# Patient Record
Sex: Male | Born: 1940
Health system: Southern US, Community
[De-identification: ages and names within clinical notes are randomized; demographics above are authoritative.]

## PROBLEM LIST (undated history)

## (undated) DIAGNOSIS — D126 Benign neoplasm of colon, unspecified: Secondary | ICD-10-CM

## (undated) DIAGNOSIS — K5792 Diverticulitis of intestine, part unspecified, without perforation or abscess without bleeding: Secondary | ICD-10-CM

## (undated) DIAGNOSIS — J449 Chronic obstructive pulmonary disease, unspecified: Secondary | ICD-10-CM

## (undated) DIAGNOSIS — J8410 Pulmonary fibrosis, unspecified: Secondary | ICD-10-CM

## (undated) DIAGNOSIS — Z860100 Personal history of colon polyps, unspecified: Secondary | ICD-10-CM

## (undated) DIAGNOSIS — T4145XA Adverse effect of unspecified anesthetic, initial encounter: Secondary | ICD-10-CM

## (undated) DIAGNOSIS — A0472 Enterocolitis due to Clostridium difficile, not specified as recurrent: Secondary | ICD-10-CM

## (undated) DIAGNOSIS — K449 Diaphragmatic hernia without obstruction or gangrene: Secondary | ICD-10-CM

## (undated) DIAGNOSIS — K589 Irritable bowel syndrome without diarrhea: Secondary | ICD-10-CM

## (undated) DIAGNOSIS — I340 Nonrheumatic mitral (valve) insufficiency: Secondary | ICD-10-CM

## (undated) DIAGNOSIS — N400 Enlarged prostate without lower urinary tract symptoms: Secondary | ICD-10-CM

## (undated) DIAGNOSIS — H269 Unspecified cataract: Secondary | ICD-10-CM

## (undated) DIAGNOSIS — T7840XA Allergy, unspecified, initial encounter: Secondary | ICD-10-CM

## (undated) DIAGNOSIS — I714 Abdominal aortic aneurysm, without rupture, unspecified: Secondary | ICD-10-CM

## (undated) DIAGNOSIS — M1990 Unspecified osteoarthritis, unspecified site: Secondary | ICD-10-CM

## (undated) DIAGNOSIS — M109 Gout, unspecified: Secondary | ICD-10-CM

## (undated) DIAGNOSIS — E785 Hyperlipidemia, unspecified: Secondary | ICD-10-CM

## (undated) DIAGNOSIS — T8859XA Other complications of anesthesia, initial encounter: Secondary | ICD-10-CM

## (undated) DIAGNOSIS — K219 Gastro-esophageal reflux disease without esophagitis: Secondary | ICD-10-CM

## (undated) DIAGNOSIS — K648 Other hemorrhoids: Secondary | ICD-10-CM

## (undated) DIAGNOSIS — K5790 Diverticulosis of intestine, part unspecified, without perforation or abscess without bleeding: Secondary | ICD-10-CM

## (undated) DIAGNOSIS — K222 Esophageal obstruction: Secondary | ICD-10-CM

## (undated) HISTORY — DX: Benign neoplasm of colon, unspecified: D12.6

## (undated) HISTORY — DX: Other hemorrhoids: K64.8

## (undated) HISTORY — PX: COLONOSCOPY: SHX174

## (undated) HISTORY — DX: Diverticulitis of intestine, part unspecified, without perforation or abscess without bleeding: K57.92

## (undated) HISTORY — DX: Hyperlipidemia, unspecified: E78.5

## (undated) HISTORY — DX: Allergy, unspecified, initial encounter: T78.40XA

## (undated) HISTORY — DX: Enterocolitis due to Clostridium difficile, not specified as recurrent: A04.72

## (undated) HISTORY — DX: Esophageal obstruction: K22.2

## (undated) HISTORY — DX: Unspecified osteoarthritis, unspecified site: M19.90

## (undated) HISTORY — DX: Abdominal aortic aneurysm, without rupture: I71.4

## (undated) HISTORY — DX: Personal history of colonic polyps: Z86.010

## (undated) HISTORY — DX: Personal history of colon polyps, unspecified: Z86.0100

## (undated) HISTORY — DX: Gout, unspecified: M10.9

## (undated) HISTORY — DX: Gastro-esophageal reflux disease without esophagitis: K21.9

## (undated) HISTORY — DX: Diaphragmatic hernia without obstruction or gangrene: K44.9

## (undated) HISTORY — PX: UPPER GASTROINTESTINAL ENDOSCOPY: SHX188

## (undated) HISTORY — DX: Other complications of anesthesia, initial encounter: T88.59XA

## (undated) HISTORY — DX: Abdominal aortic aneurysm, without rupture, unspecified: I71.40

## (undated) HISTORY — DX: Benign prostatic hyperplasia without lower urinary tract symptoms: N40.0

## (undated) HISTORY — DX: Chronic obstructive pulmonary disease, unspecified: J44.9

## (undated) HISTORY — DX: Diverticulosis of intestine, part unspecified, without perforation or abscess without bleeding: K57.90

## (undated) HISTORY — DX: Unspecified cataract: H26.9

## (undated) HISTORY — DX: Irritable bowel syndrome, unspecified: K58.9

## (undated) HISTORY — DX: Nonrheumatic mitral (valve) insufficiency: I34.0

## (undated) HISTORY — DX: Adverse effect of unspecified anesthetic, initial encounter: T41.45XA

---

## 2007-04-09 ENCOUNTER — Ambulatory Visit: Payer: Self-pay | Admitting: Gastroenterology

## 2008-09-02 ENCOUNTER — Ambulatory Visit: Payer: Self-pay | Admitting: Gastroenterology

## 2008-09-02 DIAGNOSIS — Z860100 Personal history of colon polyps, unspecified: Secondary | ICD-10-CM | POA: Insufficient documentation

## 2008-09-02 DIAGNOSIS — K589 Irritable bowel syndrome without diarrhea: Secondary | ICD-10-CM | POA: Insufficient documentation

## 2008-09-16 ENCOUNTER — Ambulatory Visit: Payer: Self-pay | Admitting: Gastroenterology

## 2008-09-16 DIAGNOSIS — K2970 Gastritis, unspecified, without bleeding: Secondary | ICD-10-CM | POA: Insufficient documentation

## 2008-09-16 DIAGNOSIS — K2990 Gastroduodenitis, unspecified, without bleeding: Secondary | ICD-10-CM

## 2008-09-16 LAB — CONVERTED CEMR LAB: UREASE: NEGATIVE

## 2008-10-14 ENCOUNTER — Ambulatory Visit: Payer: Self-pay | Admitting: Gastroenterology

## 2008-10-14 DIAGNOSIS — K222 Esophageal obstruction: Secondary | ICD-10-CM

## 2008-10-14 DIAGNOSIS — K219 Gastro-esophageal reflux disease without esophagitis: Secondary | ICD-10-CM | POA: Insufficient documentation

## 2008-11-05 ENCOUNTER — Telehealth: Payer: Self-pay | Admitting: Gastroenterology

## 2009-01-05 ENCOUNTER — Telehealth: Payer: Self-pay | Admitting: Gastroenterology

## 2009-02-02 ENCOUNTER — Telehealth: Payer: Self-pay | Admitting: Gastroenterology

## 2009-08-26 ENCOUNTER — Ambulatory Visit: Payer: Self-pay | Admitting: Family Medicine

## 2009-08-26 DIAGNOSIS — E1159 Type 2 diabetes mellitus with other circulatory complications: Secondary | ICD-10-CM | POA: Insufficient documentation

## 2009-08-26 DIAGNOSIS — E1140 Type 2 diabetes mellitus with diabetic neuropathy, unspecified: Secondary | ICD-10-CM | POA: Insufficient documentation

## 2009-08-26 DIAGNOSIS — I10 Essential (primary) hypertension: Secondary | ICD-10-CM

## 2009-08-26 DIAGNOSIS — R0989 Other specified symptoms and signs involving the circulatory and respiratory systems: Secondary | ICD-10-CM | POA: Insufficient documentation

## 2009-08-26 DIAGNOSIS — M159 Polyosteoarthritis, unspecified: Secondary | ICD-10-CM | POA: Insufficient documentation

## 2009-08-26 DIAGNOSIS — E1169 Type 2 diabetes mellitus with other specified complication: Secondary | ICD-10-CM | POA: Insufficient documentation

## 2009-08-26 DIAGNOSIS — I152 Hypertension secondary to endocrine disorders: Secondary | ICD-10-CM | POA: Insufficient documentation

## 2009-08-26 DIAGNOSIS — E7800 Pure hypercholesterolemia, unspecified: Secondary | ICD-10-CM | POA: Insufficient documentation

## 2009-10-18 ENCOUNTER — Inpatient Hospital Stay: Payer: Self-pay | Admitting: Internal Medicine

## 2009-10-18 ENCOUNTER — Ambulatory Visit: Payer: Self-pay | Admitting: Cardiology

## 2009-10-19 ENCOUNTER — Encounter: Payer: Self-pay | Admitting: Family Medicine

## 2009-10-20 ENCOUNTER — Encounter: Payer: Self-pay | Admitting: Family Medicine

## 2009-10-27 ENCOUNTER — Encounter: Payer: Self-pay | Admitting: Cardiovascular Disease

## 2009-10-27 ENCOUNTER — Encounter (INDEPENDENT_AMBULATORY_CARE_PROVIDER_SITE_OTHER): Payer: Self-pay | Admitting: *Deleted

## 2009-11-02 ENCOUNTER — Ambulatory Visit: Payer: Self-pay | Admitting: Family Medicine

## 2009-11-02 DIAGNOSIS — I2510 Atherosclerotic heart disease of native coronary artery without angina pectoris: Secondary | ICD-10-CM

## 2009-11-04 LAB — CONVERTED CEMR LAB
CO2: 26 meq/L (ref 19–32)
Calcium: 9.9 mg/dL (ref 8.4–10.5)
Chloride: 104 meq/L (ref 96–112)
Glucose, Bld: 104 mg/dL — ABNORMAL HIGH (ref 70–99)
Potassium: 5.1 meq/L (ref 3.5–5.1)
Sodium: 138 meq/L (ref 135–145)

## 2009-12-03 ENCOUNTER — Ambulatory Visit: Payer: Self-pay | Admitting: Family Medicine

## 2009-12-03 DIAGNOSIS — M109 Gout, unspecified: Secondary | ICD-10-CM

## 2009-12-03 DIAGNOSIS — M1009 Idiopathic gout, multiple sites: Secondary | ICD-10-CM | POA: Insufficient documentation

## 2009-12-03 LAB — CONVERTED CEMR LAB
Cholesterol, target level: 200 mg/dL
HDL goal, serum: 40 mg/dL
LDL Goal: 70 mg/dL

## 2010-01-25 ENCOUNTER — Ambulatory Visit: Payer: Self-pay | Admitting: Family Medicine

## 2010-01-25 LAB — CONVERTED CEMR LAB
Albumin: 4.2 g/dL (ref 3.5–5.2)
Alkaline Phosphatase: 58 units/L (ref 39–117)
BUN: 14 mg/dL (ref 6–23)
CO2: 28 meq/L (ref 19–32)
Calcium: 9.4 mg/dL (ref 8.4–10.5)
Cholesterol: 196 mg/dL (ref 0–200)
Creatinine, Ser: 1.4 mg/dL (ref 0.4–1.5)
Creatinine,U: 162.5 mg/dL
GFR calc non Af Amer: 53.31 mL/min (ref >60)
Glucose, Bld: 233 mg/dL — ABNORMAL HIGH (ref 70–99)
HDL: 39.3 mg/dL (ref >39.00)
Hgb A1c MFr Bld: 7.7 % — ABNORMAL HIGH (ref 4.6–6.5)
Microalb, Ur: 2 mg/dL — ABNORMAL HIGH (ref 0.0–1.9)
Sodium: 139 meq/L (ref 135–145)
Total Protein: 7.2 g/dL (ref 6.0–8.3)
Triglycerides: 500 mg/dL — ABNORMAL HIGH (ref 0.0–149.0)

## 2010-02-01 ENCOUNTER — Telehealth: Payer: Self-pay | Admitting: Family Medicine

## 2010-02-01 ENCOUNTER — Ambulatory Visit: Payer: Self-pay | Admitting: Family Medicine

## 2010-02-01 ENCOUNTER — Encounter: Payer: Self-pay | Admitting: Cardiology

## 2010-02-03 ENCOUNTER — Telehealth: Payer: Self-pay | Admitting: Family Medicine

## 2010-04-20 ENCOUNTER — Ambulatory Visit: Payer: Self-pay | Admitting: Family Medicine

## 2010-04-20 LAB — CONVERTED CEMR LAB: Hgb A1c MFr Bld: 7.8 % — ABNORMAL HIGH (ref 4.6–6.5)

## 2010-04-27 ENCOUNTER — Ambulatory Visit: Payer: Self-pay | Admitting: Family Medicine

## 2010-04-28 LAB — CONVERTED CEMR LAB
AST: 39 units/L — ABNORMAL HIGH (ref 0–37)
BUN: 16 mg/dL (ref 6–23)
Bilirubin, Direct: 0.2 mg/dL (ref 0.0–0.3)
Creatinine, Ser: 1.1 mg/dL (ref 0.4–1.5)
GFR calc non Af Amer: 71.12 mL/min (ref >60)
HDL: 33.2 mg/dL — ABNORMAL LOW (ref >39.00)
PSA: 1.58 ng/mL (ref 0.10–4.00)
Potassium: 4.9 meq/L (ref 3.5–5.1)
Total Bilirubin: 0.5 mg/dL (ref 0.3–1.2)
Total CHOL/HDL Ratio: 5
Uric Acid, Serum: 7.8 mg/dL (ref 4.0–7.8)
VLDL: 68.6 mg/dL — ABNORMAL HIGH (ref 0.0–40.0)

## 2010-05-04 ENCOUNTER — Telehealth: Payer: Self-pay | Admitting: Family Medicine

## 2010-05-14 ENCOUNTER — Telehealth: Payer: Self-pay | Admitting: Family Medicine

## 2010-05-17 ENCOUNTER — Telehealth: Payer: Self-pay | Admitting: Family Medicine

## 2010-08-03 ENCOUNTER — Ambulatory Visit: Payer: Self-pay | Admitting: Family Medicine

## 2010-08-04 LAB — CONVERTED CEMR LAB
AST: 42 units/L — ABNORMAL HIGH (ref 0–37)
Alkaline Phosphatase: 51 units/L (ref 39–117)
Chloride: 106 meq/L (ref 96–112)
GFR calc non Af Amer: 70.32 mL/min (ref >60)
Potassium: 4.2 meq/L (ref 3.5–5.1)
Sodium: 139 meq/L (ref 135–145)
Total Bilirubin: 0.7 mg/dL (ref 0.3–1.2)

## 2010-08-10 ENCOUNTER — Ambulatory Visit: Payer: Self-pay | Admitting: Family Medicine

## 2010-08-20 ENCOUNTER — Telehealth: Payer: Self-pay | Admitting: Family Medicine

## 2010-08-23 ENCOUNTER — Encounter (INDEPENDENT_AMBULATORY_CARE_PROVIDER_SITE_OTHER): Payer: Self-pay | Admitting: *Deleted

## 2010-08-24 ENCOUNTER — Encounter: Payer: Self-pay | Admitting: Family Medicine

## 2010-08-26 ENCOUNTER — Ambulatory Visit: Payer: Self-pay | Admitting: Gastroenterology

## 2010-08-30 LAB — PATHOLOGY REPORT

## 2010-09-04 LAB — HM DIABETES EYE EXAM: HM Diabetic Eye Exam: NORMAL

## 2010-10-05 HISTORY — PX: CORONARY ARTERY BYPASS GRAFT: SHX141

## 2010-11-04 ENCOUNTER — Ambulatory Visit: Payer: Self-pay | Admitting: Family Medicine

## 2010-11-05 LAB — CONVERTED CEMR LAB
Albumin: 4.3 g/dL (ref 3.5–5.2)
Alkaline Phosphatase: 61 units/L (ref 39–117)
Bilirubin, Direct: 0.1 mg/dL (ref 0.0–0.3)
Calcium: 9.4 mg/dL (ref 8.4–10.5)
GFR calc non Af Amer: 64.17 mL/min (ref >60)
Glucose, Bld: 95 mg/dL (ref 70–99)
HDL: 34.9 mg/dL — ABNORMAL LOW (ref >39.00)
Sodium: 137 meq/L (ref 135–145)
Total Bilirubin: 0.8 mg/dL (ref 0.3–1.2)
Total CHOL/HDL Ratio: 5
Triglycerides: 204 mg/dL — ABNORMAL HIGH (ref 0.0–149.0)
VLDL: 40.8 mg/dL — ABNORMAL HIGH (ref 0.0–40.0)

## 2010-11-12 ENCOUNTER — Ambulatory Visit: Payer: Self-pay | Admitting: Family Medicine

## 2011-01-04 NOTE — Progress Notes (Signed)
Summary: wants to change to lantus  Phone Note Call from Patient Call back at Home Phone [phone]   Caller: Mom Call For: Brian Lofts MD Summary of Call: Pt wants to change from lantus solostar to vials, this would be less costly for him.  [facility].  He is up to 50 units with the pen.  Also needs syringes with short needles. Initial call taken by: Marty Heck CMA,  May 04, 2010 2:09 PM  Follow-up for Phone Call        Phone Call Completed Follow-up by: Zenda Alpers CMA Deborra Medina),  May 04, 2010 3:32 PM    New/Updated Medications: LANTUS SOLOSTAR 100 UNIT/ML SOLN (INSULIN GLARGINE) 50 units daily..increase 2 units ever 2 days until at goal 80-120 ULTRA-THIN II INS SYR SHORT 30G X 5/16" 0.5 ML MISC (INSULIN SYRINGE-NEEDLE U-100) andsyringes Prescriptions: ULTRA-THIN II INS SYR SHORT 30G X 5/16" 0.5 ML MISC (INSULIN SYRINGE-NEEDLE U-100) andsyringes  #1 box x 11   Entered and Authorized by:   Brian Lofts MD   Signed by:   Brian Lofts MD on 05/04/2010   Method used:   Electronically to        [facility] [address]* (retail)       [place], [place]  [address]       Ph: [phone]       Fax: [phone]   RxID:   [id number] LANTUS SOLOSTAR 100 UNIT/ML SOLN (INSULIN GLARGINE) 50 units daily..increase 2 units ever 2 days until at goal 80-120  #3 vial x 3   Entered and Authorized by:   Brian Lofts MD   Signed by:   Brian Lofts MD on 05/04/2010   Method used:   Electronically to        [facility] [address] (retail)       [place], [place]  [address]       Ph: [phone]       Fax: [phone]   RxID:   [id number]

## 2011-01-04 NOTE — Assessment & Plan Note (Signed)
Summary: cpx/dlo   Vital Signs:  Patient profile:   70 year old male Height:      68 inches Weight:      187 pounds BMI:     28.54 Temp:     98.6 degrees F oral Pulse rate:   76 / minute Pulse rhythm:   regular BP sitting:   120 / 82  (left arm) Cuff size:   regular  Vitals Entered By: Sherrian Divers CMA Deborra Medina) (August 10, 2010 11:08 AM) CC: 30 minute exam, Lipid Management   History of Present Illness: DM poor control: Worsened from last OV. On metformin and lantus  daily.  FBS in AMs remain around 200. He is now up to 100 Units of insulin. Current ly not taking nighttime dose on metformin. Refuses Actos ..took in past did not like.  Gout: No further flares. Working on diet changes. Uric acid level has decreased from 8.8 to 7.8 at last OV..now stayed the same.   HTN, well controlle don current meds.  High cholesterol: Restarted simvastain, but using using every other day.Marland KitchenMarland KitchenLDL at goal <100 last OV  minimal leg pain back on med.   Scheduled for colonoscopy soon and eye exam in last few months.  Gets flu vaccine from [employer] work.    Lipid Management History:      Positive NCEP/ATP III risk factors include male age 64 years old or older, diabetes, HDL cholesterol less than 40, hypertension, and ASHD (either angina/prior MI/prior CABG).  Negative NCEP/ATP III risk factors include non-tobacco-user status.     Problems Prior to Update: 1)  Gout, Unspecified  (ICD-274.9) 2)  C A D  (ICD-414.00) 3)  Rales, Bibasilar  (ICD-786.7) 4)  Benign Prostatic Hypertrophy, Mild, Hx of  (ICD-V13.8) 5)  Osteoarthritis, Multiple Joints  (ICD-715.89) 6)  Diabetes Mellitus, Type II, Without Complications  (ION-629.52) 7)  Essential Hypertension, Benign  (ICD-401.1) 8)  Hypercholesterolemia  (ICD-272.0) 9)  Esophageal Stricture  (ICD-530.3) 10)  Gerd  (ICD-530.81) 11)  Gastritis  (ICD-535.50) 12)  Irritable Bowel Syndrome  (ICD-564.1) 13)  Personal Hx Colonic Polyps   (ICD-V12.72)  Current Medications (verified): 1)  Metformin Hcl 1000 Mg Tabs (Metformin Hcl) .... Take 1 Tablet By Mouth Two Times A Day 2)  Simvastatin 40 Mg Tabs (Simvastatin) .... One Tablet By Mouth Once Daily 3)  Diphenoxylate-Atropine 2.5-0.025 Mg Tabs (Diphenoxylate-Atropine) .[name].. 1 Tablet By Mouth Two Times A Day As Needed 4)  Omeprazole 40 Mg Cpdr (Omeprazole) .... One Tablet By Mouth Once Daily Hold Until Pt Calls. 5)  Tamsulosin Hcl 0.4 Mg Caps (Tamsulosin Hcl) .[name].. 1 Tab By Mouth Daily 6)  Metoprolol Tartrate 25 Mg Tabs (Metoprolol Tartrate) .... 1/2 Tab Twice A Day 7)  Prilosec Otc 20 Mg Tbec (Omeprazole Magnesium) .... One To Two Tablets Daily 8)  Aspirin 325 Mg Tabs (Aspirin) 9)  Lisinopril 5 Mg Tabs (Lisinopril) .[name].. 1 Tab By Mouth Daily 10)  Lantus 100 Unit/ml Soln (Insulin Glargine) .[name].. 100 Units, Titrate Up As Needed. 11)  Ultra-Thin Ii Pen Needles 29g X 12.81m Misc (Insulin Pen Needle) .... As Directed 12)  Ultra-Thin Ii Ins Syr Short 30g X 5/16" 0.5 Ml Misc (Insulin Syringe-Needle U-100) .... Andsyringes  Allergies (verified): No Known Drug Allergies  Past History:  Past medical, surgical, family and social histories (including risk factors) reviewed, and no changes noted (except as noted below).  Past Medical History: Reviewed history from 11/02/2009 and no changes required. CAD, s/p CABG x 3 Diabetes Adenomatous colon  polyps 2008 IBS Hyperlipidemia GERD Esophageal stricture  Past Surgical History: Reviewed history from 11/02/2009 and no changes required. CABG x 3, November, 2011. [facility]  Family History: Reviewed history from 10/20/2009 and no changes required. Mother-Deceased age 67. CHF Father-Deceased age 25. Ruptured Abdominal Aortic Aneurysm No FH of Colon Cancer: brother: alcoholic  Social History: Reviewed history from 08/26/2009 and no changes required. Occupation: Retired, Retail buyer as well as Administrator Patient has never  smoked.  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 Diet: fruits and veggies, fish, chicken, water  Review of Systems General:  Denies fatigue and fever. CV:  Denies chest pain or discomfort. Resp:  Denies sputum productive. GI:  Denies abdominal pain. GU:  Complains of nocturia; denies dysuria.  Physical Exam  General:  Well-developed,well-nourished,in no acute distress; alert,appropriate and cooperative throughout examination Eyes:  No corneal or conjunctival inflammation noted. EOMI. Perrla. Funduscopic exam benign, without hemorrhages, exudates or papilledema. Vision grossly normal. Ears:  External ear exam shows no significant lesions or deformities.  Otoscopic examination reveals clear canals, tympanic membranes are intact bilaterally without bulging, retraction, inflammation or discharge. Hearing is grossly normal bilaterally. Nose:  External nasal examination shows no deformity or inflammation. Nasal mucosa are pink and moist without lesions or exudates. Mouth:  Oral mucosa and oropharynx without lesions or exudates.  Teeth in good repair. Neck:  no carotid bruit or thyromegaly no cervical or supraclavicular lymphadenopathy  Lungs:  Normal respiratory effort, chest expands symmetrically. Crackles in bases..stable. Heart:  Normal rate and regular rhythm. S1 and S2 normal without gallop, murmur, click, rub or other extra sounds. Abdomen:  Bowel sounds positive,abdomen soft and non-tender without masses, organomegaly or hernias noted. Rectal:  No external abnormalities noted. Normal sphincter tone. No rectal masses or tenderness. Genitalia:  Testes bilaterally descended without nodularity, tenderness or masses. No scrotal masses or lesions. No penis lesions or urethral discharge. Prostate:  1+ enlarged, no nodules palpated.  Msk:  No deformity or scoliosis noted of thoracic or lumbar spine.   Pulses:  R and L  posterior tibial pulses are full and equal bilaterally  Extremities:  no edema   Diabetes Management Exam:    Foot Exam (with socks and/or shoes not present):       Sensory-Pinprick/Light touch:          Left medial foot (L-4): normal          Left dorsal foot (L-5): normal          Left lateral foot (S-1): normal          Right medial foot (L-4): normal          Right dorsal foot (L-5): normal          Right lateral foot (S-1): normal       Sensory-Monofilament:          Left foot: normal          Right foot: normal       Inspection:          Left foot: normal          Right foot: normal       Nails:          Left foot: normal          Right foot: normal   Impression & Recommendations:  Problem # 1:  DIABETES MELLITUS, TYPE II, WITHOUT COMPLICATIONS (ION-629.52) Poor  control...needs insulin sensitizers..increase metfromin back up  max. Pt currently refuses actos, but may reconsider  if remains elevated after max of metfromin. Also offer endo referral ..we can move forward with this if CBGs not imporivng.  His updated medication list for this problem includes:    Metformin Hcl 1000 Mg Tabs (Metformin hcl) .[name]... Take 1 tablet by mouth two times a day    Aspirin 325 Mg Tabs (Aspirin)    Lisinopril 5 Mg Tabs (Lisinopril) .[name]... 1 tab by mouth daily    Lantus 100 Unit/ml Soln (Insulin glargine) .Marland KitchenMarland KitchenMarland KitchenMarland Kitchen 100 units, titrate up as needed.  Problem # 2:  C A D (ICD-414.00) Stable. Asympotomatic.  His updated medication list for this problem includes:    Metoprolol Tartrate 25 Mg Tabs (Metoprolol tartrate) .[name]... 1/2 tab twice a day    Aspirin 325 Mg Tabs (Aspirin)    Lisinopril 5 Mg Tabs (Lisinopril) .[name]... 1 tab by mouth daily  Problem # 3:  ESSENTIAL HYPERTENSION, BENIGN (ICD-401.1)  Well controlled. Continue current medication.  His updated medication list for this problem includes:    Metoprolol Tartrate 25 Mg Tabs (Metoprolol tartrate) .[name]... 1/2 tab twice a day    Lisinopril 5 Mg  Tabs (Lisinopril) .[name]... 1 tab by mouth daily  BP today: 120/82 Prior BP: 110/60 (04/27/2010)  Prior 10 Yr Risk Heart Disease: N/A (12/03/2009)  Labs Reviewed: K+: 4.2 (08/03/2010) Creat: : 1.1 (08/03/2010)   Chol: 175 (04/20/2010)   HDL: 33.20 (04/20/2010)   TG: 343.0 (04/20/2010)  Problem # 4:  GOUT, UNSPECIFIED (ICD-274.9) Uric acid no longer declining...but no flares. He is not interested in allopurinol at this time. Understands risk of flares and renal stones.   Problem # 5:  PERSONAL HX COLONIC POLYPS (ICD-V12.72) Colonoscopy colon cancer screen scheudled by pt.   Problem # 6:  Preventive Health Care (ICD-V70.0) Assessment: Comment Only Reviewed preventive care protocols, scheduled due services, and updated immunizations.   Complete Medication List: 1)  Metformin Hcl 1000 Mg Tabs (Metformin hcl) .... Take 1 tablet by mouth two times a day 2)  Simvastatin 40 Mg Tabs (Simvastatin) .... One tablet by mouth once daily 3)  Diphenoxylate-atropine 2.5-0.025 Mg Tabs (Diphenoxylate-atropine) .[name].. 1 tablet by mouth two times a day as needed 4)  Omeprazole 40 Mg Cpdr (Omeprazole) .... One tablet by mouth once daily hold until pt calls. 5)  Tamsulosin Hcl 0.4 Mg Caps (Tamsulosin hcl) .[name].. 1 tab by mouth daily 6)  Metoprolol Tartrate 25 Mg Tabs (Metoprolol tartrate) .... 1/2 tab twice a day 7)  Prilosec Otc 20 Mg Tbec (Omeprazole magnesium) .... One to two tablets daily 8)  Aspirin 325 Mg Tabs (Aspirin) 9)  Lisinopril 5 Mg Tabs (Lisinopril) .[name].. 1 tab by mouth daily 10)  Lantus 100 Unit/ml Soln (Insulin glargine) .[name].. 100 units, titrate up as needed. 11)  Ultra-thin Ii Pen Needles 29g X 12.12m Misc (Insulin pen needle) .... As directed 12)  Ultra-thin Ii Ins Syr Short 30g X 5/16" 0.5 Ml Misc (Insulin syringe-needle u-100) .... Andsyringes  Other Orders: TD Toxoids IM 7 YR + (([address] Admin 1st Vaccine [phone]  Lipid Assessment/Plan:      Based on NCEP/ATP III, the patient's risk factor  category is "history of coronary disease, peripheral vascular disease, cerebrovascular disease, or aortic aneurysm along with either diabetes, current smoker, or LDL > 130 plus HDL < 40 plus triglycerides > 200".  The patient's lipid goals are as follows: Total cholesterol goal is 200; LDL cholesterol goal is 70;  HDL cholesterol goal is 40; Triglyceride goal is 150.    Patient Instructions: 1)  Go back to metformin two times a day as well as lantus. 2)  Call in 2 weeks if blood sugars not improving. 3)  Can consider actos or referral to ENDO at that time. 4)  Call insurance about shingles vaccine. 5)  Please schedule a follow-up appointment in 3 months .  6)  BMP prior to visit, ICD-9:  7)  Hepatic Panel prior to visit ICD-9:  8)  Lipid panel prior to visit ICD-9 :  9)  HgBA1c prior to visit  ICD-9:   Current Allergies (reviewed today): No known allergies   TD Result Date:  08/10/2010 TD Result:  given TD Next Due:  10 yr   Immunizations Administered:  Tetanus Vaccine:    Vaccine Type: Td    Site: right deltoid    Mfr: Sanofi Pasteur    Dose: 0.5 ml    Route: IM    Given by: Sherrian Divers CMA ([place])    Exp. Date: 01/06/2012    Lot #: [id number]    VIS given: 10/22/08 version given August 10, 2010.

## 2011-01-04 NOTE — Progress Notes (Signed)
Summary: regarding BP meds  Phone Note Call from Patient Call back at Home Phone [phone]   Caller: Spouse Call For: [name] MD Summary of Call: Pt is taking lisinopril 12.5 mg two times a day and lopressor 10 mg daily ( chart says 5 mg's daily) and his wife is asking if he is supposed to be on both, and if so, is he supposed to take 5 or 10 mg's of lopressor. Initial call taken by: Marty Heck CMA,  May 14, 2010 7:56 AM  Follow-up for Phone Call        His blood pressure was well controlled the last time he was here.  HAve her recheck...they don't make lisinopril 12.5 mg.  Needs to be on meds as on med list...bot lisinoprila nd lopressor. Follow Ps closely and call with measurments in 1 week.  Follow-up by: [name] MD,  May 14, 2010 9:13 AM  Additional Follow-up for Phone Call Additional follow up Details #1::        Patients wife advised of instructions.Cathlean Cower CMA  Additional Follow-up by: Zenda Alpers CMA ([place]),  May 14, 2010 9:30 AM     Appended Document: Orders Update BPs measured for a week...running 96-115/60-70...Marland Kitchenon lisinopril 10 mg daily and 12.5 mg two times a day of metoprolol.  Will dercrease to lisinopril 5 mg daily (this is what we thought he was on anyway)   Clinical Lists Changes  Medications: Rx of METOPROLOL TARTRATE 25 MG TABS (METOPROLOL TARTRATE) 1/2 tab twice a day;  #90 x 3;  Signed;  Entered by: [name] MD;  Authorized by: [name] MD;  Method used: Faxed to [facility], [place], [place]  [id number], Ph: [phone], Fax: [phone] Rx of LISINOPRIL 5 MG TABS (LISINOPRIL) 1 tab by mouth daily;  #90 x 3;  Signed;  Entered by: [name] MD;  Authorized by: [name] MD;  Method used: Faxed to [facility], [place], [place]  [id number], Ph: [phone], Fax: [phone]    Prescriptions: LISINOPRIL 5 MG TABS (LISINOPRIL) 1 tab by mouth daily  #90 x 3  Entered and Authorized by:   [name] MD   Signed by:   [name] MD on 05/21/2010   Method used:   Faxed to ...       [facility] (mail-order)       [place], [place]  [id number]       Ph: [phone]       Fax: [phone]   RxID:   [id number] METOPROLOL TARTRATE 25 MG TABS (METOPROLOL TARTRATE) 1/2 tab twice a day  #90 x 3   Entered and Authorized by:   [name] MD   Signed by:   [name] MD on 05/21/2010   Method used:   Faxed to ...       [facility] (mail-order)       [address] [place], [place]  [id number]       Ph: [phone]       Fax: [phone]   RxID:   [id number]

## 2011-01-04 NOTE — Progress Notes (Signed)
Summary: note for dental work  Phone Note Call from Patient   Caller: Spouse Call For: [name] MD Summary of Call: Patient needs letter stating that it is okay for him for have some dental work done. wife says patient was her a couple of weeks ago. Does he need another appt. Initial call taken by: Zenda Alpers CMA Deborra Medina),  August 20, 2010 9:25 AM  Follow-up for Phone Call        No appt neede..write letter as requested for my review and edit. THX.  Follow-up by: [name] MD,  August 20, 2010 12:29 PM  Additional Follow-up for Phone Call Additional follow up Details #1::        in inbox Additional Follow-up by: Zenda Alpers CMA Deborra Medina),  August 23, 2010 9:24 AM    Additional Follow-up for Phone Call Additional follow up Details #2::    Edited.Marland Kitchenon printer will sign.  Follow-up by: [name] MD,  August 24, 2010 8:22 AM  Additional Follow-up for Phone Call Additional follow up Details #3:: Details for Additional Follow-up Action Taken: pATIENT ADVISED LETTER READY FOR PICK UP VIA MESSAGE ON MACHINE Additional Follow-up by: Zenda Alpers CMA Deborra Medina),  August 24, 2010 8:44 AM

## 2011-01-04 NOTE — Progress Notes (Signed)
Summary:  OMEPRAZOLE  Phone Note Refill Request Message from:  Patient on February 03, 2010 4:19 PM  Refills Requested: Medication #1:  OMEPRAZOLE 40 MG CPDR one tablet by mouth once daily Patient [facility] to [facility] [phone]) Patient's humana ID is [id number]. Rx also needs to say hold until patient calls.  Initial call taken by: Lacretia Nicks,  February 03, 2010 4:36 PM Caller: Patient Call For: [name] MD    New/Updated Medications: OMEPRAZOLE 40 MG CPDR (OMEPRAZOLE) one tablet by mouth once daily HOLD until pt calls. Prescriptions: OMEPRAZOLE 40 MG CPDR (OMEPRAZOLE) one tablet by mouth once daily HOLD until pt calls.  #90 x 3   Entered and Authorized by:   [name] MD   Signed by:   [name] MD on 02/03/2010   Method used:   Faxed to ...       [facility] (mail-order)       [address] [place], [place]  [id number]       Ph: [phone]       Fax: [phone]   RxID:   [id number]

## 2011-01-04 NOTE — Letter (Signed)
Summary: [facility]  [facility]   Imported By: Zenovia Jarred 02/02/2010 09:29:37  _____________________________________________________________________  External Attachment:    Type:   Image     Comment:   External Document

## 2011-01-04 NOTE — Letter (Signed)
Summary: Generic Letter  [place] at [facility]  [address] [place], [place] [address]   Phone: [phone]  Fax: [phone]    08/23/2010  [place], [place]  [address]  To Whom it my Concern:  The above named patient is medically stable to undergo, the dental procedure that he is scheduled for.    Sincerely,   Zenda Alpers CMA ([place])

## 2011-01-04 NOTE — Letter (Signed)
Summary: Generic Letter  [place] at [facility]  [address] [place], [place] [address]   Phone: [phone]  Fax: [phone]    08/23/2010  [place], [place]  [address]  To Whom it my Concern:  The above named patient is medically stable to undergo the scheduled dental procedure. See attached for medication and health problem list. Please call if you have any questions.  Sincerely,    [name] MD

## 2011-01-04 NOTE — Assessment & Plan Note (Signed)
Summary: 3 M F/U DLO   Vital Signs:  Patient profile:   70 year old male Height:      68 inches Weight:      188.0 pounds BMI:     28.69 Temp:     98.2 degrees F oral Pulse rate:   76 / minute Pulse rhythm:   regular BP sitting:   120 / 76  (left arm) Cuff size:   regular  Vitals Entered By: Zenda Alpers CMA Deborra Medina) (November 12, 2010 11:02 AM)  History of Present Illness: Chief complaint 3 month follow up  DM poor control: Mild improvement from last OV .Brian Kitchennow taking both doses of metformin. On metformin and lantus  daily.  FBS in AMs remain around 130s. He is now up to 100 Units of insulin...all in one dose.  Before dinner 150.Brian Kitchendoes not usuall check after meals. Refuses Actos ..took in past did not like.  Gout: No further flares. Working on diet changes. Uric acid level has decreased from 8.8 to 7.8 at last OV..now stayed the same.   HTN, well controlled on current meds.  High cholesterol: Restarted simvastain, but using only once a week.Brian KitchenMarland KitchenLDL almost at goal. Triglycerides continuing to drop. Minimal leg pain back on med.   CAD, asymptomatic.   Lipid Management History:      Positive NCEP/ATP III risk factors include male age 22 years old or older, diabetes, HDL cholesterol less than 40, hypertension, and ASHD (either angina/prior MI/prior CABG).  Negative NCEP/ATP III risk factors include non-tobacco-user status.      Problems Prior to Update: 1)  Gout, Unspecified  (ICD-274.9) 2)  C A D  (ICD-414.00) 3)  Rales, Bibasilar  (ICD-786.7) 4)  Benign Prostatic Hypertrophy, Mild, Hx of  (ICD-V13.8) 5)  Osteoarthritis, Multiple Joints  (ICD-715.89) 6)  Diabetes Mellitus, Type II, Without Complications  (OIZ-124.58) 7)  Essential Hypertension, Benign  (ICD-401.1) 8)  Hypercholesterolemia  (ICD-272.0) 9)  Esophageal Stricture  (ICD-530.3) 10)  Gerd  (ICD-530.81) 11)  Gastritis  (ICD-535.50) 12)  Irritable Bowel Syndrome  (ICD-564.1) 13)  Personal Hx Colonic Polyps   (ICD-V12.72)  Allergies (verified): No Known Drug Allergies  Past History:  Past medical, surgical, family and social histories (including risk factors) reviewed, and no changes noted (except as noted below).  Past Medical History: Reviewed history from 11/02/2009 and no changes required. CAD, s/p CABG x 3 Diabetes Adenomatous colon polyps 2008 IBS Hyperlipidemia GERD Esophageal stricture  Past Surgical History: Reviewed history from 11/02/2009 and no changes required. CABG x 3, November, 2011. [facility]  Family History: Reviewed history from 10/20/2009 and no changes required. Mother-Deceased age 29. CHF Father-Deceased age 52. Ruptured Abdominal Aortic Aneurysm No FH of Colon Cancer: brother: alcoholic  Social History: Reviewed history from 08/26/2009 and no changes required. 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 Diet: fruits and veggies, fish, chicken, water  Review of Systems General:  Denies fatigue and fever. CV:  Denies chest pain or discomfort. Resp:  Denies shortness of breath. GI:  Denies abdominal pain and bloody stools. GU:  Denies dysuria; some stable weak stream.. still taking flomax.  Physical Exam  General:  Well-developed,well-nourished,in no acute distress; alert,appropriate and cooperative throughout examination Mouth:  Oral mucosa and oropharynx without lesions or exudates.  Teeth in good repair. Neck:  no carotid bruit or thyromegaly no  cervical or supraclavicular lymphadenopathy  Lungs:  Normal respiratory effort, chest expands symmetrically. Crackles in bases..stable. Heart:  Normal rate and regular rhythm. S1 and S2 normal without gallop, murmur, click, rub or other extra sounds. Abdomen:  Bowel sounds positive,abdomen soft and non-tender without masses, organomegaly or hernias  noted. Pulses:  R and L posterior tibial pulses are full and equal bilaterally  Extremities:  no edema   Diabetes Management Exam:    Foot Exam (with socks and/or shoes not present):       Sensory-Pinprick/Light touch:          Left medial foot (L-4): normal          Left dorsal foot (L-5): normal          Left lateral foot (S-1): normal          Right medial foot (L-4): normal          Right dorsal foot (L-5): normal          Right lateral foot (S-1): normal       Sensory-Monofilament:          Left foot: normal          Right foot: normal       Inspection:          Left foot: normal          Right foot: normal       Nails:          Left foot: normal          Right foot: normal    Eye Exam:       Eye Exam done elsewhere          Date: 09/04/2010          Results: nml          Done by: eye MD   Impression & Recommendations:  Problem # 1:  DIABETES MELLITUS, TYPE II, WITHOUT COMPLICATIONS (BOF-751.02) Assessment Improved  Not at goal. Split insulin dose. Continue metformin. Get on track with exercsie and diet changes.  His updated medication list for this problem includes:    Metformin Hcl 1000 Mg Tabs (Metformin hcl) .Brian Kitchen... Take 1 tablet by mouth two times a day    Aspirin 325 Mg Tabs (Aspirin)    Lisinopril 5 Mg Tabs (Lisinopril) .Brian Kitchen... 1 tab by mouth daily    Lantus 100 Unit/ml Soln (Insulin glargine) .Brian KitchenMarland KitchenMarland KitchenMarland Kitchen 100 units, titrate up as needed.  Labs Reviewed: Creat: 1.2 (11/04/2010)     Last Eye Exam: nml (09/04/2010) Reviewed HgBA1c results: 8.0 (11/04/2010)  8.7 (08/03/2010)  Problem # 2:  ESSENTIAL HYPERTENSION, BENIGN (ICD-401.1)  Well controlled. Continue current medication.  His updated medication list for this problem includes:    Metoprolol Tartrate 25 Mg Tabs (Metoprolol tartrate) .Brian Kitchen... 1/2 tab twice a day    Lisinopril 5 Mg Tabs (Lisinopril) .Brian Kitchen... 1 tab by mouth daily  BP today: 120/76 Prior BP: 120/82 (08/10/2010)  Prior 10 Yr Risk Heart Disease: N/A  (12/03/2009)  Labs Reviewed: K+: 4.7 (11/04/2010) Creat: : 1.2 (11/04/2010)   Chol: 176 (11/04/2010)   HDL: 34.90 (11/04/2010)   TG: 204.0 (11/04/2010)  Problem # 3:  HYPERCHOLESTEROLEMIA (ICD-272.0)  LDL not at goal <70.. take statin daily if able.  Fish oil daily.  His updated medication list for this problem includes:    Simvastatin 40 Mg Tabs (Simvastatin) ..... One tablet by mouth once daily  Labs Reviewed: SGOT: 34 (11/04/2010)   SGPT: 37 (11/04/2010)  Lipid Goals: Chol Goal: 200 (12/03/2009)   HDL Goal: 40 (12/03/2009)   LDL Goal: 70 (12/03/2009)   TG Goal: 150 (12/03/2009)  Prior 10 Yr Risk Heart Disease: N/A (12/03/2009)   HDL:34.90 (11/04/2010), 33.20 (04/20/2010)  Chol:176 (11/04/2010), 175 (04/20/2010)  Trig:204.0 (11/04/2010), 343.0 (04/20/2010)  Problem # 4:  C A D (ICD-414.00) Asymptomatic.  His updated medication list for this problem includes:    Metoprolol Tartrate 25 Mg Tabs (Metoprolol tartrate) .Brian Kitchen... 1/2 tab twice a day    Aspirin 325 Mg Tabs (Aspirin)    Lisinopril 5 Mg Tabs (Lisinopril) .Brian Kitchen... 1 tab by mouth daily  Problem # 5:  GOUT, UNSPECIFIED (ICD-274.9) Assessment: Unchanged Asymptomatic.   Complete Medication List: 1)  Metformin Hcl 1000 Mg Tabs (Metformin hcl) .... Take 1 tablet by mouth two times a day 2)  Simvastatin 40 Mg Tabs (Simvastatin) .... One tablet by mouth once daily 3)  Diphenoxylate-atropine 2.5-0.025 Mg Tabs (Diphenoxylate-atropine) .Brian Kitchen.. 1 tablet by mouth two times a day as needed 4)  Omeprazole 40 Mg Cpdr (Omeprazole) .... One tablet by mouth once daily hold until pt calls. 5)  Tamsulosin Hcl 0.4 Mg Caps (Tamsulosin hcl) .Brian Kitchen.. 1 tab by mouth daily 6)  Metoprolol Tartrate 25 Mg Tabs (Metoprolol tartrate) .... 1/2 tab twice a day 7)  Prilosec Otc 20 Mg Tbec (Omeprazole magnesium) .... One to two tablets daily 8)  Aspirin 325 Mg Tabs (Aspirin) 9)  Lisinopril 5 Mg Tabs (Lisinopril) .Brian Kitchen.. 1 tab by mouth daily 10)  Lantus 100 Unit/ml Soln  (Insulin glargine) .Brian Kitchen.. 100 units, titrate up as needed. 11)  Ultra-thin Ii Pen Needles 29g X 12.72m Misc (Insulin pen needle) .... As directed 12)  Ultra-thin Ii Ins Syr Short 30g X 5/16" 0.5 Ml Misc (Insulin syringe-needle u-100) .... Andsyringes  Lipid Assessment/Plan:      Based on NCEP/ATP III, the patient's risk factor category is "history of coronary disease, peripheral vascular disease, cerebrovascular disease, or aortic aneurysm along with either diabetes, current smoker, or LDL > 130 plus HDL < 40 plus triglycerides > 200".  The patient's lipid goals are as follows: Total cholesterol goal is 200; LDL cholesterol goal is 70; HDL cholesterol goal is 40; Triglyceride goal is 150.  His LDL cholesterol goal has not been met.    Patient Instructions: 1)  Spilt lantus dose to 50 Units two times a day instead of all at once. 2)   Take simvastatin every day.  3)   Check instead 2 hours after meals as well as fasting blood sugars.. bring in measurements to next appt. 4)   Increase exercise..Brian Kitchen-5 times a week. 5)  Please schedule a follow-up appointment in 3 months .  6)   HgBA1c prior to visit  ICD-9: 250.00 7)  Lipid panel prior to visit ICD-9 :    Orders Added: 1)  Est. Patient Level IV [[95747]   Current Allergies (reviewed today): No known allergies   Flu Vaccine Result Date:  11/12/2010 Flu Vaccine Result:  given Flu Vaccine Next Due:  1 yr Herpes Zoster Next Due:  Refused

## 2011-01-04 NOTE — Assessment & Plan Note (Signed)
Summary: 3 M F/U 30 PER MD/DLO   Vital Signs:  Patient profile:   70 year old male Height:      68 inches Weight:      185.8 pounds BMI:     28.35 Temp:     97.8 degrees F oral Pulse rate:   80 / minute Pulse rhythm:   regular BP sitting:   110 / 60  (left arm) Cuff size:   regular  Vitals Entered By: Zenda Alpers CMA Deborra Medina) (Apr 27, 2010 9:12 AM)  History of Present Illness: Chief complaint 3 month follow up   DM, inadequate control: Recent insulin start ...continues metformin and glipizide. Currently using lantus  40 UNITS Fasting blood sugars 170-220.... 2 hours after meals  220. Rare low CBGs if skips a meal.  High cholesterol: Restarted simvastain, but using using every other day.Marland KitchenMarland KitchenLDL now at goal <100 minimal leg pain back on med.   Gout..has made diet changes to lower uric acid. Uric acid has dropped one point.   Problems Prior to Update: 1)  Gout, Unspecified  (ICD-274.9) 2)  C A D  (ICD-414.00) 3)  Rales, Bibasilar  (ICD-786.7) 4)  Benign Prostatic Hypertrophy, Mild, Hx of  (ICD-V13.8) 5)  Osteoarthritis, Multiple Joints  (ICD-715.89) 6)  Diabetes Mellitus, Type II, Without Complications  (TLX-726.20) 7)  Essential Hypertension, Benign  (ICD-401.1) 8)  Hypercholesterolemia  (ICD-272.0) 9)  Abdominal Pain-epigastric  (ICD-789.06) 10)  Esophageal Stricture  (ICD-530.3) 11)  Gerd  (ICD-530.81) 12)  Gastritis  (ICD-535.50) 13)  Irritable Bowel Syndrome  (ICD-564.1) 14)  Personal Hx Colonic Polyps  (ICD-V12.72) 15)  Other Dysphagia  (ICD-787.29) 16)  Abdominal Pain, Epigastric  (ICD-789.06)  Current Medications (verified): 1)  Metformin Hcl 1000 Mg Tabs (Metformin Hcl) .... Take 1 Tablet By Mouth Two Times A Day 2)  Simvastatin 40 Mg Tabs (Simvastatin) .... One Tablet By Mouth Once Daily 3)  Diphenoxylate-Atropine 2.5-0.025 Mg Tabs (Diphenoxylate-Atropine) .[name].. 1 Tablet By Mouth Two Times A Day As Needed 4)  Omeprazole 40 Mg Cpdr (Omeprazole) .... One  Tablet By Mouth Once Daily Hold Until Pt Calls. 5)  Tamsulosin Hcl 0.4 Mg Caps (Tamsulosin Hcl) .[name].. 1 Tab By Mouth Daily 6)  Metoprolol Tartrate 25 Mg Tabs (Metoprolol Tartrate) .... 1/2 Tab Twice A Day 7)  Prilosec Otc 20 Mg Tbec (Omeprazole Magnesium) .... One To Two Tablets Daily 8)  Aspirin 325 Mg Tabs (Aspirin) 9)  Lisinopril 5 Mg Tabs (Lisinopril) .[name].. 1 Tab By Mouth Daily 10)  Lantus Solostar 100 Unit/ml Soln (Insulin Glargine) .... 40 Units Daily..increase 2 Units Ever 2 Days Until At Goal 80-120 11)  Ultra-Thin Ii Pen Needles 29g X 12.23m Misc (Insulin Pen Needle) .... As Directed  Allergies (verified): No Known Drug Allergies  Past History:  Past medical, surgical, family and social histories (including risk factors) reviewed, and no changes noted (except as noted below).  Past Medical History: Reviewed history from 11/02/2009 and no changes required. CAD, s/p CABG x 3 Diabetes Adenomatous colon polyps 2008 IBS Hyperlipidemia GERD Esophageal stricture  Past Surgical History: Reviewed history from 11/02/2009 and no changes required. [facility] Family History: Reviewed history from 10/20/2009 and no changes required. Mother-Deceased age 71 CHF Father-Deceased age 71 Ruptured Abdominal Aortic Aneurysm No FH of Colon Cancer: brother: alcoholic  Social History: Reviewed history from 08/26/2009 and no changes required. 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 Diet: fruits and veggies, fish, chicken, water  Review of Systems General:  Denies fatigue and fever. CV:  Denies chest pain or discomfort. Resp:  Denies shortness of breath. GI:  Denies abdominal pain. GU:  Denies dysuria.  Physical Exam  General:  Well-developed,well-nourished,in no acute distress; alert,appropriate  and cooperative throughout examination Mouth:  Oral mucosa and oropharynx without lesions or exudates.  Teeth in good repair. Neck:  no carotid bruit or thyromegaly no cervical or supraclavicular lymphadenopathy  Lungs:  Normal respiratory effort, chest expands symmetrically. Crackles in bases..stable. Heart:  Normal rate and regular rhythm. S1 and S2 normal without gallop, murmur, click, rub or other extra sounds. Abdomen:  Bowel sounds positive,abdomen soft and non-tender without masses, organomegaly or hernias noted. Pulses:  R and L posterior tibial pulses are full and equal bilaterally  Extremities:  no edema, well healing left vien harvest slight redness and tenderness at left dorsal foot, no MCP pain 1st digit  Diabetes Management Exam:    Foot Exam (with socks and/or shoes not present):       Sensory-Pinprick/Light touch:          Left medial foot (L-4): normal          Left dorsal foot (L-5): normal          Left lateral foot (S-1): normal          Right medial foot (L-4): normal          Right dorsal foot (L-5): normal          Right lateral foot (S-1): normal       Sensory-Monofilament:          Left foot: normal          Right foot: normal       Inspection:          Left foot: normal          Right foot: normal       Nails:          Left foot: normal          Right foot: normal   Impression & Recommendations:  Problem # 1:  DIABETES MELLITUS, TYPE II, WITHOUT COMPLICATIONS (QIO-962.95)  Minimal improvement..significant insulin resistance..continue to titrate up further. MAy consider treating with levemir if conti nued minimal response.  Also needs to add exercsie to increase insulin sensitivity.  The following medications were removed from the medication list:    Glipizide 10 Mg Tabs (Glipizide) .[name]... 2 tablet by mouth two times a day His updated medication list for this problem includes:    Metformin Hcl 1000 Mg Tabs (Metformin hcl) .[name]... Take 1 tablet by mouth two  times a day    Aspirin 325 Mg Tabs (Aspirin)    Lisinopril 5 Mg Tabs (Lisinopril) .[name]... 1 tab by mouth daily    Lantus Solostar 100 Unit/ml Soln (Insulin glargine) .Marland KitchenMarland KitchenMarland KitchenMarland Kitchen 40 units daily..increase 2 units ever 2 days until at goal 80-120  Labs Reviewed: Creat: 1.4 (01/25/2010)     Last Eye Exam: normal (12/05/2008) Reviewed HgBA1c results: 7.8 (04/20/2010)  7.7 (01/25/2010)  Problem # 2:  HYPERCHOLESTEROLEMIA (ICD-272.0)  Improved control but not at goal LDL ,70..take simvastain every day. Recheck fasting LIPIDS, AST, ALT  in 3 months Dx 272.0    His updated medication list for this problem includes:    Simvastatin 40 Mg Tabs (Simvastatin) .Marland KitchenMarland KitchenMarland KitchenMarland Kitchen  One tablet by mouth once daily  Labs Reviewed: SGOT: 35 (01/25/2010)   SGPT: 46 (01/25/2010)  Lipid Goals: Chol Goal: 200 (12/03/2009)   HDL Goal: 40 (12/03/2009)   LDL Goal: 70 (12/03/2009)   TG Goal: 150 (12/03/2009)  Prior 10 Yr Risk Heart Disease: N/A (12/03/2009)   HDL:39.30 (01/25/2010)  Chol:196 (01/25/2010)  Trig:500.0 (01/25/2010)  Problem # 3:  ESSENTIAL HYPERTENSION, BENIGN (ICD-401.1) Well controlled. Continue current medication.  His updated medication list for this problem includes:    Metoprolol Tartrate 25 Mg Tabs (Metoprolol tartrate) .[name]... 1/2 tab twice a day    Lisinopril 5 Mg Tabs (Lisinopril) .[name]... 1 tab by mouth daily  BP today: 110/60 Prior BP: 130/80 (02/01/2010)  Prior 10 Yr Risk Heart Disease: N/A (12/03/2009)  Labs Reviewed: K+: 4.8 (01/25/2010) Creat: : 1.4 (01/25/2010)   Chol: 196 (01/25/2010)   HDL: 39.30 (01/25/2010)   TG: 500.0 (01/25/2010)  Problem # 4:  GOUT, UNSPECIFIED (ICD-274.9) Improved control with diet changes. Recheck in 3 months..if remains above 7 ..start allopurinol.   Complete Medication List: 1)  Metformin Hcl 1000 Mg Tabs (Metformin hcl) .... Take 1 tablet by mouth two times a day 2)  Simvastatin 40 Mg Tabs (Simvastatin) .... One tablet by mouth once daily 3)  Diphenoxylate-atropine  2.5-0.025 Mg Tabs (Diphenoxylate-atropine) .[name].. 1 tablet by mouth two times a day as needed 4)  Omeprazole 40 Mg Cpdr (Omeprazole) .... One tablet by mouth once daily hold until pt calls. 5)  Tamsulosin Hcl 0.4 Mg Caps (Tamsulosin hcl) .[name].. 1 tab by mouth daily 6)  Metoprolol Tartrate 25 Mg Tabs (Metoprolol tartrate) .... 1/2 tab twice a day 7)  Prilosec Otc 20 Mg Tbec (Omeprazole magnesium) .... One to two tablets daily 8)  Aspirin 325 Mg Tabs (Aspirin) 9)  Lisinopril 5 Mg Tabs (Lisinopril) .[name].. 1 tab by mouth daily 10)  Lantus Solostar 100 Unit/ml Soln (Insulin glargine) .... 40 units daily..increase 2 units ever 2 days until at goal 80-120 11)  Ultra-thin Ii Pen Needles 29g X 12.7m Misc (Insulin pen needle) .... As directed  Patient Instructions: 1)  Make sure to take simvastatin EVERY DAY. 2)  Continue to titrate up Lantus 2 Units every 2 days...until at about 60 Units..Marland Kitchenf no improvement 3)  Please schedule a follow-up appointment in 3 months, CPX.  4)  BMP prior to visit, ICD-9: 250.00 5)  Hepatic Panel prior to visit ICD-9:  6)  HgBA1c prior to visit  ICD-9:  7)  Uric acid Dx 274.9  Current Allergies (reviewed today): No known allergies

## 2011-01-04 NOTE — Assessment & Plan Note (Signed)
Summary: 2 M F/U DLO   Vital Signs:  Patient profile:   70 year old male Height:      68 inches Weight:      191.2 pounds BMI:     29.18 Temp:     98.1 degrees F oral Pulse rate:   80 / minute Pulse rhythm:   regular BP sitting:   130 / 80  (left arm) Cuff size:   regular  Vitals Entered By: Zenda Alpers CMA Deborra Medina) (February 01, 2010 11:07 AM)  History of Present Illness: Chief complaint 2 month follow up  DM, poor control: Stopped actos given concern of SE. On metfromin and glipized max. Fasting blood sugars 200-220.. 2 hours after meals  120.  High cholesterol: Stopped simvastatin x 3 months 40 mg daily due to right thigh pain. Willing to restart.   Resolved foot pain due to gout.  Problems Prior to Update: 1)  Gout, Unspecified  (ICD-274.9) 2)  C A D  (ICD-414.00) 3)  Rales, Bibasilar  (ICD-786.7) 4)  Benign Prostatic Hypertrophy, Mild, Hx of  (ICD-V13.8) 5)  Osteoarthritis, Multiple Joints  (ICD-715.89) 6)  Diabetes Mellitus, Type II, Without Complications  (HUD-149.70) 7)  Essential Hypertension, Benign  (ICD-401.1) 8)  Hypercholesterolemia  (ICD-272.0) 9)  Abdominal Pain-epigastric  (ICD-789.06) 10)  Esophageal Stricture  (ICD-530.3) 11)  Gerd  (ICD-530.81) 12)  Gastritis  (ICD-535.50) 13)  Irritable Bowel Syndrome  (ICD-564.1) 14)  Personal Hx Colonic Polyps  (ICD-V12.72) 15)  Other Dysphagia  (ICD-787.29) 16)  Abdominal Pain, Epigastric  (ICD-789.06)  Current Medications (verified): 1)  Metformin Hcl 1000 Mg Tabs (Metformin Hcl) .... Take 1 Tablet By Mouth Two Times A Day 2)  Simvastatin 40 Mg Tabs (Simvastatin) .... One Tablet By Mouth Once Daily 3)  Glipizide 10 Mg Tabs (Glipizide) .... 2 Tablet By Mouth Two Times A Day 4)  Diphenoxylate-Atropine 2.5-0.025 Mg Tabs (Diphenoxylate-Atropine) .[name].. 1 Tablet By Mouth Two Times A Day As Needed 5)  Omeprazole 40 Mg Cpdr (Omeprazole) .... One Tablet By Mouth Once Daily 6)  Tamsulosin Hcl 0.4 Mg Caps  (Tamsulosin Hcl) .[name].. 1 Tab By Mouth Daily 7)  Metoprolol Tartrate 25 Mg Tabs (Metoprolol Tartrate) .... 1/2 Tab Twice A Day 8)  Prilosec Otc 20 Mg Tbec (Omeprazole Magnesium) .... One To Two Tablets Daily 9)  Aspirin 325 Mg Tabs (Aspirin) 10)  Lisinopril 5 Mg Tabs (Lisinopril) .[name].. 1 Tab By Mouth Daily 11)  Lantus Solostar 100 Unit/ml Soln (Insulin Glargine) .[name].. 10 Units Daily..increase 2 Units Ever 2 Days Until At Goal 80-120 12)  Ultra-Thin Ii Pen Needles 29g X 12.27m Misc (Insulin Pen Needle) .... As Directed  Allergies (verified): No Known Drug Allergies  Past History:  Past medical, surgical, family and social histories (including risk factors) reviewed, and no changes noted (except as noted below).  Past Medical History: Reviewed history from 11/02/2009 and no changes required. CAD, s/p CABG x 3 Diabetes Adenomatous colon polyps 2008 IBS Hyperlipidemia GERD Esophageal stricture  Past Surgical History: Reviewed history from 11/02/2009 and no changes required. [facility]: Reviewed history from 10/20/2009 and no changes required. Mother-Deceased age 71 CHF Father-Deceased age 71 Ruptured Abdominal Aortic Aneurysm No FH of Colon Cancer: brother: alcoholic  Social History: Reviewed history from 08/26/2009 and no changes required. 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 Diet: fruits and veggies, fish, chicken, water  Review of Systems General:  Denies fatigue and fever. CV:  Denies chest pain or discomfort. Resp:  Denies shortness of breath. GI:  Denies abdominal pain. GU:  Denies dysuria.  Physical Exam  General:  overweight appearing male in NAD Mouth:  MMM Neck:  no carotid bruit or thyromegaly no cervical or supraclavicular lymphadenopathy  Lungs:   Normal respiratory effort, chest expands symmetrically. Crackles in bases..stable. Heart:  Normal rate and regular rhythm. S1 and S2 normal without gallop, murmur, click, rub or other extra sounds. Pulses:  R and L posterior tibial pulses are full and equal bilaterally  Extremities:  no edema, well healing left vien harvest slight redness and tenderness at left dorsal foot, no MCP pain 1st digit  Diabetes Management Exam:    Foot Exam (with socks and/or shoes not present):       Sensory-Pinprick/Light touch:          Left medial foot (L-4): normal          Left dorsal foot (L-5): normal          Left lateral foot (S-1): normal          Right medial foot (L-4): normal          Right dorsal foot (L-5): normal          Right lateral foot (S-1): normal       Sensory-Monofilament:          Left foot: normal          Right foot: normal       Inspection:          Left foot: normal          Right foot: normal       Nails:          Left foot: normal          Right foot: normal   Impression & Recommendations:  Problem # 1:  DIABETES MELLITUS, TYPE II, WITHOUT COMPLICATIONS (XTA-569.79)  very poor control. Need much tighter control epecially given CAD. Will start insulin.Marland Kitchenspent 30 min with pt in coordination of care and in face to face counseling  for medicaiton initiation.  Stop glipizide, continue metformin. Close follow up. [pt to titrate insulin up at home to goal FBS 80-120.  The following medications were removed from the medication list:    Actos 30 Mg Tabs (Pioglitazone hcl) .[name]... Take 1 tablet by mouth once daily His updated medication list for this problem includes:    Metformin Hcl 1000 Mg Tabs (Metformin hcl) .[name]... Take 1 tablet by mouth two times a day    Glipizide 10 Mg Tabs (Glipizide) .[name]... 2 tablet by mouth two times a day    Aspirin 325 Mg Tabs (Aspirin)    Lisinopril 5 Mg Tabs (Lisinopril) .[name]... 1 tab by mouth daily    Lantus Solostar 100 Unit/ml Soln (Insulin  glargine) .Marland KitchenMarland KitchenMarland KitchenMarland Kitchen 10 units daily..increase 2 units ever 2 days until at goal 80-120  Labs Reviewed: Creat: 1.4 (01/25/2010)     Last Eye Exam: normal (12/05/2008) Reviewed HgBA1c results: 7.7 (01/25/2010)  7.0 (11/02/2009)  Problem # 2:  HYPERCHOLESTEROLEMIA (ICD-272.0)  Restart simvastinatin His updated medication list for this problem includes:    Simvastatin 40 Mg Tabs (Simvastatin) ..... One tablet by mouth once daily  Labs Reviewed: SGOT: 35 (01/25/2010)   SGPT: 46 (01/25/2010)  Lipid Goals: Chol Goal: 200 (12/03/2009)  HDL Goal: 40 (12/03/2009)   LDL Goal: 70 (12/03/2009)   TG Goal: 150 (12/03/2009)  Prior 10 Yr Risk Heart Disease: N/A (12/03/2009)   HDL:39.30 (01/25/2010)  Chol:196 (01/25/2010)  Trig:500.0 (01/25/2010)  Problem # 3:  GOUT, UNSPECIFIED (ICD-274.9)  Elevated uric acid level. Given other new med starts will hold off but plan on starting allopurinol for gout prevention if unable to lower with diet change. info given on high uric acid food and drinks.   Complete Medication List: 1)  Metformin Hcl 1000 Mg Tabs (Metformin hcl) .... Take 1 tablet by mouth two times a day 2)  Simvastatin 40 Mg Tabs (Simvastatin) .... One tablet by mouth once daily 3)  Glipizide 10 Mg Tabs (Glipizide) .... 2 tablet by mouth two times a day 4)  Diphenoxylate-atropine 2.5-0.025 Mg Tabs (Diphenoxylate-atropine) .[name].. 1 tablet by mouth two times a day as needed 5)  Omeprazole 40 Mg Cpdr (Omeprazole) .... One tablet by mouth once daily hold until pt calls. 6)  Tamsulosin Hcl 0.4 Mg Caps (Tamsulosin hcl) .[name].. 1 tab by mouth daily 7)  Metoprolol Tartrate 25 Mg Tabs (Metoprolol tartrate) .... 1/2 tab twice a day 8)  Prilosec Otc 20 Mg Tbec (Omeprazole magnesium) .... One to two tablets daily 9)  Aspirin 325 Mg Tabs (Aspirin) 10)  Lisinopril 5 Mg Tabs (Lisinopril) .[name].. 1 tab by mouth daily 11)  Lantus Solostar 100 Unit/ml Soln (Insulin glargine) .[name].. 10 units daily..increase 2 units ever 2  days until at goal 80-120 12)  Ultra-thin Ii Pen Needles 29g X 12.30m Misc (Insulin pen needle) .... As directed  Patient Instructions: 1)  Restart simvastatin..Marland Kitchenf pain recurs decrease to 1/2 tab by mouth daily.  2)  Please schedule a follow-up appointment in 3 months  30 min 3)  BMP prior to visit, ICD-9: 250.00 4)  Hepatic Panel prior to visit ICD-9:  5)  Lipid panel prior to visit ICD-9 :  6)  HgBA1c prior to visit  ICD-9:  7)  Uric acid Dx  8)  Stop glipizide 9)  Check fasting blood sugar..first in AMaywoodfood..Marland Kitchenoal 80-120. 10)  Then once daily check 2 hours after a meal...goal <180. 11)  Start inslin 10 UNits in AM. 12)  MAke sure taking lisinopril.  Prescriptions: ULTRA-THIN II PEN NEEDLES 29G X 12.7MM MISC (INSULIN PEN NEEDLE) as directed  #90 x 3   Entered by:   HZenda AlpersCMA (AMartin   Authorized by:   AEliezer LoftsMD   Signed by:   HZenda AlpersCMA (ASuperior on 02/01/2010   Method used:   Re-Faxed to ...       [facility] ([facility]-order)       [place] [id number]      Ph: [phone]      Fax: [phone]  RxID:   [id number] SOLOSTAR 100 UNIT/ML SOLN (INSULIN GLARGINE) 10 units daily..increase 2 units ever 2 days until at goal 80-120  #1 box x 3   Entered and Authorized by:   AEliezer LoftsMD   Signed by:   AEliezer LoftsMD on 02/01/2010   Method used:   [facility] to        [facility] (retail)       [place]  [address]      Ph: [phone]      Fax: [phone]  RxID:   [id number]   Current Allergies (reviewed today): No known allergies

## 2011-01-04 NOTE — Progress Notes (Signed)
Summary: pt needs vials of lantus  Phone Note From Pharmacy Message from:  Fax from Pharmacy  Caller: [facility] Summary of Call: Pt wants to change from lantus solostar to vials but we ( including myself) keep sending the solostar to pharmacies.  Form from [facility] asking for clarification is on your desk. Initial call taken by: Marty Heck CMA,  May 17, 2010 3:14 PM     Appended Document: Orders Update    Clinical Lists Changes  Medications: Changed medication from LANTUS SOLOSTAR 100 UNIT/ML SOLN (INSULIN GLARGINE) 50 units daily..increase 2 units ever 2 days until at goal 80-120 to LANTUS 100 UNIT/ML SOLN (INSULIN GLARGINE) 50 units, titrate up as needed.

## 2011-01-04 NOTE — Progress Notes (Signed)
Summary: needs scripts faxed  Phone Note Call from Patient Call back at Home Phone [phone]   Caller: Spouse Summary of Call: [name] wants to get pt's insulin and his pens through [facility].  It looks like the pen needles have already been sent in , but the insulin was sent to [facility].  Please advise.  Scripts faxed to [facility] have to have his human[employer] ID number, [id number].  Fax number is [phone].  Scripts also need to say to hold until pt calls, pt doesnt need these yet. Initial call taken by: Marty Heck CMA,  February 01, 2010 4:36 PM  Follow-up for Phone Call        Can you fix this Heather..Thanks. Follow-up by: [name] MD,  February 02, 2010 8:21 AM    Prescriptions: LANTUS SOLOSTAR 100 UNIT/ML SOLN (INSULIN GLARGINE) 10 units daily..increase 2 units ever 2 days until at goal 80-120  #1 box x 3   Entered by:   Zenda Alpers CMA ([place])   Authorized by:   [name] MD   Signed by:   Zenda Alpers CMA ([place]) on 02/02/2010   Method used:   Faxed to ...       [facility] (mail-order)       [address] [place], [place]  [id number]       Ph: [phone]       Fax: [phone]   RxID:   [id number]

## 2011-02-01 ENCOUNTER — Other Ambulatory Visit: Payer: Self-pay | Admitting: Family Medicine

## 2011-02-01 ENCOUNTER — Other Ambulatory Visit (INDEPENDENT_AMBULATORY_CARE_PROVIDER_SITE_OTHER): Payer: Medicare PPO

## 2011-02-01 ENCOUNTER — Telehealth (INDEPENDENT_AMBULATORY_CARE_PROVIDER_SITE_OTHER): Payer: Self-pay | Admitting: *Deleted

## 2011-02-01 ENCOUNTER — Encounter (INDEPENDENT_AMBULATORY_CARE_PROVIDER_SITE_OTHER): Payer: Self-pay | Admitting: *Deleted

## 2011-02-01 DIAGNOSIS — E7800 Pure hypercholesterolemia, unspecified: Secondary | ICD-10-CM

## 2011-02-01 DIAGNOSIS — E119 Type 2 diabetes mellitus without complications: Secondary | ICD-10-CM

## 2011-02-01 DIAGNOSIS — Z125 Encounter for screening for malignant neoplasm of prostate: Secondary | ICD-10-CM

## 2011-02-01 DIAGNOSIS — E785 Hyperlipidemia, unspecified: Secondary | ICD-10-CM

## 2011-02-01 LAB — LIPID PANEL
HDL: 36.8 mg/dL — ABNORMAL LOW (ref >39.00)
Total CHOL/HDL Ratio: 5
Triglycerides: 355 mg/dL — ABNORMAL HIGH (ref 0.0–149.0)

## 2011-02-01 LAB — HEMOGLOBIN A1C: Hgb A1c MFr Bld: 8.6 % — ABNORMAL HIGH (ref 4.6–6.5)

## 2011-02-08 ENCOUNTER — Ambulatory Visit (INDEPENDENT_AMBULATORY_CARE_PROVIDER_SITE_OTHER): Payer: Medicare PPO | Admitting: Family Medicine

## 2011-02-08 ENCOUNTER — Encounter: Payer: Self-pay | Admitting: Family Medicine

## 2011-02-08 DIAGNOSIS — I10 Essential (primary) hypertension: Secondary | ICD-10-CM

## 2011-02-08 DIAGNOSIS — E7800 Pure hypercholesterolemia, unspecified: Secondary | ICD-10-CM

## 2011-02-08 DIAGNOSIS — E119 Type 2 diabetes mellitus without complications: Secondary | ICD-10-CM

## 2011-02-08 LAB — HM DIABETES FOOT EXAM

## 2011-02-10 NOTE — Progress Notes (Signed)
----  Converted from flag ---- ---- 02/01/2011 9:02 AM, [name] MD wrote: Yes Dx v76.44  ---- 02/01/2011 8:50 AM, Daralene Milch CMA (AAMA) wrote: pt request to have psa added to his labs today, is this ok? Thanks  Tasha ------------------------------

## 2011-02-15 NOTE — Assessment & Plan Note (Signed)
Summary: 3 m f/o DLO   Vital Signs:  Patient profile:   70 year old male Height:      68 inches Weight:      189 pounds BMI:     28.84 Temp:     98.1 degrees F oral Pulse rate:   76 / minute Pulse rhythm:   regular BP sitting:   110 / 70  (left arm) Cuff size:   regular  Vitals Entered By: Zenda Alpers CMA Deborra Medina) (February 08, 2011 10:54 AM)  History of Present Illness: Chief complaint 3 month follow up   DM poor control: Worsened control from last OV .Marland Kitchennow taking both doses of metformin. On metformin and lantus  daily.  No benefit despite split dosing. FBS in AMs remain around 130s. He is now up to 100 Units of insulin. Before dinner 150.Marland Kitchendoes not usually check after meals. Refuses Actos ..took in past did not like.  HAs noted when less active blood sugars up.  Gout: No further flares. Working on diet changes. Uric acid level has decreased from 8.8 to 7.8 at last OV..now stayed the same.   HTN, well controlled on current meds.  High cholesterol: HAs not been taking simvastatins .Marland KitchenMarland KitchenLDL almost at goal but worse than last check.  Triglycerides back up... likely due to DM being worse controlled on off statin.  Minimal leg pain back on med.   CAD, asymptomatic.   Problems Prior to Update: 1)  Special Screening Malignant Neoplasm of Prostate  (ICD-V76.44) 2)  Gout, Unspecified  (ICD-274.9) 3)  C A D  (ICD-414.00) 4)  Rales, Bibasilar  (ICD-786.7) 5)  Benign Prostatic Hypertrophy, Mild, Hx of  (ICD-V13.8) 6)  Osteoarthritis, Multiple Joints  (ICD-715.89) 7)  Diabetes Mellitus, Type II, Without Complications  (ZGY-174.94) 8)  Essential Hypertension, Benign  (ICD-401.1) 9)  Hypercholesterolemia  (ICD-272.0) 10)  Esophageal Stricture  (ICD-530.3) 11)  Gerd  (ICD-530.81) 12)  Gastritis  (ICD-535.50) 13)  Irritable Bowel Syndrome  (ICD-564.1) 14)  Personal Hx Colonic Polyps  (ICD-V12.72)  Current Medications (verified): 1)  Metformin Hcl 1000 Mg Tabs (Metformin Hcl) ....  Take 1 Tablet By Mouth Two Times A Day 2)  Simvastatin 40 Mg Tabs (Simvastatin) .... One Tablet By Mouth Once Daily 3)  Diphenoxylate-Atropine 2.5-0.025 Mg Tabs (Diphenoxylate-Atropine) .[name].. 1 Tablet By Mouth Two Times A Day As Needed 4)  Omeprazole 40 Mg Cpdr (Omeprazole) .... One Tablet By Mouth Once Daily Hold Until Pt Calls. 5)  Tamsulosin Hcl 0.4 Mg Caps (Tamsulosin Hcl) .[name].. 1 Tab By Mouth Daily 6)  Metoprolol Tartrate 25 Mg Tabs (Metoprolol Tartrate) .... 1/2 Tab Twice A Day 7)  Prilosec Otc 20 Mg Tbec (Omeprazole Magnesium) .... One To Two Tablets Daily 8)  Aspirin 325 Mg Tabs (Aspirin) 9)  Lisinopril 5 Mg Tabs (Lisinopril) .[name].. 1 Tab By Mouth Daily 10)  Lantus 100 Unit/ml Soln (Insulin Glargine) .[name].. 100 Units, Titrate Up As Needed. 11)  Ultra-Thin Ii Pen Needles 29g X 12.38m Misc (Insulin Pen Needle) .... As Directed 12)  Ultra-Thin Ii Ins Syr Short 30g X 5/16" 0.5 Ml Misc (Insulin Syringe-Needle U-100) .... Andsyringes  Allergies (verified): No Known Drug Allergies  Past History:  Past medical, surgical, family and social histories (including risk factors) reviewed, and no changes noted (except as noted below).  Past Medical History: Reviewed history from 11/02/2009 and no changes required. CAD, s/p CABG x 3 Diabetes Adenomatous colon polyps 2008 IBS Hyperlipidemia GERD Esophageal stricture  Past Surgical History: Reviewed history from  11/02/2009 and no changes required. CABG x 3, November, 2011. [facility]  Family History: Reviewed history from 10/20/2009 and no changes required. Mother-Deceased age 12. CHF Father-Deceased age 77. Ruptured Abdominal Aortic Aneurysm No FH of Colon Cancer: brother: alcoholic  Social History: Reviewed history from 08/26/2009 and no changes required. 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 Diet: fruits and veggies, fish, chicken, water  Review of Systems General:  Denies fatigue and fever. CV:  Denies chest pain or discomfort. Resp:  Denies shortness of breath. GI:  Denies abdominal pain. GU:  Denies dysuria.  Physical Exam  General:  Well-developed,well-nourished,in no acute distress; alert,appropriate and cooperative throughout examination Mouth:  Oral mucosa and oropharynx without lesions or exudates.  Teeth in good repair. Neck:  no carotid bruit or thyromegaly no cervical or supraclavicular lymphadenopathy  Lungs:  Normal respiratory effort, chest expands symmetrically. Crackles in bases..stable. Heart:  Normal rate and regular rhythm. S1 and S2 normal without gallop, murmur, click, rub or other extra sounds. Abdomen:  Bowel sounds positive,abdomen soft and non-tender without masses, organomegaly or hernias noted. Pulses:  R and L posterior tibial pulses are full and equal bilaterally  Extremities:  no edema   Diabetes Management Exam:    Foot Exam (with socks and/or shoes not present):       Sensory-Pinprick/Light touch:          Left medial foot (L-4): normal          Left dorsal foot (L-5): normal          Left lateral foot (S-1): normal          Right medial foot (L-4): normal          Right dorsal foot (L-5): normal          Right lateral foot (S-1): normal       Sensory-Monofilament:          Left foot: normal          Right foot: normal       Inspection:          Left foot: normal          Right foot: normal       Nails:          Left foot: normal          Right foot: normal   Impression & Recommendations:  Problem # 1:  DIABETES MELLITUS, TYPE II, WITHOUT COMPLICATIONS (KUG-405.02) Poor control.. recommended referral to ENDO.. pt will consider. INcrease exercsie.[name] if blood sugars not coming down... increase lantus to 55 Units two times a day.  His updated medication list for this problem includes:    Metformin Hcl 1000  Mg Tabs (Metformin hcl) .[name]... Take 1 tablet by mouth two times a day    Aspirin 325 Mg Tabs (Aspirin)    Lisinopril 5 Mg Tabs (Lisinopril) .[name]... 1 tab by mouth daily    Lantus 100 Unit/ml Soln (Insulin glargine) .Marland KitchenMarland KitchenMarland KitchenMarland Kitchen 100 units, titrate up as needed.  Problem # 2:  ESSENTIAL HYPERTENSION, BENIGN (ICD-401.1)  Well controlled. Continue current medication.  His updated medication list for this problem includes:    Metoprolol Tartrate 25 Mg Tabs (Metoprolol tartrate) .[name]... 1/2 tab twice a day    Lisinopril 5 Mg Tabs (Lisinopril) .[name]... 1 tab by mouth daily  BP today: 110/70 Prior BP: 120/76 (11/12/2010)  Prior 10 Yr Risk Heart Disease: N/A (12/03/2009)  Labs Reviewed: K+: 4.7 (11/04/2010) Creat: : 1.2 (11/04/2010)   Chol: 190 (02/01/2011)   HDL: 36.80 (02/01/2011)   TG: 355.0 (02/01/2011)  Problem # 3:  HYPERCHOLESTEROLEMIA (ICD-272.0)  Restart med. Encouraged exercise, weight loss, healthy eating habits.  His updated medication list for this problem includes:    Simvastatin 40 Mg Tabs (Simvastatin) ..... One tablet by mouth once daily  Labs Reviewed: SGOT: 34 (11/04/2010)   SGPT: 37 (11/04/2010)  Lipid Goals: Chol Goal: 200 (12/03/2009)   HDL Goal: 40 (12/03/2009)   LDL Goal: 70 (12/03/2009)   TG Goal: 150 (12/03/2009)  Prior 10 Yr Risk Heart Disease: N/A (12/03/2009)   HDL:36.80 (02/01/2011), 34.90 (11/04/2010)  Chol:190 (02/01/2011), 176 (11/04/2010)  Trig:355.0 (02/01/2011), 204.0 (11/04/2010)  Complete Medication List: 1)  Metformin Hcl 1000 Mg Tabs (Metformin hcl) .... Take 1 tablet by mouth two times a day 2)  Simvastatin 40 Mg Tabs (Simvastatin) .... One tablet by mouth once daily 3)  Diphenoxylate-atropine 2.5-0.025 Mg Tabs (Diphenoxylate-atropine) .[name].. 1 tablet by mouth two times a day as needed 4)  Omeprazole 40 Mg Cpdr (Omeprazole) .... One tablet by mouth once daily hold until pt calls. 5)  Tamsulosin Hcl 0.4 Mg Caps (Tamsulosin hcl) .[name].. 1 tab by mouth daily 6)   Metoprolol Tartrate 25 Mg Tabs (Metoprolol tartrate) .... 1/2 tab twice a day 7)  Prilosec Otc 20 Mg Tbec (Omeprazole magnesium) .... One to two tablets daily 8)  Aspirin 325 Mg Tabs (Aspirin) 9)  Lisinopril 5 Mg Tabs (Lisinopril) .[name].. 1 tab by mouth daily 10)  Lantus 100 Unit/ml Soln (Insulin glargine) .[name].. 100 units, titrate up as needed. 11)  Ultra-thin Ii Pen Needles 29g X 12.28m Misc (Insulin pen needle) .... As directed 12)  Ultra-thin Ii Ins Syr Short 30g X 5/16" 0.5 Ml Misc (Insulin syringe-needle u-100) .... Andsyringes  Patient Instructions: 1)  Get back on track with exercise, work on diet as well. 2)   Call if open to ENDO referral. 3)  Consider increasing insulin to 55 Units daily.  4)  Lipid panel  and CMET  prior to visit ICD-9 : 250.00 5)  HgBA1c prior to visit  ICD-9:  6)  Please schedule a follow-up appointment in 3 months .    Orders Added: 1)  Est. Patient Level IV [[[id number]]   Current Allergies (reviewed today): No known allergies

## 2011-05-06 ENCOUNTER — Other Ambulatory Visit (INDEPENDENT_AMBULATORY_CARE_PROVIDER_SITE_OTHER): Payer: Medicare PPO | Admitting: Family Medicine

## 2011-05-06 DIAGNOSIS — E119 Type 2 diabetes mellitus without complications: Secondary | ICD-10-CM

## 2011-05-06 DIAGNOSIS — E7800 Pure hypercholesterolemia, unspecified: Secondary | ICD-10-CM

## 2011-05-06 LAB — COMPREHENSIVE METABOLIC PANEL
Albumin: 4.3 g/dL (ref 3.5–5.2)
Alkaline Phosphatase: 52 U/L (ref 39–117)
BUN: 26 mg/dL — ABNORMAL HIGH (ref 6–23)
CO2: 26 mEq/L (ref 19–32)
Calcium: 9.6 mg/dL (ref 8.4–10.5)
GFR: 55.87 mL/min — ABNORMAL LOW (ref >60.00)
Glucose, Bld: 210 mg/dL — ABNORMAL HIGH (ref 70–99)
Potassium: 5.2 mEq/L — ABNORMAL HIGH (ref 3.5–5.1)
Total Protein: 7 g/dL (ref 6.0–8.3)

## 2011-05-06 LAB — LIPID PANEL: HDL: 36.5 mg/dL — ABNORMAL LOW (ref >39.00)

## 2011-05-06 LAB — LDL CHOLESTEROL, DIRECT: Direct LDL: 104.7 mg/dL

## 2011-05-06 LAB — HEMOGLOBIN A1C: Hgb A1c MFr Bld: 8 % — ABNORMAL HIGH (ref 4.6–6.5)

## 2011-05-13 ENCOUNTER — Encounter: Payer: Self-pay | Admitting: Family Medicine

## 2011-05-13 ENCOUNTER — Ambulatory Visit (INDEPENDENT_AMBULATORY_CARE_PROVIDER_SITE_OTHER): Payer: Medicare PPO | Admitting: Family Medicine

## 2011-05-13 DIAGNOSIS — E7800 Pure hypercholesterolemia, unspecified: Secondary | ICD-10-CM

## 2011-05-13 DIAGNOSIS — I10 Essential (primary) hypertension: Secondary | ICD-10-CM

## 2011-05-13 DIAGNOSIS — E119 Type 2 diabetes mellitus without complications: Secondary | ICD-10-CM

## 2011-05-13 DIAGNOSIS — E875 Hyperkalemia: Secondary | ICD-10-CM

## 2011-05-13 DIAGNOSIS — I2510 Atherosclerotic heart disease of native coronary artery without angina pectoris: Secondary | ICD-10-CM

## 2011-05-13 MED ORDER — SIMVASTATIN 40 MG PO TABS: 40.0000 mg | ORAL_TABLET | Freq: Every day | ORAL | Status: DC

## 2011-05-13 MED ORDER — METFORMIN HCL 1000 MG PO TABS: 1000.0000 mg | ORAL_TABLET | Freq: Two times a day (BID) | ORAL | Status: DC

## 2011-05-13 NOTE — Assessment & Plan Note (Signed)
Some improvement but far from goal. Discussed option of Victoza, but pt will start by increasing Lantus 55 Units BID and split dose.   Encouraged exercise, weight loss, healthy eating habits.

## 2011-05-13 NOTE — Assessment & Plan Note (Signed)
Assymptomatic

## 2011-05-13 NOTE — Assessment & Plan Note (Signed)
Improved control but not yet at LDL goal <70. Encouraged him to increase to taking simvastain daily to at least every other day.

## 2011-05-13 NOTE — Progress Notes (Signed)
  Subjective:    Patient ID: Brian Bass, male    DOB: 1941-07-19, 70 y.o.   MRN: [id number]  HPI  DM poor control:Improvement from last OV . On metformin and lantus daily.  FBS in AMs remain around 200s. He is now up to 100 Units of insulin. Never increased 55 Units BID. Before dinner 150.Marland Kitchendoes not usually check after meals.  No numbers less than 60. Refuses Actos ..took in past did not like.    HTN, well controlled on current meds.  Lisinorpil and metoprolol  High cholesterol: On simvastain, using every few days, but not daily .Marland KitchenLDL closer but still at goal. Triglycerides continuing to drop.  No myalgia back on med.   CAD, asymptomatic.   High potassium: Eating bannanas daily.   Review of Systems  Constitutional: Negative for fever and unexpected weight change.  HENT: Negative for ear pain.   Eyes: Negative for pain.  Respiratory: Negative for shortness of breath.   Cardiovascular: Negative for chest pain and leg swelling.  Gastrointestinal: Negative for abdominal distention.  Genitourinary: Negative for dysuria.  Psychiatric/Behavioral: Negative for behavioral problems.       Objective:   Physical Exam  Constitutional: Vital signs are normal. He appears well-developed and well-nourished.  HENT:  Head: Normocephalic.  Right Ear: Hearing normal.  Left Ear: Hearing normal.  Nose: Nose normal.  Mouth/Throat: Oropharynx is clear and moist and mucous membranes are normal.  Neck: Trachea normal. Carotid bruit is not present. No mass and no thyromegaly present.  Cardiovascular: Normal rate, regular rhythm and normal pulses.  Exam reveals no gallop, no distant heart sounds and no friction rub.   No murmur heard.      No peripheral edema  Pulmonary/Chest: Effort normal and breath sounds normal. No respiratory distress.  Skin: Skin is warm, dry and intact. No rash noted.  Psychiatric: He has a normal mood and affect. His speech is normal and behavior is normal. Thought  content normal.        Diabetic foot exam: Normal inspection No skin breakdown No calluses  Normal DP pulses Normal sensation to light touch and monofilament Nails normal   Assessment & Plan:

## 2011-05-13 NOTE — Patient Instructions (Addendum)
Increase simvastatin to every other day or daily.  Increase to 2 tab po daily of fish oil.  Decrease bannanas or stop altogether...return for lab recheck early next week.  Increase Lantus to 55 Units in AM and at PM. Victoza is a medication to consider.

## 2011-05-13 NOTE — Assessment & Plan Note (Signed)
Well controlled. Continue current medication.

## 2011-05-16 ENCOUNTER — Other Ambulatory Visit (INDEPENDENT_AMBULATORY_CARE_PROVIDER_SITE_OTHER): Payer: Medicare PPO | Admitting: Family Medicine

## 2011-05-16 DIAGNOSIS — E875 Hyperkalemia: Secondary | ICD-10-CM

## 2011-05-16 LAB — POTASSIUM: Potassium: 4.7 mEq/L (ref 3.5–5.1)

## 2011-05-26 LAB — HM DIABETES EYE EXAM: HM Diabetic Eye Exam: NORMAL

## 2011-08-18 ENCOUNTER — Telehealth: Payer: Self-pay | Admitting: Family Medicine

## 2011-08-18 DIAGNOSIS — E7800 Pure hypercholesterolemia, unspecified: Secondary | ICD-10-CM

## 2011-08-18 DIAGNOSIS — E119 Type 2 diabetes mellitus without complications: Secondary | ICD-10-CM

## 2011-08-18 NOTE — Telephone Encounter (Signed)
Message copied by [name] E on Thu Aug 18, 2011  8:08 AM ------      Message from: Daralene Milch C      Created: Wed Aug 17, 2011 10:44 AM      Regarding: Cpx labs Fri 9/14       Please order  future cpx labs for pt's upcomming lab appt.      Thanks      Aniceto Boss

## 2011-08-19 ENCOUNTER — Other Ambulatory Visit (INDEPENDENT_AMBULATORY_CARE_PROVIDER_SITE_OTHER): Payer: Medicare PPO

## 2011-08-19 DIAGNOSIS — E119 Type 2 diabetes mellitus without complications: Secondary | ICD-10-CM

## 2011-08-19 DIAGNOSIS — E7800 Pure hypercholesterolemia, unspecified: Secondary | ICD-10-CM

## 2011-08-19 LAB — LIPID PANEL
Cholesterol: 117 mg/dL (ref 0–200)
HDL: 34.4 mg/dL — ABNORMAL LOW (ref >39.00)
LDL Cholesterol: 61 mg/dL (ref 0–99)
VLDL: 22 mg/dL (ref 0.0–40.0)

## 2011-08-19 LAB — COMPREHENSIVE METABOLIC PANEL
Alkaline Phosphatase: 56 U/L (ref 39–117)
Creatinine, Ser: 1.1 mg/dL (ref 0.4–1.5)
Glucose, Bld: 179 mg/dL — ABNORMAL HIGH (ref 70–99)
Sodium: 138 mEq/L (ref 135–145)
Total Bilirubin: 0.6 mg/dL (ref 0.3–1.2)
Total Protein: 7.4 g/dL (ref 6.0–8.3)

## 2011-08-26 ENCOUNTER — Ambulatory Visit (INDEPENDENT_AMBULATORY_CARE_PROVIDER_SITE_OTHER): Payer: Medicare PPO | Admitting: Family Medicine

## 2011-08-26 ENCOUNTER — Encounter: Payer: Self-pay | Admitting: Family Medicine

## 2011-08-26 DIAGNOSIS — E7800 Pure hypercholesterolemia, unspecified: Secondary | ICD-10-CM

## 2011-08-26 DIAGNOSIS — I2510 Atherosclerotic heart disease of native coronary artery without angina pectoris: Secondary | ICD-10-CM

## 2011-08-26 DIAGNOSIS — E119 Type 2 diabetes mellitus without complications: Secondary | ICD-10-CM

## 2011-08-26 DIAGNOSIS — I10 Essential (primary) hypertension: Secondary | ICD-10-CM

## 2011-08-26 NOTE — Patient Instructions (Signed)
Make effort to make changes in Lantus to 55 Units twice daily. Work on low carbohydrate diet. Keep up good work on taking cholesterol meds.

## 2011-08-26 NOTE — Progress Notes (Signed)
  Subjective:    Patient ID: Brian Bass, male    DOB: [birth year], 70 y.o.   MRN: [id number]  HPI  Hypertension:   At  goal <130/80 on lisinopril  Using medication without problems or lightheadedness:  Chest pain with exertion: None Edema:None Short of breath:None Average home BPs: Other issues:  Diabetes: Continued poor control on glipizide, lantus 100 Units daily, glucophage. Never changed to higher dose of lantus.   Lab Results  Component Value Date   HGBA1C 8.1* 08/19/2011   Refuses actos as insulin sensitizer. 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  Elevated Cholesterol: triglycerides now at goal on fish oil ! LDL at goal <70 on simvastatin 40 daily  Using medications without problems: Yes Muscle aches: None Other complaints:  CAD: assymptomatic  Review of Systems  Constitutional: Negative for fever and fatigue.  HENT: Negative for ear pain.   Eyes: Negative for pain.  Respiratory: Negative for shortness of breath and wheezing.   Cardiovascular: Negative for chest pain, palpitations and leg swelling.  Gastrointestinal: Negative for abdominal pain, diarrhea, constipation and abdominal distention.  Skin: Negative for rash.       Objective:   Physical Exam  Constitutional: Vital signs are normal. He appears well-developed and well-nourished.  HENT:  Head: Normocephalic.  Right Ear: Hearing normal.  Left Ear: Hearing normal.  Nose: Nose normal.  Mouth/Throat: Oropharynx is clear and moist and mucous membranes are normal.  Neck: Trachea normal. Carotid bruit is not present. No mass and no thyromegaly present.  Cardiovascular: Normal rate, regular rhythm and normal pulses.  Exam reveals no gallop, no distant heart sounds and no friction rub.   No murmur heard.      No  peripheral edema  Pulmonary/Chest: Effort normal and breath sounds normal. No respiratory distress.  Skin: Skin is warm, dry and intact. No rash noted.  Psychiatric: He has a normal mood and affect. His speech is normal and behavior is normal. Thought content normal.   Diabetic foot exam: Normal inspection No skin breakdown No calluses  Normal DP pulses Normal sensation to light touch and monofilament Nails normal         Assessment & Plan:

## 2011-08-26 NOTE — Assessment & Plan Note (Signed)
Poor control.. Pt never made changes with LAntus.. Will try again.  Poor diet... He will work on this. Not interested in additional med at this time.

## 2011-08-26 NOTE — Assessment & Plan Note (Signed)
Well controlled. Continue current medication.  

## 2011-08-26 NOTE — Assessment & Plan Note (Signed)
Stable asymptomatic

## 2011-08-26 NOTE — Assessment & Plan Note (Signed)
Improved control of triglyceridesnow he is taking his medication!

## 2011-09-06 LAB — GLUCOSE, CAPILLARY: Glucose-Capillary: 146 — ABNORMAL HIGH

## 2011-10-05 ENCOUNTER — Ambulatory Visit (INDEPENDENT_AMBULATORY_CARE_PROVIDER_SITE_OTHER): Payer: Medicare PPO

## 2011-10-05 DIAGNOSIS — Z23 Encounter for immunization: Secondary | ICD-10-CM

## 2011-10-15 ENCOUNTER — Other Ambulatory Visit: Payer: Self-pay | Admitting: Family Medicine

## 2011-10-18 ENCOUNTER — Other Ambulatory Visit: Payer: Self-pay | Admitting: Family Medicine

## 2011-10-18 ENCOUNTER — Other Ambulatory Visit: Payer: Self-pay | Admitting: *Deleted

## 2011-10-18 MED ORDER — GLIPIZIDE 10 MG PO TABS: 20.0000 mg | ORAL_TABLET | Freq: Two times a day (BID) | ORAL | Status: DC

## 2011-11-04 ENCOUNTER — Other Ambulatory Visit (INDEPENDENT_AMBULATORY_CARE_PROVIDER_SITE_OTHER): Payer: Medicare PPO

## 2011-11-04 DIAGNOSIS — E119 Type 2 diabetes mellitus without complications: Secondary | ICD-10-CM

## 2011-11-04 LAB — HEMOGLOBIN A1C: Hgb A1c MFr Bld: 7.4 % — ABNORMAL HIGH (ref 4.6–6.5)

## 2011-11-11 ENCOUNTER — Encounter: Payer: Self-pay | Admitting: Family Medicine

## 2011-11-11 ENCOUNTER — Ambulatory Visit (INDEPENDENT_AMBULATORY_CARE_PROVIDER_SITE_OTHER): Payer: Medicare PPO | Admitting: Family Medicine

## 2011-11-11 VITALS — BP 120/70 | HR 76 | Temp 97.8°F | Ht 67.25 in | Wt 184.4 lb

## 2011-11-11 DIAGNOSIS — I10 Essential (primary) hypertension: Secondary | ICD-10-CM

## 2011-11-11 DIAGNOSIS — Z87898 Personal history of other specified conditions: Secondary | ICD-10-CM

## 2011-11-11 DIAGNOSIS — Z0000 Encounter for general adult medical examination without abnormal findings: Secondary | ICD-10-CM

## 2011-11-11 DIAGNOSIS — E119 Type 2 diabetes mellitus without complications: Secondary | ICD-10-CM

## 2011-11-11 DIAGNOSIS — I2510 Atherosclerotic heart disease of native coronary artery without angina pectoris: Secondary | ICD-10-CM

## 2011-11-11 DIAGNOSIS — E7800 Pure hypercholesterolemia, unspecified: Secondary | ICD-10-CM

## 2011-11-11 MED ORDER — FINASTERIDE 5 MG PO TABS: 5.0000 mg | ORAL_TABLET | Freq: Every day | ORAL | Status: AC

## 2011-11-11 NOTE — Assessment & Plan Note (Signed)
At goal last check

## 2011-11-11 NOTE — Patient Instructions (Addendum)
Decrease  metformin to 1/2 tab twice daily to see if diarrhea decreased. Stop glipizide all together. Take 100 units of lantus at night, and 40 Units in morning as well, and if blood sugar in morning is above 120 then ncrease Lantus by 2 Units every 2 days. Call with blood sugar measurement in next 10-2 weeks for further adjustment. Start proscar for prostate symtpoms. Look into when next colonoscopy due. Look into setting up a living will.   Follow up labs in 3 months, next appt in early MAy for Diabetes management.

## 2011-11-11 NOTE — Assessment & Plan Note (Signed)
Poor control. Will try addition of proscar if covered by insurance.

## 2011-11-11 NOTE — Assessment & Plan Note (Signed)
IMproved A1C.. But unsafe regimen.. Pt hesitant to take my recommendations for steady insulin dosing.  Counsled him on danger of low 30-40 and taking too much insulin.  pt voiced understanding but state he is still going to do what he is going to do.  I recommended compromise by decreasing metformin to avoid diarrhea, stop glipizide and take 40 units AM insulin (EVERY DAY) slowly titrate up insulin 2 units every 2 days.

## 2011-11-11 NOTE — Progress Notes (Signed)
Subjective:    Patient ID: Brian Bass, male    DOB: 1941-09-17, 70 y.o.   MRN: [id number]  HPI  I have personally reviewed the Medicare Annual Wellness questionnaire and have noted 1. The patient's medical and social history 2. Their use of alcohol, tobacco or illicit drugs 3. Their current medications and supplements 4. The patient's functional ability including ADL's, fall risks, home safety risks and hearing or visual             impairment. 5. Diet and physical activities 6. Evidence for depression or mood disorders The patients weight, height, BMI and visual acuity have been recorded in the chart I have made referrals, counseling and provided education to the patient based review of the above and I have provided the pt with a written personalized care plan for preventive services.   Hypertension:   Well controlled at goal on lisinopril  Using medication without problems or lightheadedness:   Was feeling dizzy  With sitting to standing so stopped metoprolol 25 mg  1/2 tab BID.  BP remains well controlled.  Chest pain with exertion:None Edema:None Short of breath:None Average home BPs: 120/70 Other issues:  Elevated Cholesterol: LDL at goal last check in 08/2011 <70 on zocor 40 mg daily.  Using medications without problems: Muscle aches:  Diet compliance: Exercise: Other complaints: CAD  Diabetes:  Improving on glucophage, lantus , glipizide He has been changing his meds himself.. Takes 100 Units at PM,  If FBS 180-200..he is take additional 100 units  (when he does this he does not take glipizide)...taking about every other day.  Diarrhea caused by metformin is bothersome. Lab Results  Component Value Date   HGBA1C 7.4* 11/04/2011   Using medications without difficulties: Hypoglycemic episodes: has had several 30-40 Hyperglycemic episodes: occ above  Feet problems:NOne Blood Sugars averaging:  See above.. Checking 2 times a day eye exam within last year: Yres.    BPH: intermittant control on tamulosin. Slow stream, dribbling, stop and start. Some urinary urgency,. No dysuria.    Review of Systems  Constitutional: Negative for fever, fatigue and unexpected weight change.  HENT: Negative for ear pain, congestion, sore throat, rhinorrhea, trouble swallowing and postnasal drip.   Eyes: Negative for pain.  Respiratory: Negative for cough, shortness of breath and wheezing.   Cardiovascular: Negative for chest pain, palpitations and leg swelling.  Gastrointestinal: Negative for nausea, abdominal pain, diarrhea, constipation and blood in stool.  Genitourinary: Positive for urgency, frequency and difficulty urinating. Negative for dysuria, hematuria, discharge, penile swelling, scrotal swelling, penile pain and testicular pain.  Skin: Negative for rash.  Neurological: Negative for syncope, weakness, light-headedness, numbness and headaches.  Psychiatric/Behavioral: Negative for behavioral problems and dysphoric mood. The patient is not nervous/anxious.        Objective:   Physical Exam  Constitutional: He appears well-developed and well-nourished.  Non-toxic appearance. He does not appear ill. No distress.  HENT:  Head: Normocephalic and atraumatic.  Right Ear: Hearing, tympanic membrane, external ear and ear canal normal.  Left Ear: Hearing, tympanic membrane, external ear and ear canal normal.  Nose: Nose normal.  Mouth/Throat: Uvula is midline, oropharynx is clear and moist and mucous membranes are normal.  Eyes: Conjunctivae, EOM and lids are normal. Pupils are equal, round, and reactive to light. No foreign bodies found.  Neck: Trachea normal, normal range of motion and phonation normal. Neck supple. Carotid bruit is not present. No mass and no thyromegaly present.  Cardiovascular: Normal rate, regular rhythm, S1  normal, S2 normal, intact distal pulses and normal pulses.  Exam reveals no gallop.   No murmur heard. Pulmonary/Chest: He has no  wheezes. He has no rhonchi. He has rales in the right lower field and the left lower field.       Chronic and stable.  Abdominal: Soft. Normal appearance and bowel sounds are normal. There is no hepatosplenomegaly. There is no tenderness. There is no rebound, no guarding and no CVA tenderness. No hernia.  Lymphadenopathy:    He has no cervical adenopathy.  Neurological: He is alert. He has normal strength and normal reflexes. No cranial nerve deficit or sensory deficit. Gait normal.  Skin: Skin is warm, dry and intact. No rash noted.  Psychiatric: He has a normal mood and affect. His speech is normal and behavior is normal. Judgment normal.   Diabetic foot exam: Normal inspection No skin breakdown No calluses  Normal DP pulses Normal sensation to light touch and monofilament Nails normal        Assessment & Plan:  Annual Medicare Wellness: The patient's preventative maintenance and recommended screening tests for an annual wellness exam were reviewed in full today. Brought up to date unless services declined.  Counselled on the importance of diet, exercise, and its role in overall health and mortality. The patient's FH and SH was reviewed, including their home life, tobacco status, and drug and alcohol status.   Prostate cancer: PSA not indicated after age 52. Vaccines: Up to date with flu, Tdap, PNA, offered shingles vaccine. Colonoscopy: He believes colonoscopy in last year or two.Marland KitchenMarland Kitchen

## 2011-11-11 NOTE — Assessment & Plan Note (Signed)
Well controlled. Continue current medication.  

## 2011-12-13 ENCOUNTER — Encounter: Payer: Self-pay | Admitting: Family Medicine

## 2011-12-13 ENCOUNTER — Ambulatory Visit (INDEPENDENT_AMBULATORY_CARE_PROVIDER_SITE_OTHER): Payer: Medicare PPO | Admitting: Family Medicine

## 2011-12-13 VITALS — BP 118/70 | HR 91 | Temp 97.8°F | Wt 183.5 lb

## 2011-12-13 DIAGNOSIS — J069 Acute upper respiratory infection, unspecified: Secondary | ICD-10-CM

## 2011-12-13 MED ORDER — HYDROCOD POLST-CHLORPHEN POLST 10-8 MG/5ML PO LQCR: 5.0000 mL | Freq: Every evening | ORAL | Status: DC | PRN

## 2011-12-13 MED ORDER — AZITHROMYCIN 250 MG PO TABS: ORAL_TABLET | ORAL | Status: AC

## 2011-12-13 NOTE — Progress Notes (Signed)
  Subjective:    Patient ID: Brian Bass, male    DOB: 11-24-1941, 71 y.o.   MRN: [id number]  HPI 71 yo pt of Dr. Diona Browner, new to me, with h/o DM, CAD here with cough and congestion.  Notes reviewed, has h/p bibasilar rales on exam but no diagnosis of COPD.  Has had increased productive cough, malaise and sinus congestion for past 1 and 1/2 weeks.  No CP or SOB. Ears popping and throat sore.  Has "tried everything OTC."   Review of Systems     Objective:   Physical Exam BP 118/70  Pulse 91  Temp(Src) 97.8 F (36.6 C) (Oral)  Wt 183 lb 8 oz (83.235 kg)  Constitutional: He appears well-developed and well-nourished. Non-toxic appearance. He does not appear ill. No distress.  HENT:  Head: Normocephalic and atraumatic.  Right Ear: Hearing, tympanic membrane, external ear and ear canal normal.  Left Ear: Hearing, tympanic membrane, external ear and ear canal normal.  Nose: +pos erythema, frontal sinuses TTP Mouth/Throat: +PND Eyes: Conjunctivae, EOM and lids are normal. Pupils are equal, round, and reactive to light. No foreign bodies found.  Neck: Trachea normal, normal range of motion and phonation normal. Neck supple. Carotid bruit is not present. No mass and no thyromegaly present.  Cardiovascular: Normal rate, regular rhythm, S1 normal, S2 normal, intact distal pulses and normal pulses. Exam reveals no gallop.  No murmur heard.  Pulmonary/Chest: He has no wheezes. He has no rhonchi. He has rales in the right lower field and the left lower field.  Chronic and stable per Dr. Rometta Emery notes.  Abdominal: Soft. Normal appearance and bowel sounds are normal. There is no hepatosplenomegaly. There is no tenderness. There is no rebound, no guarding and no CVA tenderness. No hernia.  Lymphadenopathy:  He has no cervical adenopathy.  Neurological: He is alert. He has normal strength and normal reflexes. No cranial nerve deficit or sensory deficit. Gait normal.  Skin: Skin is  warm, dry and intact. No rash noted.  Psychiatric: He has a normal mood and affect. His speech is normal and behavior is normal. Judgment normal.     Assessment & Plan:  1.  URI- Given duration and progression of symptoms, will treat for bacterial process with Zpack. Supportive care as per pt instructions.

## 2011-12-13 NOTE — Patient Instructions (Signed)
Nice to meet you, Mr. Brian Bass. Take antibiotic as directed.  Drink lots of fluids.  Treat sympotmatically with Mucinex, nasal saline irrigation, and Tylenol/Ibuprofen.Cough suppressant at night. Call if not improving as expected in 5-7 days.

## 2012-01-05 ENCOUNTER — Telehealth: Payer: Self-pay | Admitting: Family Medicine

## 2012-01-05 DIAGNOSIS — E7800 Pure hypercholesterolemia, unspecified: Secondary | ICD-10-CM

## 2012-01-05 DIAGNOSIS — M109 Gout, unspecified: Secondary | ICD-10-CM

## 2012-01-05 DIAGNOSIS — E119 Type 2 diabetes mellitus without complications: Secondary | ICD-10-CM

## 2012-01-05 NOTE — Telephone Encounter (Signed)
Message copied by Jinny Sanders on Thu Jan 05, 2012  9:58 PM ------      Message from: Ellamae Sia      Created: Thu Jan 05, 2012 11:43 AM       3 month labs for March

## 2012-02-10 ENCOUNTER — Other Ambulatory Visit (INDEPENDENT_AMBULATORY_CARE_PROVIDER_SITE_OTHER): Payer: Medicare PPO

## 2012-02-10 DIAGNOSIS — E7800 Pure hypercholesterolemia, unspecified: Secondary | ICD-10-CM

## 2012-02-10 DIAGNOSIS — E119 Type 2 diabetes mellitus without complications: Secondary | ICD-10-CM

## 2012-02-10 DIAGNOSIS — M109 Gout, unspecified: Secondary | ICD-10-CM

## 2012-02-10 LAB — COMPREHENSIVE METABOLIC PANEL
CO2: 28 mEq/L (ref 19–32)
Calcium: 9.7 mg/dL (ref 8.4–10.5)
Chloride: 104 mEq/L (ref 96–112)
Creatinine, Ser: 1.2 mg/dL (ref 0.4–1.5)
GFR: 62.72 mL/min (ref >60.00)
Glucose, Bld: 109 mg/dL — ABNORMAL HIGH (ref 70–99)
Sodium: 139 mEq/L (ref 135–145)
Total Bilirubin: 0.7 mg/dL (ref 0.3–1.2)
Total Protein: 7.4 g/dL (ref 6.0–8.3)

## 2012-02-10 LAB — LIPID PANEL
HDL: 38.7 mg/dL — ABNORMAL LOW (ref >39.00)
Total CHOL/HDL Ratio: 3

## 2012-02-10 LAB — HEMOGLOBIN A1C: Hgb A1c MFr Bld: 7.2 % — ABNORMAL HIGH (ref 4.6–6.5)

## 2012-02-22 ENCOUNTER — Other Ambulatory Visit: Payer: Self-pay | Admitting: *Deleted

## 2012-02-22 MED ORDER — TAMSULOSIN HCL 0.4 MG PO CAPS: 0.4000 mg | ORAL_CAPSULE | Freq: Every day | ORAL | Status: DC

## 2012-02-22 MED ORDER — OMEPRAZOLE 40 MG PO CPDR: DELAYED_RELEASE_CAPSULE | ORAL | Status: DC

## 2012-02-23 ENCOUNTER — Other Ambulatory Visit: Payer: Self-pay | Admitting: *Deleted

## 2012-02-23 MED ORDER — TAMSULOSIN HCL 0.4 MG PO CAPS: 0.4000 mg | ORAL_CAPSULE | Freq: Every day | ORAL | Status: DC

## 2012-02-23 MED ORDER — OMEPRAZOLE 40 MG PO CPDR: DELAYED_RELEASE_CAPSULE | ORAL | Status: DC

## 2012-02-27 ENCOUNTER — Other Ambulatory Visit: Payer: Self-pay | Admitting: *Deleted

## 2012-02-27 MED ORDER — TAMSULOSIN HCL 0.4 MG PO CAPS: 0.4000 mg | ORAL_CAPSULE | Freq: Every day | ORAL | Status: DC

## 2012-02-27 MED ORDER — OMEPRAZOLE 40 MG PO CPDR: DELAYED_RELEASE_CAPSULE | ORAL | Status: DC

## 2012-04-13 ENCOUNTER — Encounter: Payer: Self-pay | Admitting: Family Medicine

## 2012-04-13 ENCOUNTER — Ambulatory Visit (INDEPENDENT_AMBULATORY_CARE_PROVIDER_SITE_OTHER): Payer: Medicare PPO | Admitting: Family Medicine

## 2012-04-13 VITALS — BP 140/80 | HR 83 | Temp 98.0°F | Ht 67.25 in | Wt 183.5 lb

## 2012-04-13 DIAGNOSIS — E119 Type 2 diabetes mellitus without complications: Secondary | ICD-10-CM

## 2012-04-13 DIAGNOSIS — M109 Gout, unspecified: Secondary | ICD-10-CM

## 2012-04-13 DIAGNOSIS — I10 Essential (primary) hypertension: Secondary | ICD-10-CM

## 2012-04-13 DIAGNOSIS — E7800 Pure hypercholesterolemia, unspecified: Secondary | ICD-10-CM

## 2012-04-13 NOTE — Assessment & Plan Note (Signed)
LDL at goal <70 with history of CAD.

## 2012-04-13 NOTE — Assessment & Plan Note (Addendum)
Stable control. Elevated uric acid, had one episode of possible gout flare.  If further issues consider preventative med.

## 2012-04-13 NOTE — Assessment & Plan Note (Signed)
Improving control with lifestyle changes and lantus, glipizide. Encouraged exercise, weight loss, healthy eating habits.

## 2012-04-13 NOTE — Patient Instructions (Signed)
Check blood pressure at home.. Call if above 130/80. Continue current DM regimen. Follow up for CPX in 6 months with fasting labs prior.

## 2012-04-13 NOTE — Progress Notes (Signed)
  Subjective:    Patient ID: Brian Bass, male    DOB: 1941-11-15, 71 y.o.   MRN: [id number]  HPI  Hypertension: Well controlled previously at goal on lisinopril .[name] Rushed here today Using medication without problems or lightheadedness: Was feeling dizzy With sitting to standing so stopped metoprolol 25 mg 1/2 tab BID. BP remains well controlled.  Chest pain with exertion:None  Edema:None  Short of breath:None  Average home BPs: haven;t been checking.  Other issues:   Elevated Cholesterol: LDL at goal last check in 08/2011 <70 on zocor 40 mg daily.  Using medications without problems: None Muscle aches: None Diet compliance: Good Exercise: Occ Other complaints: CAD   Diabetes: Further improvement on 100 units lantus , glipizide in AMs . Did not make changes as instructed but DM improved.  Stopped metformin... Improved diarrhea. Lab Results  Component Value Date   HGBA1C 7.2* 02/10/2012   Using medications without difficulties: None Hypoglycemic episodes: only if skips a meal Hyperglycemic episodes: None Feet problems:None  Blood Sugars averaging: See above.. Checking 2 times a day  eye exam within last year: Yes.    Review of Systems  Constitutional: Negative for fever and fatigue.  HENT: Negative for ear pain.   Eyes: Negative for pain.  Respiratory: Negative for shortness of breath.   Cardiovascular: Negative for chest pain.  Gastrointestinal: Negative for abdominal pain.       Objective:   Physical Exam  Constitutional: Vital signs are normal. He appears well-developed and well-nourished.  HENT:  Head: Normocephalic.  Right Ear: Hearing normal.  Left Ear: Hearing normal.  Nose: Nose normal.  Mouth/Throat: Oropharynx is clear and moist and mucous membranes are normal.  Neck: Trachea normal. Carotid bruit is not present. No mass and no thyromegaly present.  Cardiovascular: Normal rate, regular rhythm and normal pulses.  Exam reveals no gallop, no distant heart  sounds and no friction rub.   No murmur heard.      No peripheral edema  Pulmonary/Chest: Effort normal and breath sounds normal. No respiratory distress.  Skin: Skin is warm, dry and intact. No rash noted.  Psychiatric: He has a normal mood and affect. His speech is normal and behavior is normal. Thought content normal.   Diabetic foot exam: Normal inspection No skin breakdown No calluses  Normal DP pulses Normal sensation to light touch and monofilament Nails normal       Assessment & Plan:

## 2012-04-13 NOTE — Assessment & Plan Note (Addendum)
Borderline control, follow at home to make sure not increased. Continue current medication.

## 2012-05-12 ENCOUNTER — Other Ambulatory Visit: Payer: Self-pay | Admitting: Family Medicine

## 2012-09-13 ENCOUNTER — Telehealth: Payer: Self-pay | Admitting: *Deleted

## 2012-09-13 ENCOUNTER — Other Ambulatory Visit: Payer: Self-pay | Admitting: *Deleted

## 2012-09-13 MED ORDER — INSULIN GLARGINE 100 UNIT/ML ~~LOC~~ SOLN: 100.0000 [IU] | Freq: Every day | SUBCUTANEOUS | Status: DC

## 2012-09-13 NOTE — Telephone Encounter (Signed)
There is no generic equivalent. Is levemir is similar but he did not do as well with this... Have them call insurance to see if they cover this better.

## 2012-09-13 NOTE — Telephone Encounter (Signed)
Patient's wife called and said she found out that co-pay on Lantus is $500 due to being in donut hole. She was requesting that he be put on a generic/something different until the end of the year. I advised her that she was not listed on his HIPAA form and that I would have to call him back to advise him of any changes. She verbalized understanding and said he could be reached at [phone].

## 2012-09-14 NOTE — Telephone Encounter (Signed)
Patient notified as instructed by telephone. Pt will ck with [employer].

## 2012-10-11 ENCOUNTER — Ambulatory Visit (INDEPENDENT_AMBULATORY_CARE_PROVIDER_SITE_OTHER): Payer: Medicare PPO

## 2012-10-11 DIAGNOSIS — Z23 Encounter for immunization: Secondary | ICD-10-CM

## 2012-10-25 ENCOUNTER — Telehealth: Payer: Self-pay

## 2012-10-25 DIAGNOSIS — Z125 Encounter for screening for malignant neoplasm of prostate: Secondary | ICD-10-CM

## 2012-10-25 NOTE — Telephone Encounter (Signed)
Pt had testicle pain on and off this last year; pt request PSA to be done with labs already scheduled 11/14/12. Please advise. Pt request call back. Pt scheduled CPX 11/22/12.

## 2012-10-25 NOTE — Telephone Encounter (Signed)
We can add PSa but this would not cause teticulr pain. Needs to be evaluated... Has appt 12/19... Come in sooner if pain returning.

## 2012-10-29 NOTE — Telephone Encounter (Signed)
Patient advised.

## 2012-11-12 ENCOUNTER — Telehealth: Payer: Self-pay | Admitting: Family Medicine

## 2012-11-12 NOTE — Telephone Encounter (Signed)
Patient Information:  Caller Name: [name]  Phone: [phone]  Patient: Brian Bass, Brian Bass  Gender: Male  DOB: [birth year]  Age: 71 Years  PCP: [name] (Family Practice)   Symptoms  Reason For Call & Symptoms: Called to ask date of last PSA and if it will be done at time of lab 11/14/12. Obtained verbal permission from Ayce to give information to wife. Advised last PSA was done 02/01/11.  Per Epic notes, MD already added PSA to upcoming lab orders.  Reviewed Health History In EMR: N/A  Reviewed Medications In EMR: N/A  Reviewed Allergies In EMR: N/A  Reviewed Surgeries / Procedures: N/A  Date of Onset of Symptoms: Unknown  Guideline(s) Used:  No Protocol Available - Information Only  Disposition Per Guideline:   Home Care  Reason For Disposition Reached:   Information only question and nurse able to answer  Advice Given:  N/A  Office Follow Up:  Does the office need to follow up with this patient?: No  Instructions For The Office: N/A  RN Note:  Information provided.

## 2012-11-14 ENCOUNTER — Other Ambulatory Visit (INDEPENDENT_AMBULATORY_CARE_PROVIDER_SITE_OTHER): Payer: Medicare PPO

## 2012-11-14 DIAGNOSIS — Z125 Encounter for screening for malignant neoplasm of prostate: Secondary | ICD-10-CM

## 2012-11-22 ENCOUNTER — Ambulatory Visit (INDEPENDENT_AMBULATORY_CARE_PROVIDER_SITE_OTHER): Payer: Medicare PPO | Admitting: Family Medicine

## 2012-11-22 ENCOUNTER — Encounter: Payer: Self-pay | Admitting: Family Medicine

## 2012-11-22 VITALS — BP 120/74 | HR 77 | Temp 97.4°F | Ht 67.25 in | Wt 178.8 lb

## 2012-11-22 DIAGNOSIS — K1370 Unspecified lesions of oral mucosa: Secondary | ICD-10-CM

## 2012-11-22 DIAGNOSIS — K1379 Other lesions of oral mucosa: Secondary | ICD-10-CM

## 2012-11-22 DIAGNOSIS — R0989 Other specified symptoms and signs involving the circulatory and respiratory systems: Secondary | ICD-10-CM

## 2012-11-22 DIAGNOSIS — I10 Essential (primary) hypertension: Secondary | ICD-10-CM

## 2012-11-22 DIAGNOSIS — E119 Type 2 diabetes mellitus without complications: Secondary | ICD-10-CM

## 2012-11-22 DIAGNOSIS — Z0000 Encounter for general adult medical examination without abnormal findings: Secondary | ICD-10-CM

## 2012-11-22 DIAGNOSIS — E7800 Pure hypercholesterolemia, unspecified: Secondary | ICD-10-CM

## 2012-11-22 LAB — LIPID PANEL
Cholesterol: 150 mg/dL (ref 0–200)
HDL: 33.5 mg/dL — ABNORMAL LOW (ref >39.00)
Total CHOL/HDL Ratio: 4
Triglycerides: 389 mg/dL — ABNORMAL HIGH (ref 0.0–149.0)
VLDL: 77.8 mg/dL — ABNORMAL HIGH (ref 0.0–40.0)

## 2012-11-22 LAB — COMPREHENSIVE METABOLIC PANEL
AST: 33 U/L (ref 0–37)
Alkaline Phosphatase: 59 U/L (ref 39–117)
BUN: 18 mg/dL (ref 6–23)
Glucose, Bld: 166 mg/dL — ABNORMAL HIGH (ref 70–99)
Potassium: 4.5 mEq/L (ref 3.5–5.1)
Total Bilirubin: 0.5 mg/dL (ref 0.3–1.2)

## 2012-11-22 LAB — HEMOGLOBIN A1C: Hgb A1c MFr Bld: 8.6 % — ABNORMAL HIGH (ref 4.6–6.5)

## 2012-11-22 NOTE — Assessment & Plan Note (Signed)
Poor control. Restart lantus. Re check A1C.

## 2012-11-22 NOTE — Assessment & Plan Note (Signed)
Well controlled. Continue current medication.  

## 2012-11-22 NOTE — Assessment & Plan Note (Signed)
Due for re-eval. 

## 2012-11-22 NOTE — Progress Notes (Signed)
I have personally reviewed the Medicare Annual Wellness questionnaire and have noted  1. The patient's medical and social history  2. Their use of alcohol, tobacco or illicit drugs  3. Their current medications and supplements  4. The patient's functional ability including ADL's, fall risks, home safety risks and hearing or visual  impairment.  5. Diet and physical activities  6. Evidence for depression or mood disorders   The patients weight, height, BMI and visual acuity have been recorded in the chart  I have made referrals, counseling and provided education to the patient based review of the above and I have provided the pt with a written personalized care plan for preventive services.   Hypertension: Well controlled at goal on lisinopril  Using medication without problems or lightheadedness: Was feeling dizzy With sitting to standing so stopped metoprolol 25 mg 1/2 tab BID. BP remains well controlled.  Chest pain with exertion:None  Edema:None  Short of breath:None  Average home BPs: 120/70  Other issues:   Elevated Cholesterol: Due for re-eval. Using medications without problems:  Muscle aches:  Diet compliance:  Exercise:  Other complaints: CAD   Diabetes: Due for re-eval. Ran out of Lantus for 2 months given insurance. He is back on Lantus now. On metformin 1000 mg twice daily.[name] Unsure if fast acting or slow. Using medications without difficulties:  Hypoglycemic episodes: none Hyperglycemic episodes: 220-250 fasting Feet problems:None  Blood Sugars averaging: See above.. Checking 2 times a day, eye exam within last year: Yes  BPH: off tamsulosin   Controlled on Review of Systems  Constitutional: Negative for fever, fatigue and unexpected weight change.  HENT: Negative for ear pain, congestion, sore throat, rhinorrhea, trouble swallowing and postnasal drip.  Eyes: Negative for pain.  Respiratory: Negative for cough, shortness of breath and wheezing.  Cardiovascular:  Negative for chest pain, palpitations and leg swelling.  Gastrointestinal: Negative for nausea, abdominal pain, diarrhea, constipation and blood in stool.  Genitourinary: Positive for urgency, frequency and difficulty urinating. Negative for dysuria, hematuria, discharge, penile swelling, scrotal swelling, penile pain and testicular pain.  Skin: Negative for rash.  Neurological: Negative for syncope, weakness, light-headedness, numbness and headaches.  Psychiatric/Behavioral: Negative for behavioral problems and dysphoric mood. The patient is not nervous/anxious.    Objective:   Physical Exam  Constitutional: He appears well-developed and well-nourished. Non-toxic appearance. He does not appear ill. No distress.  HENT:  Head: Normocephalic and atraumatic.  Right Ear: Hearing, tympanic membrane, external ear and ear canal normal.  Left Ear: Hearing, tympanic membrane, external ear and ear canal normal.  Nose: Nose normal.  Mouth/Throat: Uvula is midline, oropharynx is clear and moist and mucous membranes are normal.  Eyes: Conjunctivae, EOM and lids are normal. Pupils are equal, round, and reactive to light. No foreign bodies found.  Neck: Trachea normal, normal range of motion and phonation normal. Neck supple. Carotid bruit is not present. No mass and no thyromegaly present.  Cardiovascular: Normal rate, regular rhythm, S1 normal, S2 normal, intact distal pulses and normal pulses. Exam reveals no gallop.  No murmur heard.  Pulmonary/Chest: He has no wheezes. He has no rhonchi. He has rales the left lower field.  None on right this year. Chronic and stable.  Abdominal: Soft. Normal appearance and bowel sounds are normal. There is no hepatosplenomegaly. There is no tenderness. There is no rebound, no guarding and no CVA tenderness. No hernia.  Lymphadenopathy:  He has no cervical adenopathy.  Neurological: He is alert. He has normal  strength and normal reflexes. No cranial nerve deficit or  sensory deficit. Gait normal.  Skin: Skin is warm, dry and intact. No rash noted.  Psychiatric: He has a normal mood and affect. His speech is normal and behavior is normal. Judgment normal.   Diabetic foot exam:  Normal inspection  No skin breakdown  No calluses  Normal DP pulses  Normal sensation to light touch and monofilament  Nails normal   Assessment & Plan:   Annual Medicare Wellness: The patient's preventative maintenance and recommended screening tests for an annual wellness exam were reviewed in full today.  Brought up to date unless services declined.  Counselled on the importance of diet, exercise, and its role in overall health and mortality.  The patient's FH and SH was reviewed, including their home life, tobacco status, and drug and alcohol status.   Prostate cancer: PSA requested by pt.  Stable. Lab Results  Component Value Date   PSA 1.88 11/14/2012   PSA 1.39 02/01/2011   PSA 1.58 04/20/2010   Vaccines: Up to date with flu, Tdap, PNA, offered shingles vaccine.  Colonoscopy: 2008 adenomatous polyps [facility], due 2013. Nonsmoker.

## 2012-11-22 NOTE — Patient Instructions (Addendum)
Stop by lab on your way out.  Due for colonoscopy colon cancer screening this year. Call if any shortness of breath , cough or chest wall pain.

## 2012-11-26 ENCOUNTER — Encounter: Payer: Self-pay | Admitting: *Deleted

## 2012-11-26 ENCOUNTER — Encounter: Payer: Self-pay | Admitting: Family Medicine

## 2012-11-29 ENCOUNTER — Telehealth: Payer: Self-pay | Admitting: Family Medicine

## 2012-11-29 NOTE — Telephone Encounter (Signed)
Patient's wife is calling. She wants to know if he needs follow up bloodwork before his 3 month follow up appt in March.  Please call her on their home number.

## 2012-12-04 NOTE — Telephone Encounter (Signed)
Left vm for pt to callback 

## 2012-12-04 NOTE — Telephone Encounter (Signed)
He need a lab appt a few days prior to his 3 month follow up as usual. Fasting

## 2012-12-04 NOTE — Telephone Encounter (Signed)
Patient notified as instructed by telephone. Pt will call back to schedule appts.

## 2012-12-07 ENCOUNTER — Other Ambulatory Visit: Payer: Self-pay | Admitting: *Deleted

## 2012-12-14 ENCOUNTER — Telehealth: Payer: Self-pay | Admitting: Family Medicine

## 2012-12-14 NOTE — Telephone Encounter (Signed)
Caller: [name]/Spouse; Phone: [phone]; Reason for Call: Wife states that her husband Manning gets his medication from [facility] (Mail order).  They are requesting a prescription for his needles - Insulin Syringe BD Ultrafine Needles.  28m  #8 MM gauge.  31 G /// 10 Sterile Single Use Syringes for U-100 Insulin .  10 to a pack.    THIS WAS ORDERED WRONG BEFORE. THEY ARE PLANNING TO SEND A FAX .  THEY DO NOT USE DIAL UP . THEY USE REGULAR SYRINGE NEEDLE. PLEASE CONTACT WIFE-  ([phone]

## 2012-12-17 MED ORDER — "INSULIN SYRINGE-NEEDLE U-100 31G X 5/16"" 1 ML MISC": Status: DC

## 2012-12-17 NOTE — Telephone Encounter (Signed)
rx sent to pharmacy

## 2013-02-21 ENCOUNTER — Telehealth: Payer: Self-pay | Admitting: Family Medicine

## 2013-02-21 ENCOUNTER — Other Ambulatory Visit (INDEPENDENT_AMBULATORY_CARE_PROVIDER_SITE_OTHER): Payer: Medicare PPO

## 2013-02-21 DIAGNOSIS — E7800 Pure hypercholesterolemia, unspecified: Secondary | ICD-10-CM

## 2013-02-21 DIAGNOSIS — E119 Type 2 diabetes mellitus without complications: Secondary | ICD-10-CM

## 2013-02-21 LAB — COMPREHENSIVE METABOLIC PANEL
ALT: 30 U/L (ref 0–53)
AST: 30 U/L (ref 0–37)
Albumin: 4.3 g/dL (ref 3.5–5.2)
Alkaline Phosphatase: 46 U/L (ref 39–117)
Glucose, Bld: 163 mg/dL — ABNORMAL HIGH (ref 70–99)
Potassium: 4.5 mEq/L (ref 3.5–5.1)
Sodium: 138 mEq/L (ref 135–145)
Total Protein: 7.2 g/dL (ref 6.0–8.3)

## 2013-02-21 LAB — LIPID PANEL
Total CHOL/HDL Ratio: 5
VLDL: 54.8 mg/dL — ABNORMAL HIGH (ref 0.0–40.0)

## 2013-02-21 LAB — LDL CHOLESTEROL, DIRECT: Direct LDL: 73.9 mg/dL

## 2013-02-21 LAB — HEMOGLOBIN A1C: Hgb A1c MFr Bld: 8.3 % — ABNORMAL HIGH (ref 4.6–6.5)

## 2013-02-21 NOTE — Telephone Encounter (Signed)
Message copied by Jinny Sanders on Thu Feb 21, 2013 12:08 AM ------      Message from: Brian Bass      Created: Fri Feb 15, 2013 10:21 AM      Regarding: Lab orders for Wednesday, 3.19.14       Labs for 3 month f/u ------

## 2013-02-28 ENCOUNTER — Encounter: Payer: Self-pay | Admitting: Family Medicine

## 2013-02-28 ENCOUNTER — Ambulatory Visit (INDEPENDENT_AMBULATORY_CARE_PROVIDER_SITE_OTHER): Payer: Medicare PPO | Admitting: Family Medicine

## 2013-02-28 VITALS — BP 120/72 | HR 75 | Temp 97.4°F | Ht 67.25 in | Wt 185.0 lb

## 2013-02-28 DIAGNOSIS — E7800 Pure hypercholesterolemia, unspecified: Secondary | ICD-10-CM

## 2013-02-28 DIAGNOSIS — E119 Type 2 diabetes mellitus without complications: Secondary | ICD-10-CM

## 2013-02-28 DIAGNOSIS — I10 Essential (primary) hypertension: Secondary | ICD-10-CM

## 2013-02-28 DIAGNOSIS — I2510 Atherosclerotic heart disease of native coronary artery without angina pectoris: Secondary | ICD-10-CM

## 2013-02-28 NOTE — Assessment & Plan Note (Signed)
Pt noncompliant with meds. Not interested in increasing metformin back given diarrhea at higher dose. He states " if I eat right this issue would not be here" He wishes to try aggressive diet, but sinccde SE to insulin ( occ itching ) is mild he will titrate insulin back up.  Offered [facility] referral.. Refused. Discussed adding byetta or vicotza, but he is not interested. He understands complications of DM possible including CAD, recurrent MI as well as renal failure.

## 2013-02-28 NOTE — Assessment & Plan Note (Signed)
Well controlled. Continue current medication.

## 2013-02-28 NOTE — Assessment & Plan Note (Signed)
Asymptomatic.

## 2013-02-28 NOTE — Progress Notes (Signed)
  Subjective:    Patient ID: Brian Bass, male    DOB: 1941-04-12, 72 y.o.   MRN: [id number]  HPI  72 year old male with CAD and poorly controlled DM presents for 3 month follow up.  Hypertension: Well controlled at goal on lisinopril  Using medication without problems or lightheadedness: Was feeling dizzy With sitting to standing so stopped metoprolol 25 mg 1/2 tab BID. BP remains well controlled.  Chest pain with exertion:None  Edema:None  Short of breath:None  Average home BPs: 120/70 Other issues:   Elevated Cholesterol:  LDL almost at goal ,70on zocor 40 mg daily. Trigs high and HDL low. Lab Results  Component Value Date   CHOL 139 02/21/2013   HDL 30.50* 02/21/2013   LDLCALC 52 02/10/2012   LDLDIRECT 73.9 02/21/2013   TRIG 274.0* 02/21/2013   CHOLHDL 5 02/21/2013    Using medications without problems: None Muscle aches: None Diet compliance:  Poor...eats a lot of pasta potatos, breads. Exercise:  1 mile walking a day with dog Other complaints: CAD   Diabetes: On Lantus Units  60 daily . He has been cutting down dose on his own because he has been having some pain and itching. He feels like it has helped.  He also cut metformin down to half previous dose.Marland KitchenMarland KitchenMarland KitchenOnly metformin 1000 mg ONCE daily.  Also taking glipizide 10 mg max dose.  A1c is improved some but still far from goal.  Lab Results  Component Value Date   HGBA1C 8.3* 02/21/2013  Using medications without difficulties:  Hypoglycemic episodes: none  Hyperglycemic episodes: 160 fasting, few times >220   Feet problems:None  Blood Sugars averaging: See above.. Checking 2 times a day,  eye exam within last year: Yes    He is going to [place] for 6 months... Cannot come back until after that for follow up.   Review of Systems  Constitutional: Negative for fever and fatigue.  HENT: Negative for ear pain.   Eyes: Negative for pain.  Respiratory: Negative for choking, shortness of breath and wheezing.    Cardiovascular: Negative for chest pain, palpitations and leg swelling.  Gastrointestinal: Negative for abdominal pain.       Objective:   Physical Exam  Constitutional: Vital signs are normal. He appears well-developed and well-nourished.  HENT:  Head: Normocephalic.  Right Ear: Hearing normal.  Left Ear: Hearing normal.  Nose: Nose normal.  Mouth/Throat: Oropharynx is clear and moist and mucous membranes are normal.  Neck: Trachea normal. Carotid bruit is not present. No mass and no thyromegaly present.  Cardiovascular: Normal rate, regular rhythm and normal pulses.  Exam reveals no gallop, no distant heart sounds and no friction rub.   No murmur heard. No peripheral edema  Pulmonary/Chest: Effort normal and breath sounds normal. No respiratory distress.  Skin: Skin is warm, dry and intact. No rash noted.  Psychiatric: He has a normal mood and affect. His speech is normal and behavior is normal. Thought content normal.    Diabetic foot exam: Normal inspection No skin breakdown No calluses  Normal DP pulses Normal sensation to light touch and monofilament Nails normal       Assessment & Plan:

## 2013-02-28 NOTE — Assessment & Plan Note (Signed)
LDL at goal , tri and HDL not ideal. Take fish oil.

## 2013-02-28 NOTE — Patient Instructions (Addendum)
Try to take fish oil 2000 divided daily. Change to whole wheat bread, replace white potatos with sweet potato. Decrease french fries, rice.  Work on [employer] in diet.  Increase insulin back up as tolerated slowly.. Goal fasting blood sugar <120. Goal 2 hour after meals < 180.  Okay to stay on lower dose metformin.  Follow up in 6 months with labs prior.

## 2013-03-13 ENCOUNTER — Telehealth: Payer: Self-pay | Admitting: Family Medicine

## 2013-03-13 MED ORDER — GLIPIZIDE 10 MG PO TABS: 10.0000 mg | ORAL_TABLET | Freq: Two times a day (BID) | ORAL | Status: DC

## 2013-03-13 MED ORDER — METFORMIN HCL 1000 MG PO TABS: 1000.0000 mg | ORAL_TABLET | Freq: Two times a day (BID) | ORAL | Status: DC

## 2013-03-13 NOTE — Telephone Encounter (Signed)
Medication sent to pharmacy  

## 2013-03-13 NOTE — Telephone Encounter (Signed)
Wife [name] calling about patient's prescriptions for diabetes.  Last year he did not take Insulin for 2 months because cost was too high and he was on Medicare in the donut hole and did not have enough money for Rx. Trying to prevent this happening this year.  Asking that his prescriptions for Metformin 1000 mg BID and Glipizide 10 mg BID be called to [facility]  [phone] (these mediations at this pharmacy will be free).  Is due for refills and going out of town next week. If questions please contact caller at  [phone].

## 2013-04-09 LAB — HM DIABETES EYE EXAM

## 2013-05-03 ENCOUNTER — Encounter: Payer: Self-pay | Admitting: Family Medicine

## 2013-05-06 ENCOUNTER — Other Ambulatory Visit: Payer: Self-pay | Admitting: *Deleted

## 2013-05-06 MED ORDER — OMEPRAZOLE 40 MG PO CPDR: DELAYED_RELEASE_CAPSULE | ORAL | Status: DC

## 2013-05-08 ENCOUNTER — Other Ambulatory Visit: Payer: Self-pay | Admitting: *Deleted

## 2013-05-08 ENCOUNTER — Telehealth: Payer: Self-pay

## 2013-05-08 MED ORDER — OMEPRAZOLE 40 MG PO CPDR: DELAYED_RELEASE_CAPSULE | ORAL | Status: DC

## 2013-05-08 NOTE — Telephone Encounter (Signed)
[facility] (After Hours Triage) Fax: [phone] From: [facility] Date/ Time: 05/07/2013 5:21 PM Taken By: Osvaldo Human: Brian Bass: home Patient: Brian, Bass DOB: 1941-08-05 Phone: [phone] Reason for Call: Patient is needing refill on medication. Pharmacy has faxed over info. Patient is needing rx for Omeprazole 52m. Regarding Appointment: Appt Date: Appt Time: Unknown Provider: Reason: Details: Outcome:

## 2013-05-08 NOTE — Telephone Encounter (Signed)
rx cent to pharmacy

## 2013-05-08 NOTE — Telephone Encounter (Signed)
Per pt pharmacy( right source)  denies receiving rx, I resent it electronically.

## 2013-07-08 ENCOUNTER — Telehealth: Payer: Self-pay | Admitting: Family Medicine

## 2013-07-08 DIAGNOSIS — Z86010 Personal history of colonic polyps: Secondary | ICD-10-CM

## 2013-07-08 NOTE — Telephone Encounter (Signed)
Patients wife called to ask you to please refer her husband to [facility]. He needs a new GI Dr because the Dr at [facility] is no longer there and they dont have another Dr to do this. He gets a colonoscopy  every 3  Years and he is due for this in September 2014 due to having a history of Colon Polyps. Please also ask the GI group if this patient will need to see a Dr first before having the procedure as he history of Heart Bypass.Please call his wife Brian Bass back when the Referral is in Epic to get scheduled. Patient wants to go away the middle of September so would like to get it done ASAP.

## 2013-07-09 ENCOUNTER — Encounter: Payer: Self-pay | Admitting: Internal Medicine

## 2013-07-09 NOTE — Telephone Encounter (Signed)
Previous records received and Colonoscopy set up with [facility], records faxed to GI.

## 2013-08-07 ENCOUNTER — Encounter: Payer: Self-pay | Admitting: Internal Medicine

## 2013-08-08 ENCOUNTER — Encounter: Payer: Self-pay | Admitting: Internal Medicine

## 2013-08-08 ENCOUNTER — Ambulatory Visit (INDEPENDENT_AMBULATORY_CARE_PROVIDER_SITE_OTHER): Payer: Medicare PPO | Admitting: Internal Medicine

## 2013-08-08 VITALS — BP 140/70 | HR 80 | Ht 68.0 in | Wt 182.1 lb

## 2013-08-08 DIAGNOSIS — Z8719 Personal history of other diseases of the digestive system: Secondary | ICD-10-CM

## 2013-08-08 DIAGNOSIS — K219 Gastro-esophageal reflux disease without esophagitis: Secondary | ICD-10-CM

## 2013-08-08 DIAGNOSIS — Z86010 Personal history of colonic polyps: Secondary | ICD-10-CM

## 2013-08-08 MED ORDER — OMEPRAZOLE 40 MG PO CPDR: DELAYED_RELEASE_CAPSULE | ORAL | Status: DC

## 2013-08-08 NOTE — Patient Instructions (Addendum)
We have sent the following medications to your pharmacy for you to pick up at your convenience: Omeprazole  Follow up as needed.  You will be due for a colonoscopy in 2016 we will notify you by mail                                                We are excited to introduce MyChart, a new best-in-class service that provides you online access to important information in your electronic medical record. We want to make it easier for you to view your health information - all in one secure location - when and where you need it. We expect MyChart will enhance the quality of care and service we provide.  When you register for MyChart, you can:    View your test results.    Request appointments and receive appointment reminders via email.    Request medication renewals.    View your medical history, allergies, medications and immunizations.    Communicate with your physician's office through a password-protected site.    Conveniently print information such as your medication lists.  To find out if MyChart is right for you, please talk to a member of our clinical staff today. We will gladly answer your questions about this free health and wellness tool.  If you are age 73 or older and want a member of your family to have access to your record, you must provide written consent by completing a proxy form available at our office. Please speak to our clinical staff about guidelines regarding accounts for patients younger than age 15.  As you activate your MyChart account and need any technical assistance, please call the MyChart technical support line at [phone]) or email your question to [email or web address]. If you email your question(s), please include your name, a return phone number and the best time to reach you.  If you have non-urgent health-related questions, you can send a message to our office through [place] at [email or web address]. If you have a medical  emergency, call 911.  Thank you for using MyChart as your new health and wellness resource!   MyChart licensed from [employer],  1999-2010. Patents Pending.

## 2013-08-08 NOTE — Progress Notes (Signed)
Patient ID: Brian Bass, male   DOB: 1941/06/02, 72 y.o.   MRN: [id number] HPI: Brian Bass is a 72 year old male with a past medical history of diabetes, hyperlipidemia, GERD, adenomatous colon polyps who is seen in consultation at the request of Dr. Diona Browner to discuss colon polyp surveillance. He is here alone today. He reports he is feeling well. He denies abdominal pain today. He reports his bowel movements have been normal, and less he takes his metformin. We does use metformin he has loose stools, but otherwise no diarrhea or constipation. He denies rectal bleeding or melena. No nausea or vomiting. He does have a history of heartburn but reports this is well controlled with omeprazole 40 mg daily. He does have a history of dysphagia status post esophageal dilation. He currently reports his swallowing is okay. He very rarely has issues with "chicken or dry food", but he does not feel it is severe enough to undergo recurrent endoscopy at this time. He has no odynophagia. No weight loss. No early satiety.  Past Medical History  Diagnosis Date  . Diabetes mellitus   . IBS (irritable bowel syndrome)   . Hyperlipidemia   . GERD (gastroesophageal reflux disease)   . Esophageal stricture   . Adenomatous colon polyp   . History of colon polyps 922/2011    Past Surgical History  Procedure Laterality Date  . Coronary artery bypass graft  10-2010    times 3     Current Outpatient Prescriptions  Medication Sig Dispense Refill  . aspirin 325 MG tablet Take 81 mg by mouth daily.       [name] glipiZIDE (GLUCOTROL) 10 MG tablet Take 1 tablet (10 mg total) by mouth 2 (two) times daily before a meal.  60 tablet  11  . Glucosamine & Fish Oil 500-400-60-40 MG CAPS Take 1 capsule by mouth daily.        . insulin glargine (LANTUS) 100 UNIT/ML injection Inject 100 Units into the skin daily.  60 vial  3  . Insulin Pen Needle 29G X 12.7MM MISC by Does not apply route.        . Insulin Syringe-Needle U-100  (B-D INS SYR ULTRAFINE 1CC/31G) 31G X 5/16" 1 ML MISC Use as directed  100 each  3  . Insulin Syringe-Needle U-100 (INS SYR UL THIN SHORT 1CC/30G) 30G X 5/16" 1 ML MISC by Does not apply route.        . metFORMIN (GLUCOPHAGE) 1000 MG tablet Take 1 tablet (1,000 mg total) by mouth 2 (two) times daily with a meal.  60 tablet  11  . Multiple Vitamin (MULTIVITAMIN) tablet Take 1 tablet by mouth daily.        [name] omeprazole (PRILOSEC) 40 MG capsule Take one by mouth daily  90 capsule  3  . simvastatin (ZOCOR) 40 MG tablet TAKE 1 TABLET (40 MG TOTAL) AT BEDTIME  90 tablet  PRN   No current facility-administered medications for this visit.    No Known Allergies  Family History  Problem Relation Age of Onset  . Heart failure Mother   . Aneurysm Father   . Alcohol abuse Brother     History  Substance Use Topics  . Smoking status: Never Smoker   . Smokeless tobacco: Never Used  . Alcohol Use: No    ROS: As per history of present illness, otherwise negative  BP 140/70  Pulse 80  Ht _0  (1.727 m)  Wt 182 lb 2 oz (82.611 kg)  BMI  27.7 kg/m2 Constitutional: Well-developed and well-nourished. No distress. HEENT: Normocephalic and atraumatic. Oropharynx is clear and moist. No oropharyngeal exudate. Conjunctivae are normal.  No scleral icterus. Neck: Neck supple. Trachea midline. Cardiovascular: Normal rate, regular rhythm and intact distal pulses. No M/R/G Pulmonary/chest: Effort normal and breath sounds normal. No wheezing, rales or rhonchi. Abdominal: Soft, nontender, nondistended. Bowel sounds active throughout. There are no masses palpable. No hepatosplenomegaly. Extremities: no clubbing, cyanosis, or edema Lymphadenopathy: No cervical adenopathy noted. Neurological: Alert and oriented to person place and time. Skin: Skin is warm and dry. No rashes noted. Psychiatric: Normal mood and affect. Behavior is normal.  RELEVANT LABS AND PROCEDURES:  CMP     Component Value Date/Time    NA 138 02/21/2013 0837   K 4.5 02/21/2013 0837   CL 104 02/21/2013 0837   CO2 25 02/21/2013 0837   GLUCOSE 163* 02/21/2013 0837   BUN 14 02/21/2013 0837   CREATININE 1.3 02/21/2013 0837   CALCIUM 9.3 02/21/2013 0837   PROT 7.2 02/21/2013 0837   ALBUMIN 4.3 02/21/2013 0837   AST 30 02/21/2013 0837   ALT 30 02/21/2013 0837   ALKPHOS 46 02/21/2013 0837   BILITOT 0.8 02/21/2013 0837   GFRNONAA 64.17 11/04/2010 0840   Prior endoscopy records reviewed, colonoscopies performed by Dr. Anselmo Pickler, [facility] dated 08/26/2010, examined the cecum, quality of the bowel preparation was good. Impression one 5 mm polyp in the proximal ascending colon, resected and retrieved. Diverticulosis sigmoid colon. Internal hemorrhoids. Pathology = tumor adenoma without high-grade dysplasia or malignancy. Colonoscopy dated 04/09/2007, exam to the cecum quality of the preparation was good. Impression one 9 mm nonbleeding polyp in the transverse colon. Resected and retrieved. Pathology = Tubular adenoma w.ithout high-grade dysplasia or malignancy  ASSESSMENT/PLAN: 72 year old male with a past medical history of diabetes, hyperlipidemia, GERD, adenomatous colon polyps who is seen in consultation at the request of Dr. Diona Browner to discuss colon polyp surveillance.  1. History of adenomatous colon polyps --  I have reviewed the patient's 2 previous colonoscopies and discussed these with him today. He has had one, less than 1 cm, tubular adenoma without high-grade dysplasia on each of his 2 previous colonoscopies. We discussed how current guidelines recommend surveillance colonoscopy in 5 years. This is based on the number and size of his previous polyp. Also of note the colonoscopies were complete the cecum was good preparation. There is no reason not identified, to perform the examination earlier than current guidelines recommend. He fully understands and is in agreement with this recommendation. He'll be due repeat screening  colonoscopy in September 2016. A recall was placed today.  He will call with any problems or concerns before this time if necessary  2.  GERD With history of esophageal stricture -- he has good control of his GERD without breakthrough symptoms on daily PPI. He will continue omeprazole 40 mg daily. Dysphagia is not a significant problem for him at this time and he did not feel dilation as necessary. Based on his current symptoms, I agree. We have discussed how he may require subsequent esophageal dilation if his dysphagia progresses. There are no other upper GI alarm symptoms. He is asked to call if dysphagia worsens, and he voices understanding.

## 2013-08-19 ENCOUNTER — Encounter: Payer: Self-pay | Admitting: Family Medicine

## 2013-08-19 ENCOUNTER — Ambulatory Visit (INDEPENDENT_AMBULATORY_CARE_PROVIDER_SITE_OTHER): Payer: Medicare PPO | Admitting: Family Medicine

## 2013-08-19 VITALS — BP 122/80 | HR 80 | Temp 98.3°F | Ht 68.0 in | Wt 182.0 lb

## 2013-08-19 DIAGNOSIS — M7581 Other shoulder lesions, right shoulder: Secondary | ICD-10-CM

## 2013-08-19 DIAGNOSIS — M67919 Unspecified disorder of synovium and tendon, unspecified shoulder: Secondary | ICD-10-CM

## 2013-08-19 NOTE — Patient Instructions (Addendum)
BODYHELIX  [email or web address]  Use website instuctions for measurement of limb to determine size.    [place]

## 2013-08-19 NOTE — Progress Notes (Signed)
Therapist, music at [facility] [place] [address] Phone: [phone] Fax: [phone]  Date:  08/19/2013   Name:  Brian Bass   DOB:  March 14, 1941   MRN:  [id number] Gender: male Age: 72 y.o.  Primary Physician:  Brian Lofts, MD  Evaluating MD: Brian Loffler, MD   Chief Complaint: Shoulder Pain   History of Present Illness:  Brian Bass is a 72 y.o. pleasant patient who presents with the following:  R shoulder, been hurting for about a week. Was doing some carpentry.  Occ farm work. Pain with abd and IROM and with overhead work on his house. No prior surgery or fracture. No dislocation.  E rtc tend  Patient Active Problem List   Diagnosis Date Noted  . Rales 11/22/2012  . GOUT, UNSPECIFIED 12/03/2009  . C A D 11/02/2009  . DIABETES MELLITUS, TYPE II, WITHOUT COMPLICATIONS 46/27/0350  . HYPERCHOLESTEROLEMIA 08/26/2009  . ESSENTIAL HYPERTENSION, BENIGN 08/26/2009  . OSTEOARTHRITIS, MULTIPLE JOINTS 08/26/2009  . BENIGN PROSTATIC HYPERTROPHY, MILD, HX OF 08/26/2009  . ESOPHAGEAL STRICTURE 10/14/2008  . GERD 10/14/2008  . GASTRITIS 09/16/2008  . IRRITABLE BOWEL SYNDROME 09/02/2008  . PERSONAL HX COLONIC POLYPS 09/02/2008    Past Medical History  Diagnosis Date  . Diabetes mellitus   . IBS (irritable bowel syndrome)   . Hyperlipidemia   . GERD (gastroesophageal reflux disease)   . Esophageal stricture   . Adenomatous colon polyp   . History of colon polyps 922/2011    Past Surgical History  Procedure Laterality Date  . Coronary artery bypass graft  10-2010    times 3     History   Social History  . Marital Status: Married    Spouse Name: N/A    Number of Children: N/A  . Years of Education: N/A   Occupational History  . 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 Smoking status: Never Smoker   . Smokeless tobacco: Never Used  . Alcohol Use: No  . Drug Use: No  . Sexual Activity: Not on file    Other Topics Concern  . Not on file   Social History Narrative   Patient does not get regular exercise, but manual labor with farm work   Diet: fruits and veggies, fish, chicken, water    HCPOA: wife, full code, no living will (reviewed 2013)    Family History  Problem Relation Age of Onset  . Heart failure Mother   . Aneurysm Father   . Alcohol abuse Brother     No Known Allergies  Medication list has been reviewed and updated.  Outpatient Prescriptions Prior to Visit  Medication Sig Dispense Refill  . glipiZIDE (GLUCOTROL) 10 MG tablet Take 1 tablet (10 mg total) by mouth 2 (two) times daily before a meal.  60 tablet  11  . Glucosamine & Fish Oil 500-400-60-40 MG CAPS Take 1 capsule by mouth daily.        . insulin glargine (LANTUS) 100 UNIT/ML injection Inject 100 Units into the skin daily.  60 vial  3  . Insulin Pen Needle 29G X 12.7MM MISC by Does not apply route.        . Insulin Syringe-Needle U-100 (B-D INS SYR ULTRAFINE 1CC/31G) 31G X 5/16" 1 ML MISC Use as directed  100 each  3  . metFORMIN (GLUCOPHAGE) 1000 MG tablet Take 1 tablet (1,000 mg total) by mouth 2 (two) times daily with a meal.  60  tablet  11  . Multiple Vitamin (MULTIVITAMIN) tablet Take 1 tablet by mouth daily.        [name] omeprazole (PRILOSEC) 40 MG capsule Take one by mouth daily  90 capsule  3  . simvastatin (ZOCOR) 40 MG tablet TAKE 1 TABLET (40 MG TOTAL) AT BEDTIME  90 tablet  PRN  . aspirin 325 MG tablet Take 81 mg by mouth daily.       . Insulin Syringe-Needle U-100 (INS SYR UL THIN SHORT 1CC/30G) 30G X 5/16" 1 ML MISC by Does not apply route.         No facility-administered medications prior to visit.    Review of Systems:   GEN: No fevers, chills. Nontoxic. Primarily MSK c/o today. MSK: Detailed in the HPI GI: tolerating PO intake without difficulty Neuro: No numbness, parasthesias, or tingling associated. Otherwise the pertinent positives of the ROS are noted above.    Physical  Examination: BP 122/80  Pulse 80  Temp(Src) 98.3 F (36.8 C) (Oral)  Ht _0  (1.727 m)  Wt 182 lb (82.555 kg)  BMI 27.68 kg/m2  Ideal Body Weight: Weight in (lb) to have BMI = 25: 164.1   GEN: Well-developed,well-nourished,in no acute distress; alert,appropriate and cooperative throughout examination HEENT: Normocephalic and atraumatic without obvious abnormalities. Ears, externally no deformities PULM: Breathing comfortably in no respiratory distress EXT: No clubbing, cyanosis, or edema PSYCH: Normally interactive. Cooperative during the interview. Pleasant. Friendly and conversant. Not anxious or depressed appearing. Normal, full affect.  Shoulder: R Inspection: No muscle wasting or winging Ecchymosis/edema: neg  AC joint, scapula, clavicle: NT Cervical spine: NT, full ROM Spurling's: neg Abduction: full, 4+/5 Flexion: full, 5/5 IR, full, lift-off: 5/5 ER at neutral: full, 5/5 AC crossover: neg Neer: pos Hawkins: pos Drop Test: neg Empty Can: pos Supraspinatus insertion: mild-mod T Bicipital groove: NT Speed's: neg Yergason's: neg Sulcus sign: neg Scapular dyskinesis: none C5-T1 intact  Prior prox biceps rupture  Neuro: Sensation intact Grip 5/5   Assessment and Plan:  Rotator cuff tendonitis, right  Reviewed path. Below nose.  Arm band for work  [employer], RIGHT Verbal consent was obtained from the patient. Risks (including rare infection), benefits, and alternatives were explained. Patient prepped with Chloraprep and Ethyl Chloride used for anesthesia. The subacromial space was injected using the posterior approach. The patient tolerated the procedure well and had decreased pain post injection. No complications. Injection: 8 cc of Lidocaine 1% and 2 cc of Depo-Medrol 40 mg. Needle: 22 gauge   Orders Today:  No orders of the defined types were placed in this encounter.    Updated Medication List: (Includes new medications, updates to list, dose  adjustments) Meds ordered this encounter  Medications  . aspirin 81 MG tablet    Sig: Take 81 mg by mouth daily.    Medications Discontinued: Medications Discontinued During This Encounter  Medication Reason  . aspirin 325 MG tablet Entry Error  . Insulin Syringe-Needle U-100 (INS SYR UL THIN SHORT 1CC/30G) 30G X 1/61" 1 ML MISC Duplicate      Signed, Emmilia Sowder T. Khambrel Amsden, MD 08/19/2013 8:13 AM

## 2013-08-30 ENCOUNTER — Other Ambulatory Visit: Payer: Self-pay | Admitting: *Deleted

## 2013-08-30 MED ORDER — ACCU-CHEK FASTCLIX LANCETS MISC: 1.0000 | Freq: Three times a day (TID) | Status: AC

## 2013-08-30 MED ORDER — ACCU-CHEK NANO SMARTVIEW W/DEVICE KIT: 1.0000 | PACK | Freq: Three times a day (TID) | Status: AC

## 2013-08-30 MED ORDER — GLUCOSE BLOOD VI STRP: ORAL_STRIP | Status: DC

## 2013-08-30 NOTE — Telephone Encounter (Signed)
Faxed request.  Hard copy in your In Box if needed.  I don't know how many times a day he checks his BS.  Tried to phone patient, no answer at home or cell #.  Rx will need to include frequency of testing, diagnosis and whether or not he is insulin dependent.

## 2013-08-30 NOTE — Telephone Encounter (Signed)
Dx 250.02 Checks CBGs 2-3 times daily due to fluctuating blood sugars. He is on insulin.  Please complete prescription.

## 2013-09-02 ENCOUNTER — Other Ambulatory Visit (INDEPENDENT_AMBULATORY_CARE_PROVIDER_SITE_OTHER): Payer: Medicare PPO

## 2013-09-02 DIAGNOSIS — E119 Type 2 diabetes mellitus without complications: Secondary | ICD-10-CM

## 2013-09-02 DIAGNOSIS — E7800 Pure hypercholesterolemia, unspecified: Secondary | ICD-10-CM

## 2013-09-02 DIAGNOSIS — I10 Essential (primary) hypertension: Secondary | ICD-10-CM

## 2013-09-02 LAB — COMPREHENSIVE METABOLIC PANEL
ALT: 33 U/L (ref 0–53)
AST: 22 U/L (ref 0–37)
Albumin: 4.2 g/dL (ref 3.5–5.2)
Alkaline Phosphatase: 39 U/L (ref 39–117)
CO2: 26 mEq/L (ref 19–32)
Calcium: 9.4 mg/dL (ref 8.4–10.5)
Chloride: 106 mEq/L (ref 96–112)
Potassium: 4.5 mEq/L (ref 3.5–5.1)
Sodium: 138 mEq/L (ref 135–145)
Total Protein: 7 g/dL (ref 6.0–8.3)

## 2013-09-02 LAB — LIPID PANEL
LDL Cholesterol: 76 mg/dL (ref 0–99)
Total CHOL/HDL Ratio: 4
Triglycerides: 171 mg/dL — ABNORMAL HIGH (ref 0.0–149.0)

## 2013-09-02 LAB — HEMOGLOBIN A1C: Hgb A1c MFr Bld: 8.5 % — ABNORMAL HIGH (ref 4.6–6.5)

## 2013-09-06 ENCOUNTER — Ambulatory Visit (INDEPENDENT_AMBULATORY_CARE_PROVIDER_SITE_OTHER): Payer: Medicare PPO | Admitting: Family Medicine

## 2013-09-06 ENCOUNTER — Encounter: Payer: Self-pay | Admitting: Family Medicine

## 2013-09-06 ENCOUNTER — Ambulatory Visit (INDEPENDENT_AMBULATORY_CARE_PROVIDER_SITE_OTHER)
Admission: RE | Admit: 2013-09-06 | Discharge: 2013-09-06 | Disposition: A | Discharge status: Home / Self Care | Payer: Medicare PPO | Source: Ambulatory Visit | Attending: Family Medicine | Admitting: Family Medicine

## 2013-09-06 VITALS — BP 120/78 | HR 76 | Temp 98.0°F | Ht 68.0 in | Wt 182.5 lb

## 2013-09-06 DIAGNOSIS — Z23 Encounter for immunization: Secondary | ICD-10-CM

## 2013-09-06 DIAGNOSIS — N182 Chronic kidney disease, stage 2 (mild): Secondary | ICD-10-CM

## 2013-09-06 DIAGNOSIS — E7800 Pure hypercholesterolemia, unspecified: Secondary | ICD-10-CM

## 2013-09-06 DIAGNOSIS — I10 Essential (primary) hypertension: Secondary | ICD-10-CM

## 2013-09-06 DIAGNOSIS — R0989 Other specified symptoms and signs involving the circulatory and respiratory systems: Secondary | ICD-10-CM

## 2013-09-06 DIAGNOSIS — IMO0001 Reserved for inherently not codable concepts without codable children: Secondary | ICD-10-CM

## 2013-09-06 MED ORDER — LISINOPRIL 2.5 MG PO TABS: 2.5000 mg | ORAL_TABLET | Freq: Every day | ORAL | Status: DC

## 2013-09-06 NOTE — Assessment & Plan Note (Signed)
Start lisinopril back for kidney protection.  Check microalbumin today.

## 2013-09-06 NOTE — Addendum Note (Signed)
Addended by: Ellamae Sia on: 09/06/2013 11:10 AM   Modules accepted: Orders

## 2013-09-06 NOTE — Assessment & Plan Note (Signed)
Persists...he is open to a CXR to eval today. Still is asymptomatic.

## 2013-09-06 NOTE — Assessment & Plan Note (Signed)
Improved control on current meds.

## 2013-09-06 NOTE — Assessment & Plan Note (Signed)
Well controlled. Continue current medication.  

## 2013-09-06 NOTE — Progress Notes (Signed)
72 year old male with CAD and poorly controlled DM presents for 3 month follow up. Did not help at all, maybe made it worse.  Right shoulder pain continue ( dx as rotator cuff tendonitis)... steriod injection. He has limited motion. Cannot raise it.  Using aleve 2 tabs daily...  Helps some. He will let me now if this does not improve.  Hypertension: Well controlled at goal on lisinopril.  Using medication without problems or lightheadedness: Was feeling dizzy With sitting to standing so stopped metoprolol 25 mg 1/2 tab BID. BP remains well controlled.  Chest pain with exertion:None  Edema:None  Short of breath:None  Average home BPs: 120/70  Other issues:   Elevated Cholesterol: LDL almost at goal ,70on zocor 40 mg daily. Trigs high and HDL low.  Lab Results  Component Value Date   CHOL 147 09/02/2013   HDL 36.50* 09/02/2013   LDLCALC 76 09/02/2013   LDLDIRECT 73.9 02/21/2013   TRIG 171.0* 09/02/2013   CHOLHDL 4 09/02/2013   Using medications without problems: None  Muscle aches: None  Diet compliance: Poor...eats a lot of pasta potatos, breads.  Exercise: 1-2 mile walking a day with dog  Other complaints: CAD   Diabetes: On Lantus Units 100 daily . Only metformin 1000 mg ONCE daily. Also taking glipizide 10 mg max dose.  A1c shows worsened control.  At last OV:  Pt noncompliant with meds. Not interested in increasing metformin back given diarrhea at higher dose. He states " if I eat right this issue would not be here" He wishes to try aggressive diet, but sinccde SE to insulin ( occ itching ) is mild he will titrate insulin back up.  Offered ENDo referral.. Refused.  Discussed adding byetta or vicotza, but he is not interested. He understands complications of DM possible including CAD, recurrent MI as well as renal failure.  Lab Results  Component Value Date   HGBA1C 8.5* 09/02/2013  Using medications without difficulties:  Hypoglycemic episodes: none  Hyperglycemic episodes:  yes Feet problems:None  Blood Sugars averaging: fbs 170-200 Checking 2 times a day,  Later in day 170 eye exam within last year: Yes    He is going to [place] for 6 months... Cannot come back until after that for follow up.  Review of Systems  Constitutional: Negative for fever and fatigue.  HENT: Negative for ear pain.  Eyes: Negative for pain.  Respiratory: Negative for choking, shortness of breath and wheezing.  Cardiovascular: Negative for chest pain, palpitations and leg swelling.  Gastrointestinal: Negative for abdominal pain.  Objective:   Physical Exam  Constitutional: Vital signs are normal. He appears well-developed and well-nourished.  HENT:  Head: Normocephalic.  Right Ear: Hearing normal.  Left Ear: Hearing normal.  Nose: Nose normal.  Mouth/Throat: Oropharynx is clear and moist and mucous membranes are normal.  Neck: Trachea normal. Carotid bruit is not present. No mass and no thyromegaly present.  Cardiovascular: Normal rate, regular rhythm and normal pulses. Exam reveals no gallop, no distant heart sounds and no friction rub.  No murmur heard. No peripheral edema  Pulmonary/Chest: Effort normal and breath sounds: rales at B bases. No respiratory distress.  Skin: Skin is warm, dry and intact. No rash noted.  Psychiatric: He has a normal mood and affect. His speech is normal and behavior is normal. Thought content normal.   Diabetic foot exam:  Normal inspection  No skin breakdown  No calluses  Normal DP pulses  Normal sensation to light touch and  monofilament  Nails normal

## 2013-09-06 NOTE — Patient Instructions (Addendum)
Increase and change  lantus 55 Units twice a day. Stop at lab on way out for urine sample. Start lisinopril for kidney protection.  We will call with X-ray results. Follow up in 3 months with fasting labs prior.

## 2013-09-06 NOTE — Assessment & Plan Note (Signed)
Poor control.  Pt agreed to increase lantus but he has previously and then not done it. Re-eval in 3 months.

## 2013-09-09 LAB — MICROALBUMIN / CREATININE URINE RATIO: Creatinine,U: 183.4 mg/dL

## 2013-09-10 ENCOUNTER — Encounter: Payer: Self-pay | Admitting: *Deleted

## 2013-10-14 ENCOUNTER — Telehealth: Payer: Self-pay | Admitting: *Deleted

## 2013-10-14 DIAGNOSIS — R0989 Other specified symptoms and signs involving the circulatory and respiratory systems: Secondary | ICD-10-CM

## 2013-10-14 NOTE — Telephone Encounter (Signed)
[name] (wife) called to say Mr. Brian Bass would be back in from [place] today and would like to go ahead and scheudule the appointment to follow up on his lungs.  [name] states she thinks it is suppose to be an ultrasound.  Per our notes, it look like Dr. Diona Browner wanted to order a CT. Will forward to Dr. Diona Browner to place the order.  [name] notified Rosaria Ferries would be calling them with that appointment.

## 2013-10-15 ENCOUNTER — Telehealth: Payer: Self-pay

## 2013-10-15 NOTE — Telephone Encounter (Signed)
Mrs Kucinski left v/m; pt was seen 09/06/13 and lisinopril was supposed to be started; Mrs Lefeber thought this med is for BP and pts BP is always OK; pt has not started lisinopril. Mrs Smeltzer wants to know if pt needs to take med. 09/06/13 note advised giving lisinopril for kidney protection.Please advise.

## 2013-10-17 NOTE — Telephone Encounter (Signed)
Brian Bass notified as instructed by telephone.  They have the medication.  She will have [name] to start taking it.

## 2013-10-17 NOTE — Telephone Encounter (Signed)
Lisinopril is a BP med... But we were restarting it for kidney protection in this diabetic as he has evidence of renal insufficieny ie decline in kidney function. (it is such a low dose it should not really effect the BP)  Please refill it if he does not have any at home like I thought.

## 2013-10-21 ENCOUNTER — Ambulatory Visit (INDEPENDENT_AMBULATORY_CARE_PROVIDER_SITE_OTHER)
Admission: RE | Admit: 2013-10-21 | Discharge: 2013-10-21 | Disposition: A | Discharge status: Home / Self Care | Payer: Medicare PPO | Source: Ambulatory Visit | Attending: Family Medicine | Admitting: Family Medicine

## 2013-10-21 DIAGNOSIS — R0989 Other specified symptoms and signs involving the circulatory and respiratory systems: Secondary | ICD-10-CM

## 2013-10-21 MED ORDER — IOHEXOL 300 MG/ML  SOLN
80.0000 mL | Freq: Once | INTRAMUSCULAR | Status: AC | PRN
  Administered 2013-10-21: 80 mL via INTRAVENOUS

## 2013-10-24 ENCOUNTER — Telehealth: Payer: Self-pay | Admitting: Family Medicine

## 2013-10-24 DIAGNOSIS — R918 Other nonspecific abnormal finding of lung field: Secondary | ICD-10-CM | POA: Insufficient documentation

## 2013-10-24 DIAGNOSIS — R0989 Other specified symptoms and signs involving the circulatory and respiratory systems: Secondary | ICD-10-CM

## 2013-10-24 NOTE — Telephone Encounter (Signed)
Referral sent.

## 2013-11-07 ENCOUNTER — Other Ambulatory Visit: Payer: Medicare PPO

## 2013-11-07 ENCOUNTER — Ambulatory Visit (INDEPENDENT_AMBULATORY_CARE_PROVIDER_SITE_OTHER): Payer: Medicare PPO | Admitting: Pulmonary Disease

## 2013-11-07 ENCOUNTER — Encounter: Payer: Self-pay | Admitting: Pulmonary Disease

## 2013-11-07 VITALS — BP 132/82 | HR 92 | Ht 68.0 in | Wt 182.0 lb

## 2013-11-07 DIAGNOSIS — K222 Esophageal obstruction: Secondary | ICD-10-CM

## 2013-11-07 DIAGNOSIS — J849 Interstitial pulmonary disease, unspecified: Secondary | ICD-10-CM

## 2013-11-07 DIAGNOSIS — J8410 Pulmonary fibrosis, unspecified: Secondary | ICD-10-CM

## 2013-11-07 NOTE — Progress Notes (Signed)
Subjective:    Patient ID: Brian Bass, male    DOB: September 11, 1941, 72 y.o.   MRN: [id number]  HPI  He was referred to [name] from Dr. Diona Browner because she had neard abnormal sounds with lung exam.  After the last visit she ordered a CXR which was abnormal so a CT scan was ordered. This was abnormal so he was sent her.  He has never had any breathing trouble or any lung problems.  He walks 2 miles a day and he never gets short of breath.  He once had dyspnea prior to bypass surgery prior two or three years ago.    He does not have a regular cough unless he has a cold.    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  He has acid reflux which is worse when he overeats. He has had his esophagus stretched in the past for swallowing difficulty in the past.  His symptoms are starting come back now and he has been trying to control it more.  He feels like food is going down the wrong pipe.  He has had bad heartburn symptoms and regurgitation in the past.  He knows that sometimes actually regurgitates liquid into his lungs.   Past Medical History  Diagnosis Date  . Diabetes mellitus   . IBS (irritable bowel syndrome)   . Hyperlipidemia   . GERD (gastroesophageal reflux disease)   . Esophageal stricture   . Adenomatous colon polyp   . History of colon polyps 922/2011     Family History  Problem Relation Age of Onset  . Heart failure Mother   . Aneurysm Father   . Alcohol abuse Brother      History   Social History  . Marital Status: Married    Spouse Name: N/A    Number of Children: N/A  . Years of Education: N/A   Occupational History  . 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 Smoking status: Never Smoker   . Smokeless tobacco: Never Used  . Alcohol Use: No  . Drug Use: No  . Sexual Activity: Not on file   Other  Topics Concern  . Not on file   Social History Narrative   Patient does not get regular exercise, but manual labor with farm work   Diet: fruits and veggies, fish, chicken, water    HCPOA: wife, full code, no living will (reviewed 2013)     No Known Allergies   Outpatient Prescriptions Prior to Visit  Medication Sig Dispense Refill  . ACCU-CHEK FASTCLIX LANCETS MISC 1 Device by Other route 3 (three) times daily. Check blood sugars 2 to 3 times daily.  Dx: 250.02  102 each  5  . aspirin 81 MG tablet Take 81 mg by mouth daily.      . Blood Glucose Monitoring Suppl (ACCU-CHEK NANO SMARTVIEW) W/DEVICE KIT 1 kit by Other route 3 (three) times daily. Use to check blood sugars 2 to 3 times daily.  Dx: 250.02  1 kit  0  . glipiZIDE (GLUCOTROL) 10 MG tablet Take 1 tablet (10 mg total) by mouth 2 (two) times daily before a meal.  60 tablet  11  . Glucosamine & Fish Oil 500-400-60-40 MG CAPS Take 1 capsule by mouth daily.        [name]  glucose blood (ACCU-CHEK SMARTVIEW) test strip Use to check blood sugars 2 to 3 times daily.  Dx: 250.02  100 each  12  . insulin glargine (LANTUS) 100 UNIT/ML injection Inject 100 Units into the skin daily.  60 vial  3  . Insulin Pen Needle 29G X 12.7MM MISC by Does not apply route.        . Insulin Syringe-Needle U-100 (B-D INS SYR ULTRAFINE 1CC/31G) 31G X 5/16" 1 ML MISC Use as directed  100 each  3  . lisinopril (PRINIVIL,ZESTRIL) 2.5 MG tablet Take 1 tablet (2.5 mg total) by mouth daily.  30 tablet  11  . metFORMIN (GLUCOPHAGE) 1000 MG tablet Take 1 tablet (1,000 mg total) by mouth 2 (two) times daily with a meal.  60 tablet  11  . Multiple Vitamin (MULTIVITAMIN) tablet Take 1 tablet by mouth daily.        [name] omeprazole (PRILOSEC) 40 MG capsule Take one by mouth daily  90 capsule  3  . simvastatin (ZOCOR) 40 MG tablet TAKE 1 TABLET (40 MG TOTAL) AT BEDTIME  90 tablet  PRN   No facility-administered medications prior to visit.      Review of Systems   Constitutional: Negative for fever and unexpected weight change.  HENT: Positive for postnasal drip. Negative for congestion, dental problem, ear pain, nosebleeds, rhinorrhea, sinus pressure, sneezing, sore throat and trouble swallowing.   Eyes: Positive for itching. Negative for redness.  Respiratory: Negative for cough, chest tightness, shortness of breath and wheezing.   Cardiovascular: Negative for palpitations and leg swelling.  Gastrointestinal: Negative for nausea and vomiting.  Genitourinary: Negative for dysuria.  Musculoskeletal: Positive for arthralgias. Negative for joint swelling.  Skin: Negative for rash.  Neurological: Negative for headaches.  Hematological: Does not bruise/bleed easily.  Psychiatric/Behavioral: Negative for dysphoric mood. The patient is not nervous/anxious.        Objective:   Physical Exam Filed Vitals:   11/07/13 1449  BP: 132/82  Pulse: 92  Height: _0  (1.727 m)  Weight: 182 lb (82.555 kg)  SpO2: 98%   Gen: well appearing, no acute distress HEENT: NCAT, PERRL, EOMi, OP clear, neck supple without masses PULM: Crackles bilaterally, no wheezes, good air movement CV: RRR, no mgr, no JVD AB: BS+, soft, nontender, no hsm Ext: warm, no edema, no clubbing, no cyanosis Derm: no rash or skin breakdown Neuro: A&Ox4, CN II-XII intact, strength 5/5 in all 4 extremities   10/2013 CT thorax > [facility] read > there is heterogeneous ground glass opacification in a peripheral distribution a bit more concentrated in the bases of both lungs, there is mild interstitial and interlobular thickening again in a peripheral distribution there are areas of groundglass opacification scattered throughout both lungs; there is borderline lymphadenopathy; there is some pleural thickening bilaterally.     Assessment & Plan:   Diffuse Parynchymal Lung Disease Mr. Minner has diffuse parenchymal lung disease of undefined etiology at this point. The distribution of the  abnormality on his CT scan is peripheral and perhaps slightly more predominant in the bases. However beyond that there are no distinguishing features which which strongly suggest usual interstitial pneumonitis. Considering his clinical history I am most concerned about chronic aspiration related to his esophageal strictures. The differential diagnosis is broad but considering his age group it would be very reasonable to consider an open lung biopsy.  Today we discussed this at length and reviewed the images. I strongly suggested that he consider an open lung biopsy.  He prefers to defer this at this time. We agreed upon a plan to repeat a 6 minute walk and full pulmonary function testing as well as imaging with high-resolution cuts in 6 months time.  Plan summary: - Barium swallow to evaluate for esophageal stricture -Interstitial lung disease serology panel -Full pulmonary function test -6 minute walk -Repeat 6 minute walk, full pulmonary function test and high-risk CT in 6 months time -If there is clear decrease in function or change in his CT that he needs an open lung biopsy to rule out usual interstitial pneumonitis  ESOPHAGEAL STRICTURE He has ongoing symptoms of dysphasia which she feels are worse since his last balloon dilatation. He was not interested in seeing gastroenterology despite my recommendation today.  Plan: -Barium swallow -If worsening symptoms then refer back to GI for repeat endoscopy    Updated Medication List Outpatient Encounter Prescriptions as of 11/07/2013  Medication Sig  . ACCU-CHEK FASTCLIX LANCETS MISC 1 Device by Other route 3 (three) times daily. Check blood sugars 2 to 3 times daily.  Dx: 250.02  . aspirin 81 MG tablet Take 81 mg by mouth daily.  . Blood Glucose Monitoring Suppl (ACCU-CHEK NANO SMARTVIEW) W/DEVICE KIT 1 kit by Other route 3 (three) times daily. Use to check blood sugars 2 to 3 times daily.  Dx: 250.02  . glipiZIDE (GLUCOTROL) 10 MG tablet  Take 1 tablet (10 mg total) by mouth 2 (two) times daily before a meal.  . Glucosamine & Fish Oil 500-400-60-40 MG CAPS Take 1 capsule by mouth daily.    [name] glucose blood (ACCU-CHEK SMARTVIEW) test strip Use to check blood sugars 2 to 3 times daily.  Dx: 250.02  . insulin glargine (LANTUS) 100 UNIT/ML injection Inject 100 Units into the skin daily.  . Insulin Pen Needle 29G X 12.7MM MISC by Does not apply route.    . Insulin Syringe-Needle U-100 (B-D INS SYR ULTRAFINE 1CC/31G) 31G X 5/16" 1 ML MISC Use as directed  . lisinopril (PRINIVIL,ZESTRIL) 2.5 MG tablet Take 1 tablet (2.5 mg total) by mouth daily.  . metFORMIN (GLUCOPHAGE) 1000 MG tablet Take 1 tablet (1,000 mg total) by mouth 2 (two) times daily with a meal.  . Multiple Vitamin (MULTIVITAMIN) tablet Take 1 tablet by mouth daily.    [name] omeprazole (PRILOSEC) 40 MG capsule Take one by mouth daily  . simvastatin (ZOCOR) 40 MG tablet TAKE 1 TABLET (40 MG TOTAL) AT BEDTIME

## 2013-11-07 NOTE — Patient Instructions (Signed)
We will set up a pulmonary function test and a 6 minute walk for you We will order a barium swallow to assess your swallowing We will repeat a CT scan, lung function testing and the 6 minute walk in 6 months and see you after that Let us know if you develop shortness of breath between now and then

## 2013-11-08 DIAGNOSIS — J849 Interstitial pulmonary disease, unspecified: Secondary | ICD-10-CM | POA: Insufficient documentation

## 2013-11-08 LAB — SJOGRENS SYNDROME-A EXTRACTABLE NUCLEAR ANTIBODY: SSA (Ro) (ENA) Antibody, IgG: 15 AU/mL (ref <30)

## 2013-11-08 LAB — CENTROMERE ANTIBODIES: Centromere Ab Screen: 7 AU/mL (ref <30)

## 2013-11-08 NOTE — Assessment & Plan Note (Signed)
He has ongoing symptoms of dysphasia which she feels are worse since his last balloon dilatation. He was not interested in seeing gastroenterology despite my recommendation today.  Plan: -Barium swallow -If worsening symptoms then refer back to GI for repeat endoscopy

## 2013-11-08 NOTE — Assessment & Plan Note (Addendum)
Brian Bass has diffuse parenchymal lung disease of undefined etiology at this point. The distribution of the abnormality on his CT scan is peripheral and perhaps slightly more predominant in the bases. However beyond that there are no distinguishing features which which strongly suggest usual interstitial pneumonitis. Considering his clinical history I am most concerned about chronic aspiration related to his esophageal strictures. The differential diagnosis is broad but considering his age group it would be very reasonable to consider an open lung biopsy.  Today we discussed this at length and reviewed the images. I strongly suggested that he consider an open lung biopsy. He prefers to defer this at this time. We agreed upon a plan to repeat a 6 minute walk and full pulmonary function testing as well as imaging with high-resolution cuts in 6 months time.  Plan summary: - Barium swallow to evaluate for esophageal stricture -Interstitial lung disease serology panel -Full pulmonary function test -6 minute walk -Repeat 6 minute walk, full pulmonary function test and high-risk CT in 6 months time -If there is clear decrease in function or change in his CT that he needs an open lung biopsy to rule out usual interstitial pneumonitis

## 2013-11-11 ENCOUNTER — Ambulatory Visit (HOSPITAL_COMMUNITY)
Admission: RE | Admit: 2013-11-11 | Discharge: 2013-11-11 | Disposition: A | Discharge status: Home / Self Care | Payer: Medicare PPO | Source: Ambulatory Visit | Attending: Pulmonary Disease | Admitting: Pulmonary Disease

## 2013-11-11 DIAGNOSIS — K224 Dyskinesia of esophagus: Secondary | ICD-10-CM | POA: Insufficient documentation

## 2013-11-11 DIAGNOSIS — R1310 Dysphagia, unspecified: Secondary | ICD-10-CM | POA: Insufficient documentation

## 2013-11-11 DIAGNOSIS — J849 Interstitial pulmonary disease, unspecified: Secondary | ICD-10-CM

## 2013-11-12 ENCOUNTER — Telehealth: Payer: Self-pay

## 2013-11-12 DIAGNOSIS — K222 Esophageal obstruction: Secondary | ICD-10-CM

## 2013-11-12 NOTE — Telephone Encounter (Signed)
LMTCB X1

## 2013-11-12 NOTE — Telephone Encounter (Signed)
Message copied by Len Blalock on Tue Nov 12, 2013  1:38 PM ------      Message from: Simonne Maffucci B      Created: Tue Nov 12, 2013 10:16 AM       A,            Please let him know that his esophogram showed that he has a small narrowing in his esophagus and his esophagus does not move normally.  He and I talked about this during last week's visit some.  I think he should see GI medicine about this.            Thanks,            B ------

## 2013-11-13 NOTE — Telephone Encounter (Signed)
I spoke with patient about results and he verbalized understanding and had no questions. Referral placed

## 2013-11-13 NOTE — Telephone Encounter (Signed)
Pt's spouse is returning Ashley's call & can be reached at [phone].  Brian Bass

## 2013-12-09 ENCOUNTER — Other Ambulatory Visit (INDEPENDENT_AMBULATORY_CARE_PROVIDER_SITE_OTHER): Payer: Medicare PPO

## 2013-12-09 ENCOUNTER — Telehealth: Payer: Self-pay | Admitting: Family Medicine

## 2013-12-09 DIAGNOSIS — E1165 Type 2 diabetes mellitus with hyperglycemia: Principal | ICD-10-CM

## 2013-12-09 DIAGNOSIS — IMO0001 Reserved for inherently not codable concepts without codable children: Secondary | ICD-10-CM

## 2013-12-09 DIAGNOSIS — M109 Gout, unspecified: Secondary | ICD-10-CM

## 2013-12-09 LAB — COMPREHENSIVE METABOLIC PANEL
ALK PHOS: 42 U/L (ref 39–117)
ALT: 35 U/L (ref 0–53)
AST: 33 U/L (ref 0–37)
Albumin: 4.6 g/dL (ref 3.5–5.2)
BILIRUBIN TOTAL: 0.9 mg/dL (ref 0.3–1.2)
BUN: 15 mg/dL (ref 6–23)
CO2: 26 mEq/L (ref 19–32)
Calcium: 9.5 mg/dL (ref 8.4–10.5)
Chloride: 102 mEq/L (ref 96–112)
Creatinine, Ser: 1.3 mg/dL (ref 0.4–1.5)
GFR: 55.95 mL/min — ABNORMAL LOW (ref >60.00)
GLUCOSE: 212 mg/dL — AB (ref 70–99)
Potassium: 4.8 mEq/L (ref 3.5–5.1)
Sodium: 136 mEq/L (ref 135–145)
Total Protein: 7.3 g/dL (ref 6.0–8.3)

## 2013-12-09 LAB — HEMOGLOBIN A1C: HEMOGLOBIN A1C: 8 % — AB (ref 4.6–6.5)

## 2013-12-09 LAB — LIPID PANEL
CHOLESTEROL: 145 mg/dL (ref 0–200)
HDL: 32.2 mg/dL — AB (ref >39.00)
Total CHOL/HDL Ratio: 5
Triglycerides: 269 mg/dL — ABNORMAL HIGH (ref 0.0–149.0)
VLDL: 53.8 mg/dL — AB (ref 0.0–40.0)

## 2013-12-09 LAB — URIC ACID: Uric Acid, Serum: 8.3 mg/dL — ABNORMAL HIGH (ref 4.0–7.8)

## 2013-12-09 NOTE — Telephone Encounter (Signed)
Message copied by Jinny Sanders on Mon Dec 09, 2013 12:19 PM ------      Message from: Ellamae Sia      Created: Fri Nov 29, 2013 11:00 AM      Regarding: Lab orders for Monday, 1.5.15       Lab orders for 3 month f/u ------

## 2013-12-10 LAB — LDL CHOLESTEROL, DIRECT: Direct LDL: 73 mg/dL

## 2013-12-13 ENCOUNTER — Encounter: Payer: Self-pay | Admitting: Family Medicine

## 2013-12-13 ENCOUNTER — Ambulatory Visit (INDEPENDENT_AMBULATORY_CARE_PROVIDER_SITE_OTHER): Payer: Medicare PPO | Admitting: Family Medicine

## 2013-12-13 VITALS — BP 134/70 | HR 82 | Temp 97.6°F | Ht 68.0 in | Wt 182.2 lb

## 2013-12-13 DIAGNOSIS — Z87898 Personal history of other specified conditions: Secondary | ICD-10-CM

## 2013-12-13 DIAGNOSIS — K219 Gastro-esophageal reflux disease without esophagitis: Secondary | ICD-10-CM

## 2013-12-13 DIAGNOSIS — I10 Essential (primary) hypertension: Secondary | ICD-10-CM

## 2013-12-13 DIAGNOSIS — IMO0001 Reserved for inherently not codable concepts without codable children: Secondary | ICD-10-CM

## 2013-12-13 DIAGNOSIS — E1165 Type 2 diabetes mellitus with hyperglycemia: Principal | ICD-10-CM

## 2013-12-13 DIAGNOSIS — E7800 Pure hypercholesterolemia, unspecified: Secondary | ICD-10-CM

## 2013-12-13 MED ORDER — TAMSULOSIN HCL 0.4 MG PO CAPS: 0.4000 mg | ORAL_CAPSULE | Freq: Every day | ORAL | Status: DC

## 2013-12-13 NOTE — Progress Notes (Signed)
Pre-visit discussion using our clinic review tool. No additional management support is needed unless otherwise documented below in the visit note.

## 2013-12-13 NOTE — Assessment & Plan Note (Signed)
Well controlled. Continue current medication.

## 2013-12-13 NOTE — Patient Instructions (Addendum)
Continue Lantus 55 Units twice daily. Continue with diet changes for reflux.  Cut out bedtime sweets some. Decrease beans. Have larger meal earlier in day.  Increase prilosec to 2 x 20 mg daily. Start back on flomax for urine flow. Follow up in 3 months for DM re-eval.

## 2013-12-13 NOTE — Progress Notes (Signed)
73 year old male presents for 3 month follow up DM.  Hypertension: Well controlled at goal on lisinopril.  BP Readings from Last 3 Encounters:  12/13/13 134/70  11/07/13 132/82  09/06/13 120/78  Using medication without problems or lightheadedness: Was feeling dizzy With sitting to standing so stopped metoprolol 25 mg 1/2 tab BID. BP remains well controlled.  Chest pain with exertion:None  Edema:None  Short of breath:None  Average home BPs: 120/70  Other issues:   Elevated Cholesterol: LDL at goal < 70 on zocor 40 mg daily. Trigs high and HDL low.  Lab Results  Component Value Date   CHOL 145 12/09/2013   HDL 32.20* 12/09/2013   LDLCALC 76 09/02/2013   LDLDIRECT 73.0 12/09/2013   TRIG 269.0* 12/09/2013   CHOLHDL 5 12/09/2013   Using medications without problems: None  Muscle aches: None  Diet compliance: Poor...eats a lot of pasta potatos, breads.  Exercise: 1-2 mile walking a day with dog  Other complaints: CAD   Diabetes: On Lantus Units 55 units twice daily . Only metformin 1000 mg ONCE daily. Also taking glipizide 10 mg max dose.  A1c shows slightly imporoved control but far from goal. At last OV:  Pt noncompliant with meds. Not interested in increasing metformin back given diarrhea at higher dose. He states " if I eat right this issue would not be here" He wishes to try aggressive diet, but sinccde SE to insulin ( occ itching ) is mild he will titrate insulin back up.  Offered ENDo referral.. Refused.  Discussed adding byetta or vicotza, but he is not interested. He understands complications of DM possible including CAD, recurrent MI as well as renal failure.  Lab Results  Component Value Date   HGBA1C 8.0* 12/09/2013  Using medications without difficulties:  Hypoglycemic episodes: rare Hyperglycemic episodes: yes Feet problems:None  Blood Sugars averaging: fbs 200 Checking 2 times a day, Later in day 70  Eating sweets later in day. He has decreased caffeine and soda.. Basically  drinking water.  has had improved reflux. eye exam within last year: Yes   He has seen Dr. Lake Bells for interstitial lung disease felt like could be due to chronic reflux. Recommended swallowing test and 6 min walk test. 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 No  stricture or mass. No esophagitis.   GEN: nad, alert and oriented HEENT: mucous membranes moist NECK: supple w/o LA CV: rrr.  no murmur PULM: ctab, no inc wob ABD: soft, +bs EXT: no edema SKIN: no acute rash Diabetic foot exam: Normal inspection No skin breakdown No calluses  Normal DP pulses Normal sensation to light touch and monofilament Nails normal

## 2013-12-13 NOTE — Assessment & Plan Note (Signed)
Well controlled. Continue current medication.  

## 2013-12-13 NOTE — Assessment & Plan Note (Signed)
Improved ocntrol on higher insulin bit not at goal.  Given low CBGs later in day and high in AM.... Stop large meal later in the day, sweets.

## 2013-12-13 NOTE — Assessment & Plan Note (Signed)
Inadequate control, no dysuria just decreased flow.  Restart flomax daily.

## 2013-12-13 NOTE — Assessment & Plan Note (Signed)
Continue diet changes, go back on 40 mg omeprazole instead on 20 mg daily.

## 2013-12-17 ENCOUNTER — Telehealth: Payer: Self-pay

## 2013-12-17 NOTE — Telephone Encounter (Signed)
Relevant patient education assigned to patient using Emmi.

## 2013-12-25 ENCOUNTER — Encounter: Payer: Self-pay | Admitting: Family Medicine

## 2014-02-03 ENCOUNTER — Other Ambulatory Visit: Payer: Self-pay | Admitting: *Deleted

## 2014-02-03 MED ORDER — INSULIN PEN NEEDLE 31G X 8 MM MISC: Status: DC

## 2014-02-03 NOTE — Telephone Encounter (Signed)
Received a faxed request from pharmacy for a 90 day supply for BD insulin pen needle short 31G 5/16 (46m) needle. Does not match the medication list, added.  Please advise how often patient would need to [place]?

## 2014-02-10 ENCOUNTER — Other Ambulatory Visit: Payer: Self-pay | Admitting: *Deleted

## 2014-02-10 MED ORDER — "INSULIN SYRINGE-NEEDLE U-100 31G X 5/16"" 1 ML MISC": Status: DC

## 2014-02-13 NOTE — Telephone Encounter (Signed)
Brian Bass called to ck on status of syringe refill; advised done and pt will ck with pharmacy.

## 2014-02-24 ENCOUNTER — Other Ambulatory Visit: Payer: Self-pay

## 2014-02-24 MED ORDER — INSULIN GLARGINE 100 UNIT/ML ~~LOC~~ SOLN: 55.0000 [IU] | Freq: Two times a day (BID) | SUBCUTANEOUS | Status: DC

## 2014-02-24 MED ORDER — SIMVASTATIN 40 MG PO TABS: ORAL_TABLET | ORAL | Status: DC

## 2014-02-24 NOTE — Telephone Encounter (Signed)
Pt request refill on simvastatin (refill done) and lantus to [facility]; med list has Lantus 100 units into the skin daily; Mrs Alper said pt takes Lantus 55 units twice a day; that was dosage advised on 12/13/13 office visit; pt does not have DM f/u scheduled in April.Please advise. Mrs Orihuela request cb.

## 2014-02-24 NOTE — Telephone Encounter (Signed)
Notify wife rx for lantus  sent in

## 2014-02-25 NOTE — Telephone Encounter (Signed)
Brian Bass (wife) notified Lantus Rx for 55 units two times a day has been sent in to [place].

## 2014-03-27 ENCOUNTER — Other Ambulatory Visit: Payer: Self-pay | Admitting: Family Medicine

## 2014-03-27 NOTE — Telephone Encounter (Signed)
Pts wife request refill glipizide to [facility]; pt already has scheduled CPX on 06/20/14. Advised refills done.

## 2014-04-15 ENCOUNTER — Other Ambulatory Visit: Payer: Medicare PPO

## 2014-05-21 LAB — HM DIABETES EYE EXAM

## 2014-05-30 ENCOUNTER — Encounter: Payer: Self-pay | Admitting: Family Medicine

## 2014-06-03 ENCOUNTER — Other Ambulatory Visit: Payer: Self-pay | Admitting: Family Medicine

## 2014-06-09 ENCOUNTER — Other Ambulatory Visit: Payer: Self-pay | Admitting: *Deleted

## 2014-06-09 MED ORDER — OMEPRAZOLE 40 MG PO CPDR: DELAYED_RELEASE_CAPSULE | ORAL | Status: DC

## 2014-06-17 ENCOUNTER — Telehealth: Payer: Self-pay | Admitting: Family Medicine

## 2014-06-17 ENCOUNTER — Other Ambulatory Visit (INDEPENDENT_AMBULATORY_CARE_PROVIDER_SITE_OTHER): Payer: Medicare PPO

## 2014-06-17 DIAGNOSIS — I10 Essential (primary) hypertension: Secondary | ICD-10-CM

## 2014-06-17 DIAGNOSIS — E7800 Pure hypercholesterolemia, unspecified: Secondary | ICD-10-CM

## 2014-06-17 DIAGNOSIS — E1165 Type 2 diabetes mellitus with hyperglycemia: Secondary | ICD-10-CM

## 2014-06-17 DIAGNOSIS — IMO0001 Reserved for inherently not codable concepts without codable children: Secondary | ICD-10-CM

## 2014-06-17 DIAGNOSIS — M109 Gout, unspecified: Secondary | ICD-10-CM

## 2014-06-17 LAB — HEMOGLOBIN A1C: Hgb A1c MFr Bld: 7.9 % — ABNORMAL HIGH (ref 4.6–6.5)

## 2014-06-17 LAB — COMPREHENSIVE METABOLIC PANEL
ALBUMIN: 4.2 g/dL (ref 3.5–5.2)
ALT: 26 U/L (ref 0–53)
AST: 25 U/L (ref 0–37)
Alkaline Phosphatase: 50 U/L (ref 39–117)
BUN: 16 mg/dL (ref 6–23)
CHLORIDE: 104 meq/L (ref 96–112)
CO2: 26 meq/L (ref 19–32)
CREATININE: 1.2 mg/dL (ref 0.4–1.5)
Calcium: 9.3 mg/dL (ref 8.4–10.5)
GFR: 61.14 mL/min (ref >60.00)
Glucose, Bld: 204 mg/dL — ABNORMAL HIGH (ref 70–99)
POTASSIUM: 4.7 meq/L (ref 3.5–5.1)
Sodium: 136 mEq/L (ref 135–145)
Total Bilirubin: 0.7 mg/dL (ref 0.2–1.2)
Total Protein: 7.1 g/dL (ref 6.0–8.3)

## 2014-06-17 LAB — LIPID PANEL
CHOL/HDL RATIO: 4
Cholesterol: 126 mg/dL (ref 0–200)
HDL: 35.1 mg/dL — ABNORMAL LOW (ref >39.00)
LDL Cholesterol: 51 mg/dL (ref 0–99)
NonHDL: 90.9
TRIGLYCERIDES: 202 mg/dL — AB (ref 0.0–149.0)
VLDL: 40.4 mg/dL — ABNORMAL HIGH (ref 0.0–40.0)

## 2014-06-17 LAB — URIC ACID: URIC ACID, SERUM: 8.2 mg/dL — AB (ref 4.0–7.8)

## 2014-06-17 NOTE — Telephone Encounter (Signed)
Message copied by Jinny Sanders on Tue Jun 17, 2014  8:43 AM ------      Message from: Ellamae Sia      Created: Mon Jun 16, 2014 11:43 AM      Regarding: Lab orders for Tuesday, 7.14.15       Patient is scheduled for CPX labs, please order future labs, Thanks , Brian Bass            This patient is due for Diabetic Bundle for A1C,  ------

## 2014-06-18 ENCOUNTER — Telehealth: Payer: Self-pay | Admitting: Family Medicine

## 2014-06-18 NOTE — Telephone Encounter (Signed)
Relevant patient education assigned to patient using Emmi. ° °

## 2014-06-20 ENCOUNTER — Encounter: Payer: Self-pay | Admitting: Family Medicine

## 2014-06-20 ENCOUNTER — Ambulatory Visit (INDEPENDENT_AMBULATORY_CARE_PROVIDER_SITE_OTHER): Payer: Medicare PPO | Admitting: Family Medicine

## 2014-06-20 VITALS — BP 130/70 | HR 75 | Temp 97.8°F | Ht 66.65 in | Wt 167.0 lb

## 2014-06-20 DIAGNOSIS — I10 Essential (primary) hypertension: Secondary | ICD-10-CM

## 2014-06-20 DIAGNOSIS — E7800 Pure hypercholesterolemia, unspecified: Secondary | ICD-10-CM

## 2014-06-20 DIAGNOSIS — IMO0001 Reserved for inherently not codable concepts without codable children: Secondary | ICD-10-CM

## 2014-06-20 DIAGNOSIS — E1165 Type 2 diabetes mellitus with hyperglycemia: Secondary | ICD-10-CM

## 2014-06-20 DIAGNOSIS — Z0000 Encounter for general adult medical examination without abnormal findings: Secondary | ICD-10-CM

## 2014-06-20 LAB — HM DIABETES FOOT EXAM

## 2014-06-20 MED ORDER — OMEPRAZOLE 20 MG PO CPDR: 20.0000 mg | DELAYED_RELEASE_CAPSULE | Freq: Every day | ORAL | Status: DC

## 2014-06-20 NOTE — Progress Notes (Signed)
I have personally reviewed the Medicare Annual Wellness questionnaire and have noted  1. The patient's medical and social history  2. Their use of alcohol, tobacco or illicit drugs  3. Their current medications and supplements  4. The patient's functional ability including ADL's, fall risks, home safety risks and hearing or visual  impairment.  5. Diet and physical activities  6. Evidence for depression or mood disorders  The patients weight, height, BMI and visual acuity have been recorded in the chart  I have made referrals, counseling and provided education to the patient based review of the above and I have provided the pt with a written personalized care plan for preventive services.   Hypertension: Well controlled at goal off lisinopril  BP Readings from Last 3 Encounters:  06/20/14 130/70  12/13/13 134/70  11/07/13 132/82  Using medication without problems or lightheadedness: None Chest pain with exertion:None  Edema:None  Short of breath:None  Average home BPs: 120/70  Other issues:   Elevated Cholesterol:  LDL at goal , trig s high on simvastatin 40 mg daily Lab Results  Component Value Date   CHOL 126 06/17/2014   HDL 35.10* 06/17/2014   LDLCALC 51 06/17/2014   LDLDIRECT 73.0 12/09/2013   TRIG 202.0* 06/17/2014   CHOLHDL 4 06/17/2014  Using medications without problems:  None Muscle aches: None Diet compliance:  Moderate Exercise:  Walking 1 mile a day Other complaints: CAD   Diabetes:  Improving control on  Current dose on insulin 90 units daily Lab Results  Component Value Date   HGBA1C 7.9* 06/17/2014  Using medications without difficulties:  Hypoglycemic episodes: none  Hyperglycemic episodes: rarely Feet problems:None  Blood Sugars averaging: See above.. Checking 2 times a day,  FBS 130, 2 hours pp: 170 eye exam within last year: Yes   Has lost 15 lbs , increased exercise. Wt Readings from Last 3 Encounters:  06/20/14 167 lb (75.751 kg)  12/13/13 182 lb 4 oz  (82.668 kg)  11/07/13 182 lb (82.555 kg)   BPH: off tamsulosin    Review of Systems  Constitutional: Negative for fever, fatigue and unexpected weight change.  HENT: Negative for ear pain, congestion, sore throat, rhinorrhea, trouble swallowing and postnasal drip.  Eyes: Negative for pain.  Respiratory: Negative for cough, shortness of breath and wheezing.  Cardiovascular: Negative for chest pain, palpitations and leg swelling.  Gastrointestinal: Negative for nausea, abdominal pain, diarrhea, constipation and blood in stool.  Genitourinary: Positive for urgency, frequency and difficulty urinating. Negative for dysuria, hematuria, discharge, penile swelling, scrotal swelling, penile pain and testicular pain.  Skin: Negative for rash.  Neurological: Negative for syncope, weakness, light-headedness, numbness and headaches.  Psychiatric/Behavioral: Negative for behavioral problems and dysphoric mood. The patient is not nervous/anxious.  Objective:   Physical Exam  Constitutional: He appears well-developed and well-nourished. Non-toxic appearance. He does not appear ill. No distress.  HENT:  Head: Normocephalic and atraumatic.  Right Ear: Hearing, tympanic membrane, external ear and ear canal normal.  Left Ear: Hearing, tympanic membrane, external ear and ear canal normal.  Nose: Nose normal.  Mouth/Throat: Uvula is midline, oropharynx is clear and moist and mucous membranes are normal.  Eyes: Conjunctivae, EOM and lids are normal. Pupils are equal, round, and reactive to light. No foreign bodies found.  Neck: Trachea normal, normal range of motion and phonation normal. Neck supple. Carotid bruit is not present. No mass and no thyromegaly present.  Cardiovascular: Normal rate, regular rhythm, S1 normal, S2 normal, intact distal  pulses and normal pulses. Exam reveals no gallop.  No murmur heard.  Pulmonary/Chest: He has no wheezes. He has no rhonchi. He has rales the left lower field. None on  right this year. Chronic and stable.  Abdominal: Soft. Normal appearance and bowel sounds are normal. There is no hepatosplenomegaly. There is no tenderness. There is no rebound, no guarding and no CVA tenderness. No hernia.  Lymphadenopathy:  He has no cervical adenopathy.  Neurological: He is alert. He has normal strength and normal reflexes. No cranial nerve deficit or sensory deficit. Gait normal.  Skin: Skin is warm, dry and intact. No rash noted.  Psychiatric: He has a normal mood and affect. His speech is normal and behavior is normal. Judgment normal.  Diabetic foot exam:  Normal inspection  No skin breakdown  No calluses  Normal DP pulses  Normal sensation to light touch and monofilament  Nails normal  Assessment & Plan:   Annual Medicare Wellness: The patient's preventative maintenance and recommended screening tests for an annual wellness exam were reviewed in full today.  Brought up to date unless services declined.  Counselled on the importance of diet, exercise, and its role in overall health and mortality.  The patient's FH and SH was reviewed, including their home life, tobacco status, and drug and alcohol status.   Prostate: no PSA indicated. Vaccines: Up to date with flu, Tdap, PNA, offered shingles vaccine. Refused prevnar. Colonoscopy:adenomatous polyps ,  Pyrtle rec repeat screening colonoscopy in September 2016 Nonsmoker.

## 2014-06-20 NOTE — Progress Notes (Signed)
Pre visit review using our clinic review tool, if applicable. No additional management support is needed unless otherwise documented below in the visit note.

## 2014-06-20 NOTE — Patient Instructions (Signed)
Keep working on healthy eating, weight loss and exercise.

## 2014-06-30 ENCOUNTER — Telehealth: Payer: Self-pay | Admitting: *Deleted

## 2014-06-30 NOTE — Telephone Encounter (Signed)
Left message for Burech to return my call.

## 2014-06-30 NOTE — Telephone Encounter (Signed)
The patient has been on Omeprazole 40 mg for a long time.

## 2014-06-30 NOTE — Telephone Encounter (Signed)
Received fax from pharmacy ( [facility]) needing clarification re: omeprazole. They received an rx for omeprazole 20 mg caps a few days ago, but they also received an rx for 5m earlier this month. Which dose is he supposed to be on? I will put request in your inbox.

## 2014-07-01 NOTE — Telephone Encounter (Signed)
Left message for Harinder to return my call. 

## 2014-07-01 NOTE — Telephone Encounter (Signed)
Called and verified with Brian Bass as to which dose of omeprazole he was currently taking.  He states he was changed to Omeprazole 20 mg at last office visit.  Omeprazole 40 mg hurt his stomach.   Abby with [facility] pharmacy notified.

## 2014-07-17 NOTE — Assessment & Plan Note (Signed)
LDL at goal , trig s high on simvastatin 40 mg daily

## 2014-07-17 NOTE — Assessment & Plan Note (Signed)
Well controlled on no med.

## 2014-07-17 NOTE — Assessment & Plan Note (Signed)
  Diabetes:  Improving control on  Current dose on insulin 90 units daily Encouraged exercise, weight loss, healthy eating habits.

## 2014-08-21 ENCOUNTER — Other Ambulatory Visit: Payer: Self-pay | Admitting: Family Medicine

## 2014-08-22 ENCOUNTER — Other Ambulatory Visit: Payer: Self-pay | Admitting: Family Medicine

## 2014-08-22 NOTE — Telephone Encounter (Signed)
Patient's wife called and said the pharmacy didn't receive the prescription for the Glipizide.  Patient is out of his medication. Patient said the [facility] in [place].

## 2014-09-05 ENCOUNTER — Ambulatory Visit: Payer: Medicare PPO

## 2014-09-15 ENCOUNTER — Other Ambulatory Visit: Payer: Self-pay | Admitting: *Deleted

## 2014-09-15 MED ORDER — OMEPRAZOLE 20 MG PO CPDR: 20.0000 mg | DELAYED_RELEASE_CAPSULE | Freq: Every day | ORAL | Status: DC

## 2014-09-15 MED ORDER — GLUCOSE BLOOD VI STRP: ORAL_STRIP | Status: DC

## 2014-09-15 NOTE — Addendum Note (Signed)
Addended by: Carter Kitten on: 09/15/2014 12:37 PM   Modules accepted: Orders

## 2014-09-18 ENCOUNTER — Ambulatory Visit: Payer: Medicare PPO

## 2014-09-24 ENCOUNTER — Telehealth: Payer: Self-pay | Admitting: Family Medicine

## 2014-09-24 ENCOUNTER — Ambulatory Visit (INDEPENDENT_AMBULATORY_CARE_PROVIDER_SITE_OTHER): Payer: Medicare PPO

## 2014-09-24 DIAGNOSIS — Z23 Encounter for immunization: Secondary | ICD-10-CM

## 2014-09-24 NOTE — Telephone Encounter (Signed)
Appointment scheduled with Dr. Diona Browner 09/25/2014 at 9:00 am.

## 2014-09-24 NOTE — Telephone Encounter (Signed)
Pt is here to get flu shot and is requesting a referral to a surgeon for the place on his back, that he has been seen before previously.

## 2014-09-25 ENCOUNTER — Encounter: Payer: Self-pay | Admitting: Family Medicine

## 2014-09-25 ENCOUNTER — Ambulatory Visit (INDEPENDENT_AMBULATORY_CARE_PROVIDER_SITE_OTHER): Payer: Medicare PPO | Admitting: Family Medicine

## 2014-09-25 VITALS — BP 124/60 | HR 80 | Temp 97.4°F | Ht 66.65 in | Wt 171.5 lb

## 2014-09-25 DIAGNOSIS — L723 Sebaceous cyst: Secondary | ICD-10-CM

## 2014-09-25 DIAGNOSIS — L089 Local infection of the skin and subcutaneous tissue, unspecified: Secondary | ICD-10-CM | POA: Insufficient documentation

## 2014-09-25 DIAGNOSIS — E119 Type 2 diabetes mellitus without complications: Secondary | ICD-10-CM

## 2014-09-25 MED ORDER — DOXYCYCLINE HYCLATE 100 MG PO TABS: 100.0000 mg | ORAL_TABLET | Freq: Two times a day (BID) | ORAL | Status: DC

## 2014-09-25 NOTE — Progress Notes (Signed)
Pre visit review using our clinic review tool, if applicable. No additional management support is needed unless otherwise documented below in the visit note.

## 2014-09-25 NOTE — Patient Instructions (Signed)
Increase insulin back up gradually until FBS at goal < 120. Keep covered with gauze today.  Tommorow AM pull out gauze strip. Wash with warm water and soap.  Apply antibiotics ointment and gauze bandage. Irrigate and wash twice daily. Complete antibitoics. Follow up next Tuesday, call sooner if redness spreading.

## 2014-09-25 NOTE — Progress Notes (Signed)
Subjective:    Patient ID: Brian Bass, male    DOB: [birth year], 73 y.o.   MRN: [id number]  HPI 73 year old male with diabetes presents with infected cyst on left mid back.  Noted appear in last few months, drained some off and on , some last night.  Tender, red at site. No fever. No flu like illness.  He has had I and D in past for similar issue. Around 15-120 years ago.  FBS yesterday 240  Has dropped to 80 Units from 100... He did this on his own.    Review of Systems  Constitutional: Negative for fever and fatigue.  HENT: Negative for ear pain.   Eyes: Negative for pain.  Respiratory: Negative for cough and shortness of breath.   Cardiovascular: Negative for chest pain and leg swelling.       Objective:   Physical Exam  Constitutional: Vital signs are normal. He appears well-developed and well-nourished.  HENT:  Head: Normocephalic.  Right Ear: Hearing normal.  Left Ear: Hearing normal.  Nose: Nose normal.  Mouth/Throat: Oropharynx is clear and moist and mucous membranes are normal.  Neck: Trachea normal. Carotid bruit is not present. No mass and no thyromegaly present.  Cardiovascular: Normal rate, regular rhythm and normal pulses.  Exam reveals no gallop, no distant heart sounds and no friction rub.   No murmur heard. No peripheral edema  Pulmonary/Chest: Effort normal and breath sounds normal. No respiratory distress.  Skin: Skin is warm, dry and intact. No rash noted.     Red lesion, central pore, fluctuant, 1 inch in diameter.  Psychiatric: He has a normal mood and affect. His speech is normal and behavior is normal. Thought content normal.          Assessment & Plan:  I&D  Verbal informed consent obtained.  Pt aware of risks not limited to but including infection, bleeding, damage to near by organs.  Prep: etoh/betadine  Anesthesia: 2%lidocaine with epi, good effect  Incision made with #11 blade  Would explored and loculations  removed  Wound packed with iodoform gauze  Tolerated well  Routine postprocedure instructions d/w pt- remove packing in 24-48h, keep area clean and bandaged, follow up if concerns/spreading erythema/pain.

## 2014-09-28 LAB — WOUND CULTURE
GRAM STAIN: NONE SEEN
Gram Stain: NONE SEEN
Organism ID, Bacteria: NO GROWTH

## 2014-09-30 ENCOUNTER — Encounter: Payer: Self-pay | Admitting: Family Medicine

## 2014-09-30 ENCOUNTER — Ambulatory Visit (INDEPENDENT_AMBULATORY_CARE_PROVIDER_SITE_OTHER): Payer: Medicare PPO | Admitting: Family Medicine

## 2014-09-30 VITALS — BP 110/80 | HR 67 | Temp 98.1°F | Ht 66.65 in | Wt 172.0 lb

## 2014-09-30 DIAGNOSIS — L089 Local infection of the skin and subcutaneous tissue, unspecified: Secondary | ICD-10-CM

## 2014-09-30 DIAGNOSIS — L723 Sebaceous cyst: Secondary | ICD-10-CM

## 2014-09-30 NOTE — Progress Notes (Signed)
   Subjective:    Patient ID: Brian Bass, male    DOB: 1941-06-30, 73 y.o.   MRN: [id number]  HPI  73 year old male with DM presents for 5 day follow up I and D of infected sebaceous cyst on back.  He is on day 5 of 10 of doxycycline.  He reports less redness, no pain, some ithcing. No drainage.  Wife changing bandage daily, warm water wash.  No fever.  Blood sugars are stable.    Review of Systems  Constitutional: Negative for fever and fatigue.  Respiratory: Negative for shortness of breath.   Cardiovascular: Negative for chest pain.       Objective:   Physical Exam  Constitutional: Vital signs are normal. He appears well-developed and well-nourished.  HENT:  Head: Normocephalic.  Right Ear: Hearing normal.  Left Ear: Hearing normal.  Nose: Nose normal.  Mouth/Throat: Oropharynx is clear and moist and mucous membranes are normal.  Neck: Trachea normal. Carotid bruit is not present. No mass and no thyromegaly present.  Cardiovascular: Normal rate, regular rhythm and normal pulses.  Exam reveals no gallop, no distant heart sounds and no friction rub.   No murmur heard. No peripheral edema  Pulmonary/Chest: Effort normal and breath sounds normal. No respiratory distress.  Skin: Skin is warm, dry and intact. No rash noted.     Probed for loculations with Qtip and peroxide, none found. Large pocket remains.  Minimal surrounding erythema. No discharge.  Psychiatric: He has a normal mood and affect. His speech is normal and behavior is normal. Thought content normal.          Assessment & Plan:

## 2014-09-30 NOTE — Assessment & Plan Note (Signed)
Resolving

## 2014-09-30 NOTE — Progress Notes (Signed)
Pre visit review using our clinic review tool, if applicable. No additional management support is needed unless otherwise documented below in the visit note.

## 2014-09-30 NOTE — Patient Instructions (Signed)
Wash with warm water in shower. Complete antibiotics.  Call if redness spreading or new fever.

## 2014-12-15 ENCOUNTER — Telehealth: Payer: Self-pay | Admitting: Family Medicine

## 2014-12-15 DIAGNOSIS — E7800 Pure hypercholesterolemia, unspecified: Secondary | ICD-10-CM

## 2014-12-15 DIAGNOSIS — E1165 Type 2 diabetes mellitus with hyperglycemia: Principal | ICD-10-CM

## 2014-12-15 DIAGNOSIS — IMO0001 Reserved for inherently not codable concepts without codable children: Secondary | ICD-10-CM

## 2014-12-15 NOTE — Telephone Encounter (Signed)
-----  Message from Ellamae Sia sent at 12/10/2014  3:27 PM EST ----- Regarding: Lab orders for Tuesday, 1.12.16 Lab orders for a 6 month f/u

## 2014-12-16 ENCOUNTER — Other Ambulatory Visit (INDEPENDENT_AMBULATORY_CARE_PROVIDER_SITE_OTHER): Payer: Commercial Managed Care - HMO

## 2014-12-16 DIAGNOSIS — E1165 Type 2 diabetes mellitus with hyperglycemia: Secondary | ICD-10-CM | POA: Diagnosis not present

## 2014-12-16 DIAGNOSIS — IMO0001 Reserved for inherently not codable concepts without codable children: Secondary | ICD-10-CM

## 2014-12-16 DIAGNOSIS — E7800 Pure hypercholesterolemia, unspecified: Secondary | ICD-10-CM

## 2014-12-16 LAB — HEMOGLOBIN A1C: Hgb A1c MFr Bld: 9.1 % — ABNORMAL HIGH (ref 4.6–6.5)

## 2014-12-16 LAB — COMPREHENSIVE METABOLIC PANEL
ALBUMIN: 4.4 g/dL (ref 3.5–5.2)
ALK PHOS: 48 U/L (ref 39–117)
ALT: 29 U/L (ref 0–53)
AST: 24 U/L (ref 0–37)
BUN: 17 mg/dL (ref 6–23)
CO2: 26 mEq/L (ref 19–32)
CREATININE: 1.2 mg/dL (ref 0.4–1.5)
Calcium: 9.4 mg/dL (ref 8.4–10.5)
Chloride: 104 mEq/L (ref 96–112)
GFR: 61.06 mL/min (ref >60.00)
Glucose, Bld: 225 mg/dL — ABNORMAL HIGH (ref 70–99)
POTASSIUM: 4.8 meq/L (ref 3.5–5.1)
Sodium: 135 mEq/L (ref 135–145)
TOTAL PROTEIN: 7.5 g/dL (ref 6.0–8.3)
Total Bilirubin: 0.9 mg/dL (ref 0.2–1.2)

## 2014-12-16 LAB — LIPID PANEL
CHOL/HDL RATIO: 4
CHOLESTEROL: 149 mg/dL (ref 0–200)
HDL: 33.3 mg/dL — ABNORMAL LOW (ref >39.00)
NonHDL: 115.7
TRIGLYCERIDES: 292 mg/dL — AB (ref 0.0–149.0)
VLDL: 58.4 mg/dL — ABNORMAL HIGH (ref 0.0–40.0)

## 2014-12-16 LAB — LDL CHOLESTEROL, DIRECT: Direct LDL: 69.3 mg/dL

## 2014-12-23 ENCOUNTER — Ambulatory Visit (INDEPENDENT_AMBULATORY_CARE_PROVIDER_SITE_OTHER): Payer: Commercial Managed Care - HMO | Admitting: Family Medicine

## 2014-12-23 ENCOUNTER — Encounter: Payer: Self-pay | Admitting: Family Medicine

## 2014-12-23 VITALS — BP 146/76 | HR 79 | Temp 97.5°F | Ht 66.65 in | Wt 180.5 lb

## 2014-12-23 DIAGNOSIS — E7800 Pure hypercholesterolemia, unspecified: Secondary | ICD-10-CM

## 2014-12-23 DIAGNOSIS — E1165 Type 2 diabetes mellitus with hyperglycemia: Secondary | ICD-10-CM

## 2014-12-23 DIAGNOSIS — IMO0001 Reserved for inherently not codable concepts without codable children: Secondary | ICD-10-CM

## 2014-12-23 DIAGNOSIS — I2510 Atherosclerotic heart disease of native coronary artery without angina pectoris: Secondary | ICD-10-CM

## 2014-12-23 LAB — HM DIABETES FOOT EXAM

## 2014-12-23 NOTE — Patient Instructions (Addendum)
Get back on track with diet and healthy eating. Work on increasing exercise. Restart taking simvastatin daily. Schedule medicare wellness with labs prior in 6 month.

## 2014-12-23 NOTE — Assessment & Plan Note (Signed)
Good control LDL, but trigs high Start taking simvastatin EVERY day. Encouraged exercise, weight loss, healthy eating habits.

## 2014-12-23 NOTE — Progress Notes (Signed)
74 year old male presents for 6 month follow up/  Hypertension: Well controlled at goal off lisinopril  BP Readings from Last 3 Encounters:  12/23/14 146/76  09/30/14 110/80  09/25/14 124/60  Using medication without problems or lightheadedness: None Chest pain with exertion:None  Edema:None  Short of breath:None  Average home BPs: 120/70  Other issues:  Elevated Cholesterol: LDL at goal , tris high on simvastatin 40 mg daily ( forgets to take several times a week) Lab Results  Component Value Date   CHOL 149 12/16/2014   HDL 33.30* 12/16/2014   LDLCALC 51 06/17/2014   LDLDIRECT 69.3 12/16/2014   TRIG 292.0* 12/16/2014   CHOLHDL 4 12/16/2014  Using medications without problems: None Muscle aches: None Diet compliance: Moderate, but worse lately. Exercise: Walking 1 mile a day Other complaints: CAD   Diabetes: Worsening control on current dose on insulin 90 units daily as wells as metformin and glipizide. Lab Results  Component Value Date   HGBA1C 9.1* 12/16/2014  Using medications without difficulties: None Hypoglycemic episodes: none  Hyperglycemic episodes: yes Feet problems:None  Blood Sugars averaging: See above.. Checking 2 times a day, FBS 190-200, 2 hours pp: > 200 eye exam within last year: Yes   Has gained weight back over the holidays! Not sticking to diet.  Wt Readings from Last 3 Encounters:  12/23/14 180 lb 8 oz (81.874 kg)  09/30/14 172 lb (78.019 kg)  09/25/14 171 lb 8 oz (77.792 kg)   Review of Systems  Constitutional: Negative for fever, fatigue and unexpected weight change.  HENT: Negative for ear pain, congestion, sore throat, rhinorrhea, trouble swallowing and postnasal drip.  Eyes: Negative for pain.  Respiratory: Negative for cough, shortness of breath and wheezing.  Cardiovascular: Negative for chest pain, palpitations and leg swelling.  Gastrointestinal: Negative for nausea, abdominal pain, diarrhea, constipation and  blood in stool.  Genitourinary: Positive for urgency, frequency and difficulty urinating. Negative for dysuria, hematuria, discharge, penile swelling, scrotal swelling, penile pain and testicular pain.  Skin: Negative for rash.  Neurological: Negative for syncope, weakness, light-headedness, numbness and headaches.  Psychiatric/Behavioral: Negative for behavioral problems and dysphoric mood. The patient is not nervous/anxious.  Objective:   Physical Exam  Constitutional: He appears well-developed and well-nourished. Non-toxic appearance. He does not appear ill. No distress.  HENT:  Head: Normocephalic and atraumatic.  Right Ear: Hearing, tympanic membrane, external ear and ear canal normal.  Left Ear: Hearing, tympanic membrane, external ear and ear canal normal.  Nose: Nose normal.  Mouth/Throat: Uvula is midline, oropharynx is clear and moist and mucous membranes are normal.  Eyes: Conjunctivae, EOM and lids are normal. Pupils are equal, round, and reactive to light. No foreign bodies found.  Neck: Trachea normal, normal range of motion and phonation normal. Neck supple. Carotid bruit is not present. No mass and no thyromegaly present.  Cardiovascular: Normal rate, regular rhythm, S1 normal, S2 normal, intact distal pulses and normal pulses. Exam reveals no gallop.  No murmur heard.  Pulmonary/Chest: He has no wheezes. He has no rhonchi. He has rales the left lower field. None on right this year. Chronic and stable.  Abdominal: Soft. Normal appearance and bowel sounds are normal. There is no hepatosplenomegaly. There is no tenderness. There is no rebound, no guarding and no CVA tenderness. No hernia.  Lymphadenopathy:  He has no cervical adenopathy.  Neurological: He is alert. He has normal strength and normal reflexes. No cranial nerve deficit or sensory deficit. Gait normal.  Skin:  Skin is warm, dry and intact. No rash noted.  Psychiatric: He has a normal mood and  affect. His speech is normal and behavior is normal. Judgment normal.  Diabetic foot exam:  Normal inspection  No skin breakdown  No calluses  Normal DP pulses  Normal sensation to light touch and monofilament  Nails normal

## 2014-12-23 NOTE — Progress Notes (Signed)
Pre visit review using our clinic review tool, if applicable. No additional management support is needed unless otherwise documented below in the visit note.

## 2014-12-23 NOTE — Assessment & Plan Note (Signed)
Asymptomatic. Consider re-eval of stress test at medicare wellness.

## 2014-12-23 NOTE — Assessment & Plan Note (Signed)
Get back on track with lifestyle. Get more active. Pt not interested in adjusting medication at this time. ptr cannot follow up in 3 months given out of town.. Will recheck in 6 months.

## 2015-03-05 ENCOUNTER — Other Ambulatory Visit: Payer: Self-pay | Admitting: Family Medicine

## 2015-04-16 ENCOUNTER — Other Ambulatory Visit: Payer: Self-pay | Admitting: Family Medicine

## 2015-05-13 ENCOUNTER — Encounter: Payer: Self-pay | Admitting: Gastroenterology

## 2015-06-25 ENCOUNTER — Other Ambulatory Visit: Payer: Self-pay | Admitting: Family Medicine

## 2015-07-01 ENCOUNTER — Other Ambulatory Visit: Payer: Medicare PPO

## 2015-07-02 ENCOUNTER — Telehealth: Payer: Self-pay | Admitting: Family Medicine

## 2015-07-02 DIAGNOSIS — E7800 Pure hypercholesterolemia, unspecified: Secondary | ICD-10-CM

## 2015-07-02 DIAGNOSIS — IMO0001 Reserved for inherently not codable concepts without codable children: Secondary | ICD-10-CM

## 2015-07-02 DIAGNOSIS — M1049 Other secondary gout, multiple sites: Secondary | ICD-10-CM

## 2015-07-02 DIAGNOSIS — E1165 Type 2 diabetes mellitus with hyperglycemia: Principal | ICD-10-CM

## 2015-07-02 NOTE — Telephone Encounter (Signed)
-----  Message from Ellamae Sia sent at 07/01/2015  6:23 PM EDT ----- Regarding: Lab orders for Wednesday, 8.3.16 Patient is scheduled for CPX labs, please order future labs, Thanks , Karna Christmas

## 2015-07-06 DIAGNOSIS — H5210 Myopia, unspecified eye: Secondary | ICD-10-CM | POA: Diagnosis not present

## 2015-07-06 DIAGNOSIS — H524 Presbyopia: Secondary | ICD-10-CM | POA: Diagnosis not present

## 2015-07-06 LAB — HM DIABETES EYE EXAM

## 2015-07-07 ENCOUNTER — Encounter: Payer: Medicare PPO | Admitting: Family Medicine

## 2015-07-08 ENCOUNTER — Other Ambulatory Visit (INDEPENDENT_AMBULATORY_CARE_PROVIDER_SITE_OTHER): Payer: Medicare PPO

## 2015-07-08 DIAGNOSIS — IMO0002 Reserved for concepts with insufficient information to code with codable children: Secondary | ICD-10-CM

## 2015-07-08 DIAGNOSIS — M1049 Other secondary gout, multiple sites: Secondary | ICD-10-CM | POA: Diagnosis not present

## 2015-07-08 DIAGNOSIS — E7800 Pure hypercholesterolemia, unspecified: Secondary | ICD-10-CM

## 2015-07-08 DIAGNOSIS — E1165 Type 2 diabetes mellitus with hyperglycemia: Secondary | ICD-10-CM | POA: Diagnosis not present

## 2015-07-08 DIAGNOSIS — IMO0001 Reserved for inherently not codable concepts without codable children: Secondary | ICD-10-CM

## 2015-07-08 LAB — LIPID PANEL
CHOLESTEROL: 139 mg/dL (ref 0–200)
HDL: 38.9 mg/dL — ABNORMAL LOW (ref >39.00)
NonHDL: 100.17
Total CHOL/HDL Ratio: 4
Triglycerides: 226 mg/dL — ABNORMAL HIGH (ref 0.0–149.0)
VLDL: 45.2 mg/dL — ABNORMAL HIGH (ref 0.0–40.0)

## 2015-07-08 LAB — COMPREHENSIVE METABOLIC PANEL
ALT: 28 U/L (ref 0–53)
AST: 24 U/L (ref 0–37)
Albumin: 4.4 g/dL (ref 3.5–5.2)
Alkaline Phosphatase: 53 U/L (ref 39–117)
BILIRUBIN TOTAL: 0.5 mg/dL (ref 0.2–1.2)
BUN: 15 mg/dL (ref 6–23)
CO2: 29 mEq/L (ref 19–32)
CREATININE: 1.12 mg/dL (ref 0.40–1.50)
Calcium: 10 mg/dL (ref 8.4–10.5)
Chloride: 103 mEq/L (ref 96–112)
GFR: 67.93 mL/min (ref >60.00)
GLUCOSE: 111 mg/dL — AB (ref 70–99)
POTASSIUM: 4.3 meq/L (ref 3.5–5.1)
Sodium: 139 mEq/L (ref 135–145)
Total Protein: 7.3 g/dL (ref 6.0–8.3)

## 2015-07-08 LAB — URIC ACID: URIC ACID, SERUM: 6.8 mg/dL (ref 4.0–7.8)

## 2015-07-08 LAB — LDL CHOLESTEROL, DIRECT: Direct LDL: 74 mg/dL

## 2015-07-08 LAB — MICROALBUMIN / CREATININE URINE RATIO
CREATININE, U: 122.1 mg/dL
MICROALB UR: 1.1 mg/dL (ref 0.0–1.9)
Microalb Creat Ratio: 0.9 mg/g (ref 0.0–30.0)

## 2015-07-08 LAB — HEMOGLOBIN A1C: Hgb A1c MFr Bld: 8 % — ABNORMAL HIGH (ref 4.6–6.5)

## 2015-07-14 ENCOUNTER — Ambulatory Visit (INDEPENDENT_AMBULATORY_CARE_PROVIDER_SITE_OTHER): Payer: Medicare PPO | Admitting: Family Medicine

## 2015-07-14 ENCOUNTER — Encounter: Payer: Self-pay | Admitting: Family Medicine

## 2015-07-14 VITALS — BP 122/68 | HR 76 | Temp 97.5°F | Ht 67.0 in | Wt 172.0 lb

## 2015-07-14 DIAGNOSIS — I10 Essential (primary) hypertension: Secondary | ICD-10-CM | POA: Diagnosis not present

## 2015-07-14 DIAGNOSIS — E7800 Pure hypercholesterolemia, unspecified: Secondary | ICD-10-CM

## 2015-07-14 DIAGNOSIS — E1165 Type 2 diabetes mellitus with hyperglycemia: Secondary | ICD-10-CM

## 2015-07-14 DIAGNOSIS — Z0000 Encounter for general adult medical examination without abnormal findings: Secondary | ICD-10-CM

## 2015-07-14 DIAGNOSIS — Z7189 Other specified counseling: Secondary | ICD-10-CM | POA: Insufficient documentation

## 2015-07-14 DIAGNOSIS — IMO0001 Reserved for inherently not codable concepts without codable children: Secondary | ICD-10-CM

## 2015-07-14 LAB — HM DIABETES FOOT EXAM

## 2015-07-14 MED ORDER — CANAGLIFLOZIN 100 MG PO TABS: 100.0000 mg | ORAL_TABLET | Freq: Every day | ORAL | Status: DC

## 2015-07-14 NOTE — Progress Notes (Signed)
Pre visit review using our clinic review tool, if applicable. No additional management support is needed unless otherwise documented below in the visit note.

## 2015-07-14 NOTE — Assessment & Plan Note (Signed)
Well controlled on no med.

## 2015-07-14 NOTE — Progress Notes (Signed)
I have personally reviewed the Medicare Annual Wellness questionnaire and have noted 1. The patient's medical and social history 2. Their use of alcohol, tobacco or illicit drugs 3. Their current medications and supplements 4. The patient's functional ability including ADL's, fall risks, home safety risks and hearing or visual             impairment. 5. Diet and physical activities 6. Evidence for depression or mood disorders 7.         Updated provider list Cognitive evaluation was performed and recorded on pt medicare questionnaire form. The patients weight, height, BMI and visual acuity have been recorded in the chart  I have made referrals, counseling and provided education to the patient based review of the above and I have provided the pt with a written personalized care plan for preventive services.    Hypertension: Well controlled at goal off lisinopril  BP Readings from Last 3 Encounters:  07/14/15 122/68  12/23/14 146/76  09/30/14 110/80  Using medication without problems or lightheadedness: None Chest pain with exertion:None  Edema:None  Short of breath:None  Average home BPs: 120/70  Other issues:  Elevated Cholesterol: LDL at goal , trigs high on simvastatin 40 mg daily Lab Results  Component Value Date   CHOL 139 07/08/2015   HDL 38.90* 07/08/2015   LDLCALC 51 06/17/2014   LDLDIRECT 74.0 07/08/2015   TRIG 226.0* 07/08/2015   CHOLHDL 4 07/08/2015  Using medications without problems: None Muscle aches: None Diet compliance: Poor eating habits, not sticking to DM diet. Exercise: Walking 1 mile a day Other complaints: CAD   Diabetes: Inadequate but improved control on current dose on insulin 90 units daily, metformin (not using dose as prescribed given it gives him diarrhea) , glucotrol max. Lab Results  Component Value Date   HGBA1C 8.0* 07/08/2015  Using medications without difficulties:  Hypoglycemic episodes: none  Hyperglycemic episodes:  rarely Feet problems:None  Blood Sugars averaging: See above.. Checking 2 times a day, FBS 200, 2 hours pp: 200 eye exam within last year: Yes  Poor eating habits, not sticking to DM diet.    Wt Readings from Last 3 Encounters:  07/14/15 172 lb (78.019 kg)  12/23/14 180 lb 8 oz (81.874 kg)  09/30/14 172 lb (78.019 kg)   BPH: off tamsulosin    reports that he has never smoked. He has never used smokeless tobacco. He reports that he does not drink alcohol or use illicit drugs. family history includes Alcohol abuse in his brother; Aneurysm in his father; Heart failure in his mother.   Review of Systems  Constitutional: Negative for fever, fatigue and unexpected weight change.  HENT: Negative for ear pain, congestion, sore throat, rhinorrhea, trouble swallowing and postnasal drip.  Eyes: Negative for pain.  Respiratory: Negative for cough, shortness of breath and wheezing.  Cardiovascular: Negative for chest pain, palpitations and leg swelling.  Gastrointestinal: Negative for nausea, abdominal pain, diarrhea, constipation and blood in stool.  Genitourinary: Positive for urgency, frequency and difficulty urinating. Negative for dysuria, hematuria, discharge, penile swelling, scrotal swelling, penile pain and testicular pain.  Skin: Negative for rash.  Neurological: Negative for syncope, weakness, light-headedness, numbness and headaches.  Psychiatric/Behavioral: Negative for behavioral problems and dysphoric mood. The patient is not nervous/anxious.  Objective:   Physical Exam  Constitutional: He appears well-developed and well-nourished. Non-toxic appearance. He does not appear ill. No distress.  HENT:  Head: Normocephalic and atraumatic.  Right Ear: Hearing, tympanic membrane, external ear and ear canal  normal.  Left Ear: Hearing, tympanic membrane, external ear and ear canal normal.  Nose: Nose normal.  Mouth/Throat: Uvula is midline, oropharynx is clear and  moist and mucous membranes are normal.  Eyes: Conjunctivae, EOM and lids are normal. Pupils are equal, round, and reactive to light. No foreign bodies found.  Neck: Trachea normal, normal range of motion and phonation normal. Neck supple. Carotid bruit is not present. No mass and no thyromegaly present.  Cardiovascular: Normal rate, regular rhythm, S1 normal, S2 normal, intact distal pulses and normal pulses. Exam reveals no gallop.  No murmur heard.  Pulmonary/Chest: He has no wheezes. He has no rhonchi. He has rales the left lower field. None on right this year. Chronic and stable.  Abdominal: Soft. Normal appearance and bowel sounds are normal. There is no hepatosplenomegaly. There is no tenderness. There is no rebound, no guarding and no CVA tenderness. No hernia.  Lymphadenopathy:  He has no cervical adenopathy.  Neurological: He is alert. He has normal strength and normal reflexes. No cranial nerve deficit or sensory deficit. Gait normal.  Skin: Skin is warm, dry and intact. No rash noted.  Psychiatric: He has a normal mood and affect. His speech is normal and behavior is normal. Judgment normal.  Diabetic foot exam:  Normal inspection  No skin breakdown  No calluses  Normal DP pulses  Normal sensation to light touch and monofilament  Nails normal  Assessment & Plan:   Annual Medicare Wellness: The patient's preventative maintenance and recommended screening tests for an annual wellness exam were reviewed in full today.  Brought up to date unless services declined.  Counselled on the importance of diet, exercise, and its role in overall health and mortality.  The patient's FH and SH was reviewed, including their home life, tobacco status, and drug and alcohol status.   Prostate: no PSA indicated. Vaccines: Up to date with flu, Tdap, PNA, offered shingles vaccine. Refused prevnar. Colonoscopy:adenomatous polyps, Pyrtle rec repeat screening colonoscopy in September  2016 Nonsmoker.

## 2015-07-14 NOTE — Assessment & Plan Note (Signed)
Pt to be on lower dose metfromin  Given higher causes diarrhea.  Add invokana to insulin and glucotrol.  Follow up in 3 months for A1C.

## 2015-07-14 NOTE — Patient Instructions (Addendum)
Take metformin 500 mg twice daily every day. Start invokana daily.  If not contacted by Brian Bass  For colonoscopy.. Call them to inquire.

## 2015-07-14 NOTE — Assessment & Plan Note (Signed)
LDL at goal on simvastatin < 70, but trig high due to poor sugar control. Encouraged exercise, weight loss, healthy eating habits.

## 2015-07-18 ENCOUNTER — Other Ambulatory Visit: Payer: Self-pay | Admitting: Family Medicine

## 2015-07-20 ENCOUNTER — Encounter: Payer: Self-pay | Admitting: Family Medicine

## 2015-08-01 ENCOUNTER — Other Ambulatory Visit: Payer: Self-pay | Admitting: Family Medicine

## 2015-08-02 ENCOUNTER — Encounter: Payer: Self-pay | Admitting: Internal Medicine

## 2015-10-02 ENCOUNTER — Ambulatory Visit (INDEPENDENT_AMBULATORY_CARE_PROVIDER_SITE_OTHER): Payer: Commercial Managed Care - HMO

## 2015-10-02 DIAGNOSIS — Z23 Encounter for immunization: Secondary | ICD-10-CM

## 2015-10-05 ENCOUNTER — Telehealth: Payer: Self-pay | Admitting: Internal Medicine

## 2015-10-05 NOTE — Telephone Encounter (Signed)
Previous colon that was done in 2011 was done at [facility]. Pt is fine to have colon in the [place]. Pts wife aware.

## 2015-10-12 ENCOUNTER — Other Ambulatory Visit (INDEPENDENT_AMBULATORY_CARE_PROVIDER_SITE_OTHER): Payer: Commercial Managed Care - HMO

## 2015-10-12 ENCOUNTER — Telehealth: Payer: Self-pay | Admitting: Family Medicine

## 2015-10-12 DIAGNOSIS — IMO0001 Reserved for inherently not codable concepts without codable children: Secondary | ICD-10-CM

## 2015-10-12 DIAGNOSIS — E1165 Type 2 diabetes mellitus with hyperglycemia: Secondary | ICD-10-CM | POA: Diagnosis not present

## 2015-10-12 LAB — COMPREHENSIVE METABOLIC PANEL
ALBUMIN: 4.2 g/dL (ref 3.5–5.2)
ALT: 29 U/L (ref 0–53)
AST: 21 U/L (ref 0–37)
Alkaline Phosphatase: 55 U/L (ref 39–117)
BILIRUBIN TOTAL: 0.7 mg/dL (ref 0.2–1.2)
BUN: 21 mg/dL (ref 6–23)
CALCIUM: 9.9 mg/dL (ref 8.4–10.5)
CO2: 29 mEq/L (ref 19–32)
Chloride: 102 mEq/L (ref 96–112)
Creatinine, Ser: 1.19 mg/dL (ref 0.40–1.50)
GFR: 63.29 mL/min (ref >60.00)
Glucose, Bld: 185 mg/dL — ABNORMAL HIGH (ref 70–99)
Potassium: 4.7 mEq/L (ref 3.5–5.1)
Sodium: 137 mEq/L (ref 135–145)
Total Protein: 6.9 g/dL (ref 6.0–8.3)

## 2015-10-12 LAB — LIPID PANEL
Cholesterol: 146 mg/dL (ref 0–200)
HDL: 33.8 mg/dL — AB (ref >39.00)
NonHDL: 112.38
TRIGLYCERIDES: 254 mg/dL — AB (ref 0.0–149.0)
Total CHOL/HDL Ratio: 4
VLDL: 50.8 mg/dL — ABNORMAL HIGH (ref 0.0–40.0)

## 2015-10-12 LAB — LDL CHOLESTEROL, DIRECT: LDL DIRECT: 68 mg/dL

## 2015-10-12 LAB — HEMOGLOBIN A1C: Hgb A1c MFr Bld: 8.3 % — ABNORMAL HIGH (ref 4.6–6.5)

## 2015-10-12 NOTE — Telephone Encounter (Signed)
-----  Message from Ellamae Sia sent at 10/08/2015 10:57 AM EDT ----- Regarding: Lab orders for Monday, 11.7.16 Lab orders for a 3 month follow up appt.

## 2015-10-16 ENCOUNTER — Ambulatory Visit (INDEPENDENT_AMBULATORY_CARE_PROVIDER_SITE_OTHER): Payer: Commercial Managed Care - HMO | Admitting: Family Medicine

## 2015-10-16 ENCOUNTER — Encounter: Payer: Self-pay | Admitting: Family Medicine

## 2015-10-16 VITALS — BP 116/64 | HR 90 | Temp 97.3°F | Ht 67.0 in | Wt 174.2 lb

## 2015-10-16 DIAGNOSIS — E1165 Type 2 diabetes mellitus with hyperglycemia: Secondary | ICD-10-CM

## 2015-10-16 DIAGNOSIS — E7800 Pure hypercholesterolemia, unspecified: Secondary | ICD-10-CM

## 2015-10-16 DIAGNOSIS — IMO0001 Reserved for inherently not codable concepts without codable children: Secondary | ICD-10-CM

## 2015-10-16 DIAGNOSIS — I10 Essential (primary) hypertension: Secondary | ICD-10-CM

## 2015-10-16 DIAGNOSIS — K2100 Gastro-esophageal reflux disease with esophagitis, without bleeding: Secondary | ICD-10-CM

## 2015-10-16 DIAGNOSIS — R002 Palpitations: Secondary | ICD-10-CM

## 2015-10-16 LAB — HM DIABETES FOOT EXAM

## 2015-10-16 NOTE — Assessment & Plan Note (Signed)
Pt associates with omeprazole.  Refuses work up at this time with EKG.., no red flags.  Pt is dizzy with palp. And has history of CAD.  If not improving off PII.Brian Bass Pt will call for further eval.

## 2015-10-16 NOTE — Assessment & Plan Note (Signed)
Worsened control. Pt never took invocana.. Was not aware we talked about it!  ? If cost was to high at pharm.. Pt will look into and other covered optioins.. Encouraged exercise, weight loss, healthy eating habits.

## 2015-10-16 NOTE — Progress Notes (Signed)
Subjective:    Patient ID: Brian Bass, male    DOB: September 20, 1941, 74 y.o.   MRN: [id number]  HPI   74 year old male presents for DM follow up 3 months.   Hypertension: Well controlled at goal off lisinopril  BP Readings from Last 3 Encounters:  10/16/15 116/64  07/14/15 122/68  12/23/14 146/76  Using medication without problems or lightheadedness: None Chest pain with exertion:None  Edema:None  Short of breath:None  Average home BPs: 120/70  Other issues:  Elevated Cholesterol: LDL at goal , trigs high on simvastatin 40 mg daily Lab Results  Component Value Date   CHOL 146 10/12/2015   HDL 33.80* 10/12/2015   LDLCALC 51 06/17/2014   LDLDIRECT 68.0 10/12/2015   TRIG 254.0* 10/12/2015   CHOLHDL 4 10/12/2015   Using medications without problems: None Muscle aches: None Diet compliance: Poor eating habits, not sticking to DM diet. Exercise: Walking 1 mile a day Other complaints: CAD   Diabetes: Inadequate control on current dose on insulin 90 units daily, metformin (not using dose as prescribed given it gives him diarrhea) , glucotrol max. Added invokana to regimen at last OV , he never started on this. Lab Results  Component Value Date   HGBA1C 8.3* 10/12/2015  Using medications without difficulties:  Hypoglycemic episodes: none  Hyperglycemic episodes: rarely Feet problems:None  Blood Sugars averaging: See above.. Checking 2 times a day, FBS 200, 2 hours pp: 200 eye exam within last year: Yes  Poor eating habits, not sticking to DM diet.               GERD.. On omeprazole.[name] He feels it is causing fast heart rate, dizzy spells occuring after taking the medication, worse if taking an extras pill.. No chest pain.  Hx of CAD. No swelling ankles. NO exertional symptoms, no Dyspnea. Refuses EKG today.. " I am sure it is the medication"   . Social History /Family History/Past Medical History reviewed and updated if needed.     Review of  Systems  Constitutional: Negative for fever and fatigue.  HENT: Negative for ear pain.   Eyes: Negative for pain.  Respiratory: Negative for cough and shortness of breath.   Cardiovascular: Positive for palpitations. Negative for chest pain and leg swelling.  Gastrointestinal: Negative for abdominal pain.  Genitourinary: Negative for dysuria.  Musculoskeletal: Negative for arthralgias.  Neurological: Positive for dizziness. Negative for syncope and headaches.  Psychiatric/Behavioral: Negative for dysphoric mood.       Objective:   Physical Exam  Constitutional: Vital signs are normal. He appears well-developed and well-nourished.  HENT:  Head: Normocephalic.  Right Ear: Hearing normal.  Left Ear: Hearing normal.  Nose: Nose normal.  Mouth/Throat: Oropharynx is clear and moist and mucous membranes are normal.  Neck: Trachea normal. Carotid bruit is not present. No thyroid mass and no thyromegaly present.  Cardiovascular: Normal rate, regular rhythm and normal pulses.  Exam reveals no gallop, no distant heart sounds and no friction rub.   No murmur heard. No peripheral edema  Pulmonary/Chest: Effort normal and breath sounds normal. No respiratory distress.  Skin: Skin is warm, dry and intact. No rash noted.  Psychiatric: He has a normal mood and affect. His speech is normal and behavior is normal. Thought content normal.    Diabetic foot exam: Normal inspection No skin breakdown No calluses  Normal DP pulses Normal sensation to light touch and monofilament Nails long.       Assessment & Plan:

## 2015-10-16 NOTE — Progress Notes (Signed)
Pre visit review using our clinic review tool, if applicable. No additional management support is needed unless otherwise documented below in the visit note.

## 2015-10-16 NOTE — Assessment & Plan Note (Signed)
Well controlled. Continue current medication.  

## 2015-10-16 NOTE — Assessment & Plan Note (Signed)
Control may worsen given starting  H2 Blocker instead of PPI given SE.

## 2015-10-16 NOTE — Patient Instructions (Addendum)
Look into  [place] cost at pharmacy.. Let me know if they cover it for a different option.   Stop omeprazole. Call and lets me know if dizzy spells and fast heart rate are continuing.  Instead can try twice dailyl zantac or pepcid AC.

## 2015-10-16 NOTE — Assessment & Plan Note (Signed)
Trig high but LDL remains at goal on current med.

## 2015-11-24 ENCOUNTER — Ambulatory Visit (AMBULATORY_SURGERY_CENTER): Payer: Self-pay

## 2015-11-24 VITALS — Ht 67.0 in | Wt 175.8 lb

## 2015-11-24 DIAGNOSIS — Z86010 Personal history of colonic polyps: Secondary | ICD-10-CM

## 2015-11-24 DIAGNOSIS — Z800 Family history of malignant neoplasm of digestive organs: Secondary | ICD-10-CM

## 2015-11-24 MED ORDER — NA SULFATE-K SULFATE-MG SULF 17.5-3.13-1.6 GM/177ML PO SOLN: ORAL | Status: DC

## 2015-11-24 NOTE — Progress Notes (Signed)
Per pt, no allergies to soy or egg products.Pt not taking any weight loss meds or using  O2 at home.    Pt came into the office today for his pre-visit prior to his colon on 12/08/15 with Dr Hilarie Fredrickson.The pt states he does need a endoscopy soon, due to having to chew his food more.He refused to schedule it now and wanted to get the colonoscopy done first and will discuss it with the doctor .He will call back later to schedule the endoscopy.

## 2015-12-08 ENCOUNTER — Encounter: Payer: Commercial Managed Care - HMO | Admitting: Internal Medicine

## 2015-12-08 ENCOUNTER — Telehealth: Payer: Self-pay | Admitting: Internal Medicine

## 2015-12-08 NOTE — Telephone Encounter (Signed)
No charge

## 2015-12-11 ENCOUNTER — Ambulatory Visit (INDEPENDENT_AMBULATORY_CARE_PROVIDER_SITE_OTHER): Payer: Commercial Managed Care - HMO | Admitting: Family Medicine

## 2015-12-11 ENCOUNTER — Encounter: Payer: Self-pay | Admitting: Family Medicine

## 2015-12-11 VITALS — BP 134/82 | HR 90 | Temp 97.7°F | Ht 67.0 in | Wt 174.6 lb

## 2015-12-11 DIAGNOSIS — J069 Acute upper respiratory infection, unspecified: Secondary | ICD-10-CM

## 2015-12-11 NOTE — Patient Instructions (Addendum)
Nice to meet you. Your symptoms are likely related to a virus. I'm glad you're continuing to improve. You can take Claritin over-the-counter for the postnasal drip. You can use honey for the hoarseness. You can also do saltwater gargles. If you develop chest pain, shortness of breath, difficulty swallowing, fever, or recurrence of symptoms or any new or changing symptoms please seek medical attention.

## 2015-12-11 NOTE — Assessment & Plan Note (Signed)
Patient's symptoms most consistent with resolving viral upper respiratory infection. He has a relatively benign exam today with only minimal posterior oropharyngeal erythema. Discussed Claritin for postnasal drip. Advised on saltwater gargles and honey for hoarseness. He is given return precautions.

## 2015-12-11 NOTE — Progress Notes (Signed)
Patient ID: Brian Bass, male   DOB: [birth year], 75 y.o.   MRN: [id number]  [name], MD Phone: [phone]  Brian Bass is a 75 y.o. male who presents today for same-day visit.  Patient notes 2 weeks ago he developed an upper respiratory infection. He had rhinorrhea and was blowing white material out of his nose. He then developed postnasal drip and hoarseness with minimal cough. He notes no sinus pressure or fever. Has not had any shortness of breath or chest pain. States he took a decongestant and cough medicine and this was beneficial. He notes he is not blowing anything out of his nose anymore. He is not having rhinorrhea. His cough is much improved. He remains minimally hoarse and that is most bothersome to him.  PMH: nonsmoker.   ROS see history of present illness  Objective  Physical Exam Filed Vitals:   12/11/15 1010  BP: 134/82  Pulse: 90  Temp: 97.7 F (36.5 C)    BP Readings from Last 3 Encounters:  12/11/15 134/82  10/16/15 116/64  07/14/15 122/68   Wt Readings from Last 3 Encounters:  12/11/15 174 lb 9.6 oz (79.198 kg)  11/24/15 175 lb 12.8 oz (79.742 kg)  10/16/15 174 lb 4 oz (79.039 kg)    Physical Exam  Constitutional: He is well-developed, well-nourished, and in no distress.  HENT:  Head: Normocephalic and atraumatic.  Right Ear: External ear normal.  Left Ear: External ear normal.  Minimal posterior oropharyngeal erythema, no exudate, no tonsillar swelling  Eyes: Conjunctivae are normal. Pupils are equal, round, and reactive to light.  Neck: Neck supple.  Cardiovascular: Normal rate, regular rhythm and normal heart sounds.  Exam reveals no gallop and no friction rub.   No murmur heard. Pulmonary/Chest: Effort normal and breath sounds normal. No respiratory distress. He has no wheezes. He has no rales.  Lymphadenopathy:    He has no cervical adenopathy.  Neurological: He is alert. Gait normal.  Skin: Skin is warm and dry. He is  not diaphoretic.     Assessment/Plan: Please see individual problem list.  Viral upper respiratory infection Patient's symptoms most consistent with resolving viral upper respiratory infection. He has a relatively benign exam today with only minimal posterior oropharyngeal erythema. Discussed Claritin for postnasal drip. Advised on saltwater gargles and honey for hoarseness. He is given return precautions.   Dragon [employer] forces training and education officer was used during the dictation process of this note. If any phrases or words seem inappropriate it is likely secondary to the translation process being inefficient.  [name]

## 2015-12-11 NOTE — Progress Notes (Signed)
Pre visit review using our clinic review tool, if applicable. No additional management support is needed unless otherwise documented below in the visit note.

## 2016-01-04 ENCOUNTER — Other Ambulatory Visit: Payer: Self-pay | Admitting: Family Medicine

## 2016-01-11 ENCOUNTER — Encounter: Payer: Self-pay | Admitting: Internal Medicine

## 2016-01-11 ENCOUNTER — Ambulatory Visit (AMBULATORY_SURGERY_CENTER): Payer: Commercial Managed Care - HMO | Admitting: Internal Medicine

## 2016-01-11 VITALS — BP 127/55 | HR 70 | Temp 96.8°F | Resp 30 | Ht 67.0 in | Wt 174.0 lb

## 2016-01-11 DIAGNOSIS — D123 Benign neoplasm of transverse colon: Secondary | ICD-10-CM

## 2016-01-11 DIAGNOSIS — Z86010 Personal history of colonic polyps: Secondary | ICD-10-CM

## 2016-01-11 DIAGNOSIS — I2510 Atherosclerotic heart disease of native coronary artery without angina pectoris: Secondary | ICD-10-CM | POA: Diagnosis not present

## 2016-01-11 DIAGNOSIS — D122 Benign neoplasm of ascending colon: Secondary | ICD-10-CM | POA: Diagnosis not present

## 2016-01-11 DIAGNOSIS — D125 Benign neoplasm of sigmoid colon: Secondary | ICD-10-CM | POA: Diagnosis not present

## 2016-01-11 DIAGNOSIS — Z800 Family history of malignant neoplasm of digestive organs: Secondary | ICD-10-CM | POA: Diagnosis not present

## 2016-01-11 DIAGNOSIS — E119 Type 2 diabetes mellitus without complications: Secondary | ICD-10-CM | POA: Diagnosis not present

## 2016-01-11 MED ORDER — SODIUM CHLORIDE 0.9 % IV SOLN: 500.0000 mL | INTRAVENOUS | Status: DC

## 2016-01-11 NOTE — Progress Notes (Signed)
A/ox3, pleased with MAC, report to RN 

## 2016-01-11 NOTE — Op Note (Signed)
[place]  [address] [place], [id number]   COLONOSCOPY PROCEDURE REPORT  PATIENT: Brian Bass, Brian Bass  MR#: [id number] BIRTHDATE: 10-02-1941 , 74  yrs. old GENDER: male ENDOSCOPIST: Jerene Bears, MD PROCEDURE DATE:  01/11/2016 PROCEDURE:   Colonoscopy, surveillance , Colonoscopy with snare polypectomy, and Colonoscopy with cold biopsy polypectomy First Screening Colonoscopy - Avg.  risk and is 50 yrs.  old or older - No.  Prior Negative Screening - Now for repeat screening. N/A  History of Adenoma - Now for follow-up colonoscopy & has been > or = to 3 yrs.  Yes hx of adenoma.  Has been 3 or more years since last colonoscopy.  Polyps removed today? Yes ASA CLASS:   Class III INDICATIONS:Surveillance due to prior colonic neoplasia, PH Colon Adenoma, and last colonoscopy 2011 [facility], [place]). MEDICATIONS: Monitored anesthesia care and Propofol 300 mg IV  DESCRIPTION OF PROCEDURE:   After the risks benefits and alternatives of the procedure were thoroughly explained, informed consent was obtained.  The digital rectal exam revealed no rectal mass.   The LB CF-H180AL Loaner [id number]  endoscope was introduced through the anus and advanced to the cecum, which was identified by both the appendix and ileocecal valve. No adverse events experienced.   The quality of the prep was good.  (Suprep was used) The instrument was then slowly withdrawn as the colon was fully examined. Estimated blood loss is zero unless otherwise noted in this procedure report.   COLON FINDINGS: Four sessile polyps ranging from 2 to 43m in size were found in the ascending colon.  Polypectomies were performed with cold forceps (2) and with a cold snare (2).  The resection was complete, the polyp tissue was completely retrieved and sent to histology.   Two sessile polyps ranging between 3-57min size were found in the transverse colon.  Polypectomies were performed with a cold snare.  The  resection was complete, the polyp tissue was completely retrieved and sent to histology.   A sessile polyp measuring 5 mm in size was found in the sigmoid colon.  A polypectomy was performed with a cold snare.  The resection was complete, the polyp tissue was completely retrieved and sent to histology.   There was mild diverticulosis noted at the hepatic flexure and in the sigmoid colon.  Retroflexed views revealed internal hemorrhoids. The time to cecum = 0.4 Withdrawal time = 18.1   The scope was withdrawn and the procedure completed.  COMPLICATIONS: There were no immediate complications.  ENDOSCOPIC IMPRESSION: 1.   Four sessile polyps ranging from 2 to 62m47mn size were found in the ascending colon; polypectomies were performed with cold forceps and with a cold snare 2.   Two sessile polyps ranging between 3-5mm33m size were found in the transverse colon; polypectomies were performed with a cold snare 3.   Sessile polyp was found in the sigmoid colon; polypectomy was performed with a cold snare 4.   Mild diverticulosis was noted at the hepatic flexure and in the sigmoid colon  RECOMMENDATIONS: 1.  Await pathology results 2.  Timing of repeat colonoscopy will be determined by pathology findings. 3.  You will receive a letter within 1-2 weeks with the results of your biopsy as well as final recommendations.  Please call my office if you have not received a letter after 3 weeks.  eSigned:  Quantay Zaremba Jerene Bears 01/11/2016 12:02 PM   cc:  the patient, Dr. BedsDiona BrownerATIENT NAME:  Brian Bass, Brian Bass MR#: [id number]

## 2016-01-11 NOTE — Patient Instructions (Signed)
YOU HAD AN ENDOSCOPIC PROCEDURE TODAY AT [facility]:   Refer to the procedure report that was given to you for any specific questions about what was found during the examination.  If the procedure report does not answer your questions, please call your gastroenterologist to clarify.  If you requested that your care partner not be given the details of your procedure findings, then the procedure report has been included in a sealed envelope for you to review at your convenience later.  YOU SHOULD EXPECT: Some feelings of bloating in the abdomen. Passage of more gas than usual.  Walking can help get rid of the air that was put into your GI tract during the procedure and reduce the bloating. If you had a lower endoscopy (such as a colonoscopy or flexible sigmoidoscopy) you may notice spotting of blood in your stool or on the toilet paper. If you underwent a bowel prep for your procedure, you may not have a normal bowel movement for a few days.  Please Note:  You might notice some irritation and congestion in your nose or some drainage.  This is from the oxygen used during your procedure.  There is no need for concern and it should clear up in a day or so.  SYMPTOMS TO REPORT IMMEDIATELY:   Following lower endoscopy (colonoscopy or flexible sigmoidoscopy):  Excessive amounts of blood in the stool  Significant tenderness or worsening of abdominal pains  Swelling of the abdomen that is new, acute  Fever of 100F or higher    For urgent or emergent issues, a gastroenterologist can be reached at any hour by calling [phone].   DIET: Your first meal following the procedure should be a small meal and then it is ok to progress to your normal diet. Heavy or fried foods are harder to digest and may make you feel nauseous or bloated.  Likewise, meals heavy in dairy and vegetables can increase bloating.  Drink plenty of fluids but you should avoid alcoholic beverages for 24  hours.  ACTIVITY:  You should plan to take it easy for the rest of today and you should NOT DRIVE or use heavy machinery until tomorrow (because of the sedation medicines used during the test).    FOLLOW UP: Our staff will call the number listed on your records the next business day following your procedure to check on you and address any questions or concerns that you may have regarding the information given to you following your procedure. If we do not reach you, we will leave a message.  However, if you are feeling well and you are not experiencing any problems, there is no need to return our call.  We will assume that you have returned to your regular daily activities without incident.  If any biopsies were taken you will be contacted by phone or by letter within the next 1-3 weeks.  Please call us at [phone] if you have not heard about the biopsies in 3 weeks.    SIGNATURES/CONFIDENTIALITY: You and/or your care partner have signed paperwork which will be entered into your electronic medical record.  These signatures attest to the fact that that the information above on your After Visit Summary has been reviewed and is understood.  Full responsibility of the confidentiality of this discharge information lies with you and/or your care-partner.  Polyps and diverticulosis information given  .

## 2016-01-11 NOTE — Progress Notes (Signed)
Called to room to assist during endoscopic procedure.  Patient ID and intended procedure confirmed with present staff. Received instructions for my participation in the procedure from the performing physician.

## 2016-01-12 ENCOUNTER — Telehealth: Payer: Self-pay | Admitting: *Deleted

## 2016-01-12 NOTE — Telephone Encounter (Signed)
  Follow up Call-  Call back number 01/11/2016  Post procedure Call Back phone  # [phone]  Permission to leave phone message Yes     Patient questions:  Do you have a fever, pain , or abdominal swelling? No. Pain Score  0 *  Have you tolerated food without any problems? Yes.    Have you been able to return to your normal activities? Yes.    Do you have any questions about your discharge instructions: Diet   No. Medications  No. Follow up visit  No.  Do you have questions or concerns about your Care? No.  Actions: * If pain score is 4 or above: No action needed, pain <4.

## 2016-01-14 ENCOUNTER — Encounter: Payer: Self-pay | Admitting: Internal Medicine

## 2016-01-14 ENCOUNTER — Telehealth: Payer: Self-pay | Admitting: Family Medicine

## 2016-01-14 ENCOUNTER — Other Ambulatory Visit (INDEPENDENT_AMBULATORY_CARE_PROVIDER_SITE_OTHER): Payer: Commercial Managed Care - HMO

## 2016-01-14 DIAGNOSIS — E1165 Type 2 diabetes mellitus with hyperglycemia: Principal | ICD-10-CM

## 2016-01-14 DIAGNOSIS — IMO0001 Reserved for inherently not codable concepts without codable children: Secondary | ICD-10-CM

## 2016-01-14 LAB — LIPID PANEL
CHOLESTEROL: 146 mg/dL (ref 0–200)
HDL: 32.6 mg/dL — ABNORMAL LOW (ref >39.00)
NonHDL: 113.51
Total CHOL/HDL Ratio: 4
Triglycerides: 308 mg/dL — ABNORMAL HIGH (ref 0.0–149.0)
VLDL: 61.6 mg/dL — AB (ref 0.0–40.0)

## 2016-01-14 LAB — COMPREHENSIVE METABOLIC PANEL
ALT: 30 U/L (ref 0–53)
AST: 22 U/L (ref 0–37)
Albumin: 4.3 g/dL (ref 3.5–5.2)
Alkaline Phosphatase: 59 U/L (ref 39–117)
BUN: 20 mg/dL (ref 6–23)
CALCIUM: 9.8 mg/dL (ref 8.4–10.5)
CHLORIDE: 102 meq/L (ref 96–112)
CO2: 27 meq/L (ref 19–32)
CREATININE: 1.29 mg/dL (ref 0.40–1.50)
GFR: 57.62 mL/min — ABNORMAL LOW (ref >60.00)
Glucose, Bld: 196 mg/dL — ABNORMAL HIGH (ref 70–99)
Potassium: 4.3 mEq/L (ref 3.5–5.1)
SODIUM: 136 meq/L (ref 135–145)
Total Bilirubin: 0.5 mg/dL (ref 0.2–1.2)
Total Protein: 7.4 g/dL (ref 6.0–8.3)

## 2016-01-14 LAB — HEMOGLOBIN A1C: Hgb A1c MFr Bld: 9.2 % — ABNORMAL HIGH (ref 4.6–6.5)

## 2016-01-14 LAB — LDL CHOLESTEROL, DIRECT: Direct LDL: 70 mg/dL

## 2016-01-14 NOTE — Telephone Encounter (Signed)
-----  Message from Marchia Bond sent at 01/07/2016  1:21 PM EST ----- Regarding: Dm f/u labs Thurs 2/9, need orders. Thanks! :-) Please order future dm f/u labs for pt's upcoming lab appt. Thanks Aniceto Boss

## 2016-01-19 ENCOUNTER — Encounter: Payer: Self-pay | Admitting: Family Medicine

## 2016-01-19 ENCOUNTER — Ambulatory Visit (INDEPENDENT_AMBULATORY_CARE_PROVIDER_SITE_OTHER): Payer: Commercial Managed Care - HMO | Admitting: Family Medicine

## 2016-01-19 VITALS — BP 128/80 | HR 79 | Temp 98.0°F | Ht 67.0 in | Wt 177.2 lb

## 2016-01-19 DIAGNOSIS — E1165 Type 2 diabetes mellitus with hyperglycemia: Secondary | ICD-10-CM | POA: Diagnosis not present

## 2016-01-19 DIAGNOSIS — I2510 Atherosclerotic heart disease of native coronary artery without angina pectoris: Secondary | ICD-10-CM | POA: Diagnosis not present

## 2016-01-19 DIAGNOSIS — IMO0001 Reserved for inherently not codable concepts without codable children: Secondary | ICD-10-CM

## 2016-01-19 DIAGNOSIS — E7800 Pure hypercholesterolemia, unspecified: Secondary | ICD-10-CM

## 2016-01-19 DIAGNOSIS — I10 Essential (primary) hypertension: Secondary | ICD-10-CM | POA: Diagnosis not present

## 2016-01-19 LAB — HM DIABETES FOOT EXAM

## 2016-01-19 NOTE — Patient Instructions (Addendum)
Decrease biscuits, potato, ice cream. Eat basically protein, veggies and limit carbohydrates but only eat high fiber carbs. Take Lantus  90 Units every day. Goal 80-120 fasting, 2 hours after meals goal < 180. Call or email blood sugar levels fasting and 2 hours after meals. in 1 month.

## 2016-01-19 NOTE — Progress Notes (Signed)
75 year old male presents for DM follow up 3 months.  Hypertension: Well controlled at goal off lisinopril  BP Readings from Last 3 Encounters:  01/19/16 128/80  01/11/16 127/55  12/11/15 134/82   Using medication without problems or lightheadedness: None Chest pain with exertion:None  Edema:None  Short of breath:None  Average home BPs: 120/70  Other issues:  Elevated Cholesterol: LDL at goal , trigs high on simvastatin 40 mg daily Lab Results  Component Value Date   CHOL 146 01/14/2016   HDL 32.60* 01/14/2016   LDLCALC 51 06/17/2014   LDLDIRECT 70.0 01/14/2016   TRIG 308.0* 01/14/2016   CHOLHDL 4 01/14/2016   Using medications without problems: None Muscle aches: None Diet compliance: Poor eating habits, not sticking to DM diet. Exercise: Walking 1 mile a day Other complaints: CAD   Diabetes: Inadequate control on current dose on insulin 80-85 occ 90 units daily, metformin (not using dose as prescribed given it gives him diarrhea) , glucotrol max. He was unable to afford the invokana. Never started it.[name] He is having trouble affording insulin, trying to make it stretch. Lab Results  Component Value Date   HGBA1C 9.2* 01/14/2016  Using medications without difficulties:  Hypoglycemic episodes: none  Hyperglycemic episodes: rarely Feet problems:None  Blood Sugars averaging:Checking 2 times a day, [id number], 2 hours pp: 200-250 eye exam within last year: Yes  Poor eating habits, not sticking to DM diet. Was trying to really well in last month.              HX of CAD.[name] Social History /Family History/Past Medical History reviewed and updated if needed.   Review of Systems  Constitutional: Negative for fever and fatigue.  HENT: Negative for ear pain.  Eyes: Negative for pain.  Respiratory: Negative for cough and shortness of breath.  Cardiovascular: Negative for chest pain and leg swelling.  Gastrointestinal: Negative for abdominal  pain.  Genitourinary: Negative for dysuria.  Musculoskeletal: Negative for arthralgias.  Neurological: Negative for syncope and headaches.  Psychiatric/Behavioral: Negative for dysphoric mood.       Objective:   Physical Exam  Constitutional: Vital signs are normal. He appears well-developed and well-nourished.  HENT:  Head: Normocephalic.  Right Ear: Hearing normal.  Left Ear: Hearing normal.  Nose: Nose normal.  Mouth/Throat: Oropharynx is clear and moist and mucous membranes are normal.  Neck: Trachea normal. Carotid bruit is not present. No thyroid mass and no thyromegaly present.  Cardiovascular: Normal rate, regular rhythm and normal pulses. Exam reveals no gallop, no distant heart sounds and no friction rub.  No murmur heard. No peripheral edema  Pulmonary/Chest: Effort normal and breath sounds normal. No respiratory distress.  Skin: Skin is warm, dry and intact. No rash noted.  Psychiatric: He has a normal mood and affect. His speech is normal and behavior is normal. Thought content normal.    Diabetic foot exam: Normal inspection No skin breakdown No calluses  Normal DP pulses Normal sensation to light touch and monofilament Nails long.

## 2016-01-19 NOTE — Assessment & Plan Note (Signed)
MAke sure to take lantus consistently 90 units daily or at least decrease carbs in diet. Spent considerable time counsleing on diet changes. Info given. Pt not interested in cahnge to 70/30 for cost or addition of another oral med.

## 2016-01-19 NOTE — Assessment & Plan Note (Signed)
Trigs high.. Lower with decreasing carbs. LDL at goal. Encouraged exercise, weight loss, healthy eating habits.

## 2016-01-19 NOTE — Progress Notes (Signed)
Pre visit review using our clinic review tool, if applicable. No additional management support is needed unless otherwise documented below in the visit note.

## 2016-01-19 NOTE — Assessment & Plan Note (Signed)
Asymptomatic. 

## 2016-01-19 NOTE — Assessment & Plan Note (Signed)
Well controlled. Continue current medication.

## 2016-01-20 ENCOUNTER — Telehealth: Payer: Self-pay | Admitting: *Deleted

## 2016-01-20 ENCOUNTER — Other Ambulatory Visit: Payer: Self-pay | Admitting: Family Medicine

## 2016-01-20 MED ORDER — CANAGLIFLOZIN 100 MG PO TABS: 100.0000 mg | ORAL_TABLET | Freq: Every day | ORAL | Status: DC

## 2016-01-20 NOTE — Telephone Encounter (Signed)
Spoke with Mrs. Herrero and advised her the medication Dr. Diona Browner had wanted Brian Bass to try was Invokana 100 mg daily.  She called [facility] and can get that medication for $47.  She ask that we send in Rx to [facility].  She also ask if this changes any of his other diabetic medications and I advised no that the Invokana would be in addition to.

## 2016-01-20 NOTE — Telephone Encounter (Signed)
Brian Bass  Pt spouse called wanting to only talk to [employer] number [phone]

## 2016-01-20 NOTE — Telephone Encounter (Signed)
Patient was advised at ov yesterday to call and check on the cost of a new diabetic medication.  He cannot recall what the name of this was.  Please advise.

## 2016-01-21 NOTE — Telephone Encounter (Signed)
Please send in rx as requested to [facility].

## 2016-03-05 ENCOUNTER — Other Ambulatory Visit: Payer: Self-pay | Admitting: Family Medicine

## 2016-04-29 ENCOUNTER — Telehealth: Payer: Self-pay | Admitting: Family Medicine

## 2016-04-29 NOTE — Telephone Encounter (Signed)
Pt left v/m requesting cb about refills; left v/m requesting cb.

## 2016-04-29 NOTE — Telephone Encounter (Signed)
Patient's wife,Brian Bass,called. Brian Bass is asking for refills.  She said she had spoken to [name] before about the refills and asked for [name] to return her call.

## 2016-05-03 MED ORDER — INSULIN GLARGINE 100 UNIT/ML ~~LOC~~ SOLN: SUBCUTANEOUS | Status: DC

## 2016-05-03 NOTE — Telephone Encounter (Signed)
Left message for [name] to return my call. 

## 2016-05-03 NOTE — Telephone Encounter (Signed)
Spoke with Brian Bass.  Brian Bass needs refill on his Lantus sent into [facility].  Refill sent as requested.

## 2016-06-25 ENCOUNTER — Other Ambulatory Visit: Payer: Self-pay | Admitting: Family Medicine

## 2016-06-27 ENCOUNTER — Telehealth: Payer: Self-pay | Admitting: Family Medicine

## 2016-06-27 MED ORDER — METFORMIN HCL 500 MG PO TABS: 500.0000 mg | ORAL_TABLET | Freq: Two times a day (BID) | ORAL | 1 refills | Status: DC

## 2016-06-27 NOTE — Telephone Encounter (Signed)
Metformin refill sent to [facility] as requested.

## 2016-06-27 NOTE — Telephone Encounter (Signed)
Opened in error °

## 2016-06-27 NOTE — Telephone Encounter (Signed)
Spouse called requesting metFORMIN (GLUCOPHAGE) 500 MG tablet  Refill.  Pt was previously on [facility], but only refilled twice due due expense (could not afford).  Pt is out of the metFORMIN and would like refill sent to [facility].  Pt is requesting callback to [phone]

## 2016-06-27 NOTE — Addendum Note (Signed)
Addended by: Carter Kitten on: 06/27/2016 11:51 AM   Modules accepted: Orders

## 2016-07-05 LAB — HM DIABETES EYE EXAM

## 2016-07-07 DIAGNOSIS — H524 Presbyopia: Secondary | ICD-10-CM | POA: Diagnosis not present

## 2016-07-07 DIAGNOSIS — H5210 Myopia, unspecified eye: Secondary | ICD-10-CM | POA: Diagnosis not present

## 2016-07-14 ENCOUNTER — Other Ambulatory Visit: Payer: Commercial Managed Care - HMO

## 2016-07-14 ENCOUNTER — Other Ambulatory Visit (INDEPENDENT_AMBULATORY_CARE_PROVIDER_SITE_OTHER): Payer: Commercial Managed Care - HMO

## 2016-07-14 ENCOUNTER — Ambulatory Visit (INDEPENDENT_AMBULATORY_CARE_PROVIDER_SITE_OTHER): Payer: Commercial Managed Care - HMO

## 2016-07-14 ENCOUNTER — Telehealth: Payer: Self-pay | Admitting: Family Medicine

## 2016-07-14 VITALS — BP 128/76 | HR 74 | Temp 97.8°F | Ht 66.5 in | Wt 165.0 lb

## 2016-07-14 DIAGNOSIS — E1165 Type 2 diabetes mellitus with hyperglycemia: Secondary | ICD-10-CM

## 2016-07-14 DIAGNOSIS — E7800 Pure hypercholesterolemia, unspecified: Secondary | ICD-10-CM | POA: Diagnosis not present

## 2016-07-14 DIAGNOSIS — IMO0001 Reserved for inherently not codable concepts without codable children: Secondary | ICD-10-CM

## 2016-07-14 DIAGNOSIS — Z0000 Encounter for general adult medical examination without abnormal findings: Secondary | ICD-10-CM

## 2016-07-14 DIAGNOSIS — M1049 Other secondary gout, multiple sites: Secondary | ICD-10-CM | POA: Diagnosis not present

## 2016-07-14 DIAGNOSIS — Z794 Long term (current) use of insulin: Secondary | ICD-10-CM | POA: Diagnosis not present

## 2016-07-14 LAB — LIPID PANEL
CHOL/HDL RATIO: 4
Cholesterol: 138 mg/dL (ref 0–200)
HDL: 33.7 mg/dL — ABNORMAL LOW (ref >39.00)
LDL CALC: 67 mg/dL (ref 0–99)
NONHDL: 103.87
Triglycerides: 182 mg/dL — ABNORMAL HIGH (ref 0.0–149.0)
VLDL: 36.4 mg/dL (ref 0.0–40.0)

## 2016-07-14 LAB — COMPREHENSIVE METABOLIC PANEL
ALBUMIN: 4.4 g/dL (ref 3.5–5.2)
ALT: 19 U/L (ref 0–53)
AST: 19 U/L (ref 0–37)
Alkaline Phosphatase: 59 U/L (ref 39–117)
BUN: 11 mg/dL (ref 6–23)
CHLORIDE: 102 meq/L (ref 96–112)
CO2: 29 meq/L (ref 19–32)
CREATININE: 1.13 mg/dL (ref 0.40–1.50)
Calcium: 9.7 mg/dL (ref 8.4–10.5)
GFR: 67.05 mL/min (ref >60.00)
Glucose, Bld: 195 mg/dL — ABNORMAL HIGH (ref 70–99)
Potassium: 4.8 mEq/L (ref 3.5–5.1)
SODIUM: 136 meq/L (ref 135–145)
Total Bilirubin: 0.7 mg/dL (ref 0.2–1.2)
Total Protein: 7.4 g/dL (ref 6.0–8.3)

## 2016-07-14 LAB — URIC ACID: URIC ACID, SERUM: 7.1 mg/dL (ref 4.0–7.8)

## 2016-07-14 LAB — HEMOGLOBIN A1C: HEMOGLOBIN A1C: 8.3 % — AB (ref 4.6–6.5)

## 2016-07-14 LAB — MICROALBUMIN / CREATININE URINE RATIO
CREATININE, U: 86 mg/dL
Microalb Creat Ratio: 0.8 mg/g (ref 0.0–30.0)

## 2016-07-14 NOTE — Progress Notes (Signed)
I reviewed health advisor's note, was available for consultation, and agree with documentation and plan.  [name], MD [place] at [facility]

## 2016-07-14 NOTE — Telephone Encounter (Signed)
-----  Message from Ellamae Sia sent at 07/08/2016  9:24 AM EDT ----- Regarding: Lab orders for Thursday, 8.10.17  AWV lab orders, please.

## 2016-07-14 NOTE — Progress Notes (Signed)
Pre visit review using our clinic review tool, if applicable. No additional management support is needed unless otherwise documented below in the visit note.

## 2016-07-14 NOTE — Progress Notes (Signed)
Subjective:   Brian Bass is a 75 y.o. male who presents for Medicare Annual/Subsequent preventive examination.  Review of Systems:  N/A Cardiac Risk Factors include: advanced age (>70mn, >>20women);male gender;diabetes mellitus;dyslipidemia;hypertension     Objective:    Vitals: BP 128/76 (BP Location: Right Arm, Patient Position: Sitting, Cuff Size: Normal)   Pulse 74   Temp 97.8 F (36.6 C) (Oral)   Ht 5' 6.5" (1.689 m)   Wt 165 lb (74.8 kg)   SpO2 98%   BMI 26.23 kg/m   Body mass index is 26.23 kg/m.  Tobacco History  Smoking Status  . Never Smoker  Smokeless Tobacco  . Never Used     Counseling given: No   Past Medical History:  Diagnosis Date  . Adenomatous colon polyp   . Complication of anesthesia    Per pt, hard to wake up past last colon in 2011!  . Diabetes mellitus   . Esophageal stricture   . GERD (gastroesophageal reflux disease)   . History of colon polyps 922/2011  . Hyperlipidemia   . IBS (irritable bowel syndrome)    Past Surgical History:  Procedure Laterality Date  . CORONARY ARTERY BYPASS GRAFT  10-2010   3 vessels   Family History  Problem Relation Age of Onset  . Heart failure Mother   . Aneurysm Father   . Alcohol abuse Brother   . Colon cancer Cousin    History  Sexual Activity  . Sexual activity: No    Outpatient Encounter Prescriptions as of 07/14/2016  Medication Sig  . ACCU-CHEK FASTCLIX LANCETS MISC 1 Device by Other route 3 (three) times daily. Check blood sugars 2 to 3 times daily.  Dx: 250.02  . ACCU-CHEK SMARTVIEW test strip USE TO CHECK BLOOD SUGARS 2 TO 3 TIMES DAILY.   .Marland Kitchenaspirin 81 MG tablet Take 81 mg by mouth daily.  . BD INSULIN SYRINGE ULTRAFINE 31G X 5/16" 1 ML MISC USE AS DIRECTED  . Blood Glucose Monitoring Suppl (ACCU-CHEK NANO SMARTVIEW) W/DEVICE KIT 1 kit by Other route 3 (three) times daily. Use to check blood sugars 2 to 3 times daily.  Dx: 250.02  . glipiZIDE (GLUCOTROL) 10 MG tablet TAKE 1  TABLET (10 MG TOTAL) BY MOUTH 2 (TWO) TIMES DAILY BEFORE A MEAL.  .Marland Kitcheninsulin glargine (LANTUS) 100 UNIT/ML injection INJECT 0.55 MLS (55 UNITS TOTAL) SUBCUTANEOUSLY 2 TIMES DAILY.  . metFORMIN (GLUCOPHAGE) 500 MG tablet Take 1 tablet (500 mg total) by mouth 2 (two) times daily with a meal.  . Multiple Vitamin (MULTIVITAMIN) tablet Take 1 tablet by mouth daily.    . Omega-3 Fatty Acids (FISH OIL) 1200 MG CAPS Take 1 capsule by mouth daily.  .Marland Kitchenomeprazole (PRILOSEC) 20 MG capsule TAKE 1 CAPSULE EVERY DAY  . simvastatin (ZOCOR) 40 MG tablet TAKE 1 TABLET AT BEDTIME  . [DISCONTINUED] canagliflozin (INVOKANA) 100 MG TABS tablet Take 1 tablet (100 mg total) by mouth daily before breakfast.  . [DISCONTINUED] finasteride (PROSCAR) 5 MG tablet Take 5 mg by mouth daily. Reported on 11/24/2015  . [DISCONTINUED] tamsulosin (FLOMAX) 0.4 MG CAPS capsule Take 0.4 mg by mouth. Reported on 01/11/2016   No facility-administered encounter medications on file as of 07/14/2016.     Activities of Daily Living In your present state of health, do you have any difficulty performing the following activities: 07/14/2016  Hearing? N  Vision? N  Difficulty concentrating or making decisions? N  Walking or climbing stairs? N  Dressing or  bathing? N  Doing errands, shopping? N  Preparing Food and eating ? N  Using the Toilet? N  In the past six months, have you accidently leaked urine? N  Do you have problems with loss of bowel control? N  Managing your Medications? N  Managing your Finances? N  Housekeeping or managing your Housekeeping? N  Some recent data might be hidden    Patient Care Team: Jinny Sanders, MD as PCP - General   Assessment:     Hearing Screening   _0  _1  _2  _3  _4  _5  _6  _7  _8   Right ear:   0 0 40  40    Left ear:   0 0 40  40    Vision Screening Comments: Last vision exam at [place] in 07/2016   Exercise Activities and Dietary recommendations Current  Exercise Habits: Home exercise routine, Type of exercise: walking, Frequency (Times/Week): 7, Intensity: Moderate, Exercise limited by: None identified  Goals    . Increase physical activity          Starting 07/14/2016, I will continue to walk at least 30-45 min daily as weather permits.       Fall Risk Fall Risk  07/14/2016 07/14/2015 06/20/2014 11/22/2012  Falls in the past year? No No No No   Depression Screen PHQ 2/9 Scores 07/14/2016 07/14/2015 06/20/2014 11/22/2012  PHQ - 2 Score 0 0 0 0    Cognitive Testing MMSE - Mini Mental State Exam 07/14/2016  Orientation to time 5  Orientation to Place 5  Registration 3  Attention/ Calculation 0  Recall 3  Language- name 2 objects 0  Language- repeat 1  Language- follow 3 step command 3  Language- read & follow direction 0  Write a sentence 0  Copy design 0  Total score 20   PLEASE NOTE: A Mini-Cog screen was completed. Maximum score is 20. A value of 0 denotes this part of Folstein MMSE was not completed or the patient failed this part of the Mini-Cog screening.   Mini-Cog Screening Orientation to Time - Max 5 pts Orientation to Place - Max 5 pts Registration - Max 3 pts Recall - Max 3 pts Language Repeat - Max 1 pts Language Follow 3 Step Command - Max 3 pts  Immunization History  Administered Date(s) Administered  . Influenza Split 10/05/2011, 10/11/2012  . Influenza Whole 11/12/2010  . Influenza,inj,Quad PF,36+ Mos 09/06/2013, 09/24/2014, 10/02/2015  . Pneumococcal Polysaccharide-23 12/06/2007  . Td 08/10/2010   Screening Tests Health Maintenance  Topic Date Due  . INFLUENZA VACCINE  12/04/2016 (Originally 07/05/2016)  . ZOSTAVAX  01/18/2017 (Originally 06/23/2000)  . PNA vac Low Risk Adult (2 of 2 - PCV13) 01/18/2017 (Originally 12/05/2008)  . HEMOGLOBIN A1C  01/14/2017  . FOOT EXAM  01/18/2017  . OPHTHALMOLOGY EXAM  07/05/2017  . URINE MICROALBUMIN  07/14/2017  . COLONOSCOPY  01/10/2019  . TETANUS/TDAP  08/10/2020       Plan:     I have personally reviewed and addressed the Medicare Annual Wellness questionnaire and have noted the following in the patient's chart:  A. Medical and social history B. Use of alcohol, tobacco or illicit drugs  C. Current medications and supplements D. Functional ability and status E.  Nutritional status F.  Physical activity G. Advance directives H. List of other physicians I.  Hospitalizations, surgeries, and ER visits in previous 12 months J.  Zachary to include hearing, vision, cognitive, depression L. Referrals and appointments - none  In addition, I have reviewed and discussed with patient certain preventive protocols, quality metrics, and best practice recommendations. A written personalized care plan for preventive services as well as general preventive health recommendations were provided to patient.  See attached scanned questionnaire for additional information.   Signed,   Lindell Noe, MHA, BS, LPN Health Advisor

## 2016-07-14 NOTE — Patient Instructions (Addendum)
Brian Bass , Thank you for taking time to come for your Medicare Wellness Visit. I appreciate your ongoing commitment to your health goals. Please review the following plan we discussed and let me know if I can assist you in the future.   These are the goals we discussed: Goals    . Increase physical activity          Starting 07/14/2016, I will continue to walk at least 30-45 min daily as weather permits.        This is a list of the screening recommended for you and due dates:  Health Maintenance  Topic Date Due  . Flu Shot  12/04/2016*  . Shingles Vaccine  01/18/2017*  . Pneumonia vaccines (2 of 2 - PCV13) 01/18/2017*  . Hemoglobin A1C  01/14/2017  . Complete foot exam   01/18/2017  . Eye exam for diabetics  07/05/2017  . Urine Protein Check  07/14/2017  . Colon Cancer Screening  01/10/2019  . Tetanus Vaccine  08/10/2020  *Topic was postponed. The date shown is not the original due date.    Preventive Care for Adults  A healthy lifestyle and preventive care can promote health and wellness. Preventive health guidelines for adults include the following key practices.  . A routine yearly physical is a good way to check with your health care provider about your health and preventive screening. It is a chance to share any concerns and updates on your health and to receive a thorough exam.  . Visit your dentist for a routine exam and preventive care every 6 months. Brush your teeth twice a day and floss once a day. Good oral hygiene prevents tooth decay and gum disease.  . The frequency of eye exams is based on your age, health, family medical history, use  of contact lenses, and other factors. Follow your health care provider's ecommendations for frequency of eye exams.  . Eat a healthy diet. Foods like vegetables, fruits, whole grains, low-fat dairy products, and lean protein foods contain the nutrients you need without too many calories. Decrease your intake of foods high in  solid fats, added sugars, and salt. Eat the right amount of calories for you. Get information about a proper diet from your health care provider, if necessary.  . Regular physical exercise is one of the most important things you can do for your health. Most adults should get at least 150 minutes of moderate-intensity exercise (any activity that increases your heart rate and causes you to sweat) each week. In addition, most adults need muscle-strengthening exercises on 2 or more days a week.  Silver Sneakers may be a benefit available to you. To determine eligibility, you may visit the website: www.silversneakers.com or contact program at [phone]) [phone] Mon-Fri between 8AM-8PM.   . Maintain a healthy weight. The body mass index (BMI) is a screening tool to identify possible weight problems. It provides an estimate of body fat based on height and weight. Your health care provider can find your BMI and can help you achieve or maintain a healthy weight.   For adults 20 years and older: ? A BMI below 18.5 is considered underweight. ? A BMI of 18.5 to 24.9 is normal. ? A BMI of 25 to 29.9 is considered overweight. ? A BMI of 30 and above is considered obese.   . Maintain normal blood lipids and cholesterol levels by exercising and minimizing your intake of saturated fat. Eat a balanced diet with plenty of fruit and  vegetables. Blood tests for lipids and cholesterol should begin at age 34 and be repeated every 5 years. If your lipid or cholesterol levels are high, you are over 50, or you are at high risk for heart disease, you may need your cholesterol levels checked more frequently. Ongoing high lipid and cholesterol levels should be treated with medicines if diet and exercise are not working.  . If you smoke, find out from your health care provider how to quit. If you do not use tobacco, please do not start.  . If you choose to drink alcohol, please do not consume more than 2 drinks per day. One drink  is considered to be 12 ounces (355 mL) of beer, 5 ounces (148 mL) of wine, or 1.5 ounces (44 mL) of liquor.  . If you are 55-46 years old, ask your health care provider if you should take aspirin to prevent strokes.  . Use sunscreen. Apply sunscreen liberally and repeatedly throughout the day. You should seek shade when your shadow is shorter than you. Protect yourself by wearing long sleeves, pants, a wide-brimmed hat, and sunglasses year round, whenever you are outdoors.  . Once a month, do a whole body skin exam, using a mirror to look at the skin on your back. Tell your health care provider of new moles, moles that have irregular borders, moles that are larger than a pencil eraser, or moles that have changed in shape or color.

## 2016-07-14 NOTE — Progress Notes (Signed)
PCP notes:   Health maintenance:  Flu - addressed Urine microlbumin - completed A1C - completed Eye exam - completed 07/05/16 per pt  Abnormal screenings:   Hearing - failed  Patient concerns:   None  Nurse concerns:  None  Next PCP appt:   07/19/16 @ 0915

## 2016-07-19 ENCOUNTER — Encounter: Payer: Self-pay | Admitting: Family Medicine

## 2016-07-19 ENCOUNTER — Ambulatory Visit (INDEPENDENT_AMBULATORY_CARE_PROVIDER_SITE_OTHER): Payer: Commercial Managed Care - HMO | Admitting: Family Medicine

## 2016-07-19 VITALS — BP 120/78 | Temp 97.6°F | Wt 167.0 lb

## 2016-07-19 DIAGNOSIS — IMO0001 Reserved for inherently not codable concepts without codable children: Secondary | ICD-10-CM

## 2016-07-19 DIAGNOSIS — E7800 Pure hypercholesterolemia, unspecified: Secondary | ICD-10-CM | POA: Diagnosis not present

## 2016-07-19 DIAGNOSIS — E1165 Type 2 diabetes mellitus with hyperglycemia: Secondary | ICD-10-CM

## 2016-07-19 DIAGNOSIS — I10 Essential (primary) hypertension: Secondary | ICD-10-CM

## 2016-07-19 DIAGNOSIS — N182 Chronic kidney disease, stage 2 (mild): Secondary | ICD-10-CM | POA: Diagnosis not present

## 2016-07-19 DIAGNOSIS — R490 Dysphonia: Secondary | ICD-10-CM | POA: Insufficient documentation

## 2016-07-19 DIAGNOSIS — M1049 Other secondary gout, multiple sites: Secondary | ICD-10-CM

## 2016-07-19 LAB — HM DIABETES FOOT EXAM

## 2016-07-19 NOTE — Assessment & Plan Note (Addendum)
Well controlled on no med. Encouraged exercise, weight loss, healthy eating habits.

## 2016-07-19 NOTE — Assessment & Plan Note (Signed)
Stable control. Avoid nephrotoxic meds.

## 2016-07-19 NOTE — Assessment & Plan Note (Signed)
Likely secondar yt o allergies. Try xyzal.. If not better consider further eval.

## 2016-07-19 NOTE — Assessment & Plan Note (Signed)
LDL at goal on statin. Trig high.. Encouraged diet changes.

## 2016-07-19 NOTE — Assessment & Plan Note (Signed)
Stable control, no flares.

## 2016-07-19 NOTE — Patient Instructions (Signed)
Goal Blood sugar fasting < 120. Increase insulin to 90 Units daily. Continue other medication.

## 2016-07-19 NOTE — Progress Notes (Signed)
75 year old male presents for PART 2   Hypertension: Well controlled at goal off lisinopril      BP Readings from Last 3 Encounters:  07/19/16 120/78  07/14/16 128/76  01/19/16 128/80  Using medication without problems or lightheadedness: None Chest pain with exertion:None  Edema:None  Short of breath:None  Average home BPs: 120/70  Other issues:  Elevated Cholesterol: LDL at goal , trigs high on simvastatin 40 mg daily Lab Results  Component Value Date   CHOL 138 07/14/2016   HDL 33.70 (L) 07/14/2016   LDLCALC 67 07/14/2016   LDLDIRECT 70.0 01/14/2016   TRIG 182.0 (H) 07/14/2016   CHOLHDL 4 07/14/2016  Using medications without problems: None Muscle aches: None Diet compliance: Poor eating habits, not sticking to DM diet. Exercise: Walking 1 mile a day Other complaints: CAD     Has lost 10 lbs in last 6 months. Wt Readings from Last 3 Encounters:  07/19/16 167 lb (75.8 kg)  07/14/16 165 lb (74.8 kg)  01/19/16 177 lb 4 oz (80.4 kg)     Diabetes: Inadequate control on current dose on insulin 80 units daily, metformin (not using dose as prescribed given it gives him diarrhea) , glucotrol max. He was unable to afford the invokana. Never started it..  Lab Results  Component Value Date   HGBA1C 8.3 (H) 07/14/2016  Using medications without difficulties:  Hypoglycemic episodes: none  Hyperglycemic episodes: rarely Feet problems:None  Blood Sugars averaging:Checking 2 times a day, KPT465, 2 hours pp: not checking eye exam within last year: 07/05/2016 NML MICROALBUMIN on no ACEI.   CKD, due to DM and HTN, stable baseline 1.1to 1.29 NML MICROALBUMIN on no ACEI.   Gout , stable control : no flare in last year. Uric acid 7.1       He has been having some post nasal drip and hoarse ness and dry cough. DOes not feel ill. NO SOB.       HX of CAD.[name] Social History /Family History/Past Medical History reviewed and updated if  needed.   Review of Systems  Constitutional: Negative for fever and fatigue.  HENT: Negative for ear pain.  Eyes: Negative for pain.  Respiratory: Negative for cough and shortness of breath.  Cardiovascular: Negative for chest pain and leg swelling.  Gastrointestinal: Negative for abdominal pain.  Genitourinary: Negative for dysuria.  Musculoskeletal: Negative for arthralgias.  Neurological: Negative for syncope and headaches.  Psychiatric/Behavioral: Negative for dysphoric mood.       Objective:   Physical Exam  Constitutional: Vital signs are normal. He appears well-developed and well-nourished.  HENT:  Head: Normocephalic.  Right Ear: Hearing normal.  Left Ear: Hearing normal.  Nose: Nose normal.  Mouth/Throat: Oropharynx is clear and moist and mucous membranes are normal.  Neck: Trachea normal. Carotid bruit is not present. No thyroid mass and no thyromegaly present.  Cardiovascular: Normal rate, regular rhythm and normal pulses. Exam reveals no gallop, no distant heart sounds and no friction rub.  No murmur heard. No peripheral edema  Pulmonary/Chest: Effort normal and breath sounds normal. No respiratory distress.  Skin: Skin is warm, dry and intact. No rash noted.  Psychiatric: He has a normal mood and affect. His speech is normal and behavior is normal. Thought content normal.    Diabetic foot exam: Normal inspection No skin breakdown No calluses  Normal DP pulses Normal sensation to light touch and monofilament Nails long.

## 2016-09-13 ENCOUNTER — Ambulatory Visit (INDEPENDENT_AMBULATORY_CARE_PROVIDER_SITE_OTHER): Payer: Commercial Managed Care - HMO

## 2016-09-13 DIAGNOSIS — Z23 Encounter for immunization: Secondary | ICD-10-CM | POA: Diagnosis not present

## 2016-11-07 ENCOUNTER — Other Ambulatory Visit: Payer: Self-pay | Admitting: *Deleted

## 2016-11-07 MED ORDER — GLIPIZIDE 10 MG PO TABS: ORAL_TABLET | ORAL | 5 refills | Status: DC

## 2016-12-12 ENCOUNTER — Other Ambulatory Visit: Payer: Self-pay | Admitting: *Deleted

## 2016-12-12 MED ORDER — "INSULIN SYRINGE-NEEDLE U-100 31G X 5/16"" 1 ML MISC": 3 refills | Status: DC

## 2016-12-12 MED ORDER — INSULIN GLARGINE 100 UNIT/ML ~~LOC~~ SOLN: SUBCUTANEOUS | 3 refills | Status: DC

## 2017-01-16 ENCOUNTER — Telehealth: Payer: Self-pay | Admitting: Family Medicine

## 2017-01-16 ENCOUNTER — Other Ambulatory Visit (INDEPENDENT_AMBULATORY_CARE_PROVIDER_SITE_OTHER): Payer: Medicare HMO

## 2017-01-16 DIAGNOSIS — M1049 Other secondary gout, multiple sites: Secondary | ICD-10-CM

## 2017-01-16 DIAGNOSIS — E1165 Type 2 diabetes mellitus with hyperglycemia: Secondary | ICD-10-CM | POA: Diagnosis not present

## 2017-01-16 DIAGNOSIS — E7800 Pure hypercholesterolemia, unspecified: Secondary | ICD-10-CM

## 2017-01-16 DIAGNOSIS — IMO0001 Reserved for inherently not codable concepts without codable children: Secondary | ICD-10-CM

## 2017-01-16 LAB — COMPREHENSIVE METABOLIC PANEL
ALBUMIN: 4.4 g/dL (ref 3.5–5.2)
ALK PHOS: 59 U/L (ref 39–117)
ALT: 24 U/L (ref 0–53)
AST: 16 U/L (ref 0–37)
BUN: 22 mg/dL (ref 6–23)
CO2: 25 mEq/L (ref 19–32)
Calcium: 9.6 mg/dL (ref 8.4–10.5)
Chloride: 99 mEq/L (ref 96–112)
Creatinine, Ser: 1.33 mg/dL (ref 0.40–1.50)
GFR: 55.48 mL/min — AB (ref >60.00)
GLUCOSE: 348 mg/dL — AB (ref 70–99)
POTASSIUM: 4.4 meq/L (ref 3.5–5.1)
Sodium: 130 mEq/L — ABNORMAL LOW (ref 135–145)
TOTAL PROTEIN: 7.2 g/dL (ref 6.0–8.3)
Total Bilirubin: 0.5 mg/dL (ref 0.2–1.2)

## 2017-01-16 LAB — LIPID PANEL
CHOL/HDL RATIO: 5
CHOLESTEROL: 144 mg/dL (ref 0–200)
HDL: 29.6 mg/dL — ABNORMAL LOW (ref >39.00)
Triglycerides: 744 mg/dL — ABNORMAL HIGH (ref 0.0–149.0)

## 2017-01-16 LAB — LDL CHOLESTEROL, DIRECT: LDL DIRECT: 45 mg/dL

## 2017-01-16 LAB — HEMOGLOBIN A1C: HEMOGLOBIN A1C: 9.4 % — AB (ref 4.6–6.5)

## 2017-01-16 NOTE — Telephone Encounter (Signed)
-----  Message from Ellamae Sia sent at 01/03/2017  2:46 PM EST ----- Regarding: Lab orders for Monday, 2.12.18 :ab orders for a f/u DM appt

## 2017-01-20 ENCOUNTER — Encounter: Payer: Self-pay | Admitting: Family Medicine

## 2017-01-20 ENCOUNTER — Ambulatory Visit (INDEPENDENT_AMBULATORY_CARE_PROVIDER_SITE_OTHER): Payer: Medicare HMO | Admitting: Family Medicine

## 2017-01-20 VITALS — BP 130/84 | HR 93 | Temp 97.6°F | Ht 66.5 in | Wt 176.5 lb

## 2017-01-20 DIAGNOSIS — E7800 Pure hypercholesterolemia, unspecified: Secondary | ICD-10-CM

## 2017-01-20 DIAGNOSIS — E1165 Type 2 diabetes mellitus with hyperglycemia: Secondary | ICD-10-CM

## 2017-01-20 DIAGNOSIS — R5383 Other fatigue: Secondary | ICD-10-CM

## 2017-01-20 DIAGNOSIS — N182 Chronic kidney disease, stage 2 (mild): Secondary | ICD-10-CM

## 2017-01-20 DIAGNOSIS — I10 Essential (primary) hypertension: Secondary | ICD-10-CM

## 2017-01-20 DIAGNOSIS — R531 Weakness: Secondary | ICD-10-CM | POA: Insufficient documentation

## 2017-01-20 DIAGNOSIS — IMO0001 Reserved for inherently not codable concepts without codable children: Secondary | ICD-10-CM

## 2017-01-20 DIAGNOSIS — E871 Hypo-osmolality and hyponatremia: Secondary | ICD-10-CM | POA: Insufficient documentation

## 2017-01-20 LAB — T3, FREE: T3 FREE: 4 pg/mL (ref 2.3–4.2)

## 2017-01-20 LAB — CBC WITH DIFFERENTIAL/PLATELET
BASOS ABS: 0 10*3/uL (ref 0.0–0.1)
Basophils Relative: 0.3 % (ref 0.0–3.0)
EOS ABS: 0.2 10*3/uL (ref 0.0–0.7)
Eosinophils Relative: 2.3 % (ref 0.0–5.0)
HCT: 40 % (ref 39.0–52.0)
Hemoglobin: 13.7 g/dL (ref 13.0–17.0)
LYMPHS ABS: 3.6 10*3/uL (ref 0.7–4.0)
LYMPHS PCT: 36.2 % (ref 12.0–46.0)
MCHC: 34.2 g/dL (ref 30.0–36.0)
MCV: 88 fl (ref 78.0–100.0)
MONOS PCT: 9.3 % (ref 3.0–12.0)
Monocytes Absolute: 0.9 10*3/uL (ref 0.1–1.0)
NEUTROS ABS: 5.2 10*3/uL (ref 1.4–7.7)
NEUTROS PCT: 51.9 % (ref 43.0–77.0)
PLATELETS: 229 10*3/uL (ref 150.0–400.0)
RBC: 4.55 Mil/uL (ref 4.22–5.81)
RDW: 12.8 % (ref 11.5–15.5)
WBC: 10 10*3/uL (ref 4.0–10.5)

## 2017-01-20 LAB — VITAMIN B12: VITAMIN B 12: 320 pg/mL (ref 211–911)

## 2017-01-20 LAB — TSH: TSH: 5.06 u[IU]/mL — ABNORMAL HIGH (ref 0.35–4.50)

## 2017-01-20 LAB — VITAMIN D 25 HYDROXY (VIT D DEFICIENCY, FRACTURES): VITD: 32.71 ng/mL (ref 30.00–100.00)

## 2017-01-20 LAB — HM DIABETES FOOT EXAM

## 2017-01-20 LAB — T4, FREE: Free T4: 0.71 ng/dL (ref 0.60–1.60)

## 2017-01-20 MED ORDER — MELOXICAM 15 MG PO TABS: 15.0000 mg | ORAL_TABLET | Freq: Every day | ORAL | 0 refills | Status: DC

## 2017-01-20 MED ORDER — FENOFIBRATE 160 MG PO TABS: 160.0000 mg | ORAL_TABLET | Freq: Every day | ORAL | 5 refills | Status: DC

## 2017-01-20 NOTE — Progress Notes (Signed)
Pre visit review using our clinic review tool, if applicable. No additional management support is needed unless otherwise documented below in the visit note. 

## 2017-01-20 NOTE — Assessment & Plan Note (Signed)
Likely due to elevated sugar and inactivity.. But will eval with labs.

## 2017-01-20 NOTE — Assessment & Plan Note (Signed)
Likely falsely low due to increased sugar, but rec no Na restriction.

## 2017-01-20 NOTE — Assessment & Plan Note (Signed)
Stable control.

## 2017-01-20 NOTE — Progress Notes (Signed)
Subjective:    Patient ID: Brian Bass, male    DOB: August 13, 1941, 76 y.o.   MRN: [id number]  HPI   76 year old male with DM presents for follow up.  Diabetes:  Worsened control despite metformin, glipizide and lantus 80-90 units per day.  He thinks that he is much less active in winter Using medications without difficulties: Hypoglycemic episodes:NONE Hyperglycemic episodes: yes Feet problems:none Blood Sugars averaging: FBS 200-280 eye exam within last year: 07/2016  Elevated Cholesterol:  Very high triglycerides.. Likely due to elevated sugar.  On moderate dose statin simvastatin 40 mg daily. LDL remains at goal. He has been eating 3-4 moon pies a day. Also daily ice cream. Lab Results  Component Value Date   CHOL 144 01/16/2017   HDL 29.60 (L) 01/16/2017   LDLCALC 67 07/14/2016   LDLDIRECT 45.0 01/16/2017   TRIG (H) 01/16/2017    744.0 Triglyceride is over 400; calculations on Lipids are invalid.   CHOLHDL 5 01/16/2017    Using medications without problems: Muscle aches:  Diet compliance: Exercise: Other complaints:  Hypertension:    Using medication without problems or lightheadedness:  Chest pain with exertion: Edema: Short of breath: Average home BPs: Other issues:   He has been having joint pain.. Achy at times. No swelling and redness. Using 1-2 aleve daily.  More fatigued lately.  Cold natured.  No  Constipation.  No dry skin.  Social History /Family History/Past Medical History reviewed and updated if needed. Blood pressure 130/84, pulse 93, temperature 97.6 F (36.4 C), temperature source Oral, height 5' 6.5" (1.689 m), weight 176 lb 8 oz (80.1 kg).   Review of Systems  Constitutional: Positive for fatigue. Negative for chills and fever.  HENT: Negative for ear pain.   Eyes: Negative for pain.  Respiratory: Negative for shortness of breath.   Cardiovascular: Negative for chest pain, palpitations and leg swelling.  Gastrointestinal:  Negative for abdominal pain.  Neurological: Negative for weakness.       Objective:   Physical Exam  Constitutional: Vital signs are normal. He appears well-developed and well-nourished.  HENT:  Head: Normocephalic.  Right Ear: Hearing normal.  Left Ear: Hearing normal.  Nose: Nose normal.  Mouth/Throat: Oropharynx is clear and moist and mucous membranes are normal.  Neck: Trachea normal. Carotid bruit is not present. No thyroid mass and no thyromegaly present.  Cardiovascular: Normal rate, regular rhythm and normal pulses.  Exam reveals no gallop, no distant heart sounds and no friction rub.   No murmur heard. No peripheral edema  Pulmonary/Chest: Effort normal and breath sounds normal. No respiratory distress.  Skin: Skin is warm, dry and intact. No rash noted.  Psychiatric: He has a normal mood and affect. His speech is normal and behavior is normal. Thought content normal.      Diabetic foot exam: Normal inspection No skin breakdown No calluses  Normal DP pulses decreased sensation to light touch and monofilament at toes Nails normal     Assessment & Plan:

## 2017-01-20 NOTE — Assessment & Plan Note (Signed)
LDL at goal on statin but trig very high. Pt with no sign of pancreatitis... Start fenofibrate and recheck chol in 6-8 weeks.

## 2017-01-20 NOTE — Assessment & Plan Note (Signed)
Well controlled on no med.

## 2017-01-20 NOTE — Patient Instructions (Addendum)
Start fenofibrate daily in addition to other cholesterol meds.  Stop fatty foods and sugary foods... Like moon pies and ice cream.  Get back to regular activity.  If blood sugars not coming down.. Increase insulin to 95 units daily.  Stop at lab on way out.  Use tyelnol first.. If not helping.. Then can start meloxicam on limited basis for joint pain.. Stop aleve of ibuprofen.

## 2017-01-20 NOTE — Assessment & Plan Note (Signed)
Poorc tonrl, worse I winter.. Noncompliant with diet and exercise.  Get back on track.. If CBG remaining high.. Increase insulin to 95 units daily. May need to add additional med.

## 2017-03-09 ENCOUNTER — Telehealth: Payer: Self-pay | Admitting: Family Medicine

## 2017-03-09 DIAGNOSIS — E1165 Type 2 diabetes mellitus with hyperglycemia: Secondary | ICD-10-CM

## 2017-03-09 DIAGNOSIS — E7800 Pure hypercholesterolemia, unspecified: Secondary | ICD-10-CM

## 2017-03-09 DIAGNOSIS — IMO0001 Reserved for inherently not codable concepts without codable children: Secondary | ICD-10-CM

## 2017-03-09 DIAGNOSIS — M1049 Other secondary gout, multiple sites: Secondary | ICD-10-CM

## 2017-03-09 NOTE — Telephone Encounter (Signed)
-----  Message from Ellamae Sia sent at 02/27/2017  4:15 PM EDT ----- Regarding: Lab orders for Thursday, 4.12.18 Lab orders for 8 week labs

## 2017-03-09 NOTE — Telephone Encounter (Signed)
Pt not due for labs for another month.. 3 months from the last ones. Is he not going to be in town or something? I ordered them, but ideally he should have them anytime after 3 months.

## 2017-03-10 NOTE — Telephone Encounter (Signed)
AVS states to return in 8 weeks to recheck triglycerides.

## 2017-03-10 NOTE — Telephone Encounter (Signed)
Agree, pt does need chol rechecked. Labs entered.

## 2017-03-10 NOTE — Addendum Note (Signed)
Addended by: [name] E on: 03/10/2017 05:09 PM   Modules accepted: Orders

## 2017-03-17 ENCOUNTER — Other Ambulatory Visit (INDEPENDENT_AMBULATORY_CARE_PROVIDER_SITE_OTHER): Payer: Medicare HMO

## 2017-03-17 ENCOUNTER — Encounter: Payer: Self-pay | Admitting: *Deleted

## 2017-03-17 DIAGNOSIS — E7800 Pure hypercholesterolemia, unspecified: Secondary | ICD-10-CM | POA: Diagnosis not present

## 2017-03-17 LAB — LIPID PANEL
Cholesterol: 132 mg/dL (ref 0–200)
HDL: 30.4 mg/dL — AB (ref >39.00)
LDL Cholesterol: 61 mg/dL (ref 0–99)
NonHDL: 101.13
TRIGLYCERIDES: 199 mg/dL — AB (ref 0.0–149.0)
Total CHOL/HDL Ratio: 4
VLDL: 39.8 mg/dL (ref 0.0–40.0)

## 2017-03-20 ENCOUNTER — Encounter: Payer: Self-pay | Admitting: Family Medicine

## 2017-03-21 NOTE — Telephone Encounter (Signed)
Mrs Culley(DPR signed) request cb from [name] CMA; I advised pts wife I am a nurse and would be glad to help; she said it was in ref to letter received about labs and the email Mrs Certain sent to Dr Diona Browner and pts wife would feel better to talk to [name] instead of me. Mrs Morrell request cb to [phone].

## 2017-03-27 ENCOUNTER — Encounter: Payer: Self-pay | Admitting: Nurse Practitioner

## 2017-03-27 ENCOUNTER — Ambulatory Visit (INDEPENDENT_AMBULATORY_CARE_PROVIDER_SITE_OTHER): Payer: Medicare HMO | Admitting: Nurse Practitioner

## 2017-03-27 VITALS — BP 122/82 | HR 70 | Ht 66.5 in | Wt 172.0 lb

## 2017-03-27 DIAGNOSIS — R1310 Dysphagia, unspecified: Secondary | ICD-10-CM | POA: Diagnosis not present

## 2017-03-27 NOTE — Patient Instructions (Signed)
If you are age 77 or older, your body mass index should be between 23-30. Your Body mass index is 27.35 kg/m. If this is out of the aforementioned range listed, please consider follow up with your Primary Care Provider.  If you are age 85 or younger, your body mass index should be between 19-25. Your Body mass index is 27.35 kg/m. If this is out of the aformentioned range listed, please consider follow up with your Primary Care Provider.   You have been scheduled for an endoscopy. Please follow written instructions given to you at your visit today. If you use inhalers (even only as needed), please bring them with you on the day of your procedure. Your physician has requested that you go to www.startemmi.com and enter the access code given to you at your visit today. This web site gives a general overview about your procedure. However, you should still follow specific instructions given to you by our office regarding your preparation for the procedure.   Thank you for choosing me and [facility].  Tye Savoy, NP

## 2017-03-27 NOTE — Progress Notes (Addendum)
HPI: Patient is a 76 year old male known to Dr. Hilarie Fredrickson for hx of adenomatous colon polyps,  he is up to date on surveillance exam. Patient comes in today for evaluation of dysphagia. He had an EGD in 2009 with savary dilation of a distal esoph stricture. No recurrent dysphagia until a few years ago. He complains of solid food dysphagia but is able to manage if eats slowly, takes small bites, and drinks plenty of fluids. A barium swallow in 2014 revealed a mildly prominent schatsks ring, esophageal motility disorder with numerous tertiary contractions. No stricture or mass. Other than dysphagia he has no GI complaints.    Past Medical History:  Diagnosis Date  . Adenomatous colon polyp   . Complication of anesthesia    Per pt, hard to wake up past last colon in 2011!  . Diabetes mellitus   . Esophageal stricture   . GERD (gastroesophageal reflux disease)   . History of colon polyps 922/2011  . Hyperlipidemia   . IBS (irritable bowel syndrome)     Past Surgical History:  Procedure Laterality Date  . CORONARY ARTERY BYPASS GRAFT  10-2010   3 vessels   Family History  Problem Relation Age of Onset  . Heart failure Mother   . Aneurysm Father   . Alcohol abuse Brother   . Colon cancer Cousin    Social History  Substance Use Topics  . Smoking status: Never Smoker  . Smokeless tobacco: Never Used  . Alcohol use No   Current Outpatient Prescriptions  Medication Sig Dispense Refill  . ACCU-CHEK FASTCLIX LANCETS MISC 1 Device by Other route 3 (three) times daily. Check blood sugars 2 to 3 times daily.  Dx: 250.02 102 each 5  . ACCU-CHEK SMARTVIEW test strip USE TO CHECK BLOOD SUGARS 2 TO 3 TIMES DAILY.  300 each 3  . aspirin 81 MG tablet Take 81 mg by mouth daily.    . Blood Glucose Monitoring Suppl (ACCU-CHEK NANO SMARTVIEW) W/DEVICE KIT 1 kit by Other route 3 (three) times daily. Use to check blood sugars 2 to 3 times daily.  Dx: 250.02 1 kit 0  . fenofibrate 160 MG tablet  Take 1 tablet (160 mg total) by mouth daily. 30 tablet 5  . glipiZIDE (GLUCOTROL) 10 MG tablet TAKE 1 TABLET (10 MG TOTAL) BY MOUTH 2 (TWO) TIMES DAILY BEFORE A MEAL. 60 tablet 5  . insulin glargine (LANTUS) 100 UNIT/ML injection INJECT 0.55 MLS (55 UNITS TOTAL) SUBCUTANEOUSLY 2 TIMES DAILY.  Dx: E11.65 100 mL 3  . Insulin Syringe-Needle U-100 (BD INSULIN SYRINGE ULTRAFINE) 31G X 5/16" 1 ML MISC Use to inject Lantus insulin 2 times daily.  Dx: E11.65 200 each 3  . meloxicam (MOBIC) 15 MG tablet Take 1 tablet (15 mg total) by mouth daily. 30 tablet 0  . metFORMIN (GLUCOPHAGE) 500 MG tablet Take 1 tablet (500 mg total) by mouth 2 (two) times daily with a meal. 180 tablet 1  . Multiple Vitamin (MULTIVITAMIN) tablet Take 1 tablet by mouth daily.      . Omega-3 Fatty Acids (FISH OIL) 1200 MG CAPS Take 1 capsule by mouth daily.    [name] omeprazole (PRILOSEC) 20 MG capsule TAKE 1 CAPSULE EVERY DAY 90 capsule 3  . simvastatin (ZOCOR) 40 MG tablet TAKE 1 TABLET AT BEDTIME 90 tablet 3   No current facility-administered medications for this visit.    No Known Allergies   Review of Systems: All systems reviewed and  negative except where noted in HPI.    Physical Exam: BP 122/82   Pulse 70   Ht 5' 6.5" (1.689 m)   Wt 172 lb (78 kg)   BMI 27.35 kg/m  Constitutional:  Well-developed, white male in no acute distress. Psychiatric: Normal mood and affect. Behavior is normal. EENT:  Conjunctivae are normal. No scleral icterus. Neck supple.  Cardiovascular: Normal rate, regular rhythm.  Pulmonary/chest: Effort normal and breath.  Bibasilar velcro type sounds Abdominal: Soft, nondistended, nontender. Bowel sounds active throughout. There are no masses palpable. No hepatomegaly. Extremities: no edema Lymphadenopathy: No cervical adenopathy noted. Neurological: Alert and oriented to person place and time. Skin: Skin is warm and dry. No rashes noted.   ASSESSMENT AND PLAN:  76 yo male with recurrent  solid food dysphagia. He does have a history of a distal esoph stricture in 2009, s/p savary dilation. Barium Swallow in 2014 revealed a mildly prominent Schatzki's ring as well as an esophageal motility disorder with short delay of tablet passage.  -Patient states he felt much better after dilation in 2009 now having recurrent symptoms for last few years. His dysphagia is probably due for the most part to a dysmotility disorder. We discussed repeat barium swallow followed by EGD if indicated. Patient would really like to proceed with the EGD. He understands that procedure may or may not be therapeutic. The risks and benefits of EGD were discussed and the patient agrees to proceed. -In the interim I advised patient to eat small bites, chew well with liquids in between bites to avoid food impaction.    Tye Savoy, NP  03/27/2017, 10:27 AM  Addendum: Reviewed and agree with initial management. Jerene Bears, MD

## 2017-04-01 ENCOUNTER — Other Ambulatory Visit: Payer: Self-pay | Admitting: Family Medicine

## 2017-04-19 ENCOUNTER — Encounter: Payer: Self-pay | Admitting: Internal Medicine

## 2017-05-02 ENCOUNTER — Other Ambulatory Visit: Payer: Self-pay | Admitting: Family Medicine

## 2017-05-03 ENCOUNTER — Encounter: Payer: Self-pay | Admitting: Internal Medicine

## 2017-05-03 ENCOUNTER — Ambulatory Visit (AMBULATORY_SURGERY_CENTER): Payer: Medicare HMO | Admitting: Internal Medicine

## 2017-05-03 VITALS — BP 127/76 | HR 60 | Temp 97.3°F | Resp 12 | Ht 66.5 in | Wt 172.0 lb

## 2017-05-03 DIAGNOSIS — R1310 Dysphagia, unspecified: Secondary | ICD-10-CM

## 2017-05-03 DIAGNOSIS — E119 Type 2 diabetes mellitus without complications: Secondary | ICD-10-CM | POA: Diagnosis not present

## 2017-05-03 DIAGNOSIS — K222 Esophageal obstruction: Secondary | ICD-10-CM

## 2017-05-03 MED ORDER — SODIUM CHLORIDE 0.9 % IV SOLN: 500.0000 mL | INTRAVENOUS | Status: DC

## 2017-05-03 NOTE — Progress Notes (Signed)
Called to room to assist during endoscopic procedure.  Patient ID and intended procedure confirmed with present staff. Received instructions for my participation in the procedure from the performing physician.

## 2017-05-03 NOTE — Telephone Encounter (Signed)
Last office visit 01/30/2017.  Last refilled 01/20/2017 for #30 with no refills.  Ok to refill?

## 2017-05-03 NOTE — Patient Instructions (Signed)
YOU HAD AN ENDOSCOPIC PROCEDURE TODAY AT [facility]:   Refer to the procedure report that was given to you for any specific questions about what was found during the examination.  If the procedure report does not answer your questions, please call your gastroenterologist to clarify.  If you requested that your care partner not be given the details of your procedure findings, then the procedure report has been included in a sealed envelope for you to review at your convenience later.  YOU SHOULD EXPECT: Some feelings of bloating in the abdomen. Passage of more gas than usual.  Walking can help get rid of the air that was put into your GI tract during the procedure and reduce the bloating. If you had a lower endoscopy (such as a colonoscopy or flexible sigmoidoscopy) you may notice spotting of blood in your stool or on the toilet paper. If you underwent a bowel prep for your procedure, you may not have a normal bowel movement for a few days.  Please Note:  You might notice some irritation and congestion in your nose or some drainage.  This is from the oxygen used during your procedure.  There is no need for concern and it should clear up in a day or so.  SYMPTOMS TO REPORT IMMEDIATELY:    Following upper endoscopy (EGD)  Vomiting of blood or coffee ground material  New chest pain or pain under the shoulder blades  Painful or persistently difficult swallowing  New shortness of breath  Fever of 100F or higher  Black, tarry-looking stools  For urgent or emergent issues, a gastroenterologist can be reached at any hour by calling [phone].   DIET:  We do recommend a small meal at first, but then you may proceed to your regular diet.  Drink plenty of fluids but you should avoid alcoholic beverages for 24 hours.  ACTIVITY:  You should plan to take it easy for the rest of today and you should NOT DRIVE or use heavy machinery until tomorrow (because of the sedation medicines used  during the test).    FOLLOW UP: Our staff will call the number listed on your records the next business day following your procedure to check on you and address any questions or concerns that you may have regarding the information given to you following your procedure. If we do not reach you, we will leave a message.  However, if you are feeling well and you are not experiencing any problems, there is no need to return our call.  We will assume that you have returned to your regular daily activities without incident.  If any biopsies were taken you will be contacted by phone or by letter within the next 1-3 weeks.  Please call us at [phone] if you have not heard about the biopsies in 3 weeks.    SIGNATURES/CONFIDENTIALITY: You and/or your care partner have signed paperwork which will be entered into your electronic medical record.  These signatures attest to the fact that that the information above on your After Visit Summary has been reviewed and is understood.  Full responsibility of the confidentiality of this discharge information lies with you and/or your care-partner.  Stricture and after dilation diet information given.

## 2017-05-03 NOTE — Progress Notes (Signed)
Report to PACU, RN, vss, BBS= Clear.  

## 2017-05-03 NOTE — Op Note (Signed)
[place] Patient Name: Brian Bass Procedure Date: 05/03/2017 10:21 AM MRN: [id number] Endoscopist: Jerene Bears , MD Age: 76 Referring MD:  Date of Birth: 11-13-1941 Gender: Male Account #: [id number] Procedure:                Upper GI endoscopy Indications:              Dysphagia Medicines:                Monitored Anesthesia Care Procedure:                Pre-Anesthesia Assessment:                           - Prior to the procedure, a History and Physical                            was performed, and patient medications and                            allergies were reviewed. The patient's tolerance of                            previous anesthesia was also reviewed. The risks                            and benefits of the procedure and the sedation                            options and risks were discussed with the patient.                            All questions were answered, and informed consent                            was obtained. Prior Anticoagulants: The patient has                            taken no previous anticoagulant or antiplatelet                            agents. ASA Grade Assessment: II - A patient with                            mild systemic disease. After reviewing the risks                            and benefits, the patient was deemed in                            satisfactory condition to undergo the procedure.                           After obtaining informed consent, the endoscope was  passed under direct vision. Throughout the                            procedure, the patient's blood pressure, pulse, and                            oxygen saturations were monitored continuously. The                            Model GIF-[id number]) scope was introduced                            through the mouth, and advanced to the second part                            of duodenum. The upper GI endoscopy was                          accomplished without difficulty. The patient                            tolerated the procedure well. Scope In: Scope Out: Findings:                 A low-grade of narrowing Schatzki ring (acquired)                            was found at the gastroesophageal junction at 35                            cm. A TTS dilator was passed through the scope.                            Dilation with an 10-16-12 mm balloon dilator was                            performed to 13 mm. The dilation site was examined                            and showed moderate improvement in luminal                            narrowing with mucosa rent in 1 location. Cold                            biopsy forceps were used to further disrupt the                            ring in 4 quadrants (specimens were discarded).                           A 3 cm hiatal hernia was present.  The entire examined stomach was normal.                           The examined duodenum was normal. Complications:            No immediate complications. Estimated Blood Loss:     Estimated blood loss was minimal. Impression:               - Low-grade of narrowing Schatzki ring. Dilated as                            above                           - 3 cm hiatal hernia.                           - Normal stomach.                           - Normal examined duodenum. Recommendation:           - Patient has a contact number available for                            emergencies. The signs and symptoms of potential                            delayed complications were discussed with the                            patient. Return to normal activities tomorrow.                            Written discharge instructions were provided to the                            patient.                           - Resume previous diet as tolerated after soft diet                            today.                            - Continue present medications.                           - Repeat upper endoscopy if persistent dysphagia                            for retreatment.                           - If trouble swallowing fails to improve please  call my office. Jerene Bears, MD 05/03/2017 10:46:12 AM This report has been signed electronically.

## 2017-05-04 ENCOUNTER — Telehealth: Payer: Self-pay

## 2017-05-04 NOTE — Telephone Encounter (Signed)
  Follow up Call-  Call back number 05/03/2017 01/11/2016  Post procedure Call Back phone  # [phone]  Permission to leave phone message Yes Yes  Some recent data might be hidden     Patient questions:  Do you have a fever, pain , or abdominal swelling? No. Pain Score  0 *  Have you tolerated food without any problems? Yes.    Have you been able to return to your normal activities? Yes.    Do you have any questions about your discharge instructions: Diet   No. Medications  No. Follow up visit  No.  Do you have questions or concerns about your Care? Yes.  Pt. Knows he will need further dilation in the future.   Told him to communicate with Dr. Vena Rua office in the future when he felt it was time for assistance, and we would be glad to help any time.  Actions: * If pain score is 4 or above: No action needed, pain <4.

## 2017-05-26 ENCOUNTER — Other Ambulatory Visit: Payer: Self-pay | Admitting: Family Medicine

## 2017-05-26 ENCOUNTER — Telehealth: Payer: Self-pay | Admitting: Family Medicine

## 2017-05-26 NOTE — Telephone Encounter (Signed)
I advised her that rx was sent in by Dr Diona Browner this morning. She will check with the pharmacy, again, and have them call us if they have any issues

## 2017-05-26 NOTE — Telephone Encounter (Signed)
Spouse called pt needs refill on lantus  (Vial) Pt has been called out of town on an emergency needs meds ASAP.    [facility]  ATTN:  ROBIN Please call spouse when this has been called in

## 2017-07-18 ENCOUNTER — Telehealth: Payer: Self-pay | Admitting: Family Medicine

## 2017-07-18 ENCOUNTER — Other Ambulatory Visit: Payer: Self-pay | Admitting: Family Medicine

## 2017-07-18 DIAGNOSIS — IMO0001 Reserved for inherently not codable concepts without codable children: Secondary | ICD-10-CM

## 2017-07-18 DIAGNOSIS — E1165 Type 2 diabetes mellitus with hyperglycemia: Principal | ICD-10-CM

## 2017-07-18 NOTE — Telephone Encounter (Signed)
-----  Message from Ellamae Sia sent at 07/13/2017 10:37 AM EDT ----- Regarding: Lab orders for Wednesday, 8.15.18 Lab orders for a 6 month follow up appt

## 2017-07-19 ENCOUNTER — Other Ambulatory Visit (INDEPENDENT_AMBULATORY_CARE_PROVIDER_SITE_OTHER): Payer: Medicare HMO

## 2017-07-19 DIAGNOSIS — IMO0001 Reserved for inherently not codable concepts without codable children: Secondary | ICD-10-CM

## 2017-07-19 DIAGNOSIS — E1165 Type 2 diabetes mellitus with hyperglycemia: Secondary | ICD-10-CM

## 2017-07-19 LAB — COMPREHENSIVE METABOLIC PANEL
ALBUMIN: 4.4 g/dL (ref 3.5–5.2)
ALT: 29 U/L (ref 0–53)
AST: 30 U/L (ref 0–37)
Alkaline Phosphatase: 43 U/L (ref 39–117)
BILIRUBIN TOTAL: 0.4 mg/dL (ref 0.2–1.2)
BUN: 12 mg/dL (ref 6–23)
CALCIUM: 9.7 mg/dL (ref 8.4–10.5)
CO2: 26 meq/L (ref 19–32)
CREATININE: 1.17 mg/dL (ref 0.40–1.50)
Chloride: 105 mEq/L (ref 96–112)
GFR: 64.24 mL/min (ref >60.00)
Glucose, Bld: 64 mg/dL — ABNORMAL LOW (ref 70–99)
Potassium: 4.4 mEq/L (ref 3.5–5.1)
Sodium: 137 mEq/L (ref 135–145)
Total Protein: 6.8 g/dL (ref 6.0–8.3)

## 2017-07-19 LAB — LIPID PANEL
CHOLESTEROL: 146 mg/dL (ref 0–200)
HDL: 40.9 mg/dL (ref >39.00)
LDL Cholesterol: 84 mg/dL (ref 0–99)
NonHDL: 105.55
TRIGLYCERIDES: 109 mg/dL (ref 0.0–149.0)
Total CHOL/HDL Ratio: 4
VLDL: 21.8 mg/dL (ref 0.0–40.0)

## 2017-07-19 LAB — MICROALBUMIN / CREATININE URINE RATIO
Creatinine,U: 60.1 mg/dL
Microalb Creat Ratio: 1.2 mg/g (ref 0.0–30.0)
Microalb, Ur: <0.7 mg/dL (ref 0.0–1.9)

## 2017-07-19 LAB — HEMOGLOBIN A1C: HEMOGLOBIN A1C: 8.1 % — AB (ref 4.6–6.5)

## 2017-07-21 ENCOUNTER — Encounter: Payer: Self-pay | Admitting: Family Medicine

## 2017-07-21 ENCOUNTER — Ambulatory Visit (INDEPENDENT_AMBULATORY_CARE_PROVIDER_SITE_OTHER): Payer: Medicare HMO | Admitting: Family Medicine

## 2017-07-21 DIAGNOSIS — E1165 Type 2 diabetes mellitus with hyperglycemia: Secondary | ICD-10-CM

## 2017-07-21 DIAGNOSIS — I10 Essential (primary) hypertension: Secondary | ICD-10-CM

## 2017-07-21 DIAGNOSIS — E7800 Pure hypercholesterolemia, unspecified: Secondary | ICD-10-CM

## 2017-07-21 DIAGNOSIS — IMO0001 Reserved for inherently not codable concepts without codable children: Secondary | ICD-10-CM

## 2017-07-21 MED ORDER — FENOFIBRATE 160 MG PO TABS: 160.0000 mg | ORAL_TABLET | Freq: Every day | ORAL | 1 refills | Status: DC

## 2017-07-21 MED ORDER — MELOXICAM 15 MG PO TABS: 15.0000 mg | ORAL_TABLET | Freq: Every day | ORAL | 1 refills | Status: DC

## 2017-07-21 NOTE — Assessment & Plan Note (Signed)
Good control on current regimen.

## 2017-07-21 NOTE — Assessment & Plan Note (Signed)
Improvement with decreased weight and smaller portions. I hesitate to increase medication given occ lows when he is more active in [place].  Avoid lows by not skipping meals.

## 2017-07-21 NOTE — Patient Instructions (Addendum)
Decrease portion size here as well like [place].  Try to be active in winter as well as in summer.  Keep up walking.

## 2017-07-21 NOTE — Progress Notes (Signed)
   Subjective:    Patient ID: Brian Bass, male    DOB: 1941-09-20, 76 y.o.   MRN: [id number]  HPI    76 year old male presents for follow up DM 6 months.   Diabetes:   Significant improvement in a1C.[name] He has lost weight as well.  He has been eating more healthy. He is more active in [place] Lab Results  Component Value Date   HGBA1C 8.1 (H) 07/19/2017  Using medications without difficulties: Hypoglycemic episodes: occ Hyperglycemic episodes: occ Feet problems: none Blood Sugars averaging: FBS 160 eye exam within last year:  Wt Readings from Last 3 Encounters:  07/21/17 163 lb (73.9 kg)  05/03/17 172 lb (78 kg)  03/27/17 172 lb (78 kg)   Blood pressure 116/76, pulse 78, weight 163 lb (73.9 kg), SpO2 97 %.  Elevated Cholesterol: sta ble control. LDL almost at goal. Using medications without problems: Muscle aches: none     Review of Systems  Constitutional: Negative for fatigue and fever.  HENT: Negative for ear pain.        Pot nasal drip occ causing clearing of throat.  Eyes: Negative for pain.  Respiratory: Negative for cough and shortness of breath.   Cardiovascular: Negative for chest pain, palpitations and leg swelling.  Gastrointestinal: Negative for abdominal pain.  Genitourinary: Negative for dysuria.  Musculoskeletal: Negative for arthralgias.  Neurological: Negative for syncope, light-headedness and headaches.  Psychiatric/Behavioral: Negative for dysphoric mood.       Objective:   Physical Exam  Constitutional: Vital signs are normal. He appears well-developed and well-nourished.  HENT:  Head: Normocephalic.  Right Ear: Hearing normal.  Left Ear: Hearing normal.  Nose: Nose normal.  Mouth/Throat: Oropharynx is clear and moist and mucous membranes are normal.  Neck: Trachea normal. Carotid bruit is not present. No thyroid mass and no thyromegaly present.  Cardiovascular: Normal rate, regular rhythm and normal pulses.  Exam reveals no  gallop, no distant heart sounds and no friction rub.   No murmur heard. No peripheral edema  Pulmonary/Chest: Effort normal. No respiratory distress. He has rales in the right lower field and the left lower field.  Stable crackles in bas e of lungs.. Pt without SOB.  Skin: Skin is warm, dry and intact. No rash noted.  Psychiatric: He has a normal mood and affect. His speech is normal and behavior is normal. Thought content normal.          Assessment & Plan:

## 2017-07-21 NOTE — Assessment & Plan Note (Signed)
Well controlled. Continue current medication.  

## 2017-08-14 ENCOUNTER — Other Ambulatory Visit: Payer: Self-pay | Admitting: *Deleted

## 2017-08-14 MED ORDER — GLUCOSE BLOOD VI STRP: ORAL_STRIP | 3 refills | Status: DC

## 2017-09-29 ENCOUNTER — Ambulatory Visit (INDEPENDENT_AMBULATORY_CARE_PROVIDER_SITE_OTHER): Payer: Medicare HMO

## 2017-09-29 DIAGNOSIS — Z23 Encounter for immunization: Secondary | ICD-10-CM

## 2017-10-03 DIAGNOSIS — H524 Presbyopia: Secondary | ICD-10-CM | POA: Diagnosis not present

## 2017-11-20 ENCOUNTER — Encounter: Payer: Self-pay | Admitting: Family Medicine

## 2017-11-20 ENCOUNTER — Ambulatory Visit: Payer: Medicare HMO | Admitting: Family Medicine

## 2017-11-20 VITALS — BP 130/70 | HR 78 | Temp 97.6°F | Ht 66.5 in | Wt 171.5 lb

## 2017-11-20 DIAGNOSIS — R0781 Pleurodynia: Secondary | ICD-10-CM

## 2017-11-20 NOTE — Progress Notes (Signed)
Dr. Frederico Hamman T. Sonoma Firkus, MD, [facility] [place], [address] Phone: [phone] Fax: [phone]  11/20/2017  Patient: Brian Bass, MRN: [id number], DOB: [birth year], 76 y.o.  Primary Physician:  Jinny Sanders, MD   Chief Complaint  Patient presents with  . Fall    2 weeks ago,  upper back   Subjective:   Brian Bass is a 76 y.o. very pleasant male patient who presents with the following:  [place] a couple of weeks ago - fell before the snow. Got feet tangled up on the L side. Some trouble with deep breaths. Coughing sneezing - all hurting. Has used a back brace to help with the pain some.   Taking some Motrin.   Past Medical History, Surgical History, Social History, Family History, Problem List, Medications, and Allergies have been reviewed and updated if relevant.  Patient Active Problem List   Diagnosis Date Noted  . Fatigue 01/20/2017  . Hyponatremia 01/20/2017  . Hoarseness of voice 07/19/2016  . Counseling regarding end of life decision making 07/14/2015  . GERD (gastroesophageal reflux disease) 12/13/2013  . Diffuse Parynchymal Lung Disease 11/08/2013  . CKD (chronic kidney disease) stage 2, GFR 60-89 ml/min 09/06/2013  . Rales 11/22/2012  . Gout of multiple sites 12/03/2009  . CAD (coronary artery disease) 11/02/2009  . Diabetes mellitus type 2, uncontrolled, without complications ([place]) 23/76/2831  . HYPERCHOLESTEROLEMIA 08/26/2009  . ESSENTIAL HYPERTENSION, BENIGN 08/26/2009  . OSTEOARTHRITIS, MULTIPLE JOINTS 08/26/2009  . BENIGN PROSTATIC HYPERTROPHY, MILD, HX OF 08/26/2009  . ESOPHAGEAL STRICTURE 10/14/2008  . IRRITABLE BOWEL SYNDROME 09/02/2008  . PERSONAL HX COLONIC POLYPS 09/02/2008    Past Medical History:  Diagnosis Date  . Adenomatous colon polyp   . Allergy   . Arthritis   . Complication of anesthesia    Per pt, hard to wake up past last colon in 2011!  . Diabetes  mellitus   . Esophageal stricture   . GERD (gastroesophageal reflux disease)   . History of colon polyps 922/2011  . Hyperlipidemia   . IBS (irritable bowel syndrome)     Past Surgical History:  Procedure Laterality Date  . COLONOSCOPY    . CORONARY ARTERY BYPASS GRAFT  10-2010   3 vessels  . UPPER GASTROINTESTINAL ENDOSCOPY      Social History   Socioeconomic History  . Marital status: Married    Spouse name: Not on file  . Number of children: Not on file  . Years of education: Not on file  . Highest education level: Not on file  Social Needs  . Financial resource strain: Not on file  . Food insecurity - worry: Not on file  . Food insecurity - inability: Not on file  . Transportation needs - medical: Not on file  . Transportation needs - non-medical: Not on file  Occupational History  . 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 Smoking status: Never Smoker  . Smokeless tobacco: Never Used  Substance and Sexual Activity  . Alcohol use: No  . Drug use: No  . Sexual activity: No  Other Topics Concern  . Not on file  Social History Narrative   Patient does not get regular exercise, but manual labor with farm work   Diet: fruits and veggies, fish, chicken, water        Family History  Problem Relation Age of Onset  . Heart failure Mother   .  Aneurysm Father   . Alcohol abuse Brother   . Colon cancer Cousin   . Esophageal cancer Neg Hx   . Rectal cancer Neg Hx   . Stomach cancer Neg Hx     No Known Allergies  Medication list reviewed and updated in full in [place].  GEN: No fevers, chills. Nontoxic. Primarily MSK c/o today. MSK: Detailed in the HPI GI: tolerating PO intake without difficulty Neuro: No numbness, parasthesias, or tingling associated. Otherwise the pertinent positives of the ROS are noted above.   Objective:   BP 130/70   Pulse 78   Temp 97.6 F (36.4 C) (Oral)   Ht 5' 6.5" (1.689 m)   Wt  171 lb 8 oz (77.8 kg)   BMI 27.27 kg/m    GEN: WDWN, NAD, Non-toxic, Alert & Oriented x 3 HEENT: Atraumatic, Normocephalic.  Ears and Nose: No external deformity. EXTR: No clubbing/cyanosis/edema NEURO: Normal gait.  PSYCH: Normally interactive. Conversant. Not depressed or anxious appearing.  Calm demeanor.    Patient has pain mostly in the posterior thorax approximately at rib level 6.  Anterior chest compression and sternal compression is not really significantly painful.  Radiology: No results found.  Assessment and Plan:   Rib pain on left side  Cont with conservative care, and he should do well. Likely healing rib fracture  Follow-up: No Follow-up on file.  Future Appointments  Date Time Provider [place]  01/12/2018  9:00 AM Eustace Pen, LPN [facility]  3/40/3709 10:00 AM Bedsole, Amy E, MD [facility]    No orders of the defined types were placed in this encounter.  Medications Discontinued During This Encounter  Medication Reason  . LANTUS 100 UNIT/ML injection Duplicate   No orders of the defined types were placed in this encounter.   Signed,  Maud Deed. Jamilette Suchocki, MD   Allergies as of 11/20/2017   No Known Allergies     Medication List        Accurate as of 11/20/17 11:46 AM. Always use your most recent med list.          ACCU-CHEK FASTCLIX LANCETS Misc 1 Device by Other route 3 (three) times daily. Check blood sugars 2 to 3 times daily.  Dx: 250.02   ACCU-CHEK NANO SMARTVIEW w/Device Kit 1 kit by Other route 3 (three) times daily. Use to check blood sugars 2 to 3 times daily.  Dx: 250.02   aspirin 81 MG tablet Take 81 mg by mouth daily.   fenofibrate 160 MG tablet Take 1 tablet (160 mg total) by mouth daily.   Fish Oil 1200 MG Caps Take 1 capsule by mouth daily.   glipiZIDE 10 MG tablet Commonly known as:  GLUCOTROL TAKE 1 TABLET (10 MG TOTAL) BY MOUTH 2 (TWO) TIMES DAILY BEFORE A MEAL.   glucose blood test  strip Commonly known as:  ACCU-CHEK SMARTVIEW USE TO CHECK BLOOD SUGARS 2 TO 3 TIMES DAILY.  Dx: E11.65   insulin glargine 100 UNIT/ML injection Commonly known as:  LANTUS INJECT 0.55 MLS (55 UNITS TOTAL) SUBCUTANEOUSLY 2 TIMES DAILY.  Dx: E11.65   Insulin Syringe-Needle U-100 31G X 5/16" 1 ML Misc Commonly known as:  BD INSULIN SYRINGE ULTRAFINE Use to inject Lantus insulin 2 times daily.  Dx: E11.65   meloxicam 15 MG tablet Commonly known as:  MOBIC Take 1 tablet (15 mg total) by mouth daily.   metFORMIN 500 MG tablet Commonly known as:  GLUCOPHAGE TAKE ONE TABLET BY  MOUTH TWO TIMES A DAY WITH A MEAL   multivitamin tablet Take 1 tablet by mouth daily.   omeprazole 20 MG capsule Commonly known as:  PRILOSEC TAKE 1 CAPSULE EVERY DAY   simvastatin 40 MG tablet Commonly known as:  ZOCOR TAKE 1 TABLET AT BEDTIME

## 2017-11-29 ENCOUNTER — Other Ambulatory Visit: Payer: Self-pay | Admitting: Family Medicine

## 2018-01-11 ENCOUNTER — Telehealth: Payer: Self-pay | Admitting: Family Medicine

## 2018-01-11 DIAGNOSIS — E1165 Type 2 diabetes mellitus with hyperglycemia: Principal | ICD-10-CM

## 2018-01-11 DIAGNOSIS — M1049 Other secondary gout, multiple sites: Secondary | ICD-10-CM

## 2018-01-11 DIAGNOSIS — IMO0001 Reserved for inherently not codable concepts without codable children: Secondary | ICD-10-CM

## 2018-01-11 NOTE — Telephone Encounter (Signed)
-----  Message from Eustace Pen, LPN sent at 08/11/262  3:25 PM EST ----- Regarding: Labs 2/7 Lab orders needed. Thank you.  Insurance:  [employer]

## 2018-01-12 ENCOUNTER — Ambulatory Visit (INDEPENDENT_AMBULATORY_CARE_PROVIDER_SITE_OTHER): Payer: Medicare HMO

## 2018-01-12 VITALS — BP 124/80 | HR 73 | Temp 97.8°F | Ht 66.5 in | Wt 170.5 lb

## 2018-01-12 DIAGNOSIS — M1049 Other secondary gout, multiple sites: Secondary | ICD-10-CM | POA: Diagnosis not present

## 2018-01-12 DIAGNOSIS — IMO0001 Reserved for inherently not codable concepts without codable children: Secondary | ICD-10-CM

## 2018-01-12 DIAGNOSIS — Z0000 Encounter for general adult medical examination without abnormal findings: Secondary | ICD-10-CM

## 2018-01-12 DIAGNOSIS — Z23 Encounter for immunization: Secondary | ICD-10-CM | POA: Diagnosis not present

## 2018-01-12 DIAGNOSIS — E1165 Type 2 diabetes mellitus with hyperglycemia: Secondary | ICD-10-CM | POA: Diagnosis not present

## 2018-01-12 LAB — LIPID PANEL
CHOLESTEROL: 145 mg/dL (ref 0–200)
HDL: 34.9 mg/dL — AB (ref >39.00)
LDL Cholesterol: 86 mg/dL (ref 0–99)
NonHDL: 110.18
TRIGLYCERIDES: 121 mg/dL (ref 0.0–149.0)
Total CHOL/HDL Ratio: 4
VLDL: 24.2 mg/dL (ref 0.0–40.0)

## 2018-01-12 LAB — COMPREHENSIVE METABOLIC PANEL
ALBUMIN: 4.3 g/dL (ref 3.5–5.2)
ALK PHOS: 54 U/L (ref 39–117)
ALT: 23 U/L (ref 0–53)
AST: 27 U/L (ref 0–37)
BILIRUBIN TOTAL: 0.7 mg/dL (ref 0.2–1.2)
BUN: 18 mg/dL (ref 6–23)
CALCIUM: 9.6 mg/dL (ref 8.4–10.5)
CO2: 26 mEq/L (ref 19–32)
Chloride: 103 mEq/L (ref 96–112)
Creatinine, Ser: 1.32 mg/dL (ref 0.40–1.50)
GFR: 55.82 mL/min — AB (ref >60.00)
Glucose, Bld: 82 mg/dL (ref 70–99)
POTASSIUM: 4.5 meq/L (ref 3.5–5.1)
Sodium: 135 mEq/L (ref 135–145)
TOTAL PROTEIN: 7.4 g/dL (ref 6.0–8.3)

## 2018-01-12 LAB — HEMOGLOBIN A1C: HEMOGLOBIN A1C: 8.2 % — AB (ref 4.6–6.5)

## 2018-01-12 LAB — URIC ACID: Uric Acid, Serum: 7.6 mg/dL (ref 4.0–7.8)

## 2018-01-12 NOTE — Progress Notes (Signed)
Subjective:   Brian Bass is a 77 y.o. male who presents for Medicare Annual/Subsequent preventive examination.  Review of Systems:  N/A Cardiac Risk Factors include: advanced age (>60mn, >>65women);male gender;diabetes mellitus;dyslipidemia;hypertension     Objective:    Vitals: BP 124/80 (BP Location: Right Arm, Patient Position: Sitting, Cuff Size: Normal)   Pulse 73   Temp 97.8 F (36.6 C) (Oral)   Ht 5' 6.5" (1.689 m) Comment: no shoes  Wt 170 lb 8 oz (77.3 kg)   SpO2 98%   BMI 27.11 kg/m   Body mass index is 27.11 kg/m.  Advanced Directives 01/12/2018 07/14/2016 01/11/2016  Does Patient Have a Medical Advance Directive? Yes Yes No  Type of Advance Directive - Living will -  Does patient want to make changes to medical advance directive? Yes (MAU/Ambulatory/Procedural Areas - Information given) No - Patient declined -  Copy of [place] Chart? - No - copy requested -  Would patient like information on creating a medical advance directive? - - No - patient declined information    Tobacco Social History   Tobacco Use  Smoking Status Never Smoker  Smokeless Tobacco Never Used     Counseling given: No   Clinical Intake:  Pre-visit preparation completed: Yes  Pain : No/denies pain Pain Score: 0-No pain     Nutritional Status: BMI 25 -29 Overweight Nutritional Risks: None Diabetes: Yes CBG done?: No Did pt. bring in CBG monitor from home?: No  How often do you need to have someone help you when you read instructions, pamphlets, or other written materials from your doctor or pharmacy?: 1 - Never What is the last grade level you completed in school?: 12th grade + 1 yr college  Interpreter Needed?: No  Comments: pt lives with spouse Information entered by :: LPinson, LPN  Past Medical History:  Diagnosis Date  . Adenomatous colon polyp   . Allergy   . Arthritis   . Complication of anesthesia    Per pt, hard to wake up past  last colon in 2011!  . Diabetes mellitus   . Esophageal stricture   . GERD (gastroesophageal reflux disease)   . History of colon polyps 922/2011  . Hyperlipidemia   . IBS (irritable bowel syndrome)    Past Surgical History:  Procedure Laterality Date  . COLONOSCOPY    . CORONARY ARTERY BYPASS GRAFT  10-2010   3 vessels  . UPPER GASTROINTESTINAL ENDOSCOPY     Family History  Problem Relation Age of Onset  . Heart failure Mother   . Aneurysm Father   . Alcohol abuse Brother   . Colon cancer Cousin   . Esophageal cancer Neg Hx   . Rectal cancer Neg Hx   . Stomach cancer Neg Hx    Social History   Socioeconomic History  . Marital status: Married    Spouse name: None  . Number of children: None  . Years of education: None  . Highest education level: None  Social Needs  . Financial resource strain: None  . Food insecurity - worry: None  . Food insecurity - inability: None  . Transportation needs - medical: None  . Transportation needs - non-medical: None  Occupational History  . 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 Smoking status: Never Smoker  . Smokeless tobacco: Never Used  Substance and Sexual Activity  . Alcohol use: No  . Drug use: No  .  Sexual activity: No  Other Topics Concern  . None  Social History Narrative   Patient does not get regular exercise, but manual labor with farm work   Diet: fruits and veggies, fish, chicken, water        Outpatient Encounter Medications as of 01/12/2018  Medication Sig  . ACCU-CHEK FASTCLIX LANCETS MISC 1 Device by Other route 3 (three) times daily. Check blood sugars 2 to 3 times daily.  Dx: 250.02  . aspirin 81 MG tablet Take 81 mg by mouth daily.  . Blood Glucose Monitoring Suppl (ACCU-CHEK NANO SMARTVIEW) W/DEVICE KIT 1 kit by Other route 3 (three) times daily. Use to check blood sugars 2 to 3 times daily.  Dx: 250.02  . fenofibrate 160 MG tablet Take 1 tablet (160 mg total) by  mouth daily.  [name] glipiZIDE (GLUCOTROL) 10 MG tablet TAKE ONE TABLET BY MOUTH TWICE A DAY BEFORE A MEAL  . glucose blood (ACCU-CHEK SMARTVIEW) test strip USE TO CHECK BLOOD SUGARS 2 TO 3 TIMES DAILY.  Dx: E11.65  . insulin glargine (LANTUS) 100 UNIT/ML injection INJECT 0.55 MLS (55 UNITS TOTAL) SUBCUTANEOUSLY 2 TIMES DAILY.  Dx: E11.65 (Patient taking differently: 100 Units at bedtime. INJECT 0.55 MLS (55 UNITS TOTAL) SUBCUTANEOUSLY 2 TIMES DAILY.  Dx: E11.65)  . Insulin Syringe-Needle U-100 (BD INSULIN SYRINGE ULTRAFINE) 31G X 5/16" 1 ML MISC Use to inject Lantus insulin 2 times daily.  Dx: E11.65  . meloxicam (MOBIC) 15 MG tablet Take 1 tablet (15 mg total) by mouth daily.  . metFORMIN (GLUCOPHAGE) 500 MG tablet TAKE ONE TABLET BY MOUTH TWO TIMES A DAY WITH A MEAL  . Multiple Vitamin (MULTIVITAMIN) tablet Take 1 tablet by mouth daily.    . Omega-3 Fatty Acids (FISH OIL) 1200 MG CAPS Take 1 capsule by mouth daily.  [name] omeprazole (PRILOSEC) 20 MG capsule TAKE 1 CAPSULE EVERY DAY  . simvastatin (ZOCOR) 40 MG tablet TAKE 1 TABLET AT BEDTIME   Facility-Administered Encounter Medications as of 01/12/2018  Medication  . 0.9 %  sodium chloride infusion  . 0.9 %  sodium chloride infusion    Activities of Daily Living In your present state of health, do you have any difficulty performing the following activities: 01/12/2018  Hearing? N  Vision? N  Difficulty concentrating or making decisions? N  Walking or climbing stairs? N  Dressing or bathing? N  Doing errands, shopping? N  Preparing Food and eating ? N  Using the Toilet? N  In the past six months, have you accidently leaked urine? N  Do you have problems with loss of bowel control? N  Managing your Medications? N  Managing your Finances? N  Housekeeping or managing your Housekeeping? N  Some recent data might be hidden    Patient Care Team: Jinny Sanders, MD as PCP - General   Assessment:   This is a routine wellness examination for  Brian Bass.  Exercise Activities and Dietary recommendations Current Exercise Habits: Home exercise routine, Type of exercise: walking, Time (Minutes): 45, Frequency (Times/Week): 7, Weekly Exercise (Minutes/Week): 315, Intensity: Mild, Exercise limited by: None identified  Goals    . Exercise 150 min/wk Moderate Activity     Starting 01/12/2018 and when weather permits, I will continue to walk at least 1 mile daily.        Fall Risk Fall Risk  01/12/2018 07/14/2016 07/14/2015 06/20/2014 11/22/2012  Falls in the past year? Yes No No No No  Comment feet were tangled in cloth causing  fall; fractured ribs - - - -  Number falls in past yr: 1 - - - -  Injury with Fall? Yes - - - -   Depression Screen PHQ 2/9 Scores 01/12/2018 07/14/2016 07/14/2015 06/20/2014  PHQ - 2 Score 0 0 0 0  PHQ- 9 Score 0 - - -    Cognitive Function MMSE - Mini Mental State Exam 01/12/2018 07/14/2016  Orientation to time 5 5  Orientation to Place 5 5  Registration 3 3  Attention/ Calculation 0 0  Recall 0 3  Recall-comments unable to recall 3 of 3 words -  Language- name 2 objects 0 0  Language- repeat 1 1  Language- follow 3 step command 3 3  Language- read & follow direction 0 0  Write a sentence 0 0  Copy design 0 0  Total score 17 20     PLEASE NOTE: A Mini-Cog screen was completed. Maximum score is 20. A value of 0 denotes this part of Folstein MMSE was not completed or the patient failed this part of the Mini-Cog screening.   Mini-Cog Screening Orientation to Time - Max 5 pts Orientation to Place - Max 5 pts Registration - Max 3 pts Recall - Max 3 pts Language Repeat - Max 1 pts Language Follow 3 Step Command - Max 3 pts     Immunization History  Administered Date(s) Administered  . Influenza Split 10/05/2011, 10/11/2012  . Influenza Whole 11/12/2010  . Influenza,inj,Quad PF,6+ Mos 09/06/2013, 09/24/2014, 10/02/2015, 09/13/2016, 09/29/2017  . Pneumococcal Conjugate-13 01/12/2018  . Pneumococcal  Polysaccharide-23 12/06/2007  . Td 08/10/2010    Screening Tests Health Maintenance  Topic Date Due  . HEMOGLOBIN A1C  01/19/2018  . FOOT EXAM  01/20/2018  . OPHTHALMOLOGY EXAM  07/05/2018  . URINE MICROALBUMIN  07/19/2018  . COLONOSCOPY  01/10/2019  . TETANUS/TDAP  08/10/2020  . INFLUENZA VACCINE  Completed  . PNA vac Low Risk Adult  Completed      Plan:     I have personally reviewed, addressed, and noted the following in the patient's chart:  A. Medical and social history B. Use of alcohol, tobacco or illicit drugs  C. Current medications and supplements D. Functional ability and status E.  Nutritional status F.  Physical activity G. Advance directives H. List of other physicians I.  Hospitalizations, surgeries, and ER visits in previous 12 months J.  [place] to include hearing, vision, cognitive, depression L. Referrals and appointments - none  In addition, I have reviewed and discussed with patient certain preventive protocols, quality metrics, and best practice recommendations. A written personalized care plan for preventive services as well as general preventive health recommendations were provided to patient.  See attached scanned questionnaire for additional information.   Signed,   Lindell Noe, MHA, BS, LPN Health Coach

## 2018-01-12 NOTE — Progress Notes (Signed)
Pre visit review using our clinic review tool, if applicable. No additional management support is needed unless otherwise documented below in the visit note.

## 2018-01-12 NOTE — Patient Instructions (Addendum)
Brian Bass , Thank you for taking time to come for your Medicare Wellness Visit. I appreciate your ongoing commitment to your health goals. Please review the following plan we discussed and let me know if I can assist you in the future.   These are the goals we discussed: Goals    . Exercise 150 min/wk Moderate Activity     Starting 01/12/2018 and when weather permits, I will continue to walk at least 1 mile daily.        This is a list of the screening recommended for you and due dates:  Health Maintenance  Topic Date Due  . Hemoglobin A1C  01/19/2018  . Complete foot exam   01/20/2018  . Eye exam for diabetics  07/05/2018  . Urine Protein Check  07/19/2018  . Colon Cancer Screening  01/10/2019  . Tetanus Vaccine  08/10/2020  . Flu Shot  Completed  . Pneumonia vaccines  Completed   Preventive Care for Adults  A healthy lifestyle and preventive care can promote health and wellness. Preventive health guidelines for adults include the following key practices.  . A routine yearly physical is a good way to check with your health care provider about your health and preventive screening. It is a chance to share any concerns and updates on your health and to receive a thorough exam.  . Visit your dentist for a routine exam and preventive care every 6 months. Brush your teeth twice a day and floss once a day. Good oral hygiene prevents tooth decay and gum disease.  . The frequency of eye exams is based on your age, health, family medical history, use  of contact lenses, and other factors. Follow your health care provider's recommendations for frequency of eye exams.  . Eat a healthy diet. Foods like vegetables, fruits, whole grains, low-fat dairy products, and lean protein foods contain the nutrients you need without too many calories. Decrease your intake of foods high in solid fats, added sugars, and salt. Eat the right amount of calories for you. Get information about a proper diet from  your health care provider, if necessary.  . Regular physical exercise is one of the most important things you can do for your health. Most adults should get at least 150 minutes of moderate-intensity exercise (any activity that increases your heart rate and causes you to sweat) each week. In addition, most adults need muscle-strengthening exercises on 2 or more days a week.  Silver Sneakers may be a benefit available to you. To determine eligibility, you may visit the website: www.silversneakers.com or contact program at 763-732-0500 Mon-Fri between 8AM-8PM.   . Maintain a healthy weight. The body mass index (BMI) is a screening tool to identify possible weight problems. It provides an estimate of body fat based on height and weight. Your health care provider can find your BMI and can help you achieve or maintain a healthy weight.   For adults 20 years and older: ? A BMI below 18.5 is considered underweight. ? A BMI of 18.5 to 24.9 is normal. ? A BMI of 25 to 29.9 is considered overweight. ? A BMI of 30 and above is considered obese.   . Maintain normal blood lipids and cholesterol levels by exercising and minimizing your intake of saturated fat. Eat a balanced diet with plenty of fruit and vegetables. Blood tests for lipids and cholesterol should begin at age 36 and be repeated every 5 years. If your lipid or cholesterol levels are high, you  are over 71, or you are at high risk for heart disease, you may need your cholesterol levels checked more frequently. Ongoing high lipid and cholesterol levels should be treated with medicines if diet and exercise are not working.  . If you smoke, find out from your health care provider how to quit. If you do not use tobacco, please do not start.  . If you choose to drink alcohol, please do not consume more than 2 drinks per day. One drink is considered to be 12 ounces (355 mL) of beer, 5 ounces (148 mL) of wine, or 1.5 ounces (44 mL) of liquor.  . If you  are 69-71 years old, ask your health care provider if you should take aspirin to prevent strokes.  . Use sunscreen. Apply sunscreen liberally and repeatedly throughout the day. You should seek shade when your shadow is shorter than you. Protect yourself by wearing long sleeves, pants, a wide-brimmed hat, and sunglasses year round, whenever you are outdoors.  . Once a month, do a whole body skin exam, using a mirror to look at the skin on your back. Tell your health care provider of new moles, moles that have irregular borders, moles that are larger than a pencil eraser, or moles that have changed in shape or color.

## 2018-01-12 NOTE — Progress Notes (Signed)
PCP notes:   Health maintenance:  PCV13 - administered  Abnormal screenings:    Fall risk - hx of single fall Fall Risk  01/12/2018 07/14/2016 07/14/2015 06/20/2014 11/22/2012  Falls in the past year? Yes No No No No  Comment feet were tangled in cloth causing fall; fractured ribs - - - -  Number falls in past yr: 1 - - - -  Injury with Fall? Yes - - - -   Mini-Cog score: 17/20 MMSE - Mini Mental State Exam 01/12/2018 07/14/2016  Orientation to time 5 5  Orientation to Place 5 5  Registration 3 3  Attention/ Calculation 0 0  Recall 0 3  Recall-comments unable to recall 3 of 3 words -  Language- name 2 objects 0 0  Language- repeat 1 1  Language- follow 3 step command 3 3  Language- read & follow direction 0 0  Write a sentence 0 0  Copy design 0 0  Total score 17 20       Patient concerns:   Sleep management - pt verbalized concerns with frequent waking during night. Pt states he wakes up 2-3 times per night.  Nurse concerns:  None  Next PCP appt:   01/19/2018 @ 1000

## 2018-01-19 ENCOUNTER — Ambulatory Visit (INDEPENDENT_AMBULATORY_CARE_PROVIDER_SITE_OTHER): Payer: Medicare HMO | Admitting: Family Medicine

## 2018-01-19 ENCOUNTER — Encounter: Payer: Self-pay | Admitting: Family Medicine

## 2018-01-19 ENCOUNTER — Other Ambulatory Visit: Payer: Self-pay

## 2018-01-19 VITALS — BP 136/86 | HR 74 | Temp 97.5°F | Ht 66.5 in | Wt 174.8 lb

## 2018-01-19 DIAGNOSIS — E7800 Pure hypercholesterolemia, unspecified: Secondary | ICD-10-CM

## 2018-01-19 DIAGNOSIS — R351 Nocturia: Secondary | ICD-10-CM

## 2018-01-19 DIAGNOSIS — E1165 Type 2 diabetes mellitus with hyperglycemia: Secondary | ICD-10-CM | POA: Diagnosis not present

## 2018-01-19 DIAGNOSIS — Z0001 Encounter for general adult medical examination with abnormal findings: Secondary | ICD-10-CM | POA: Diagnosis not present

## 2018-01-19 DIAGNOSIS — I10 Essential (primary) hypertension: Secondary | ICD-10-CM

## 2018-01-19 DIAGNOSIS — N182 Chronic kidney disease, stage 2 (mild): Secondary | ICD-10-CM

## 2018-01-19 DIAGNOSIS — M1049 Other secondary gout, multiple sites: Secondary | ICD-10-CM

## 2018-01-19 DIAGNOSIS — N400 Enlarged prostate without lower urinary tract symptoms: Secondary | ICD-10-CM

## 2018-01-19 DIAGNOSIS — IMO0001 Reserved for inherently not codable concepts without codable children: Secondary | ICD-10-CM

## 2018-01-19 DIAGNOSIS — Z0000 Encounter for general adult medical examination without abnormal findings: Secondary | ICD-10-CM

## 2018-01-19 LAB — HM DIABETES FOOT EXAM

## 2018-01-19 MED ORDER — TAMSULOSIN HCL 0.4 MG PO CAPS: ORAL_CAPSULE | ORAL | 3 refills | Status: DC

## 2018-01-19 MED ORDER — COLCHICINE 0.6 MG PO TABS: ORAL_TABLET | ORAL | 0 refills | Status: DC

## 2018-01-19 NOTE — Assessment & Plan Note (Signed)
Will check prostate exam and consider PSA free and total.  Restart flomax.

## 2018-01-19 NOTE — Progress Notes (Signed)
I reviewed health advisor's note, was available for consultation, and agree with documentation and plan.

## 2018-01-19 NOTE — Progress Notes (Signed)
Subjective:    Patient ID: Brian Bass, male    DOB: 29-Jan-1941, 77 y.o.   MRN: [id number]  HPI  The patient presents for  complete physical and review of chronic health problems. He/She also has the following acute concerns today: nocturia, dyspnea on exertion for last year, gout flares in left knee.. Needs med to treat  The patient saw Candis Musa, LPN for medicare wellness. Note reviewed in detail and important notes copied below. Health maintenance:  PCV13 - administered  Abnormal screenings:    Fall risk - hx of single fall Fall Risk  01/12/2018 07/14/2016 07/14/2015 06/20/2014 11/22/2012  Falls in the past year? Yes No No No No  Comment feet were tangled in cloth causing fall; fractured ribs - - - -  Number falls in past yr: 1 - - - -  Injury with Fall? Yes - - - -   Mini-Cog score: 17/20 MMSE - Mini Mental State Exam 01/12/2018 07/14/2016  Orientation to time 5 5  Orientation to Place 5 5  Registration 3 3  Attention/ Calculation 0 0  Recall 0 3  Recall-comments unable to recall 3 of 3 words -  Language- name 2 objects 0 0  Language- repeat 1 1  Language- follow 3 step command 3 3  Language- read & follow direction 0 0  Write a sentence 0 0  Copy design 0 0  Total score 17 20   Patient concerns:   Sleep management - pt verbalized concerns with frequent waking during night. Pt states he wakes up 2-3 times per night.   01/19/18 Today   Gout : 2-3 flares in last year in left knee likely, no redness but very sore to touch. Uric acid 7.6 Lab Results  Component Value Date   LABURIC 7.6 01/12/2018     Diabetes:   Inadequate control on glipizide, metformin and insulin.   Lab Results  Component Value Date   HGBA1C 8.2 (H) 01/12/2018  Using medications without difficulties: Hypoglycemic episodes: Hyperglycemic episodes: Feet problems: Blood Sugars averaging: eye exam within last year:  Hypertension:    Good control  BP Readings from Last 3  Encounters:  01/19/18 136/86  01/12/18 124/80  11/20/17 130/70  Using medication without problems or lightheadedness:  Chest pain with exertion: Edema: Short of breath: Average home BPs: Other issues:   Elevated Cholesterol:  LDL almost at goal < 70  On statin and fenofibrate. Lab Results  Component Value Date   CHOL 145 01/12/2018   HDL 34.90 (L) 01/12/2018   LDLCALC 86 01/12/2018   LDLDIRECT 45.0 01/16/2017   TRIG 121.0 01/12/2018   CHOLHDL 4 01/12/2018  using medications without problems: Muscle aches:  Diet compliance: Exercise: Other complaints:   CKD:   Lab Results  Component Value Date   CREATININE 1.32 01/12/2018     In last 2-3 years he has nocturia 2-3 times a night.  Dribbles at night.  no issues during the day. No family history of prostate cancer.  Social History /Family History/Past Medical History reviewed in detail and updated in EMR if needed. Blood pressure 136/86, pulse 74, temperature (!) 97.5 F (36.4 C), temperature source Oral, height 5' 6.5" (1.689 m), weight 174 lb 12 oz (79.3 kg).  Review of Systems  Constitutional: Negative for fatigue and fever.  HENT: Negative for ear pain.   Eyes: Negative for pain.  Respiratory: Negative for cough and shortness of breath.   Cardiovascular: Negative for chest pain, palpitations and leg swelling.  Gastrointestinal: Negative for abdominal pain.  Genitourinary: Negative for dysuria.  Musculoskeletal: Negative for arthralgias.  Neurological: Negative for syncope, light-headedness and headaches.  Psychiatric/Behavioral: Negative for dysphoric mood.       Objective:   Physical Exam  Constitutional: He appears well-developed and well-nourished.  Non-toxic appearance. He does not appear ill. No distress.  HENT:  Head: Normocephalic and atraumatic.  Right Ear: Hearing, tympanic membrane, external ear and ear canal normal.  Left Ear: Hearing, tympanic membrane, external ear and ear canal normal.    Nose: Nose normal.  Mouth/Throat: Uvula is midline, oropharynx is clear and moist and mucous membranes are normal.  Eyes: Conjunctivae, EOM and lids are normal. Pupils are equal, round, and reactive to light. Lids are everted and swept, no foreign bodies found.  Neck: Trachea normal, normal range of motion and phonation normal. Neck supple. Carotid bruit is not present. No thyroid mass and no thyromegaly present.  Cardiovascular: Normal rate, regular rhythm, S1 normal, S2 normal, intact distal pulses and normal pulses. Exam reveals no gallop.  No murmur heard. Pulmonary/Chest: Breath sounds normal. He has no wheezes. He has no rhonchi. He has no rales.  Abdominal: Soft. Normal appearance and bowel sounds are normal. There is no hepatosplenomegaly. There is no tenderness. There is no rebound, no guarding and no CVA tenderness. No hernia.  Genitourinary: Prostate is enlarged.  Genitourinary Comments: homogenously enlarged prostate  Lymphadenopathy:    He has no cervical adenopathy.  Neurological: He is alert. He has normal strength and normal reflexes. No cranial nerve deficit or sensory deficit. Gait normal.  Skin: Skin is warm, dry and intact. No rash noted.  Psychiatric: He has a normal mood and affect. His speech is normal and behavior is normal. Judgment normal.     Diabetic foot exam: Normal inspection No skin breakdown No calluses  Normal DP pulses Normal sensation to light touch and monofilament Nails normal      Assessment & Plan:  The patient's preventative maintenance and recommended screening tests for an annual wellness exam were reviewed in full today. Brought up to date unless services declined.  Counselled on the importance of diet, exercise, and its role in overall health and mortality. The patient's FH and SH was reviewed, including their home life, tobacco status, and drug and alcohol status.

## 2018-01-19 NOTE — Patient Instructions (Addendum)
Work on low uric acid/ low purine diet.  Can use colchicine for gout flares.  Work on low [employer].  Take insulin to 55 units twice daily. Increase exercise.  Please stop at the lab to have labs drawn.     Low-Purine Diet Purines are compounds that affect the level of uric acid in your body. A low-purine diet is a diet that is low in purines. Eating a low-purine diet can prevent the level of uric acid in your body from getting too high and causing gout or kidney stones or both. What do I need to know about this diet?  Choose low-purine foods. Examples of low-purine foods are listed in the next section.  Drink plenty of fluids, especially water. Fluids can help remove uric acid from your body. Try to drink 8-16 cups (1.9-3.8 L) a day.  Limit foods high in fat, especially saturated fat, as fat makes it harder for the body to get rid of uric acid. Foods high in saturated fat include pizza, cheese, ice cream, whole milk, fried foods, and gravies. Choose foods that are lower in fat and lean sources of protein. Use olive oil when cooking as it contains healthy fats that are not high in saturated fat.  Limit alcohol. Alcohol interferes with the elimination of uric acid from your body. If you are having a gout attack, avoid all alcohol.  Keep in mind that different people's bodies react differently to different foods. You will probably learn over time which foods do or do not affect you. If you discover that a food tends to cause your gout to flare up, avoid eating that food. You can more freely enjoy foods that do not cause problems. If you have any questions about a food item, talk to your dietitian or health care provider. Which foods are low, moderate, and high in purines? The following is a list of foods that are low, moderate, and high in purines. You can eat any amount of the foods that are low in purines. You may be able to have small amounts of foods that are moderate in purines. Ask your  health care provider how much of a food moderate in purines you can have. Avoid foods high in purines. Grains  Foods low in purines: Enriched white bread, pasta, rice, cake, cornbread, popcorn.  Foods moderate in purines: Whole-grain breads and cereals, wheat germ, bran, oatmeal. Uncooked oatmeal. Dry wheat bran or wheat germ.  Foods high in purines: Pancakes, [place] toast, biscuits, muffins. Vegetables  Foods low in purines: All vegetables, except those that are moderate in purines.  Foods moderate in purines: Asparagus, cauliflower, spinach, mushrooms, green peas. Fruits  All fruits are low in purines. Meats and other Protein Foods  Foods low in purines: Eggs, nuts, peanut butter.  Foods moderate in purines: 80-90% lean beef, lamb, veal, pork, poultry, fish, eggs, peanut butter, nuts. Crab, lobster, oysters, and shrimp. Cooked dried beans, peas, and lentils.  Foods high in purines: Anchovies, sardines, herring, mussels, tuna, codfish, scallops, trout, and haddock. Berniece Salines. Organ meats (such as liver or kidney). Tripe. Game meat. Goose. Sweetbreads. Dairy  All dairy foods are low in purines. Low-fat and fat-free dairy products are best because they are low in saturated fat. Beverages  Drinks low in purines: Water, carbonated beverages, tea, coffee, cocoa.  Drinks moderate in purines: Soft drinks and other drinks sweetened with high-fructose corn syrup. Juices. To find whether a food or drink is sweetened with high-fructose corn syrup, look at the  ingredients list.  Drinks high in purines: Alcoholic beverages (such as beer). Condiments  Foods low in purines: Salt, herbs, olives, pickles, relishes, vinegar.  Foods moderate in purines: Butter, margarine, oils, mayonnaise. Fats and Oils  Foods low in purines: All types, except gravies and sauces made with meat.  Foods high in purines: Gravies and sauces made with meat. Other Foods  Foods low in purines: Sugars, sweets,  gelatin. Cake. Soups made without meat.  Foods moderate in purines: Meat-based or fish-based soups, broths, or bouillons. Foods and drinks sweetened with high-fructose corn syrup.  Foods high in purines: High-fat desserts (such as ice cream, cookies, cakes, pies, doughnuts, and chocolate). Contact your dietitian for more information on foods that are not listed here. This information is not intended to replace advice given to you by your health care provider. Make sure you discuss any questions you have with your health care provider. Document Released: 03/18/2011 Document Revised: 04/28/2016 Document Reviewed: 10/28/2013 Elsevier Interactive Patient Education  2017 [employer].

## 2018-01-25 ENCOUNTER — Other Ambulatory Visit (INDEPENDENT_AMBULATORY_CARE_PROVIDER_SITE_OTHER): Payer: Medicare HMO

## 2018-01-25 DIAGNOSIS — N400 Enlarged prostate without lower urinary tract symptoms: Secondary | ICD-10-CM | POA: Diagnosis not present

## 2018-01-25 DIAGNOSIS — R351 Nocturia: Secondary | ICD-10-CM

## 2018-01-29 LAB — PSA, TOTAL AND FREE
PSA, % FREE: 10 % — AB (ref >25)
PSA, Free: 0.6 ng/mL
PSA, Total: 5.9 ng/mL — ABNORMAL HIGH (ref <=4.0)

## 2018-01-30 ENCOUNTER — Telehealth: Payer: Self-pay | Admitting: *Deleted

## 2018-01-30 NOTE — Telephone Encounter (Signed)
Brian Bass was here today for lab work and wanted to make sure Dr. Randa Spike know that [name] has had a cough x 3 months that she is really worried about.  The cough started about 3 months ago when he fractured his ribs.  He is scheduled to see Dr. Diona Browner on 02/08/2018 for dyspnea on exertion.  She just wanted to make Dr. Diona Browner aware in case she wants to do a chest x-ray at that visit.

## 2018-01-30 NOTE — Telephone Encounter (Signed)
Noted.

## 2018-02-06 ENCOUNTER — Ambulatory Visit: Payer: Medicare HMO | Admitting: Family Medicine

## 2018-02-07 NOTE — Assessment & Plan Note (Signed)
Well controlled. Continue current medication.  

## 2018-02-07 NOTE — Assessment & Plan Note (Signed)
LDL almost at goal < 70  On statin and fenofibrate.

## 2018-02-07 NOTE — Assessment & Plan Note (Signed)
Discussed treatment options.  Low purine diet.  Start colchicine for flare.

## 2018-02-07 NOTE — Assessment & Plan Note (Signed)
Inadequate control.. Discussed diet and lifestyle changes. Offered referral to ENDO.  Will try to take recommended dose of insulin.

## 2018-02-08 ENCOUNTER — Ambulatory Visit (INDEPENDENT_AMBULATORY_CARE_PROVIDER_SITE_OTHER)
Admission: RE | Admit: 2018-02-08 | Discharge: 2018-02-08 | Disposition: A | Discharge status: Home / Self Care | Payer: Medicare HMO | Source: Ambulatory Visit | Attending: Family Medicine | Admitting: Family Medicine

## 2018-02-08 ENCOUNTER — Other Ambulatory Visit: Payer: Self-pay

## 2018-02-08 ENCOUNTER — Ambulatory Visit (INDEPENDENT_AMBULATORY_CARE_PROVIDER_SITE_OTHER): Payer: Medicare HMO | Admitting: Family Medicine

## 2018-02-08 ENCOUNTER — Encounter: Payer: Self-pay | Admitting: Family Medicine

## 2018-02-08 VITALS — BP 110/60 | HR 77 | Temp 97.6°F | Ht 66.5 in | Wt 176.5 lb

## 2018-02-08 DIAGNOSIS — R351 Nocturia: Secondary | ICD-10-CM | POA: Diagnosis not present

## 2018-02-08 DIAGNOSIS — R0609 Other forms of dyspnea: Secondary | ICD-10-CM

## 2018-02-08 DIAGNOSIS — E1122 Type 2 diabetes mellitus with diabetic chronic kidney disease: Secondary | ICD-10-CM | POA: Diagnosis not present

## 2018-02-08 DIAGNOSIS — I2510 Atherosclerotic heart disease of native coronary artery without angina pectoris: Secondary | ICD-10-CM

## 2018-02-08 DIAGNOSIS — N183 Chronic kidney disease, stage 3 unspecified: Secondary | ICD-10-CM

## 2018-02-08 DIAGNOSIS — J849 Interstitial pulmonary disease, unspecified: Secondary | ICD-10-CM

## 2018-02-08 DIAGNOSIS — N401 Enlarged prostate with lower urinary tract symptoms: Secondary | ICD-10-CM

## 2018-02-08 DIAGNOSIS — Z794 Long term (current) use of insulin: Secondary | ICD-10-CM | POA: Diagnosis not present

## 2018-02-08 LAB — CBC WITH DIFFERENTIAL/PLATELET
BASOS ABS: 0 10*3/uL (ref 0.0–0.1)
Basophils Relative: 0.3 % (ref 0.0–3.0)
EOS ABS: 0.2 10*3/uL (ref 0.0–0.7)
EOS PCT: 1.6 % (ref 0.0–5.0)
HCT: 40.9 % (ref 39.0–52.0)
HEMOGLOBIN: 13.8 g/dL (ref 13.0–17.0)
LYMPHS ABS: 2.6 10*3/uL (ref 0.7–4.0)
Lymphocytes Relative: 25.8 % (ref 12.0–46.0)
MCHC: 33.9 g/dL (ref 30.0–36.0)
MCV: 87.8 fl (ref 78.0–100.0)
MONO ABS: 1.1 10*3/uL — AB (ref 0.1–1.0)
Monocytes Relative: 10.6 % (ref 3.0–12.0)
NEUTROS PCT: 61.7 % (ref 43.0–77.0)
Neutro Abs: 6.3 10*3/uL (ref 1.4–7.7)
Platelets: 228 10*3/uL (ref 150.0–400.0)
RBC: 4.65 Mil/uL (ref 4.22–5.81)
RDW: 13.3 % (ref 11.5–15.5)
WBC: 10.2 10*3/uL (ref 4.0–10.5)

## 2018-02-08 NOTE — Assessment & Plan Note (Signed)
Liekly multifactorial but concerning for progression of CAD in setting of poorly controlled DM. EKG today unremarkable. Given risk level will refer to cardiology for  likely stress test vs cath to eval,  No sign of fluid overload or CHF.   May also be due to progression of chronic lung disease.  CXR today showed no new infection or pulmonary edema.  Will refer to pulm for further eval.    Recent labs nml.. Will rule out anemia as cause with cbc today.

## 2018-02-08 NOTE — Assessment & Plan Note (Signed)
BPH homogenous on prostate exam at last OV. Likely BPH cause of slightly elevated PSA although given free% low.. Repeat PSA and free in 3 months.  If stable and symptoms improved with flomax.. Pt wishes to hold off on further urology eval.

## 2018-02-08 NOTE — Progress Notes (Signed)
   Subjective:    Patient ID: Brian Bass, male    DOB: [birth year], 77 y.o.   MRN: [id number]  HPI   77 year old male with history of  HTN,  Diffuse parenchymal lung disease,  CAD, S/P CABG x 3, poorly controlled DM presents with worsening dyspnea on exertion in last year.  No SOB at rest, no wheeze. No chest pain. No peripheral edema. No unexpected weight gain.  He does feel more tired overall in last 3 months.   Fell accidental in 11/2017  and broke ribs on   Left side.. No CXR. Pain resolved after 6 weeks. Since then he has had  moist cough, no production. No fever.    Nocturia: flomax has helped some.. Only taking    In past pt was not bothered by SOB so did not want to see pulmonary when dx with chronic interstitial lung disease  2014.   2014 chest CT showed: Chronic interstitial lung disease. Findings are concerning for usual interstitial pneumonitis. Confirmatory imaging with high-resolution CT of the chest may be helpful. 3. Borderline enlarged mediastinal lymph nodes are favored to be reactive and nonspecific in the setting of ILD.  Blood pressure 110/60, pulse 77, temperature 97.6 F (36.4 C), temperature source Oral, height 5' 6.5" (1.689 m), weight 176 lb 8 oz (80.1 kg), SpO2 97 %.   Review of Systems  Constitutional: Negative for fatigue and fever.  HENT: Negative for ear pain.   Eyes: Negative for pain.  Respiratory: Negative for cough and shortness of breath.   Cardiovascular: Negative for chest pain, palpitations and leg swelling.  Gastrointestinal: Negative for abdominal pain.  Genitourinary: Negative for dysuria.  Musculoskeletal: Negative for arthralgias.  Neurological: Negative for syncope, light-headedness and headaches.  Psychiatric/Behavioral: Negative for dysphoric mood.       Objective:   Physical Exam  Constitutional: Vital signs are normal. He appears well-developed and well-nourished.  HENT:  Head: Normocephalic.  Right Ear:  Hearing normal.  Left Ear: Hearing normal.  Nose: Nose normal.  Mouth/Throat: Oropharynx is clear and moist and mucous membranes are normal.  Neck: Trachea normal. Carotid bruit is not present. No thyroid mass and no thyromegaly present.  Cardiovascular: Normal rate, regular rhythm and normal pulses. Exam reveals no gallop, no distant heart sounds and no friction rub.  No murmur heard. No peripheral edema  Pulmonary/Chest: Effort normal and breath sounds normal. No respiratory distress.  Skin: Skin is warm, dry and intact. No rash noted.  Pale,  decreased capillary return  Psychiatric: He has a normal mood and affect. His speech is normal and behavior is normal. Thought content normal.          Assessment & Plan:

## 2018-02-08 NOTE — Assessment & Plan Note (Signed)
Pt has been asymptomatic in past and has refused further eval/referral given he flet well.  now new symptoms possibly due to ILD.Brian Bass Will refer to pulm for further eval, possible repeat Chest CT.

## 2018-02-08 NOTE — Patient Instructions (Addendum)
Please stop at the front desk to set up referral to cardiology and pulmonary.  We will call with lab results.  Increase flomax to 2 tabs daily.. We will re-evaluate prostate at next blood test and follow up in 3 months.

## 2018-02-12 ENCOUNTER — Institutional Professional Consult (permissible substitution): Payer: Medicare HMO | Admitting: Internal Medicine

## 2018-02-13 ENCOUNTER — Encounter: Payer: Self-pay | Admitting: Internal Medicine

## 2018-02-13 ENCOUNTER — Ambulatory Visit: Payer: Medicare HMO | Admitting: Internal Medicine

## 2018-02-13 DIAGNOSIS — J849 Interstitial pulmonary disease, unspecified: Secondary | ICD-10-CM | POA: Diagnosis not present

## 2018-02-13 NOTE — Progress Notes (Signed)
[facility] Consultation      Assessment and Plan:  Interstitial lung disease-suspect idiopathic pulmonary fibrosis. - Discussed with patient and wife in regards to what the diagnosis of pulmonary fibrosis is, and what testing needs to be done for confirmation.  Also discussed that if this is truly pulmonary fibrosis, we can expect the patient's respiratory status to continue to decline. He may benefit from newer medications which can help maintain lung function.  -We will order pulmonary function test, CT high resolution, serological testing.  Coronary artery disease. - Patient reports current symptoms are similar to when he developed cardiac symptoms and then required triple bypass, therefore recurrent coronary artery disease is concerning, needs to be ruled out. -Patient has a follow-up sometime next month in cardiology, for evaluation we will see if we can move this evaluation sooner.  Orders Placed This Encounter  Procedures  . CT CHEST HIGH RESOLUTION  . Rheumatoid Factor  . Angiotensin converting enzyme  . ANA+ENA+DNA/DS+Scl 70+SjoSSA/B  . Pulmonary Function Test [facility] Only   Return in about 3 weeks (around 03/06/2018).   Date: 02/13/2018  MRN# [id number] THEODUS RAN 1941-01-28  Referring Physician: Dr. Eben Burow is a 77 y.o. old male seen in consultation for chief complaint of:    Chief Complaint  Patient presents with  . Consult    ILD: SOB w/activity: NP cough:     HPI:   The patient is a 77 year old male with a history of interstitial lung disease.He walks his dog about every day and notes that when he gets to the top of hill he has to stop. He broke his ribs about 8 weeks ago after a fall and since then has developed a cough. He is present with his wife who gives most of the history, nearly all of the history.  He also gets dyspnea when carrying groceries in. This has been going on for about 6 months to a years and has  been getting progressively worse. He is active outside, he does work in Biomedical engineer, he walks his dog, he has been remodeling a house in [place].  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 He has never smoked his wife smoked for 15 years.     Imaging personally reviewed, chest x-ray 02/08/18; diffuse groundglass and interstitial changes throughout both lungs, right diaphragm is elevated.  Interstitial changes has progressed significantly since previous chest x-ray on 09/06/13.  CT chest 10/21/13, bilateral emphysematous changes, bilateral subpleural reticular changes consistent with pulmonary fibrosis   PMHX:   Past Medical History:  Diagnosis Date  . Adenomatous colon polyp   . Allergy   . Arthritis   . Complication of anesthesia    Per pt, hard to wake up past last colon in 2011!  . Diabetes mellitus   . Esophageal stricture   . GERD (gastroesophageal reflux disease)   . History of colon polyps 922/2011  . Hyperlipidemia   . IBS (irritable bowel syndrome)    Surgical Hx:  Past Surgical History:  Procedure Laterality Date  . COLONOSCOPY    . CORONARY ARTERY BYPASS GRAFT  10-2010   3 vessels  . UPPER GASTROINTESTINAL ENDOSCOPY     Family Hx:  Family History  Problem Relation Age of Onset  . Heart failure Mother   . Aneurysm Father   . Alcohol abuse Brother   .  Colon cancer Cousin   . Esophageal cancer Neg Hx   . Rectal cancer Neg Hx   . Stomach cancer Neg Hx    Social Hx:   Social History   Tobacco Use  . Smoking status: Never Smoker  . Smokeless tobacco: Never Used  Substance Use Topics  . Alcohol use: No  . Drug use: No   Medication:    Current Outpatient Medications:  .  ACCU-CHEK FASTCLIX LANCETS MISC, 1 Device by Other route 3 (three) times daily. Check blood sugars 2 to 3 times daily.  Dx: 250.02, Disp: 102 each, Rfl: 5 .  aspirin 81 MG tablet, Take 81  mg by mouth daily., Disp: , Rfl:  .  Blood Glucose Monitoring Suppl (ACCU-CHEK NANO SMARTVIEW) W/DEVICE KIT, 1 kit by Other route 3 (three) times daily. Use to check blood sugars 2 to 3 times daily.  Dx: 250.02, Disp: 1 kit, Rfl: 0 .  colchicine 0.6 MG tablet, For flare take 2 tab x 1, repeat 1 tabslet in 2 hours if pain continues, then one tablet daily, Disp: 60 tablet, Rfl: 0 .  fenofibrate 160 MG tablet, Take 1 tablet (160 mg total) by mouth daily., Disp: 90 tablet, Rfl: 1 .  glipiZIDE (GLUCOTROL) 10 MG tablet, TAKE ONE TABLET BY MOUTH TWICE A DAY BEFORE A MEAL, Disp: 180 tablet, Rfl: 1 .  glucose blood (ACCU-CHEK SMARTVIEW) test strip, USE TO CHECK BLOOD SUGARS 2 TO 3 TIMES DAILY.  Dx: E11.65, Disp: 300 each, Rfl: 3 .  insulin glargine (LANTUS) 100 UNIT/ML injection, INJECT 0.55 MLS (55 UNITS TOTAL) SUBCUTANEOUSLY 2 TIMES DAILY.  Dx: E11.65 (Patient taking differently: 100 Units at bedtime. INJECT 0.55 MLS (55 UNITS TOTAL) SUBCUTANEOUSLY 2 TIMES DAILY.  Dx: E11.65), Disp: 100 mL, Rfl: 3 .  Insulin Syringe-Needle U-100 (BD INSULIN SYRINGE ULTRAFINE) 31G X 5/16" 1 ML MISC, Use to inject Lantus insulin 2 times daily.  Dx: E11.65, Disp: 200 each, Rfl: 3 .  meloxicam (MOBIC) 15 MG tablet, Take 1 tablet (15 mg total) by mouth daily., Disp: 90 tablet, Rfl: 1 .  metFORMIN (GLUCOPHAGE) 500 MG tablet, TAKE ONE TABLET BY MOUTH TWO TIMES A DAY WITH A MEAL, Disp: 180 tablet, Rfl: 3 .  Multiple Vitamin (MULTIVITAMIN) tablet, Take 1 tablet by mouth daily.  , Disp: , Rfl:  .  Omega-3 Fatty Acids (FISH OIL) 1200 MG CAPS, Take 1 capsule by mouth daily., Disp: , Rfl:  .  omeprazole (PRILOSEC) 20 MG capsule, TAKE 1 CAPSULE EVERY DAY, Disp: 90 capsule, Rfl: 3 .  simvastatin (ZOCOR) 40 MG tablet, TAKE 1 TABLET AT BEDTIME, Disp: 90 tablet, Rfl: 3 .  tamsulosin (FLOMAX) 0.4 MG CAPS capsule, 1 tablet daily, if not improving increase to 2 tabs daily., Disp: 60 capsule, Rfl: 3  Current Facility-Administered Medications:    .  0.9 %  sodium chloride infusion, 500 mL, Intravenous, Continuous, Pyrtle, Lajuan Lines, MD .  0.9 %  sodium chloride infusion, 500 mL, Intravenous, Continuous, Pyrtle, Lajuan Lines, MD   Allergies:  Patient has no known allergies.  Review of Systems: Gen:  Denies  fever, sweats, chills HEENT: Denies blurred vision, double vision. bleeds, sore throat Cvc:  No dizziness, chest pain. Resp:   Denies cough or sputum production, shortness of breath Gi: Denies swallowing difficulty, stomach pain. Gu:  Denies bladder incontinence, burning urine Ext:   No Joint pain, stiffness. Skin: No skin rash,  hives  Endoc:  No polyuria, polydipsia. Psych: No depression, insomnia. Other:  All other systems were reviewed with the patient and were negative other that what is mentioned in the HPI.   Physical Examination:   VS: BP 118/82 (BP Location: Right Arm, Cuff Size: Normal)   Pulse 80   Ht 5' 6.5" (1.689 m)   Wt 175 lb (79.4 kg)   SpO2 99%   BMI 27.82 kg/m   General Appearance: No distress  Neuro:without focal findings,  speech normal,  HEENT: PERRLA, EOM intact.   Pulmonary: bilateral basal crackles, No wheezing.  CardiovascularNormal S1,S2.  No m/r/g.   Abdomen: Benign, Soft, non-tender. Renal:  No costovertebral tenderness  GU:  No performed at this time. Endoc: No evident thyromegaly, no signs of acromegaly. Skin:   warm, no rashes, no ecchymosis  Extremities: normal, no cyanosis, clubbing.  Other findings:    LABORATORY PANEL:   CBC Recent Labs  Lab 02/08/18 1138  WBC 10.2  HGB 13.8  HCT 40.9  PLT 228.0   ------------------------------------------------------------------------------------------------------------------  Chemistries  No results for input(s): NA, K, CL, CO2, GLUCOSE, BUN, CREATININE, CALCIUM, MG, AST, ALT, ALKPHOS, BILITOT in the last 168 hours.  Invalid input(s):  GFRCGP ------------------------------------------------------------------------------------------------------------------  Cardiac Enzymes No results for input(s): TROPONINI in the last 168 hours. ------------------------------------------------------------  RADIOLOGY:  No results found.     Thank  you for the consultation and for allowing [facility] to assist in the care of your patient. Our recommendations are noted above.  Please contact us if we can be of further service.   Marda Stalker, MD.  Board Certified in Internal Medicine, Pulmonary Medicine, [place], and Sleep Medicine.  [facility] Office Number: [phone]  Patricia Pesa, M.D.  Merton Border, M.D  02/13/2018

## 2018-02-13 NOTE — Patient Instructions (Addendum)
You have pulmonary fibrosis.  Will send for a pulmonary function test, A repeat CAT scan, and blood work.    Patient education: Idiopathic pulmonary fibrosis (The Basics) What is idiopathic pulmonary fibrosis? Idiopathic pulmonary fibrosis (also called "IPF") is a lung disease that makes it hard to breathe. It damages the air sacs in your lungs that send oxygen to the blood. This damage causes the lungs to be stiff. It also makes it hard for oxygen to reach the blood. This makes people with IPF cough and get short of breath.  People who get IPF are usually older than 80. It is a very serious illness that cannot be cured and gets worse over time. In some people, IPF can stay stable for several years before getting worse, or get worse gradually. But in others it gets worse more quickly.  Why did I get idiopathic pulmonary fibrosis? Doctors do not know exactly how IPF starts. The risk of IPF is greater for people who:  ?Smoke or used to smoke  ?Have breathed in a lot of toxic chemicals or pollution  ?Have breathed in certain types of dust at work, over a long time  ?Have family members with pulmonary fibrosis  What are the symptoms of idiopathic pulmonary fibrosis? IPF can start very slowly, so it might not cause any symptoms at first. When it does, symptoms can include:  ?Shortness of breath during exercise or other physical activity  ?Dry cough  Are there tests for idiopathic pulmonary fibrosis? Yes. Doctors can do:  ?Blood tests to make sure you don't have a different type of lung disease - There is no blood test for IPF.  ?Breathing tests to see how well your lungs are working - Breathing tests can show if your shortness of breath is caused by IPF or another disease such as emphysema.  ?An imaging test called a "CT scan" - This test uses a special X-ray to create pictures of the inside of the body. It can show lung damage caused by IPF.  ?If the doctor is not sure you have IPF  after the CT scan, he or she might do a lung biopsy. In this test, a doctor does surgery to take a small sample of tissue from your lung. Another doctor looks at the sample under a microscope for signs of IPF.  How is idiopathic pulmonary fibrosis treated? There is no treatment to cure IPF. It usually gets worse slowly. But doctors can treat some of the symptoms. These treatments can include:  ?Quitting smoking - If you smoke cigarettes, the most important thing you can do is stop smoking.  ?Flu and pneumonia vaccines - You should get the flu shot every fall and the pneumonia vaccine at least once. Infections like the flu and pneumonia can hurt your lungs. It's important to try to prevent them.  ?Oxygen - As IPF gets worse, some people need to breathe oxygen from a tank they carry with them.  ?Pulmonary rehab - In pulmonary rehab, people learn exercises and ways to breathe that can help with IPF symptoms.  ?Medicine - Two medicines, nintedanib (brand name: Ofev) and pirfenidone (brand name: Esbriet), have been shown to slow lung damage. But they do not cure IPF.  ?Clinical trials - If you are interested, you can join a research study of new medicines that might help people with IPF.  ?Treatment for acid reflux - Acid reflux is when the acid that is normally in your stomach backs up into your esophagus.  The esophagus is the tube that carries food from your mouth to your stomach. People who have acid reflux might need medicine to stop the acid reflux from making IPF worse.  ?Lung transplant - This is surgery to replace 1 or both diseased lungs with healthy lungs. It is done only if a person with IPF meets certain conditions.  IPF sometimes gets worse very quickly, over a few days to weeks. If this happens, you should tell your doctor. Doctors can try to treat it with antibiotics and steroid medicines. (These are not the same as the steroids some athletes take illegally.)

## 2018-02-17 NOTE — Progress Notes (Signed)
Cardiology Office Note  Date:  02/20/2018   ID:  Brian Bass, DOB 1941-03-08, MRN [id number]  PCP:  Jinny Sanders, MD   Chief Complaint  Patient presents with  . New Patient (Initial Visit)    Referred by [facility] for SOB on exertion. Patient denies chest pain and swelling in Ankles at this time. Meds reviewed verbally with patient.     HPI:  Brian Bass  is a 77 year old gentleman with past medical history of HTN,   Diffuse parenchymal lung disease,   dating back to 2014 CAD,  CABG x 3, November, 2011. [facility] poorly controlled DM  presents by referral from  Dr. Diona Browner for evaluation of worsening dyspnea on exertion  He reports long history of shortness of breath with hills, getting worse recently On flat surface does fine, only symptomatic if he walks quickly up hills   Review of records show CT scan 2014 showing interstitial lung disease Not much workup since that time He has been told there are several medications that could be used for his symptoms Seen recently by pulmonary, question of whether his shortness of breath is secondary to cardiac etiology  No SOB at rest, no wheeze. No chest pain. No peripheral edema. No unexpected weight gain.   Fell accidental in 11/2017  and broke ribs on   Left side.. No CXR. Pain resolved after 6 weeks.  No recent stress testing Lab work reviewed with him showing LDL of 80.  Does not want additional medication other than simvastatin  Hemoglobin A1c 8.2 Wife reports that he does eat carbohydrates  EKG personally reviewed by myself on todays visit Shows normal sinus rhythm rate 75 bpm no significant ST or T wave changes EKG dated February 08, 2018  PMH:   has a past medical history of Adenomatous colon polyp, Allergy, Arthritis, Complication of anesthesia, Diabetes mellitus, Esophageal stricture, GERD (gastroesophageal reflux disease), History of colon polyps (922/2011), Hyperlipidemia, and IBS (irritable bowel  syndrome).  PSH:    Past Surgical History:  Procedure Laterality Date  . COLONOSCOPY    . CORONARY ARTERY BYPASS GRAFT  10-2010   3 vessels  . UPPER GASTROINTESTINAL ENDOSCOPY      Current Outpatient Medications  Medication Sig Dispense Refill  . ACCU-CHEK FASTCLIX LANCETS MISC 1 Device by Other route 3 (three) times daily. Check blood sugars 2 to 3 times daily.  Dx: 250.02 102 each 5  . aspirin 81 MG tablet Take 81 mg by mouth daily.    . Blood Glucose Monitoring Suppl (ACCU-CHEK NANO SMARTVIEW) W/DEVICE KIT 1 kit by Other route 3 (three) times daily. Use to check blood sugars 2 to 3 times daily.  Dx: 250.02 1 kit 0  . colchicine 0.6 MG tablet For flare take 2 tab x 1, repeat 1 tabslet in 2 hours if pain continues, then one tablet daily 60 tablet 0  . fenofibrate 160 MG tablet Take 1 tablet (160 mg total) by mouth daily. 90 tablet 1  . glipiZIDE (GLUCOTROL) 10 MG tablet TAKE ONE TABLET BY MOUTH TWICE A DAY BEFORE A MEAL 180 tablet 1  . glucose blood (ACCU-CHEK SMARTVIEW) test strip USE TO CHECK BLOOD SUGARS 2 TO 3 TIMES DAILY.  Dx: E11.65 300 each 3  . insulin glargine (LANTUS) 100 UNIT/ML injection INJECT 0.55 MLS (55 UNITS TOTAL) SUBCUTANEOUSLY 2 TIMES DAILY.  Dx: E11.65 (Patient taking differently: 100 Units at bedtime. INJECT 0.55 MLS (55 UNITS TOTAL) SUBCUTANEOUSLY 2 TIMES DAILY.  Dx: E11.65) 100 mL  3  . Insulin Syringe-Needle U-100 (BD INSULIN SYRINGE ULTRAFINE) 31G X 5/16" 1 ML MISC Use to inject Lantus insulin 2 times daily.  Dx: E11.65 200 each 3  . meloxicam (MOBIC) 15 MG tablet Take 1 tablet (15 mg total) by mouth daily. 90 tablet 1  . metFORMIN (GLUCOPHAGE) 500 MG tablet TAKE ONE TABLET BY MOUTH TWO TIMES A DAY WITH A MEAL 180 tablet 3  . Multiple Vitamin (MULTIVITAMIN) tablet Take 1 tablet by mouth daily.      . Omega-3 Fatty Acids (FISH OIL) 1200 MG CAPS Take 1 capsule by mouth daily.    [name] omeprazole (PRILOSEC) 20 MG capsule TAKE 1 CAPSULE EVERY DAY 90 capsule 3  .  simvastatin (ZOCOR) 40 MG tablet TAKE 1 TABLET AT BEDTIME 90 tablet 3  . tamsulosin (FLOMAX) 0.4 MG CAPS capsule 1 tablet daily, if not improving increase to 2 tabs daily. 60 capsule 3   Current Facility-Administered Medications  Medication Dose Route Frequency Provider Last Rate Last Dose  . 0.9 %  sodium chloride infusion  500 mL Intravenous Continuous Pyrtle, Lajuan Lines, MD      . 0.9 %  sodium chloride infusion  500 mL Intravenous Continuous Pyrtle, Lajuan Lines, MD         Allergies:   Patient has no known allergies.   Social History:  The patient  reports that  has never smoked. he has never used smokeless tobacco. He reports that he does not drink alcohol or use drugs.   Family History:   family history includes Alcohol abuse in his brother; Aneurysm in his father; Colon cancer in his cousin; Heart failure in his mother.    Review of Systems: Review of Systems  Constitutional: Negative.   Respiratory: Positive for shortness of breath.   Cardiovascular: Negative.   Gastrointestinal: Negative.   Musculoskeletal: Negative.   Neurological: Negative.   Psychiatric/Behavioral: Negative.   All other systems reviewed and are negative.    PHYSICAL EXAM: VS:  BP 130/80 (BP Location: Right Arm, Patient Position: Sitting, Cuff Size: Normal)   Pulse 80   Ht _0  (1.702 m)   Wt 175 lb (79.4 kg)   SpO2 97%   BMI 27.41 kg/m  , BMI Body mass index is 27.41 kg/m. GEN: Well nourished, well developed, in no acute distress  HEENT: normal  Neck: no JVD, carotid bruits, or masses Cardiac: RRR; no murmurs, rubs, or gallops,no edema  Respiratory:  clear to auscultation bilaterally, normal work of breathing GI: soft, nontender, nondistended, + BS MS: no deformity or atrophy  Skin: warm and dry, no rash Neuro:  Strength and sensation are intact Psych: euthymic mood, full affect    Recent Labs: 01/12/2018: ALT 23; BUN 18; Creatinine, Ser 1.32; Potassium 4.5; Sodium 135 02/08/2018: Hemoglobin 13.8;  Platelets 228.0    Lipid Panel Lab Results  Component Value Date   CHOL 145 01/12/2018   HDL 34.90 (L) 01/12/2018   LDLCALC 86 01/12/2018   TRIG 121.0 01/12/2018      Wt Readings from Last 3 Encounters:  02/20/18 175 lb (79.4 kg)  02/13/18 175 lb (79.4 kg)  02/08/18 176 lb 8 oz (80.1 kg)       ASSESSMENT AND PLAN:  Atherosclerosis of native coronary artery with stable angina pectoris, unspecified whether native or transplanted heart (HCC) - Plan: EKG 12-Lead Worsening shortness of breath Unable to exclude cardiac etiology Also with interstitial lung disease getting worse over the past 5 years per the patient We  have ordered a pharmacologic Myoview He reports that he would be able to treadmill but given his lung disease, likely would have shortness of breath prohibiting aggressive treadmill  Diabetes mellitus type 2, uncontrolled, without complications ([place]) - Plan: EKG 12-Lead Hemoglobin above goal 8.2 We have encouraged continued exercise, careful diet management in an effort to lose weight.  Essential hypertension, benign Blood pressure is well controlled on today's visit. No changes made to the medications.  HYPERCHOLESTEROLEMIA He does not want additional medications Goal LDL less than 70 He could take Zetia if he would like more aggressive numbers  Hx of CABG - Plan: EKG 12-Lead Worsening symptoms of shortness of breath, unclear if this is his anginal equivalent Stress testing planned  Diffuse Parynchymal Lung Disease Followed by pulmonary  Shortness of breath -  Stress test planned as above Likely will be unable to treadmill given underlying interstitial lung disease   Disposition:   F/U  12 months   Total encounter time more than 60 minutes  Greater than 50% was spent in counseling and coordination of care with the patient    Orders Placed This Encounter  Procedures  . NM Myocar Multi W/Spect W/Wall Motion / EF  . EKG 12-Lead     Signed, Esmond Plants, M.D., Ph.D. 02/20/2018  [place], [place]

## 2018-02-20 ENCOUNTER — Ambulatory Visit: Payer: Medicare HMO | Admitting: Cardiovascular Disease

## 2018-02-20 ENCOUNTER — Encounter: Payer: Self-pay | Admitting: Cardiovascular Disease

## 2018-02-20 VITALS — BP 130/80 | HR 80 | Ht 67.0 in | Wt 175.0 lb

## 2018-02-20 DIAGNOSIS — R0602 Shortness of breath: Secondary | ICD-10-CM

## 2018-02-20 DIAGNOSIS — E1165 Type 2 diabetes mellitus with hyperglycemia: Secondary | ICD-10-CM

## 2018-02-20 DIAGNOSIS — J849 Interstitial pulmonary disease, unspecified: Secondary | ICD-10-CM | POA: Diagnosis not present

## 2018-02-20 DIAGNOSIS — I10 Essential (primary) hypertension: Secondary | ICD-10-CM | POA: Diagnosis not present

## 2018-02-20 DIAGNOSIS — Z951 Presence of aortocoronary bypass graft: Secondary | ICD-10-CM

## 2018-02-20 DIAGNOSIS — E7800 Pure hypercholesterolemia, unspecified: Secondary | ICD-10-CM | POA: Diagnosis not present

## 2018-02-20 DIAGNOSIS — I25118 Atherosclerotic heart disease of native coronary artery with other forms of angina pectoris: Secondary | ICD-10-CM | POA: Diagnosis not present

## 2018-02-20 DIAGNOSIS — IMO0001 Reserved for inherently not codable concepts without codable children: Secondary | ICD-10-CM

## 2018-02-20 NOTE — Patient Instructions (Addendum)
Medication Instructions:   No medication changes made  Labwork:  No new labs needed  Testing/Procedures:  We will schedule a lexiscan myoview for cad, cabg, worsening shortness of breath [facility] MYOVIEW  Your caregiver has ordered a Stress Test with nuclear imaging. The purpose of this test is to evaluate the blood supply to your heart muscle. This procedure is referred to as a "Non-Invasive Stress Test." This is because other than having an IV started in your vein, nothing is inserted or "invades" your body. Cardiac stress tests are done to find areas of poor blood flow to the heart by determining the extent of coronary artery disease (CAD). Some patients exercise on a treadmill, which naturally increases the blood flow to your heart, while others who are  unable to walk on a treadmill due to physical limitations have a pharmacologic/chemical stress agent called Lexiscan . This medicine will mimic walking on a treadmill by temporarily increasing your coronary blood flow.   Please note: these test may take anywhere between 2-4 hours to complete  PLEASE REPORT TO [place] AT THE FIRST DESK WILL DIRECT YOU WHERE TO GO  Date of Procedure:_____________________________________  Arrival Time for Procedure:______________________________  Instructions regarding medication:   _X___ : Hold diabetes medication morning of procedure Metformin (Glucophage), Glipizide (Glucotrol). No insulin the morning of your procedure.  _X___:  Decrease insulin the night before by half   PLEASE NOTIFY THE OFFICE AT LEAST 24 HOURS IN ADVANCE IF YOU ARE UNABLE TO KEEP YOUR APPOINTMENT.  [phone] AND  PLEASE NOTIFY NUCLEAR MEDICINE AT [facility] AT LEAST 24 HOURS IN ADVANCE IF YOU ARE UNABLE TO KEEP YOUR APPOINTMENT. [phone]  How to prepare for your Myoview test:  1. Do not eat or drink after midnight 2. No caffeine for 24 hours prior to test 3. No smoking 24 hours prior to  test. 4. Your medication may be taken with water.  If your doctor stopped a medication because of this test, do not take that medication. 5. Ladies, please do not wear dresses.  Skirts or pants are appropriate. Please wear a short sleeve shirt. 6. No perfume, cologne or lotion. 7. Wear comfortable walking shoes. No heels!   Follow-Up: It was a pleasure seeing you in the office today. Please call us if you have new issues that need to be addressed before your next appt.  [phone]  Your physician wants you to follow-up in: 12 months.  You will receive a reminder letter in the mail two months in advance. If you don't receive a letter, please call our office to schedule the follow-up appointment.  If you need a refill on your cardiac medications before your next appointment, please call your pharmacy.  For educational health videos Log in to : [email or web address] Or : [email or web address], password : [email or web address]

## 2018-02-27 ENCOUNTER — Ambulatory Visit
Admission: RE | Admit: 2018-02-27 | Discharge: 2018-02-27 | Disposition: A | Discharge status: Home / Self Care | Payer: Medicare HMO | Source: Ambulatory Visit | Attending: Internal Medicine | Admitting: Internal Medicine

## 2018-02-27 ENCOUNTER — Ambulatory Visit (HOSPITAL_COMMUNITY): Payer: Medicare HMO

## 2018-02-27 ENCOUNTER — Other Ambulatory Visit
Admission: RE | Admit: 2018-02-27 | Discharge: 2018-02-27 | Disposition: A | Discharge status: Home / Self Care | Payer: Medicare HMO | Source: Ambulatory Visit | Attending: Internal Medicine | Admitting: Internal Medicine

## 2018-02-27 ENCOUNTER — Ambulatory Visit: Payer: Medicare HMO

## 2018-02-27 DIAGNOSIS — I2510 Atherosclerotic heart disease of native coronary artery without angina pectoris: Secondary | ICD-10-CM | POA: Insufficient documentation

## 2018-02-27 DIAGNOSIS — J849 Interstitial pulmonary disease, unspecified: Secondary | ICD-10-CM | POA: Diagnosis not present

## 2018-02-27 DIAGNOSIS — R0609 Other forms of dyspnea: Secondary | ICD-10-CM | POA: Diagnosis not present

## 2018-02-27 DIAGNOSIS — I700 Atherosclerosis of aorta: Secondary | ICD-10-CM | POA: Diagnosis not present

## 2018-02-27 DIAGNOSIS — D71 Functional disorders of polymorphonuclear neutrophils: Secondary | ICD-10-CM | POA: Diagnosis not present

## 2018-02-27 DIAGNOSIS — I517 Cardiomegaly: Secondary | ICD-10-CM | POA: Insufficient documentation

## 2018-02-27 MED ORDER — ALBUTEROL SULFATE (2.5 MG/3ML) 0.083% IN NEBU
2.5000 mg | INHALATION_SOLUTION | Freq: Once | RESPIRATORY_TRACT | Status: AC
  Administered 2018-02-27: 2.5 mg via RESPIRATORY_TRACT
  Filled 2018-02-27: qty 3

## 2018-02-27 NOTE — Addendum Note (Signed)
Addended by: Santiago Bur on: 02/27/2018 09:20 AM   Modules accepted: Orders

## 2018-02-28 ENCOUNTER — Encounter
Admission: RE | Admit: 2018-02-28 | Discharge: 2018-02-28 | Disposition: A | Discharge status: Home / Self Care | Payer: Medicare HMO | Source: Ambulatory Visit | Attending: Cardiovascular Disease | Admitting: Cardiovascular Disease

## 2018-02-28 DIAGNOSIS — R0602 Shortness of breath: Secondary | ICD-10-CM | POA: Diagnosis not present

## 2018-02-28 LAB — MISC LABCORP TEST (SEND OUT): Labcorp test code: 164962

## 2018-02-28 LAB — ANA W/REFLEX IF POSITIVE: Anti Nuclear Antibody(ANA): NEGATIVE

## 2018-02-28 LAB — ANGIOTENSIN CONVERTING ENZYME: Angiotensin-Converting Enzyme: 49 U/L (ref 14–82)

## 2018-02-28 LAB — RHEUMATOID FACTOR: Rhuematoid fact SerPl-aCnc: <10 IU/mL (ref 0.0–13.9)

## 2018-02-28 MED ORDER — REGADENOSON 0.4 MG/5ML IV SOLN
0.4000 mg | Freq: Once | INTRAVENOUS | Status: AC
  Administered 2018-02-28: 0.4 mg via INTRAVENOUS

## 2018-02-28 MED ORDER — TECHNETIUM TC 99M TETROFOSMIN IV KIT
14.2300 | PACK | Freq: Once | INTRAVENOUS | Status: AC | PRN
  Administered 2018-02-28: 14.23 via INTRAVENOUS

## 2018-02-28 MED ORDER — TECHNETIUM TC 99M TETROFOSMIN IV KIT
32.8200 | PACK | Freq: Once | INTRAVENOUS | Status: AC | PRN
  Administered 2018-02-28: 32.82 via INTRAVENOUS

## 2018-03-01 LAB — NM MYOCAR MULTI W/SPECT W/WALL MOTION / EF
CHL CUP NUCLEAR SRS: 6
CHL CUP RESTING HR STRESS: 75 {beats}/min
LV dias vol: 72 mL (ref 62–150)
LVSYSVOL: 30 mL
NUC STRESS TID: 0.85
Peak HR: 95 {beats}/min
Percent HR: 66 %
SDS: 9
SSS: 14

## 2018-03-02 ENCOUNTER — Telehealth: Payer: Self-pay | Admitting: *Deleted

## 2018-03-02 NOTE — Telephone Encounter (Signed)
Appointments changed to allow patient to see both providers next week and Brian Bass Called to verify with patient.

## 2018-03-02 NOTE — Telephone Encounter (Signed)
Spoke with patient regarding Dr. Donivan Scull results and recommendations. He verbalized understanding and would like to schedule on Tuesday to see both Dr. Rockey Situ and Dr. Juanell Fairly. Advised that I would try to change appointments to allow him to see both providers and would then be in touch with him. He was agreeable with this plan and had no further questions at this time.

## 2018-03-02 NOTE — Telephone Encounter (Signed)
-----  Message from Minna Merritts, MD sent at 03/01/2018  5:42 PM EDT ----- Stress test concerning for perfusion defect/blockage front part of the heart Abnormality noted on stress testing in that area Would recommend he come in for appointment next week to discuss results, decide whether he would like cardiac catheterization At that time if he is interested we would do required lab work other preparation for cardiac catheterization and schedule on that day

## 2018-03-04 NOTE — Progress Notes (Signed)
Cardiology Office Note  Date:  03/06/2018   ID:  [name], DOB 1941-02-16, MRN [id number]  PCP:  Jinny Sanders, MD   Chief Complaint  Patient presents with  . OTHER    F/u stress test results. Meds reviewed verbally with pt.    HPI:  [name]  is a 77 year old gentleman with past medical history of HTN,   Diffuse parenchymal lung disease,   dating back to 2014 CAD,  CABG x 3, November, 2011. [facility] poorly controlled DM  presents for f/u of his worsening dyspnea on exertion, stress test  Stress test 02/28/2018 Pharmacological myocardial perfusion imaging study with moderate sized region of moderate intensity ischemia in the mid to distal LAD Ischemia noted on attenuation corrected and non-attenuation corrected images Normal wall motion, EF estimated at 40% No EKG changes concerning for ischemia at peak stress or in recovery. High risk scan    HBa1c 8.2 On further discussion of his shortness of breath, he reports he is "okay", but has symptoms if he pushes himself too hard Previously reported having shortness of breath with hills,  Symptoms were getting worse On flat surface does fine, only symptomatic if he walks quickly up hills   CT scan 2014 and last week showing interstitial lung disease getting worse  He does deny chest pain, leg edema   Fell accidental in 11/2017  and broke ribs on   Left side.. No CXR. Pain resolved after 6 weeks.  LDL of 80.  Previously declined other medications other than simvastatin Does not want additional medication other than simvastatin  Hemoglobin A1c 8.2  He declined EKG on today's visit  PMH:   has a past medical history of Adenomatous colon polyp, Allergy, Arthritis, Complication of anesthesia, Diabetes mellitus, Esophageal stricture, GERD (gastroesophageal reflux disease), History of colon polyps (922/2011), Hyperlipidemia, and IBS (irritable bowel syndrome).  PSH:    Past Surgical History:  Procedure  Laterality Date  . COLONOSCOPY    . CORONARY ARTERY BYPASS GRAFT  10-2010   3 vessels  . UPPER GASTROINTESTINAL ENDOSCOPY      Current Outpatient Medications  Medication Sig Dispense Refill  . ACCU-CHEK FASTCLIX LANCETS MISC 1 Device by Other route 3 (three) times daily. Check blood sugars 2 to 3 times daily.  Dx: 250.02 102 each 5  . aspirin 81 MG tablet Take 81 mg by mouth daily.    . Blood Glucose Monitoring Suppl (ACCU-CHEK NANO SMARTVIEW) W/DEVICE KIT 1 kit by Other route 3 (three) times daily. Use to check blood sugars 2 to 3 times daily.  Dx: 250.02 1 kit 0  . colchicine 0.6 MG tablet For flare take 2 tab x 1, repeat 1 tabslet in 2 hours if pain continues, then one tablet daily 60 tablet 0  . fenofibrate 160 MG tablet Take 1 tablet (160 mg total) by mouth daily. 90 tablet 1  . glipiZIDE (GLUCOTROL) 10 MG tablet TAKE ONE TABLET BY MOUTH TWICE A DAY BEFORE A MEAL 180 tablet 1  . glucose blood (ACCU-CHEK SMARTVIEW) test strip USE TO CHECK BLOOD SUGARS 2 TO 3 TIMES DAILY.  Dx: E11.65 300 each 3  . insulin glargine (LANTUS) 100 UNIT/ML injection INJECT 0.55 MLS (55 UNITS TOTAL) SUBCUTANEOUSLY 2 TIMES DAILY.  Dx: E11.65 (Patient taking differently: 100 Units at bedtime. INJECT 0.55 MLS (55 UNITS TOTAL) SUBCUTANEOUSLY 2 TIMES DAILY.  Dx: E11.65) 100 mL 3  . Insulin Syringe-Needle U-100 (BD INSULIN SYRINGE ULTRAFINE) 31G X 5/16" 1 ML MISC Use  to inject Lantus insulin 2 times daily.  Dx: E11.65 200 each 3  . meloxicam (MOBIC) 15 MG tablet Take 1 tablet (15 mg total) by mouth daily. 90 tablet 1  . metFORMIN (GLUCOPHAGE) 500 MG tablet TAKE ONE TABLET BY MOUTH TWO TIMES A DAY WITH A MEAL 180 tablet 3  . Multiple Vitamin (MULTIVITAMIN) tablet Take 1 tablet by mouth daily.      . Omega-3 Fatty Acids (FISH OIL) 1200 MG CAPS Take 1 capsule by mouth daily.    [name] omeprazole (PRILOSEC) 20 MG capsule TAKE 1 CAPSULE EVERY DAY 90 capsule 3  . simvastatin (ZOCOR) 40 MG tablet TAKE 1 TABLET AT BEDTIME 90 tablet  3  . tamsulosin (FLOMAX) 0.4 MG CAPS capsule 1 tablet daily, if not improving increase to 2 tabs daily. 60 capsule 3   Current Facility-Administered Medications  Medication Dose Route Frequency Provider Last Rate Last Dose  . 0.9 %  sodium chloride infusion  500 mL Intravenous Continuous Pyrtle, Lajuan Lines, MD      . 0.9 %  sodium chloride infusion  500 mL Intravenous Continuous Pyrtle, Lajuan Lines, MD         Allergies:   Patient has no known allergies.   Social History:  The patient  reports that he has never smoked. He has never used smokeless tobacco. He reports that he does not drink alcohol or use drugs.   Family History:   family history includes Alcohol abuse in his brother; Aneurysm in his father; Colon cancer in his cousin; Heart failure in his mother.    Review of Systems: Review of Systems  Constitutional: Negative.   Respiratory: Positive for shortness of breath.   Cardiovascular: Negative.   Gastrointestinal: Negative.   Musculoskeletal: Negative.   Neurological: Negative.   Psychiatric/Behavioral: Negative.   All other systems reviewed and are negative.    PHYSICAL EXAM: VS:  BP 130/84 (BP Location: Left Arm, Patient Position: Sitting, Cuff Size: Normal)   Pulse 85   Ht _0  (1.676 m)   Wt 176 lb 4 oz (79.9 kg)   BMI 28.45 kg/m  , BMI Body mass index is 28.45 kg/m. Constitutional:  oriented to person, place, and time. No distress.  HENT:  Head: Normocephalic and atraumatic.  Eyes:  no discharge. No scleral icterus.  Neck: Normal range of motion. Neck supple. No JVD present.  Cardiovascular: Normal rate, regular rhythm, normal heart sounds and intact distal pulses. Exam reveals no gallop and no friction rub. No edema No murmur heard. Pulmonary/Chest: Effort normal and breath sounds normal. No stridor. No respiratory distress.  no wheezes.  Crackles at the bases halfway up on the right, one third on the left no tenderness.  Abdominal: Soft.  no distension.  no  tenderness.  Musculoskeletal: Normal range of motion.  no  tenderness or deformity.  Neurological:  normal muscle tone. Coordination normal. No atrophy Skin: Skin is warm and dry. No rash noted. not diaphoretic.  Psychiatric:  normal mood and affect. behavior is normal. Thought content normal.    Recent Labs: 01/12/2018: ALT 23; BUN 18; Creatinine, Ser 1.32; Potassium 4.5; Sodium 135 02/08/2018: Hemoglobin 13.8; Platelets 228.0    Lipid Panel Lab Results  Component Value Date   CHOL 145 01/12/2018   HDL 34.90 (L) 01/12/2018   LDLCALC 86 01/12/2018   TRIG 121.0 01/12/2018      Wt Readings from Last 3 Encounters:  03/06/18 176 lb 4 oz (79.9 kg)  02/20/18 175 lb (79.4 kg)  02/13/18 175 lb (79.4 kg)       ASSESSMENT AND PLAN:  Atherosclerosis of native coronary artery with stable angina pectoris, unspecified whether native or transplanted heart ([place]) - Plan: EKG 12-Lead Stress test ordered for worsening shortness of breath results discussed with him in detail There is a perfusion abnormality anterior wall concerning for ischemia Difficult images given attenuation artifact Results discussed with him in detail,  Previous CABG surgery discussed with him  All questions from him and his wife answered At this time he is not interested in cardiac catheterization and he feels symptoms are secondary to his lungs -We did suggest he consider cardiac CTA to definitively rule out ischemia "He will think about it", "enough talk about testing for today" Recommended if symptoms get worse that he call us if he would like a cardiac CTA chest to call us and we would help arrange it over the phone We did offer catheterization as well  Diabetes mellitus type 2, uncontrolled, without complications ([place]) - Plan: EKG 12-Lead Hemoglobin above goal 8.2 Wife reports some dietary indiscretion  Essential hypertension, benign Blood pressure is well controlled on today's visit. No changes made to the  medications. Blood pressure stable  HYPERCHOLESTEROLEMIA Last visit did not want additional medications Today he is more receptive to taking Zetia 10 mg daily Prescription has been sent in Goal LDL less than 70  Hx of CABG - Plan: EKG 12-Lead Stress test as above with suggestion of ischemia anterior wall though clearly not definitive in the setting of some artifact As above CTA offered, cardiac catheterization offered, he will call us back if he would like either of these done  Diffuse Parynchymal Lung Disease Followed by pulmonary  Shortness of breath -  Likely predominantly from interstitial lung disease getting worse over the past several years Scheduled to see pulmonary  Disposition:   F/U 12 months or sooner if needed Recommended he call us if he would like further testing   Total encounter time more than 45 minutes  Greater than 50% was spent in counseling and coordination of care with the patient    Orders Placed This Encounter  Procedures  . EKG 12-Lead     Signed, Esmond Plants, M.D., Ph.D. 03/06/2018  [place], [place]

## 2018-03-06 ENCOUNTER — Encounter: Payer: Self-pay | Admitting: Cardiovascular Disease

## 2018-03-06 ENCOUNTER — Ambulatory Visit: Payer: Medicare HMO | Admitting: Internal Medicine

## 2018-03-06 ENCOUNTER — Ambulatory Visit: Payer: Medicare HMO | Admitting: Cardiovascular Disease

## 2018-03-06 ENCOUNTER — Encounter: Payer: Self-pay | Admitting: Internal Medicine

## 2018-03-06 VITALS — BP 130/84 | HR 85 | Ht 66.0 in | Wt 176.2 lb

## 2018-03-06 VITALS — BP 140/70 | HR 87 | Resp 16 | Ht 66.0 in | Wt 175.0 lb

## 2018-03-06 DIAGNOSIS — R9439 Abnormal result of other cardiovascular function study: Secondary | ICD-10-CM | POA: Diagnosis not present

## 2018-03-06 DIAGNOSIS — E1165 Type 2 diabetes mellitus with hyperglycemia: Secondary | ICD-10-CM | POA: Diagnosis not present

## 2018-03-06 DIAGNOSIS — I25118 Atherosclerotic heart disease of native coronary artery with other forms of angina pectoris: Secondary | ICD-10-CM

## 2018-03-06 DIAGNOSIS — IMO0001 Reserved for inherently not codable concepts without codable children: Secondary | ICD-10-CM

## 2018-03-06 DIAGNOSIS — I10 Essential (primary) hypertension: Secondary | ICD-10-CM

## 2018-03-06 DIAGNOSIS — J849 Interstitial pulmonary disease, unspecified: Secondary | ICD-10-CM | POA: Diagnosis not present

## 2018-03-06 DIAGNOSIS — E7800 Pure hypercholesterolemia, unspecified: Secondary | ICD-10-CM

## 2018-03-06 DIAGNOSIS — R0609 Other forms of dyspnea: Secondary | ICD-10-CM

## 2018-03-06 DIAGNOSIS — Z951 Presence of aortocoronary bypass graft: Secondary | ICD-10-CM

## 2018-03-06 MED ORDER — EZETIMIBE 10 MG PO TABS: 10.0000 mg | ORAL_TABLET | Freq: Every day | ORAL | 3 refills | Status: DC

## 2018-03-06 NOTE — Progress Notes (Signed)
[facility] Consultation      Assessment and Plan:  Interstitial lung disease-suspect idiopathic pulmonary fibrosis. - Discussed with patient and wife in regards to what the diagnosis of pulmonary fibrosis is, and prognosis which is highly variable, and that on average, I would suspect that his outlook is 4-5 years.  --We discussed starting Esbriet today, he is skeptical of starting medications given their high cost and side effects, he is agreeable to considering it if he can know what his out-of-pocket cost is for the medication, as they live from paycheck to paycheck on Social Security. -We will have them fill out paperwork for approval to start Neihart, and we can into see how much it will cost them. -His most recent complete metabolic panel on 04/07/64 showed normal hepatic functions.  We will repeat after starting Esbriet.  Dyspnea on exertion. -Progressive, particularly over the last 1 year, likely secondary to above.  Coronary artery disease. -Myocardial perfusion study showed possible anterior ischemia, uncertain if this is contributing to dyspnea (but given underlying pulmonary fibrosis appears doubtful), cardiology is currently working the patient up.   Return in about 3 months (around 06/05/2018).   Date: 03/06/2018  MRN# [id number] Brian Bass 09-17-1941  Referring Physician: Dr. Eben Burow is a 77 y.o. old male seen in consultation for chief complaint of:    Chief Complaint  Patient presents with  . Interstitial Lung Disease    Pt here for f/u with results PFT/CT/labs. Pt states breathing unchanged  . Shortness of Breath    with exertion  . Cough    non-productive.    HPI:   The patient is a 77 year old male with a history of interstitial lung disease.He walks his dog about every day and notes that when he gets to the top of hill he has to stop. He broke his ribs about 8 weeks ago after a fall and since then has developed a  cough. He is present with his wife who gives most of the history, nearly all of the history.  He also gets dyspnea when carrying groceries in. This has been going on for about 6 months to a years and has been getting progressively worse. He is active outside, he does work in Biomedical engineer, he walks his dog, he has been remodeling a house in [place].  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 He has never smoked his wife smoked for 15 years.   Per CT chest: There are calcifications of the aortic valve. Echocardiographic correlation for evaluation of potential valvular dysfunction may be warranted if clinically indicated.  **Imaging personally reviewed, chest x-ray 02/08/18; diffuse groundglass and interstitial changes throughout both lungs, right diaphragm is elevated.  Interstitial changes has progressed significantly since previous chest x-ray on 09/06/13.  CT chest 10/21/13, bilateral emphysematous changes, bilateral subpleural reticular changes consistent with pulmonary fibrosis.  CT high-resolution 02/27/18, the bibasilar fibrotic changes have advanced significantly in the subpleural areas of traction bronchiectasis changes have worsened.  Right paratracheal lymphadenopathy minimally changed  **PFT tracings personally reviewed, 02/27/18 FVC is 63% predicted, FEV1 of 70% predicted, ratio 86% predicted.  There is no significant change with bronchodilator therapy.  Flow volume loop is consistent with restriction. DLCO is 54% predicted, DLCO is reduced at 56%. -Overall this test consistent with moderate restrictive lung disease.  **Serology 02/27/18; ACE, rheumatoid factor, ANA  negative.    Medication:    Current Outpatient Medications:  .  ACCU-CHEK FASTCLIX LANCETS MISC, 1 Device by Other route 3 (three) times daily. Check blood sugars 2 to 3 times daily.  Dx: 250.02, Disp: 102 each, Rfl:  5 .  aspirin 81 MG tablet, Take 81 mg by mouth daily., Disp: , Rfl:  .  Blood Glucose Monitoring Suppl (ACCU-CHEK NANO SMARTVIEW) W/DEVICE KIT, 1 kit by Other route 3 (three) times daily. Use to check blood sugars 2 to 3 times daily.  Dx: 250.02, Disp: 1 kit, Rfl: 0 .  colchicine 0.6 MG tablet, For flare take 2 tab x 1, repeat 1 tabslet in 2 hours if pain continues, then one tablet daily, Disp: 60 tablet, Rfl: 0 .  fenofibrate 160 MG tablet, Take 1 tablet (160 mg total) by mouth daily., Disp: 90 tablet, Rfl: 1 .  glipiZIDE (GLUCOTROL) 10 MG tablet, TAKE ONE TABLET BY MOUTH TWICE A DAY BEFORE A MEAL, Disp: 180 tablet, Rfl: 1 .  glucose blood (ACCU-CHEK SMARTVIEW) test strip, USE TO CHECK BLOOD SUGARS 2 TO 3 TIMES DAILY.  Dx: E11.65, Disp: 300 each, Rfl: 3 .  insulin glargine (LANTUS) 100 UNIT/ML injection, INJECT 0.55 MLS (55 UNITS TOTAL) SUBCUTANEOUSLY 2 TIMES DAILY.  Dx: E11.65 (Patient taking differently: 100 Units at bedtime. INJECT 0.55 MLS (55 UNITS TOTAL) SUBCUTANEOUSLY 2 TIMES DAILY.  Dx: E11.65), Disp: 100 mL, Rfl: 3 .  Insulin Syringe-Needle U-100 (BD INSULIN SYRINGE ULTRAFINE) 31G X 5/16" 1 ML MISC, Use to inject Lantus insulin 2 times daily.  Dx: E11.65, Disp: 200 each, Rfl: 3 .  meloxicam (MOBIC) 15 MG tablet, Take 1 tablet (15 mg total) by mouth daily., Disp: 90 tablet, Rfl: 1 .  metFORMIN (GLUCOPHAGE) 500 MG tablet, TAKE ONE TABLET BY MOUTH TWO TIMES A DAY WITH A MEAL, Disp: 180 tablet, Rfl: 3 .  Multiple Vitamin (MULTIVITAMIN) tablet, Take 1 tablet by mouth daily.  , Disp: , Rfl:  .  Omega-3 Fatty Acids (FISH OIL) 1200 MG CAPS, Take 1 capsule by mouth daily., Disp: , Rfl:  .  omeprazole (PRILOSEC) 20 MG capsule, TAKE 1 CAPSULE EVERY DAY, Disp: 90 capsule, Rfl: 3 .  simvastatin (ZOCOR) 40 MG tablet, TAKE 1 TABLET AT BEDTIME, Disp: 90 tablet, Rfl: 3 .  tamsulosin (FLOMAX) 0.4 MG CAPS capsule, 1 tablet daily, if not improving increase to 2 tabs daily., Disp: 60 capsule, Rfl: 3  Current  Facility-Administered Medications:  .  0.9 %  sodium chloride infusion, 500 mL, Intravenous, Continuous, Pyrtle, Lajuan Lines, MD .  0.9 %  sodium chloride infusion, 500 mL, Intravenous, Continuous, Pyrtle, Lajuan Lines, MD   Allergies:  Patient has no known allergies.  Review of Systems: Gen:  Denies  fever, sweats, chills HEENT: Denies blurred vision, double vision. bleeds, sore throat Cvc:  No dizziness, chest pain. Resp:   Denies cough or sputum production, shortness of breath Gi: Denies swallowing difficulty, stomach pain. Gu:  Denies bladder incontinence, burning urine Ext:   No Joint pain, stiffness. Skin: No skin rash,  hives  Endoc:  No polyuria, polydipsia. Psych: No depression, insomnia. Other:  All other systems were reviewed with the patient and were negative other that what is mentioned in the HPI.   Physical Examination:   VS: BP 140/70 (BP Location: Left Arm, Cuff Size: Normal)   Pulse 87   Resp 16   Ht _0  (1.676 m)   Wt 175 lb (79.4 kg)   SpO2 94%  BMI 28.25 kg/m   General Appearance: No distress  Neuro:without focal findings,  speech normal,  HEENT: PERRLA, EOM intact.   Pulmonary: bilateral basal crackles, No wheezing.  CardiovascularNormal S1,S2.  No m/r/g.   Abdomen: Benign, Soft, non-tender. Renal:  No costovertebral tenderness  GU:  No performed at this time. Endoc: No evident thyromegaly, no signs of acromegaly. Skin:   warm, no rashes, no ecchymosis  Extremities: normal, no cyanosis, clubbing.  Other findings:    LABORATORY PANEL:   CBC No results for input(s): WBC, HGB, HCT, PLT in the last 168 hours. ------------------------------------------------------------------------------------------------------------------  Chemistries  No results for input(s): NA, K, CL, CO2, GLUCOSE, BUN, CREATININE, CALCIUM, MG, AST, ALT, ALKPHOS, BILITOT in the last 168 hours.  Invalid input(s):  GFRCGP ------------------------------------------------------------------------------------------------------------------  Cardiac Enzymes No results for input(s): TROPONINI in the last 168 hours. ------------------------------------------------------------  RADIOLOGY:  No results found.     Thank  you for the consultation and for allowing [facility] to assist in the care of your patient. Our recommendations are noted above.  Please contact us if we can be of further service.   Marda Stalker, MD.  Board Certified in Internal Medicine, Pulmonary Medicine, [place], and Sleep Medicine.  [facility] Office Number: [phone]  Patricia Pesa, M.D.  Merton Border, M.D  03/06/2018

## 2018-03-06 NOTE — Patient Instructions (Addendum)
Will start you on Esbriet (aka Pirfenidone), a medication for pulmonary fibrosis.   Below is some information on pulmonary fibrosis. A good source of information is the pulmonary fibrosis foundation, TaxHiking.com.br.   What is idiopathic pulmonary fibrosis? Idiopathic pulmonary fibrosis (also called "IPF") is a lung disease that makes it hard to breathe. It damages the air sacs in your lungs that send oxygen to the blood. This damage causes the lungs to be stiff. It also makes it hard for oxygen to reach the blood. This makes people with IPF cough and get short of breath.  People who get IPF are usually older than 24. It is a very serious illness that cannot be cured and gets worse over time. In some people, IPF can stay stable for several years before getting worse, or get worse gradually. But in others it gets worse more quickly.  Why did I get idiopathic pulmonary fibrosis? Doctors do not know exactly how IPF starts. The risk of IPF is greater for people who:  ?Smoke or used to smoke  ?Have breathed in a lot of toxic chemicals or pollution  ?Have breathed in certain types of dust at work, over a long time  ?Have family members with pulmonary fibrosis  What are the symptoms of idiopathic pulmonary fibrosis? IPF can start very slowly, so it might not cause any symptoms at first. When it does, symptoms can include:  ?Shortness of breath during exercise or other physical activity  ?Dry cough  Are there tests for idiopathic pulmonary fibrosis? Yes. Doctors can do:  ?Blood tests to make sure you don't have a different type of lung disease - There is no blood test for IPF.  ?Breathing tests to see how well your lungs are working - Breathing tests can show if your shortness of breath is caused by IPF or another disease such as emphysema.  ?An imaging test called a "CT scan" - This test uses a special X-ray to create pictures of the inside of the body. It can show lung damage  caused by IPF.  ?If the doctor is not sure you have IPF after the CT scan, he or she might do a lung biopsy. In this test, a doctor does surgery to take a small sample of tissue from your lung. Another doctor looks at the sample under a microscope for signs of IPF.  How is idiopathic pulmonary fibrosis treated? There is no treatment to cure IPF. It usually gets worse slowly. But doctors can treat some of the symptoms. These treatments can include:  ?Quitting smoking - If you smoke cigarettes, the most important thing you can do is stop smoking.  ?Flu and pneumonia vaccines - You should get the flu shot every fall and the pneumonia vaccine at least once. Infections like the flu and pneumonia can hurt your lungs. It's important to try to prevent them.  ?Oxygen - As IPF gets worse, some people need to breathe oxygen from a tank they carry with them.  ?Pulmonary rehab - In pulmonary rehab, people learn exercises and ways to breathe that can help with IPF symptoms.  ?Medicine - Two medicines, nintedanib (brand name: Ofev) and pirfenidone (brand name: Esbriet), have been shown to slow lung damage. But they do not cure IPF.  ?Clinical trials - If you are interested, you can join a research study of new medicines that might help people with IPF.  ?Treatment for acid reflux - Acid reflux is when the acid that is normally in your stomach  backs up into your esophagus. The esophagus is the tube that carries food from your mouth to your stomach. People who have acid reflux might need medicine to stop the acid reflux from making IPF worse.  ?Lung transplant - This is surgery to replace 1 or both diseased lungs with healthy lungs. It is done only if a person with IPF meets certain conditions.  IPF sometimes gets worse very quickly, over a few days to weeks. If this happens, you should tell your doctor. Doctors can try to treat it with antibiotics and steroid medicines. (These are not the same as the steroids  some athletes take illegally.)

## 2018-03-06 NOTE — Patient Instructions (Addendum)
Talk with Dr. Juanell Fairly, pulmonary  If you get chest pain,  we can order a cardiac CTA to look for blockages   Medication Instructions:   Please start zetia one a day for cholesterol  Labwork:  No new labs needed  Testing/Procedures:  No further testing at this time   Follow-Up: It was a pleasure seeing you in the office today. Please call us if you have new issues that need to be addressed before your next appt.  [phone]  Your physician wants you to follow-up in: 6 months.  You will receive a reminder letter in the mail two months in advance. If you don't receive a letter, please call our office to schedule the follow-up appointment.  If you need a refill on your cardiac medications before your next appointment, please call your pharmacy.  For educational health videos Log in to : [email or web address] Or : [email or web address], password : [email or web address]

## 2018-03-13 ENCOUNTER — Telehealth: Payer: Self-pay | Admitting: *Deleted

## 2018-03-13 MED ORDER — PIRFENIDONE 267 MG PO TABS: 3.0000 | ORAL_TABLET | Freq: Three times a day (TID) | ORAL | 11 refills | Status: DC

## 2018-03-13 NOTE — Telephone Encounter (Signed)
New Message  Pts wife verbalized for nurse to give her a call. She verbalized she wanted to move forward but then again she stated she wanted to hold off because she was waiting for number.  Please f/u with pt

## 2018-03-13 NOTE — Telephone Encounter (Signed)
Initiated PA for [employer] through [place]. Sent for review.  [employer] ID: [id number] Ref # [id number] Phone: [phone]  Will await for fax within 72 hours

## 2018-03-13 NOTE — Telephone Encounter (Signed)
Patient wife returning call

## 2018-03-13 NOTE — Telephone Encounter (Signed)
Tried to call wife back but no answer and no VM set up.

## 2018-03-13 NOTE — Telephone Encounter (Signed)
Patient's spouse called to let us know that Brian Bass has been approved. RX printed awaiting MD signature then fax to [place]

## 2018-03-19 ENCOUNTER — Telehealth: Payer: Self-pay | Admitting: Internal Medicine

## 2018-03-19 NOTE — Telephone Encounter (Signed)
Spoke with patient's spouse Esbriet needs appeal. Notes and imaging along with 3 questions placed in provider folder for completion. Once completed will fax back to [facility] by 4/18.

## 2018-03-19 NOTE — Telephone Encounter (Signed)
Pt wife calling about Esbriet  She would like to know if we sent it to [facility] mail order  Please call back to let her know

## 2018-03-19 NOTE — Telephone Encounter (Signed)
Left message that rx was sent on 03/13/18 . Per patient wife she received a call that medication has been approved on last week. [employer] is still faxing for additional documentation.  Ask that she call office back if anything further needed.

## 2018-03-22 ENCOUNTER — Ambulatory Visit: Payer: Medicare HMO | Admitting: Cardiovascular Disease

## 2018-03-27 NOTE — Telephone Encounter (Signed)
Per Shanon Brow appeal denied. Explained what happened. Appeal has been refaxed. Nothing further needed at this time.

## 2018-03-27 NOTE — Telephone Encounter (Signed)
Patient's spouse states [employer] withdraw appeal. Explained that Q3 was answered incorrectly and case was closed. Information was refaxed.and our office has not heard anything yet. Called [employer] to find out current status of Esbriet coverage.

## 2018-03-27 NOTE — Telephone Encounter (Signed)
Patient's spouse [name] returning call  Has questions regarding paperwork Please call to discuss

## 2018-03-29 ENCOUNTER — Telehealth: Payer: Self-pay | Admitting: *Deleted

## 2018-03-29 NOTE — Telephone Encounter (Signed)
Called patient to make aware [employer] has approved Esbriet 03/29/18-03/29/20. Patient Assistance will pick up what ever [employer] doesn't pay.

## 2018-03-30 ENCOUNTER — Other Ambulatory Visit: Payer: Self-pay | Admitting: *Deleted

## 2018-03-30 MED ORDER — PIRFENIDONE 267 MG PO TABS: 3.0000 | ORAL_TABLET | Freq: Three times a day (TID) | ORAL | 11 refills | Status: DC

## 2018-03-30 NOTE — Telephone Encounter (Signed)
Returned call to patient's spouse and made aware of approval thru [employer]. Let her know I will fax approval to patient assistance program as well.

## 2018-03-30 NOTE — Telephone Encounter (Signed)
Patient wife [name] returning call.

## 2018-04-04 ENCOUNTER — Telehealth: Payer: Self-pay | Admitting: Internal Medicine

## 2018-04-04 NOTE — Telephone Encounter (Signed)
Appeal redetermination approval # given to [facility] #[id number]. They need patient assistance approval # as well. Informed that patient has access to this data. Will call spouse and have her call [employer] with additional information.

## 2018-04-04 NOTE — Telephone Encounter (Signed)
Returned call to [facility] to find out what the problem is with RX. Rx for Esbriet received but they were not showing an approval.

## 2018-04-04 NOTE — Telephone Encounter (Signed)
Pt wife states she called [employer] , and they states they still do not have a rx for it. Please call to discuss.

## 2018-04-04 NOTE — Telephone Encounter (Signed)
Spouse provided additional information. [employer] should be processing [place].

## 2018-04-12 ENCOUNTER — Telehealth: Payer: Self-pay | Admitting: Internal Medicine

## 2018-04-12 NOTE — Telephone Encounter (Signed)
Reference # [id number] Please call regarding Esbriet.

## 2018-04-12 NOTE — Telephone Encounter (Signed)
Was this to go to Pulmonary?

## 2018-04-13 NOTE — Telephone Encounter (Signed)
Called back to confirm intial  titration dose to start then follow with maintenance dose. Verbal order given. Nothing further needed.

## 2018-05-08 ENCOUNTER — Other Ambulatory Visit (INDEPENDENT_AMBULATORY_CARE_PROVIDER_SITE_OTHER): Payer: Medicare HMO

## 2018-05-08 DIAGNOSIS — R351 Nocturia: Secondary | ICD-10-CM

## 2018-05-08 DIAGNOSIS — E1122 Type 2 diabetes mellitus with diabetic chronic kidney disease: Secondary | ICD-10-CM | POA: Diagnosis not present

## 2018-05-08 DIAGNOSIS — N401 Enlarged prostate with lower urinary tract symptoms: Secondary | ICD-10-CM | POA: Diagnosis not present

## 2018-05-08 DIAGNOSIS — N183 Chronic kidney disease, stage 3 unspecified: Secondary | ICD-10-CM

## 2018-05-08 DIAGNOSIS — Z794 Long term (current) use of insulin: Secondary | ICD-10-CM | POA: Diagnosis not present

## 2018-05-08 LAB — COMPREHENSIVE METABOLIC PANEL
ALT: 21 U/L (ref 0–53)
AST: 16 U/L (ref 0–37)
Albumin: 4.6 g/dL (ref 3.5–5.2)
Alkaline Phosphatase: 65 U/L (ref 39–117)
BILIRUBIN TOTAL: 0.5 mg/dL (ref 0.2–1.2)
BUN: 15 mg/dL (ref 6–23)
CO2: 26 meq/L (ref 19–32)
CREATININE: 1.16 mg/dL (ref 0.40–1.50)
Calcium: 10 mg/dL (ref 8.4–10.5)
Chloride: 98 mEq/L (ref 96–112)
GFR: 64.74 mL/min (ref >60.00)
GLUCOSE: 261 mg/dL — AB (ref 70–99)
Potassium: 5.5 mEq/L — ABNORMAL HIGH (ref 3.5–5.1)
Sodium: 131 mEq/L — ABNORMAL LOW (ref 135–145)
Total Protein: 7.5 g/dL (ref 6.0–8.3)

## 2018-05-08 LAB — LIPID PANEL
CHOL/HDL RATIO: 4
Cholesterol: 125 mg/dL (ref 0–200)
HDL: 31.6 mg/dL — ABNORMAL LOW (ref >39.00)
NONHDL: 92.99
Triglycerides: 239 mg/dL — ABNORMAL HIGH (ref 0.0–149.0)
VLDL: 47.8 mg/dL — ABNORMAL HIGH (ref 0.0–40.0)

## 2018-05-08 LAB — HEMOGLOBIN A1C: Hgb A1c MFr Bld: 8.2 % — ABNORMAL HIGH (ref 4.6–6.5)

## 2018-05-08 LAB — LDL CHOLESTEROL, DIRECT: Direct LDL: 69 mg/dL

## 2018-05-09 LAB — PSA, TOTAL AND FREE
PSA, % FREE: 10 % — AB (ref >25)
PSA, FREE: 0.4 ng/mL
PSA, TOTAL: 4.1 ng/mL — AB (ref <=4.0)

## 2018-05-11 ENCOUNTER — Encounter: Payer: Self-pay | Admitting: Family Medicine

## 2018-05-11 ENCOUNTER — Other Ambulatory Visit: Payer: Self-pay | Admitting: Family Medicine

## 2018-05-11 ENCOUNTER — Ambulatory Visit (INDEPENDENT_AMBULATORY_CARE_PROVIDER_SITE_OTHER): Payer: Medicare HMO | Admitting: Family Medicine

## 2018-05-11 VITALS — BP 134/74 | HR 94 | Temp 97.5°F | Ht 66.0 in | Wt 167.4 lb

## 2018-05-11 DIAGNOSIS — I2510 Atherosclerotic heart disease of native coronary artery without angina pectoris: Secondary | ICD-10-CM

## 2018-05-11 DIAGNOSIS — E875 Hyperkalemia: Secondary | ICD-10-CM

## 2018-05-11 DIAGNOSIS — N401 Enlarged prostate with lower urinary tract symptoms: Secondary | ICD-10-CM | POA: Diagnosis not present

## 2018-05-11 DIAGNOSIS — R351 Nocturia: Secondary | ICD-10-CM | POA: Diagnosis not present

## 2018-05-11 DIAGNOSIS — Z794 Long term (current) use of insulin: Secondary | ICD-10-CM | POA: Diagnosis not present

## 2018-05-11 DIAGNOSIS — E1165 Type 2 diabetes mellitus with hyperglycemia: Secondary | ICD-10-CM | POA: Diagnosis not present

## 2018-05-11 DIAGNOSIS — IMO0001 Reserved for inherently not codable concepts without codable children: Secondary | ICD-10-CM

## 2018-05-11 DIAGNOSIS — J849 Interstitial pulmonary disease, unspecified: Secondary | ICD-10-CM

## 2018-05-11 DIAGNOSIS — E1140 Type 2 diabetes mellitus with diabetic neuropathy, unspecified: Secondary | ICD-10-CM | POA: Diagnosis not present

## 2018-05-11 LAB — BASIC METABOLIC PANEL
BUN: 20 mg/dL (ref 6–23)
CALCIUM: 10.2 mg/dL (ref 8.4–10.5)
CO2: 23 meq/L (ref 19–32)
CREATININE: 1.24 mg/dL (ref 0.40–1.50)
Chloride: 96 mEq/L (ref 96–112)
GFR: 59.94 mL/min — AB (ref >60.00)
Glucose, Bld: 296 mg/dL — ABNORMAL HIGH (ref 70–99)
Potassium: 5.7 mEq/L — ABNORMAL HIGH (ref 3.5–5.1)
SODIUM: 128 meq/L — AB (ref 135–145)

## 2018-05-11 LAB — HM DIABETES FOOT EXAM

## 2018-05-11 MED ORDER — FUROSEMIDE 20 MG PO TABS: 20.0000 mg | ORAL_TABLET | Freq: Every day | ORAL | 0 refills | Status: DC

## 2018-05-11 MED ORDER — BENZONATATE 200 MG PO CAPS: 200.0000 mg | ORAL_CAPSULE | Freq: Two times a day (BID) | ORAL | 0 refills | Status: DC | PRN

## 2018-05-11 NOTE — Progress Notes (Signed)
Subjective:    Patient ID: Brian Bass, male    DOB: 1941/05/23, 77 y.o.   MRN: [id number]  HPI   77 year old male presents for follow up.   Recent Dx of idiopathic pulmonary fibrosis causing DOE  Ambulatory 02 dropped to 94, after rest in room rose to 96.  Daily cough, nonproductive.  Followed by Pulmonary Dr. Sabino Donovan on Otho... Stopped few days ago given nausea, dizzy.  CAD-Myocardial perfusion study showed possible anterior ischemia, uncertain if this is contributing to dyspnea (but given underlying pulmonary fibrosis appears doubtful), cardiology is currently working the patient up.  LDL no at goal < 70 on zetia and zocor  PSA elevated:  PSA has decreased from 5.9 3 months ago to 4.1, percent free is low.  Occ nocturia.. Less dribbling and better flow on flomax.  Diabetes:  No improvement , remains inadequately controlled.   He is using glipizide and metfomrin.  He is frequently holding the insulin... Lab Results  Component Value Date   HGBA1C 8.2 (H) 05/08/2018  Using medications without difficulties: Hypoglycemic episodes:none Hyperglycemic episodes: yes.. 300s Feet problems:none Blood Sugars averaging: FBS 261 eye exam within last year:   Hyperpotassium K at 5.5 .. Has since decreased potassium in diet (was eating a lot of banana)   Review of Systems  Constitutional: Negative for fatigue and fever.  HENT: Negative for ear pain.   Eyes: Negative for pain.  Respiratory: Positive for cough and shortness of breath.   Cardiovascular: Negative for chest pain, palpitations and leg swelling.  Gastrointestinal: Negative for abdominal pain.  Genitourinary: Negative for dysuria.  Musculoskeletal: Negative for arthralgias.  Neurological: Negative for syncope, light-headedness and headaches.  Psychiatric/Behavioral: Negative for dysphoric mood.       Objective:   Physical Exam  Constitutional: Vital signs are normal. He appears well-developed  and well-nourished.  HENT:  Head: Normocephalic.  Right Ear: Hearing normal.  Left Ear: Hearing normal.  Nose: Nose normal.  Mouth/Throat: Oropharynx is clear and moist and mucous membranes are normal.  Neck: Trachea normal. Carotid bruit is not present. No thyroid mass and no thyromegaly present.  Cardiovascular: Normal rate, regular rhythm and normal pulses. Exam reveals no gallop, no distant heart sounds and no friction rub.  No murmur heard. No peripheral edema  Pulmonary/Chest: Effort normal. No respiratory distress. He has rales in the right lower field and the left lower field.  Skin: Skin is warm, dry and intact. No rash noted.  Pale,  decreased capillary return  Psychiatric: He has a normal mood and affect. His speech is normal and behavior is normal. Thought content normal.   Diabetic foot exam: Normal inspection No skin breakdown No calluses  Normal DP pulses Normal sensation to light touch and monofilament Nails normal        Assessment & Plan:

## 2018-05-11 NOTE — Patient Instructions (Addendum)
Please stop at the lab to have labs drawn.  Try restarting the Esbriet at lower dose tolerate for at trial.. If still SE let pulmonologist know.  Can try benzonate for dry cough. Call for referral to ENDO or change in insulin as soon as you decide. Brian Bass

## 2018-05-16 ENCOUNTER — Encounter: Payer: Self-pay | Admitting: Family Medicine

## 2018-05-16 DIAGNOSIS — E875 Hyperkalemia: Secondary | ICD-10-CM

## 2018-05-17 ENCOUNTER — Other Ambulatory Visit (INDEPENDENT_AMBULATORY_CARE_PROVIDER_SITE_OTHER): Payer: Medicare HMO

## 2018-05-17 ENCOUNTER — Other Ambulatory Visit: Payer: Self-pay | Admitting: Family Medicine

## 2018-05-17 DIAGNOSIS — E875 Hyperkalemia: Secondary | ICD-10-CM

## 2018-05-17 LAB — BASIC METABOLIC PANEL WITH GFR
BUN: 16 mg/dL (ref 6–23)
CO2: 25 meq/L (ref 19–32)
Calcium: 9.2 mg/dL (ref 8.4–10.5)
Chloride: 93 meq/L — ABNORMAL LOW (ref 96–112)
Creatinine, Ser: 1.21 mg/dL (ref 0.40–1.50)
GFR: 61.66 mL/min
Glucose, Bld: 291 mg/dL — ABNORMAL HIGH (ref 70–99)
Potassium: 4.6 meq/L (ref 3.5–5.1)
Sodium: 125 meq/L — ABNORMAL LOW (ref 135–145)

## 2018-05-17 NOTE — Telephone Encounter (Signed)
Please call pt and wife... He needs to have a repeat potassium now.. Have him schedule lab appt today. Nonfasting.

## 2018-05-17 NOTE — Telephone Encounter (Signed)
Lab appointment scheduled today at 1:00pm for BMET.

## 2018-06-02 ENCOUNTER — Other Ambulatory Visit: Payer: Self-pay | Admitting: Family Medicine

## 2018-06-02 NOTE — Telephone Encounter (Signed)
Last office visit 05/11/18.  There are two different set of directions on his Lantus insulin.  Please advise how to prescribe. 55 units twice a day or 100 units at bedtime???

## 2018-06-14 DIAGNOSIS — Z794 Long term (current) use of insulin: Secondary | ICD-10-CM | POA: Diagnosis not present

## 2018-06-14 DIAGNOSIS — E7800 Pure hypercholesterolemia, unspecified: Secondary | ICD-10-CM | POA: Diagnosis not present

## 2018-06-14 DIAGNOSIS — E1165 Type 2 diabetes mellitus with hyperglycemia: Secondary | ICD-10-CM | POA: Diagnosis not present

## 2018-06-20 DIAGNOSIS — E875 Hyperkalemia: Secondary | ICD-10-CM | POA: Insufficient documentation

## 2018-06-20 NOTE — Assessment & Plan Note (Signed)
Followed by cards and DOE work up ongoing but may be due to worsening parenchymal lung disease.

## 2018-06-20 NOTE — Assessment & Plan Note (Addendum)
Try restarting the Esbriet at lower dose tolerate for a trial.. If still SE let pulmonologist know.  Can try benzonate for dry cough.

## 2018-06-20 NOTE — Assessment & Plan Note (Signed)
Poor control DM. Pt not very compliant with meds and likes to adjust own medication. Refer to ENDO for recommendations on a simpler regimen for compliance. Also hopefully ENDO can encourage pt  On importance of DM control for other co-morbidities as well.

## 2018-06-20 NOTE — Assessment & Plan Note (Signed)
Due for re-eval on low potassium diet.

## 2018-06-20 NOTE — Assessment & Plan Note (Signed)
Improvement on flomax.

## 2018-07-04 IMAGING — DX DG CHEST 2V
2 series · 2 of 2 positions shown · non-contrast
Comparison: 09/06/2013

CLINICAL DATA: Dyspnea on exertion

EXAM:
CHEST - 2 VIEW

[chest pa]
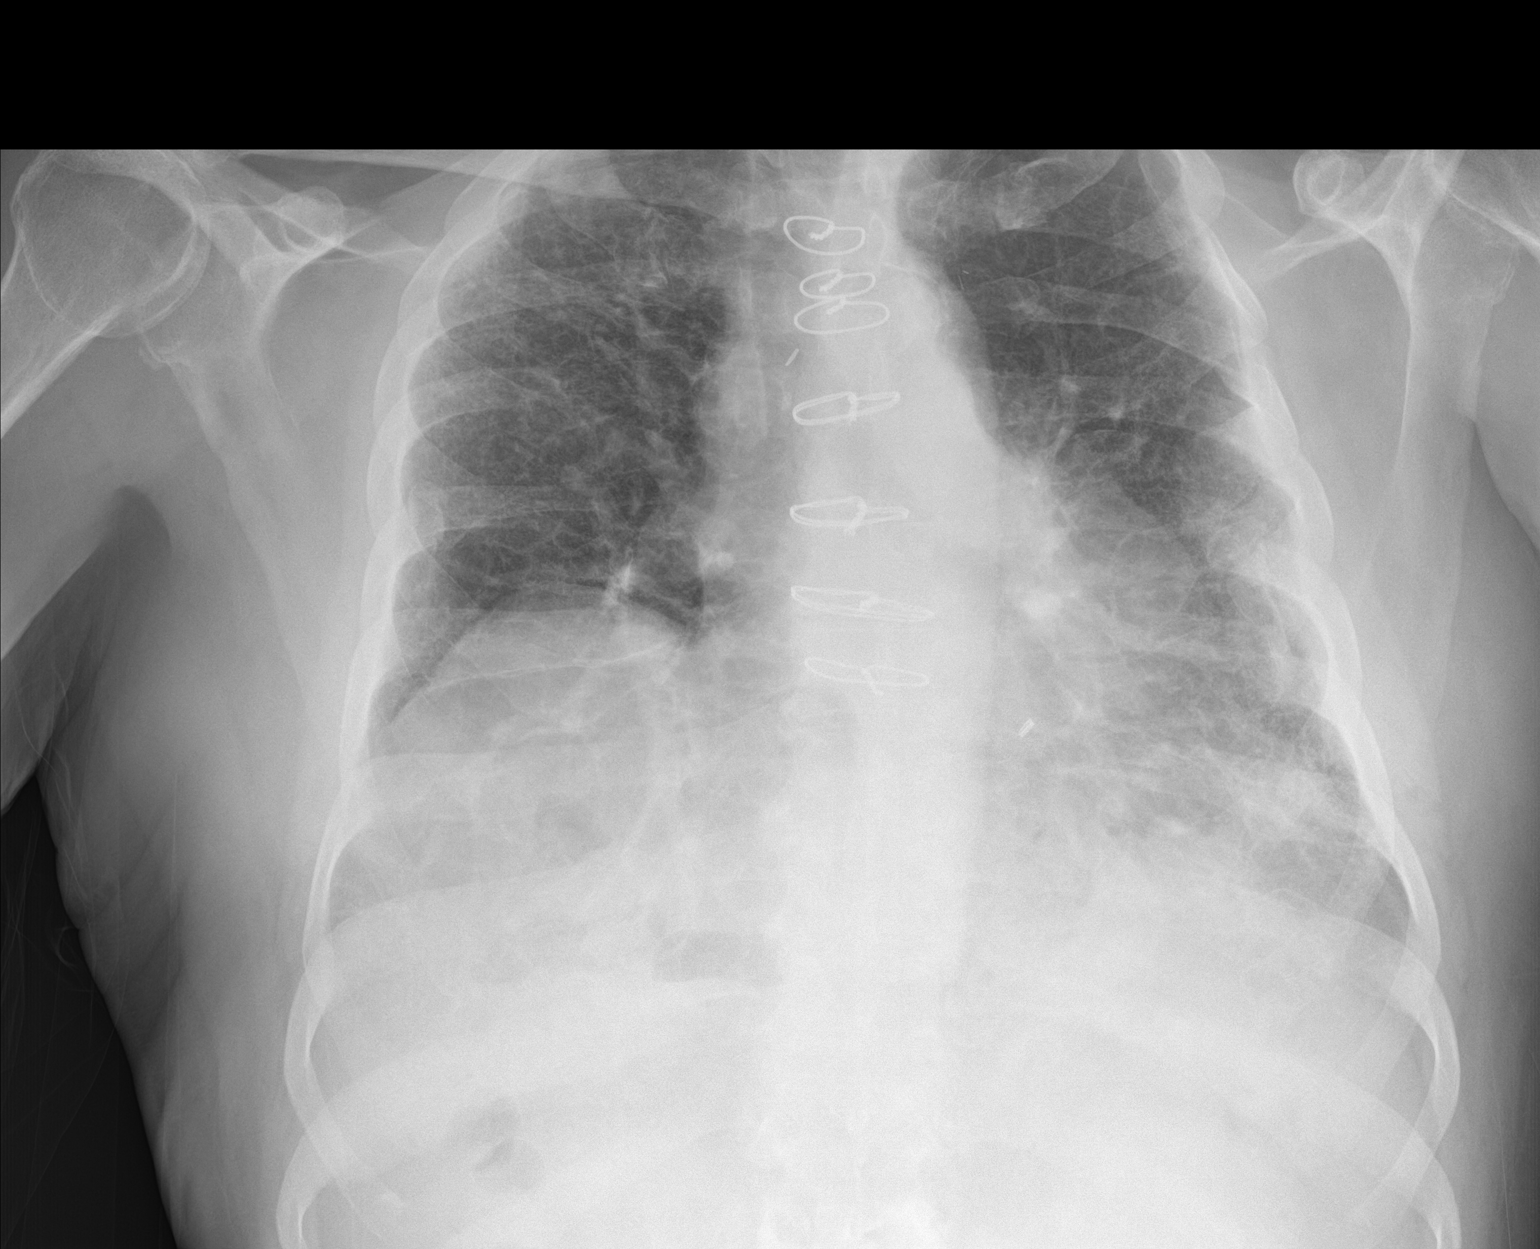

[chest lat]
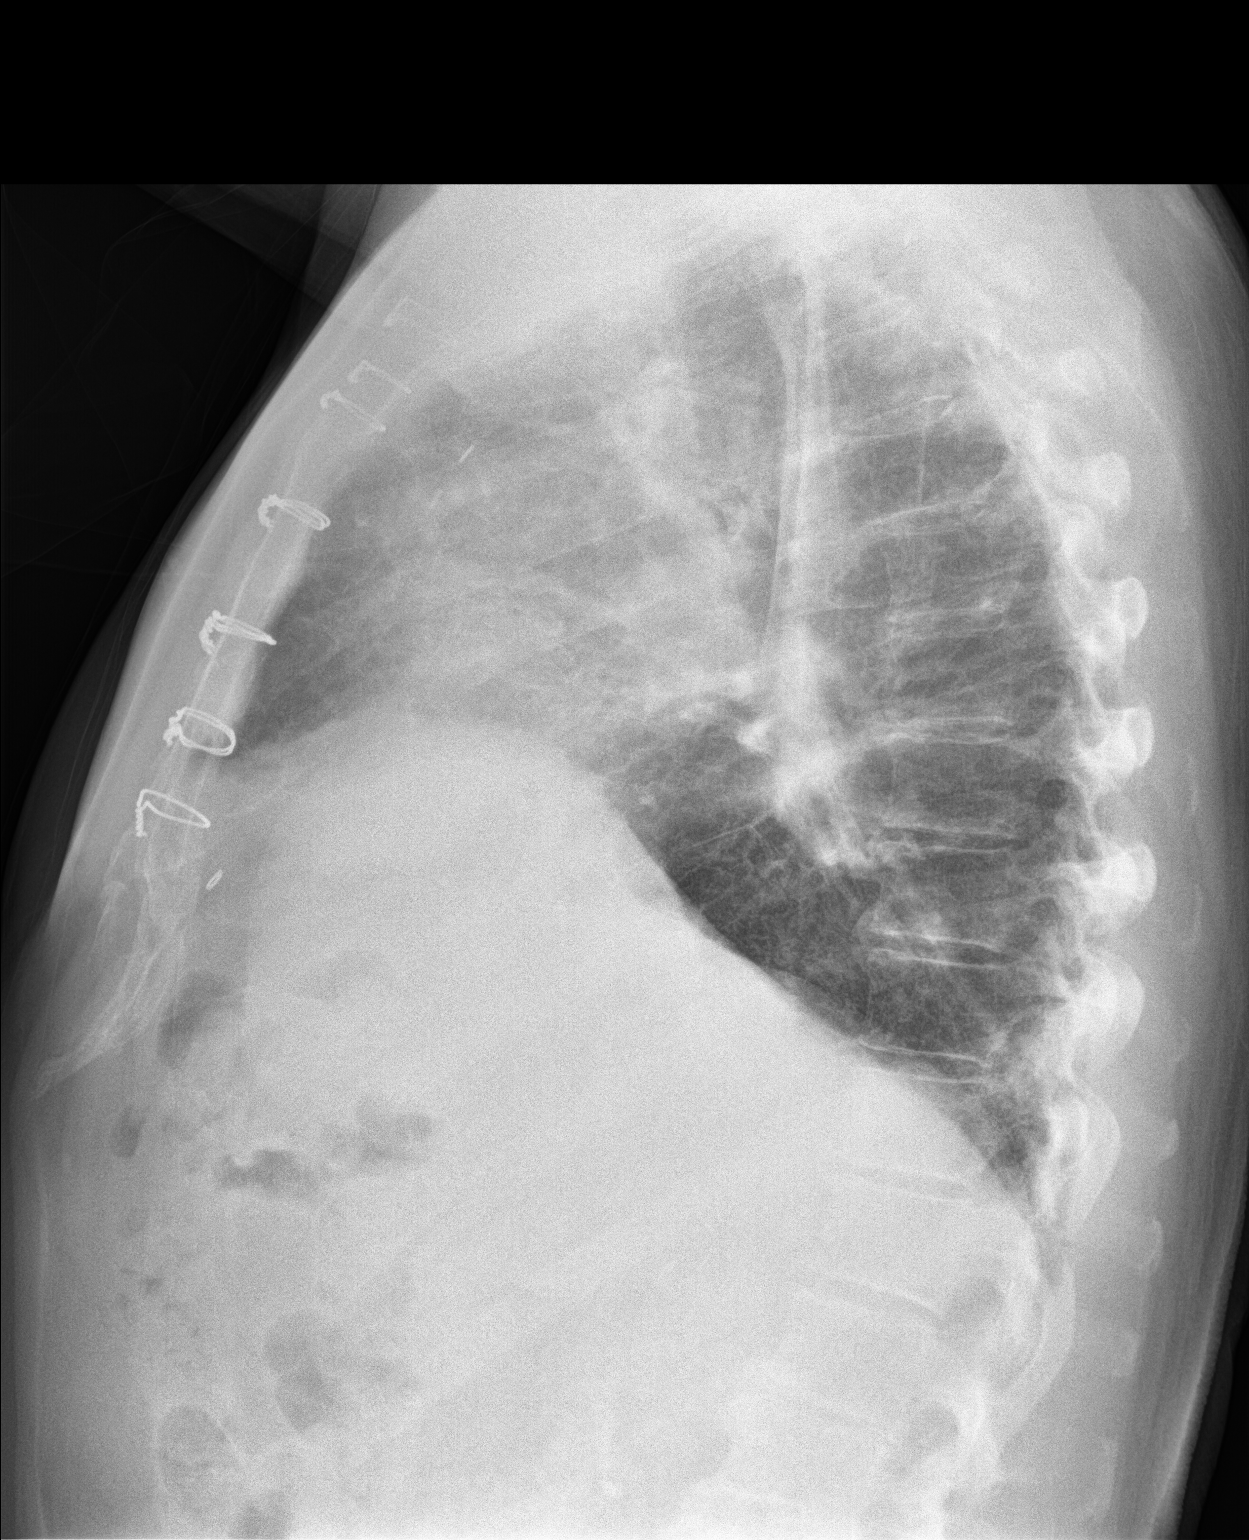

[2 of 2 positions shown; findings below may reference images not displayed]

FINDINGS: Diffuse bilateral interstitial thickening. No pleural effusion or
pneumothorax. Stable cardiomegaly. Prior median sternotomy. No acute
osseous abnormality.
IMPRESSION: Cardiomegaly with pulmonary vascular congestion.

## 2018-07-16 ENCOUNTER — Telehealth: Payer: Self-pay | Admitting: Family Medicine

## 2018-07-16 NOTE — Telephone Encounter (Signed)
Copied from [place] [phone]. Topic: Quick Communication - See Telephone Encounter >> Jul 16, 2018 10:47 AM Neva Seat wrote: [name] is requesting a list of medications pt is taking. Please email to:    [email or web address]

## 2018-07-16 NOTE — Telephone Encounter (Signed)
Brian Bass notified by telephone that we do not have the capability to e-mail the medication list.  I have printed off a copy of Brian Bass's medication list and Brian Bass will pick it up at the front desk instead.

## 2018-08-12 ENCOUNTER — Telehealth: Payer: Self-pay | Admitting: Family Medicine

## 2018-08-12 DIAGNOSIS — E1165 Type 2 diabetes mellitus with hyperglycemia: Principal | ICD-10-CM

## 2018-08-12 DIAGNOSIS — IMO0001 Reserved for inherently not codable concepts without codable children: Secondary | ICD-10-CM

## 2018-08-12 NOTE — Telephone Encounter (Signed)
-----  Message from Lendon Collar, RT sent at 08/08/2018  6:35 PM EDT ----- Regarding: Lab orders for Friday 08/17/18 Please enter 2monthfollow up labs for 08/17/18. Thanks!

## 2018-08-17 ENCOUNTER — Other Ambulatory Visit (INDEPENDENT_AMBULATORY_CARE_PROVIDER_SITE_OTHER): Payer: Medicare HMO

## 2018-08-17 DIAGNOSIS — E1165 Type 2 diabetes mellitus with hyperglycemia: Secondary | ICD-10-CM

## 2018-08-17 DIAGNOSIS — IMO0001 Reserved for inherently not codable concepts without codable children: Secondary | ICD-10-CM

## 2018-08-17 LAB — COMPREHENSIVE METABOLIC PANEL
ALK PHOS: 60 U/L (ref 39–117)
ALT: 20 U/L (ref 0–53)
AST: 18 U/L (ref 0–37)
Albumin: 4.3 g/dL (ref 3.5–5.2)
BUN: 10 mg/dL (ref 6–23)
CO2: 24 mEq/L (ref 19–32)
Calcium: 9.7 mg/dL (ref 8.4–10.5)
Chloride: 104 mEq/L (ref 96–112)
Creatinine, Ser: 1.04 mg/dL (ref 0.40–1.50)
GFR: 73.38 mL/min (ref >60.00)
GLUCOSE: 78 mg/dL (ref 70–99)
POTASSIUM: 4.5 meq/L (ref 3.5–5.1)
Sodium: 137 mEq/L (ref 135–145)
TOTAL PROTEIN: 7.3 g/dL (ref 6.0–8.3)
Total Bilirubin: 0.5 mg/dL (ref 0.2–1.2)

## 2018-08-17 LAB — LIPID PANEL
Cholesterol: 106 mg/dL (ref 0–200)
HDL: 36.9 mg/dL — ABNORMAL LOW (ref >39.00)
LDL Cholesterol: 47 mg/dL (ref 0–99)
NonHDL: 69.59
Total CHOL/HDL Ratio: 3
Triglycerides: 111 mg/dL (ref 0.0–149.0)
VLDL: 22.2 mg/dL (ref 0.0–40.0)

## 2018-08-17 LAB — HEMOGLOBIN A1C: HEMOGLOBIN A1C: 7.6 % — AB (ref 4.6–6.5)

## 2018-08-22 ENCOUNTER — Telehealth: Payer: Self-pay | Admitting: *Deleted

## 2018-08-22 NOTE — Telephone Encounter (Signed)
Copied from [place] [id number]. Topic: Inquiry >> Aug 22, 2018 10:07 AM Bea Graff, NT wrote: Reason for CRM: Pts wife would like a call from [name] to discuss pts appt that is scheduled for Friday.

## 2018-08-22 NOTE — Telephone Encounter (Signed)
Copied from [place] [phone]. Topic: Inquiry >> Aug 22, 2018 10:07 AM Bea Graff, NT wrote: Reason for CRM: Pts wife would like a call from [name] to discuss pts appt that is scheduled for Friday. >> Aug 22, 2018  3:16 PM Yvette Rack wrote: Pt wife [name] returning call back about appt please call at [phone] >> Aug 22, 2018  3:48 PM Helene Shoe, LPN wrote: I spoke with Mrs Garnette Gunner and she prefers to speak with [name] CMA because she is not going to be able to come with pt for appt on 08/24/18. Mrs Garnette Gunner request cb 08/22/18 or 08/23/18.

## 2018-08-22 NOTE — Telephone Encounter (Signed)
Spoke with Mrs. Conaster.  She will not be able to attend Blayne's appointment on Friday and wanted to ask Dr. Diona Browner to look into patient is currently on Esbriet for Pulmonary Fibrosis.  She states he is suppose to take it three times a day but currently he is doing two times a day because it hurts his stomach.  She hope to get him up to 3 times a day eventually.  But one of the side effects is that it can effect your liver so [name] wanted to make sure labs are done to check his liver function.  She is also wanting to know how often his liver function should be checked.  He sees Dr. Diona Browner every 6 months but she didn't know if it needs to be checked more than every 6 months.  I did advise her that a CMET was checked on 08/17/2018 and his liver function were normal.  She also wants to make sure Juanjesus gets his flu shot and pneumonia shot if one is needed.  I advised he is only due for a flu vaccine and I will definitely take care of that on Friday.  Mr. Garnette Gunner also has his yearly eye exam scheduled for 10/05/18.  I reminder her that when they go in for his eye exam to make sure they request the eye doctor to send a copy of his office note to Dr. Diona Browner.

## 2018-08-23 NOTE — Telephone Encounter (Signed)
Noted.

## 2018-08-24 ENCOUNTER — Ambulatory Visit (INDEPENDENT_AMBULATORY_CARE_PROVIDER_SITE_OTHER): Payer: Medicare HMO | Admitting: Family Medicine

## 2018-08-24 ENCOUNTER — Encounter: Payer: Self-pay | Admitting: Family Medicine

## 2018-08-24 ENCOUNTER — Ambulatory Visit: Payer: Medicare HMO | Admitting: Family Medicine

## 2018-08-24 VITALS — BP 110/74 | HR 86 | Temp 97.5°F | Ht 66.0 in | Wt 164.5 lb

## 2018-08-24 DIAGNOSIS — E1165 Type 2 diabetes mellitus with hyperglycemia: Secondary | ICD-10-CM | POA: Diagnosis not present

## 2018-08-24 DIAGNOSIS — E7800 Pure hypercholesterolemia, unspecified: Secondary | ICD-10-CM

## 2018-08-24 DIAGNOSIS — R630 Anorexia: Secondary | ICD-10-CM | POA: Diagnosis not present

## 2018-08-24 DIAGNOSIS — IMO0001 Reserved for inherently not codable concepts without codable children: Secondary | ICD-10-CM

## 2018-08-24 DIAGNOSIS — R634 Abnormal weight loss: Secondary | ICD-10-CM | POA: Diagnosis not present

## 2018-08-24 DIAGNOSIS — R531 Weakness: Secondary | ICD-10-CM | POA: Diagnosis not present

## 2018-08-24 DIAGNOSIS — Z23 Encounter for immunization: Secondary | ICD-10-CM

## 2018-08-24 LAB — CBC WITH DIFFERENTIAL/PLATELET
BASOS PCT: 0.3 % (ref 0.0–3.0)
Basophils Absolute: 0 10*3/uL (ref 0.0–0.1)
EOS PCT: 2.2 % (ref 0.0–5.0)
Eosinophils Absolute: 0.2 10*3/uL (ref 0.0–0.7)
HEMATOCRIT: 40 % (ref 39.0–52.0)
HEMOGLOBIN: 13.9 g/dL (ref 13.0–17.0)
LYMPHS PCT: 24.4 % (ref 12.0–46.0)
Lymphs Abs: 2.4 10*3/uL (ref 0.7–4.0)
MCHC: 34.7 g/dL (ref 30.0–36.0)
MCV: 87 fl (ref 78.0–100.0)
MONOS PCT: 10.1 % (ref 3.0–12.0)
Monocytes Absolute: 1 10*3/uL (ref 0.1–1.0)
Neutro Abs: 6.1 10*3/uL (ref 1.4–7.7)
Neutrophils Relative %: 63 % (ref 43.0–77.0)
PLATELETS: 241 10*3/uL (ref 150.0–400.0)
RBC: 4.6 Mil/uL (ref 4.22–5.81)
RDW: 13.1 % (ref 11.5–15.5)
WBC: 9.7 10*3/uL (ref 4.0–10.5)

## 2018-08-24 LAB — T3, FREE: T3 FREE: 3 pg/mL (ref 2.3–4.2)

## 2018-08-24 LAB — VITAMIN B12: Vitamin B-12: 292 pg/mL (ref 211–911)

## 2018-08-24 LAB — TSH: TSH: 2.53 u[IU]/mL (ref 0.35–4.50)

## 2018-08-24 LAB — T4, FREE: Free T4: 0.7 ng/dL (ref 0.60–1.60)

## 2018-08-24 LAB — VITAMIN D 25 HYDROXY (VIT D DEFICIENCY, FRACTURES): VITD: 41.3 ng/mL (ref 30.00–100.00)

## 2018-08-24 LAB — MAGNESIUM: MAGNESIUM: 2.1 mg/dL (ref 1.5–2.5)

## 2018-08-24 NOTE — Progress Notes (Signed)
Subjective:    Patient ID: [name], male    DOB: 09-Nov-1941, 77 y.o.   MRN: [id number]  HPI  77 year old male presents for follow up DM.  He is also being treated for pulmonary fibrosis by Pulmonary.  His wife left a message stating  "patient is currently on Esbriet for Pulmonary Fibrosis.  She states he is suppose to take it three times a day but currently he is doing two times a day because it hurts his stomach.  She hope to get him up to 3 times a day eventually. "  He needs LFTS every 3 months on Esbriet. He is due for flu shot.   Diabetes:  Improved control of DM on current regimen: lantus, glipizide, metformin ( has not bee taking)  He has not been eating given esbriet. Lab Results  Component Value Date   HGBA1C 7.6 (H) 08/17/2018  Using medications without difficulties: Hypoglycemic episodes: Hyperglycemic episodes: Feet problems: Blood Sugars averaging: 78 occ running low at night.. Needing food. eye exam within last year:  Wt Readings from Last 3 Encounters:  08/24/18 164 lb 8 oz (74.6 kg)  05/11/18 167 lb 6.4 oz (75.9 kg)  03/06/18 175 lb (79.4 kg)    Sodium nml now.  High cholesterol: LDL at goal on zetia, zocor and fenofibrate Lab Results  Component Value Date   CHOL 106 08/17/2018   HDL 36.90 (L) 08/17/2018   LDLCALC 47 08/17/2018   LDLDIRECT 69.0 05/08/2018   TRIG 111.0 08/17/2018   CHOLHDL 3 08/17/2018    Review of Systems  Constitutional: Positive for appetite change, fatigue and unexpected weight change. Negative for fever.  HENT: Negative for ear pain.   Eyes: Negative for pain.  Respiratory: Negative for choking.   Cardiovascular: Negative for chest pain, palpitations and leg swelling.  Endocrine: Negative for polydipsia.  Genitourinary: Negative for dysuria.       Objective:   Physical Exam  Constitutional: Vital signs are normal. He appears well-developed and well-nourished.  HENT:  Head: Normocephalic.  Right Ear:  Hearing normal.  Left Ear: Hearing normal.  Nose: Nose normal.  Mouth/Throat: Oropharynx is clear and moist and mucous membranes are normal.  Neck: Trachea normal. Carotid bruit is not present. No thyroid mass and no thyromegaly present.  Cardiovascular: Normal rate, regular rhythm and normal pulses. Exam reveals no gallop, no distant heart sounds and no friction rub.  No murmur heard. No peripheral edema  Pulmonary/Chest: Effort normal. No respiratory distress. He has rales in the right lower field and the left lower field.  Musculoskeletal:       Lumbar back: Normal. He exhibits normal range of motion, no tenderness and no bony tenderness.  Neurological: He is alert. He has normal strength. No sensory deficit. He displays a negative Romberg sign. Gait normal.  Skin: Skin is warm, dry and intact. No rash noted.  Psychiatric: He has a normal mood and affect. His speech is normal and behavior is normal. Thought content normal.          Assessment & Plan:

## 2018-08-24 NOTE — Assessment & Plan Note (Signed)
?  Secondary to esbriet.  Will eval with labs. No sign of leg weakness on exam.. Seems to be more all over weakness, but pt reports issue getting legs to move. May need spine eval.

## 2018-08-24 NOTE — Assessment & Plan Note (Signed)
Well controlled. Continue current medication.  

## 2018-08-24 NOTE — Patient Instructions (Addendum)
Start a bedtime snack to avoid dropping low.  Stop bedtime glipizide.  Continue insulin at 55 UNItS BID.  Call if blood sugars still dropping low at night.   Please stop at the lab to have labs drawn.

## 2018-08-24 NOTE — Assessment & Plan Note (Addendum)
IMproved control but pt not eating given SE to esbriet.  Dropping low likely in middle of night: Start a bedtime snack to avoid dropping low.  Stop bedtime glipizide.  Continue insulin at 55 UNItS BID.

## 2018-08-25 LAB — PREALBUMIN: Prealbumin: 26 mg/dL (ref 21–43)

## 2018-09-28 ENCOUNTER — Other Ambulatory Visit: Payer: Self-pay | Admitting: Family Medicine

## 2018-09-28 NOTE — Telephone Encounter (Signed)
Last office visit 08/24/2018.  Next Appt: 11/23/2018 for 3 month follow up. Last refilled 05/11/2018 for #20 with no refills.  Ok to refill?

## 2018-10-01 NOTE — Progress Notes (Signed)
[facility] Consultation      Assessment and Plan:  Interstitial lung disease-suspect idiopathic pulmonary fibrosis. - Again discussed with patient and wife in regards to what his suspected outlook would be. --Patient has been started on Esbriet, he is having trouble tolerating the full dose, is currently only taking 2 pills 3 times daily. -Last liver function test 1 month ago normal, will need to recheck at next visit. - Patient's wife had several questions about stem cell.  We had a long discussion that currently there is no good research showing that stem cell treatment is beneficial. Any legitimate research will not charge them and they should be very cautious about "Institutes" which charge large sums of money for research. I will therefore refer the patient to [facility] to see if there is a research trial to participate int.  --Will check overnight oximetry to see if oxygen is required.   Dyspnea on exertion. -Progressive dyspnea on exertion, likely secondary to above.  Coronary artery disease. -Myocardial perfusion study showed possible anterior ischemia, uncertain if this is contributing to dyspnea (but given underlying pulmonary fibrosis appears doubtful), cardiology is currently working the patient up.  Orders Placed This Encounter  Procedures  . Pulse oximetry, overnight     Return in about 3 months (around 01/02/2019).   Greater than 50% of the 40 minute visit was spent in counseling/coordination of care regarding pulmonary fibrosis prognosis, treatment options, complications.     Date: 10/01/2018  MRN# [id number] Brian Bass 05-Jan-1941  Referring Physician: Dr. Eben Burow is a 77 y.o. old male seen in consultation for chief complaint of:    Chief Complaint  Patient presents with  . Shortness of Breath    worsening with activity:pt is taking Esbriet   . Cough    worsened with lots of mucus yellow.    HPI:  Patient is  a 77 year old male with history of interstitial lung disease.  Last visit he was noted to have progressive dyspnea on exertion, particularly after a fall with some broken ribs.  His CT findings were suggestive of idiopathic pulmonary fibrosis.  We discussed starting on a anti-fibrotic agent though he was skeptical of starting these. He ultimately opted to try Esbriet, which she is currently taking 2 tablets 3 times a day. He tried to take 3 tabs but he developed stomach pains, he had a bit of nausea and diarrhea, but the main problem was stomach pain on the right side. This went away when he went back to 2 tabs at a time.   He continues to have coughing, he is taking tessalon, prn but not taking regularly. He feels that his breathing has declined a bit since the last visit, but he continues to he active walking his dog.   They check his oxygen at home and it usually runs around high 90's.    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 He has never smoked his wife smoked for 15 years.   Per CT chest: There are calcifications of the aortic valve. Echocardiographic correlation for evaluation of potential valvular dysfunction may be warranted if clinically indicated.  **Imaging personally reviewed, chest x-ray 02/08/18; diffuse groundglass and interstitial changes throughout both lungs, right diaphragm is elevated.  Interstitial changes has progressed significantly since previous chest x-ray on 09/06/13.  CT chest 10/21/13, bilateral emphysematous changes, bilateral  subpleural reticular changes consistent with pulmonary fibrosis.  CT high-resolution 02/27/18, the bibasilar fibrotic changes have advanced significantly in the subpleural areas of traction bronchiectasis changes have worsened.  Right paratracheal lymphadenopathy minimally changed  **PFT tracings personally reviewed, 02/27/18 FVC is 63%  predicted, FEV1 of 70% predicted, ratio 86% predicted.  There is no significant change with bronchodilator therapy.  Flow volume loop is consistent with restriction. DLCO is 54% predicted, DLCO is reduced at 56%. -Overall this test consistent with moderate restrictive lung disease.  **Serology 02/27/18; ACE, rheumatoid factor, ANA negative.    Medication:    Current Outpatient Medications:  .  ACCU-CHEK FASTCLIX LANCETS MISC, 1 Device by Other route 3 (three) times daily. Check blood sugars 2 to 3 times daily.  Dx: 250.02, Disp: 102 each, Rfl: 5 .  aspirin 81 MG tablet, Take 81 mg by mouth daily., Disp: , Rfl:  .  BD INSULIN SYRINGE U/F 31G X 5/16" 1 ML MISC, USE TO INJECT LANTUS INSULIN 2 TIMES DAILY, Disp: 180 each, Rfl: 3 .  benzonatate (TESSALON) 200 MG capsule, TAKE 1 CAPSULE BY MOUTH TWICE DAILY AS NEEDED FOR COUGH, Disp: 20 capsule, Rfl: 0 .  Blood Glucose Monitoring Suppl (ACCU-CHEK NANO SMARTVIEW) W/DEVICE KIT, 1 kit by Other route 3 (three) times daily. Use to check blood sugars 2 to 3 times daily.  Dx: 250.02, Disp: 1 kit, Rfl: 0 .  colchicine 0.6 MG tablet, For flare take 2 tab x 1, repeat 1 tabslet in 2 hours if pain continues, then one tablet daily, Disp: 60 tablet, Rfl: 0 .  ezetimibe (ZETIA) 10 MG tablet, Take 1 tablet (10 mg total) by mouth daily., Disp: 90 tablet, Rfl: 3 .  fenofibrate 160 MG tablet, Take 1 tablet (160 mg total) by mouth daily., Disp: 90 tablet, Rfl: 1 .  furosemide (LASIX) 20 MG tablet, Take 1 tablet (20 mg total) by mouth daily., Disp: 10 tablet, Rfl: 0 .  glucose blood (ACCU-CHEK SMARTVIEW) test strip, USE TO CHECK BLOOD SUGARS 2 TO 3 TIMES DAILY.  Dx: E11.65, Disp: 300 each, Rfl: 3 .  insulin glargine (LANTUS) 100 UNIT/ML injection, INJECT 55 UNITS SUBCUTANEOUSLY TWICE DAILY, Disp: 100 mL, Rfl: 3 .  meloxicam (MOBIC) 15 MG tablet, Take 1 tablet (15 mg total) by mouth daily. (Patient taking differently: Take 15 mg by mouth as needed. ), Disp: 90 tablet, Rfl:  1 .  Multiple Vitamin (MULTIVITAMIN) tablet, Take 1 tablet by mouth daily.  , Disp: , Rfl:  .  Omega-3 Fatty Acids (FISH OIL) 1200 MG CAPS, Take 1 capsule by mouth daily., Disp: , Rfl:  .  omeprazole (PRILOSEC) 20 MG capsule, TAKE 1 CAPSULE EVERY DAY, Disp: 90 capsule, Rfl: 3 .  Pirfenidone (ESBRIET) 267 MG TABS, Take 3 tablets (801 mg total) by mouth 3 (three) times daily with meals., Disp: 270 tablet, Rfl: 11 .  simvastatin (ZOCOR) 40 MG tablet, TAKE 1 TABLET AT BEDTIME, Disp: 90 tablet, Rfl: 3 .  tamsulosin (FLOMAX) 0.4 MG CAPS capsule, 1 tablet daily, if not improving increase to 2 tabs daily., Disp: 60 capsule, Rfl: 3   Allergies:  Patient has no allergy information on record.   Review of Systems:  Constitutional: Feels well. Cardiovascular: Denies chest pain, exertional chest pain.  Pulmonary: Denies hemoptysis, pleuritic chest pain.   The remainder of systems were reviewed and were found to be negative other than what is documented in the HPI.    Physical Examination:   VS: BP 112/70 (BP Location:  Left Arm, Cuff Size: Normal)   Pulse 79   Ht _0  (1.676 m)   SpO2 (!) 79%   BMI 26.55 kg/m   General Appearance: No distress  Neuro:without focal findings, mental status, speech normal, alert and oriented HEENT: PERRLA, EOM intact Pulmonary: bilateral crackles, worst in the right base.  CardiovascularNormal S1,S2.  No m/r/g.  Abdomen: Benign, Soft, non-tender, No masses Renal:  No costovertebral tenderness  GU:  No performed at this time. Endoc: No evident thyromegaly, no signs of acromegaly or Cushing features Skin:   warm, no rashes, no ecchymosis  Extremities: normal, no cyanosis, clubbing.    LABORATORY PANEL:   CBC No results for input(s): WBC, HGB, HCT, PLT in the last 168 hours. ------------------------------------------------------------------------------------------------------------------  Chemistries  No results for input(s): NA, K, CL, CO2, GLUCOSE, BUN,  CREATININE, CALCIUM, MG, AST, ALT, ALKPHOS, BILITOT in the last 168 hours.  Invalid input(s): GFRCGP ------------------------------------------------------------------------------------------------------------------  Cardiac Enzymes No results for input(s): TROPONINI in the last 168 hours. ------------------------------------------------------------  RADIOLOGY:  No results found.     Thank  you for the consultation and for allowing [facility] to assist in the care of your patient. Our recommendations are noted above.  Please contact us if we can be of further service.  Marda Stalker, M.D., F.C.C.P.  Board Certified in Internal Medicine, Pulmonary Medicine, [place], and Sleep Medicine.   Pulmonary and Critical Care Office Number: [phone]   10/01/2018

## 2018-10-02 ENCOUNTER — Encounter: Payer: Self-pay | Admitting: Internal Medicine

## 2018-10-02 ENCOUNTER — Ambulatory Visit: Payer: Medicare HMO | Admitting: Internal Medicine

## 2018-10-02 VITALS — BP 112/70 | HR 79 | Ht 66.0 in

## 2018-10-02 DIAGNOSIS — J84112 Idiopathic pulmonary fibrosis: Secondary | ICD-10-CM | POA: Diagnosis not present

## 2018-10-02 MED ORDER — BENZONATATE 100 MG PO CAPS: 200.0000 mg | ORAL_CAPSULE | Freq: Three times a day (TID) | ORAL | 2 refills | Status: DC

## 2018-10-02 NOTE — Patient Instructions (Addendum)
Start using Tessalon, 2 tablets 3 times daily.   Continue Esbriet 2 tablets 3 times daily, try to increase to 3 tablets if possible.  Check out the pulmonary fibrosis foundation for information on pulmonary fibrosis including clinical trials in this area. Www.pulmonaryfibrosis.org

## 2018-10-10 DIAGNOSIS — J84112 Idiopathic pulmonary fibrosis: Secondary | ICD-10-CM | POA: Diagnosis not present

## 2018-10-10 DIAGNOSIS — E119 Type 2 diabetes mellitus without complications: Secondary | ICD-10-CM | POA: Diagnosis not present

## 2018-10-10 LAB — HM DIABETES EYE EXAM

## 2018-10-12 ENCOUNTER — Encounter: Payer: Self-pay | Admitting: Family Medicine

## 2018-10-14 ENCOUNTER — Encounter: Payer: Self-pay | Admitting: *Deleted

## 2018-10-14 ENCOUNTER — Emergency Department
Admission: EM | Admit: 2018-10-14 | Discharge: 2018-10-14 | Disposition: A | Discharge status: Home / Self Care | Payer: Medicare HMO | Attending: Emergency Medicine | Admitting: Emergency Medicine

## 2018-10-14 ENCOUNTER — Other Ambulatory Visit: Payer: Self-pay

## 2018-10-14 ENCOUNTER — Emergency Department: Payer: Medicare HMO

## 2018-10-14 DIAGNOSIS — Z794 Long term (current) use of insulin: Secondary | ICD-10-CM | POA: Insufficient documentation

## 2018-10-14 DIAGNOSIS — Z7982 Long term (current) use of aspirin: Secondary | ICD-10-CM | POA: Diagnosis not present

## 2018-10-14 DIAGNOSIS — R5381 Other malaise: Secondary | ICD-10-CM | POA: Insufficient documentation

## 2018-10-14 DIAGNOSIS — R0789 Other chest pain: Secondary | ICD-10-CM | POA: Insufficient documentation

## 2018-10-14 DIAGNOSIS — I2510 Atherosclerotic heart disease of native coronary artery without angina pectoris: Secondary | ICD-10-CM | POA: Diagnosis not present

## 2018-10-14 DIAGNOSIS — E1122 Type 2 diabetes mellitus with diabetic chronic kidney disease: Secondary | ICD-10-CM | POA: Diagnosis not present

## 2018-10-14 DIAGNOSIS — R112 Nausea with vomiting, unspecified: Secondary | ICD-10-CM

## 2018-10-14 DIAGNOSIS — I129 Hypertensive chronic kidney disease with stage 1 through stage 4 chronic kidney disease, or unspecified chronic kidney disease: Secondary | ICD-10-CM | POA: Insufficient documentation

## 2018-10-14 DIAGNOSIS — M791 Myalgia, unspecified site: Secondary | ICD-10-CM | POA: Diagnosis not present

## 2018-10-14 DIAGNOSIS — Z79899 Other long term (current) drug therapy: Secondary | ICD-10-CM | POA: Diagnosis not present

## 2018-10-14 DIAGNOSIS — R109 Unspecified abdominal pain: Secondary | ICD-10-CM | POA: Diagnosis present

## 2018-10-14 DIAGNOSIS — K5730 Diverticulosis of large intestine without perforation or abscess without bleeding: Secondary | ICD-10-CM | POA: Diagnosis not present

## 2018-10-14 DIAGNOSIS — N189 Chronic kidney disease, unspecified: Secondary | ICD-10-CM | POA: Insufficient documentation

## 2018-10-14 HISTORY — DX: Pulmonary fibrosis, unspecified: J84.10

## 2018-10-14 LAB — CBC WITH DIFFERENTIAL/PLATELET
ABS IMMATURE GRANULOCYTES: 0.09 10*3/uL — AB (ref 0.00–0.07)
Basophils Absolute: 0 10*3/uL (ref 0.0–0.1)
Basophils Relative: 0 %
EOS PCT: 2 %
Eosinophils Absolute: 0.2 10*3/uL (ref 0.0–0.5)
HEMATOCRIT: 39.5 % (ref 39.0–52.0)
HEMOGLOBIN: 13.5 g/dL (ref 13.0–17.0)
Immature Granulocytes: 1 %
LYMPHS ABS: 3.1 10*3/uL (ref 0.7–4.0)
LYMPHS PCT: 31 %
MCH: 29.9 pg (ref 26.0–34.0)
MCHC: 34.2 g/dL (ref 30.0–36.0)
MCV: 87.4 fL (ref 80.0–100.0)
MONO ABS: 0.8 10*3/uL (ref 0.1–1.0)
Monocytes Relative: 8 %
NEUTROS ABS: 5.7 10*3/uL (ref 1.7–7.7)
NRBC: 0 % (ref 0.0–0.2)
Neutrophils Relative %: 58 %
Platelets: 204 10*3/uL (ref 150–400)
RBC: 4.52 MIL/uL (ref 4.22–5.81)
RDW: 12.4 % (ref 11.5–15.5)
WBC: 9.9 10*3/uL (ref 4.0–10.5)

## 2018-10-14 LAB — URINALYSIS, COMPLETE (UACMP) WITH MICROSCOPIC
BACTERIA UA: NONE SEEN
BILIRUBIN URINE: NEGATIVE
Ketones, ur: 5 mg/dL — AB
LEUKOCYTES UA: NEGATIVE
NITRITE: NEGATIVE
PH: 6 (ref 5.0–8.0)
Protein, ur: NEGATIVE mg/dL
RBC / HPF: >50 RBC/hpf — ABNORMAL HIGH (ref 0–5)
SPECIFIC GRAVITY, URINE: 1.009 (ref 1.005–1.030)

## 2018-10-14 LAB — COMPREHENSIVE METABOLIC PANEL
ALBUMIN: 4.2 g/dL (ref 3.5–5.0)
ALT: 28 U/L (ref 0–44)
ANION GAP: 11 (ref 5–15)
AST: 28 U/L (ref 15–41)
Alkaline Phosphatase: 55 U/L (ref 38–126)
BILIRUBIN TOTAL: 0.6 mg/dL (ref 0.3–1.2)
BUN: 15 mg/dL (ref 8–23)
CHLORIDE: 98 mmol/L (ref 98–111)
CO2: 23 mmol/L (ref 22–32)
Calcium: 9.1 mg/dL (ref 8.9–10.3)
Creatinine, Ser: 1.14 mg/dL (ref 0.61–1.24)
GFR calc Af Amer: >60 mL/min (ref >60)
GFR calc non Af Amer: 60 mL/min — ABNORMAL LOW (ref >60)
Glucose, Bld: 248 mg/dL — ABNORMAL HIGH (ref 70–99)
POTASSIUM: 3.8 mmol/L (ref 3.5–5.1)
SODIUM: 132 mmol/L — AB (ref 135–145)
Total Protein: 7.4 g/dL (ref 6.5–8.1)

## 2018-10-14 LAB — LIPASE, BLOOD: Lipase: 28 U/L (ref 11–51)

## 2018-10-14 LAB — LACTIC ACID, PLASMA
Lactic Acid, Venous: 2.7 mmol/L (ref 0.5–1.9)
Lactic Acid, Venous: 2 mmol/L (ref 0.5–1.9)

## 2018-10-14 LAB — INFLUENZA PANEL BY PCR (TYPE A & B)
Influenza A By PCR: NEGATIVE
Influenza B By PCR: NEGATIVE

## 2018-10-14 LAB — TROPONIN I: Troponin I: <0.03 ng/mL (ref <0.03)

## 2018-10-14 LAB — GLUCOSE, CAPILLARY: GLUCOSE-CAPILLARY: 204 mg/dL — AB (ref 70–99)

## 2018-10-14 MED ORDER — SODIUM CHLORIDE 0.9 % IV BOLUS
1000.0000 mL | Freq: Once | INTRAVENOUS | Status: AC
  Administered 2018-10-14: 1000 mL via INTRAVENOUS

## 2018-10-14 MED ORDER — ONDANSETRON HCL 4 MG/2ML IJ SOLN
4.0000 mg | Freq: Once | INTRAMUSCULAR | Status: AC
  Administered 2018-10-14: 4 mg via INTRAVENOUS
  Filled 2018-10-14: qty 2

## 2018-10-14 MED ORDER — MORPHINE SULFATE (PF) 4 MG/ML IV SOLN
4.0000 mg | Freq: Once | INTRAVENOUS | Status: DC
  Filled 2018-10-14: qty 1

## 2018-10-14 MED ORDER — MORPHINE SULFATE (PF) 2 MG/ML IV SOLN
2.0000 mg | Freq: Once | INTRAVENOUS | Status: AC
  Administered 2018-10-14: 2 mg via INTRAVENOUS
  Filled 2018-10-14: qty 1

## 2018-10-14 MED ORDER — IOPAMIDOL (ISOVUE-300) INJECTION 61%
100.0000 mL | Freq: Once | INTRAVENOUS | Status: AC | PRN
  Administered 2018-10-14: 100 mL via INTRAVENOUS

## 2018-10-14 MED ORDER — OXYCODONE-ACETAMINOPHEN 5-325 MG PO TABS: 1.0000 | ORAL_TABLET | ORAL | 0 refills | Status: DC | PRN

## 2018-10-14 MED ORDER — SODIUM CHLORIDE 0.9 % IV SOLN
1.0000 g | Freq: Once | INTRAVENOUS | Status: AC
  Administered 2018-10-14: 1 g via INTRAVENOUS
  Filled 2018-10-14: qty 10

## 2018-10-14 MED ORDER — ONDANSETRON 4 MG PO TBDP: 4.0000 mg | ORAL_TABLET | Freq: Three times a day (TID) | ORAL | 0 refills | Status: DC | PRN

## 2018-10-14 MED ORDER — MORPHINE SULFATE (PF) 2 MG/ML IV SOLN
2.0000 mg | Freq: Once | INTRAVENOUS | Status: AC
  Administered 2018-10-14: 2 mg via INTRAVENOUS

## 2018-10-14 MED ORDER — CEPHALEXIN 500 MG PO CAPS: 500.0000 mg | ORAL_CAPSULE | Freq: Two times a day (BID) | ORAL | 0 refills | Status: DC

## 2018-10-14 MED ORDER — IOPAMIDOL (ISOVUE-300) INJECTION 61%
30.0000 mL | Freq: Once | INTRAVENOUS | Status: AC | PRN
  Administered 2018-10-14: 30 mL via ORAL

## 2018-10-14 NOTE — ED Provider Notes (Signed)
-----------------------------------------   11:49 AM on 10/14/2018 -----------------------------------------  Patient's work-up shows initial elevation of lactic acid and.  Urinalysis is equivocal but possibly concerning for urinary tract infection started the patient on IV Rocephin and send a urine culture.  Patient received IV hydration and nausea medication.  States he is feeling much better no longer feeling nauseous no longer feeling weak but he continues to have severe pain of the right posterior back from falling this morning.  CT scan of the chest abdomen pelvis is essentially negative besides an incidental enlarged aortic aneurysm which I discussed with the family as well as follow-up in approximately 3 years for repeat imaging.  I offered the patient given his initial weakness and elevated lactic acid admission to the hospital, family wishes to go home.  We will discharge with Keflex, pain medication, incentive spirometry.  Patient will follow-up with his doctor in the next 2 to 3 days for recheck.  I discussed with the patient if he continues to have any weakness, develops a fever, he is to return to the emergency department for further evaluation.  Patient and family agreeable to this plan of care.  Patient's repeat lactic acid after liter of fluids is down to 2.0.  Given more fluids.  Patient will be discharged after fluids complete.   Harvest Dark, MD 10/14/18 1151

## 2018-10-14 NOTE — ED Notes (Addendum)
Pt had a flu shot this year

## 2018-10-14 NOTE — ED Triage Notes (Signed)
Per EMS was called with complaints of nausea, weakness and blood sugar concerns. Pt is weepy and states he feels sick all over and just so weak. Pt took 2 glucose tabs because he thought his sugar was low but by EMS it was 160. Pt also got up and felt very weak and 'fainted" but didn't loose consciousness. Pt hurt his ribs on his right side when he slipped. Pt is presently nauseated and has vomited 3 times in the past few hours. Pt has had no diarrhea and had a BM yesterday.

## 2018-10-14 NOTE — ED Notes (Signed)
Patient transported to CT 

## 2018-10-14 NOTE — ED Provider Notes (Signed)
[facility] Emergency Department Provider Note  ____________________________________________  Time seen: Approximately 6:41 AM  I have reviewed the triage vital signs and the nursing notes.   HISTORY  Chief Complaint Nausea    HPI Brian Bass is a 77 y.o. male with a history of diabetes esophageal stricture and IBS who complains of feeling "sick" for the past 2 to 3 hours.  He reports that he is thrown up 3 times.  No diarrhea.  Last bowel movement was yesterday morning.  Is eating and drinking normally up until dinnertime last night when he had some fish and a baked potato.  Denies any abdominal pain chest pain shortness of breath or fever.  He did have a mechanical fall in his bathroom today and hit his right chest wall and has pain there.      Past Medical History:  Diagnosis Date  . Adenomatous colon polyp   . Allergy   . Arthritis   . Complication of anesthesia    Per pt, hard to wake up past last colon in 2011!  . Diabetes mellitus   . Esophageal stricture   . GERD (gastroesophageal reflux disease)   . History of colon polyps 922/2011  . Hyperlipidemia   . IBS (irritable bowel syndrome)      Patient Active Problem List   Diagnosis Date Noted  . Positive cardiac stress test 03/06/2018  . DOE (dyspnea on exertion) 02/08/2018  . Benign prostatic hyperplasia with nocturia 02/08/2018  . Weakness 01/20/2017  . Hyponatremia 01/20/2017  . Counseling regarding end of life decision making 07/14/2015  . GERD (gastroesophageal reflux disease) 12/13/2013  . Diffuse Parynchymal Lung Disease 11/08/2013  . CKD (chronic kidney disease) stage 2, GFR 60-89 ml/min 09/06/2013  . Gout of multiple sites 12/03/2009  . CAD (coronary artery disease) 11/02/2009  . Diabetes mellitus type 2, uncontrolled, without complications ([place]) 40/97/3532  . HYPERCHOLESTEROLEMIA 08/26/2009  . ESSENTIAL HYPERTENSION, BENIGN 08/26/2009  . OSTEOARTHRITIS, MULTIPLE JOINTS  08/26/2009  . ESOPHAGEAL STRICTURE 10/14/2008  . IRRITABLE BOWEL SYNDROME 09/02/2008  . PERSONAL HX COLONIC POLYPS 09/02/2008     Past Surgical History:  Procedure Laterality Date  . COLONOSCOPY    . CORONARY ARTERY BYPASS GRAFT  10-2010   3 vessels  . UPPER GASTROINTESTINAL ENDOSCOPY       Prior to Admission medications   Medication Sig Start Date End Date Taking? Authorizing Provider  ACCU-CHEK FASTCLIX LANCETS MISC 1 Device by Other route 3 (three) times daily. Check blood sugars 2 to 3 times daily.  Dx: 250.02 08/30/13   Jinny Sanders, MD  aspirin 81 MG tablet Take 81 mg by mouth daily.    [provider]  BD INSULIN SYRINGE U/F 31G X 5/16" 1 ML MISC USE TO INJECT LANTUS INSULIN 2 TIMES DAILY 05/17/18   Bedsole, Amy E, MD  benzonatate (TESSALON PERLES) 100 MG capsule Take 2 capsules (200 mg total) by mouth 3 (three) times daily. 10/02/18 10/02/19  Laverle Hobby, MD  benzonatate (TESSALON) 200 MG capsule TAKE 1 CAPSULE BY MOUTH TWICE DAILY AS NEEDED FOR COUGH 09/28/18   Bedsole, Amy E, MD  Blood Glucose Monitoring Suppl (ACCU-CHEK NANO SMARTVIEW) W/DEVICE KIT 1 kit by Other route 3 (three) times daily. Use to check blood sugars 2 to 3 times daily.  Dx: 250.02 08/30/13   Jinny Sanders, MD  colchicine 0.6 MG tablet For flare take 2 tab x 1, repeat 1 tabslet in 2 hours if pain continues, then one tablet  daily 01/19/18   Bedsole, Amy E, MD  ezetimibe (ZETIA) 10 MG tablet Take 1 tablet (10 mg total) by mouth daily. 03/06/18   Minna Merritts, MD  fenofibrate 160 MG tablet Take 1 tablet (160 mg total) by mouth daily. 07/21/17   Bedsole, Amy E, MD  furosemide (LASIX) 20 MG tablet Take 1 tablet (20 mg total) by mouth daily. 05/11/18   Bedsole, Amy E, MD  glucose blood (ACCU-CHEK SMARTVIEW) test strip USE TO CHECK BLOOD SUGARS 2 TO 3 TIMES DAILY.  Dx: E11.65 08/14/17   Bedsole, Amy E, MD  insulin glargine (LANTUS) 100 UNIT/ML injection INJECT 55 UNITS SUBCUTANEOUSLY TWICE DAILY  06/04/18   Bedsole, Amy E, MD  meloxicam (MOBIC) 15 MG tablet Take 1 tablet (15 mg total) by mouth daily. Patient taking differently: Take 15 mg by mouth as needed.  07/21/17   Bedsole, Amy E, MD  Multiple Vitamin (MULTIVITAMIN) tablet Take 1 tablet by mouth daily.      [provider]  Omega-3 Fatty Acids (FISH OIL) 1200 MG CAPS Take 1 capsule by mouth daily.    [provider]  omeprazole (PRILOSEC) 20 MG capsule TAKE 1 CAPSULE EVERY DAY 08/01/15   Bedsole, Amy E, MD  Pirfenidone (ESBRIET) 267 MG TABS Take 3 tablets (801 mg total) by mouth 3 (three) times daily with meals. 03/30/18   Laverle Hobby, MD  simvastatin (ZOCOR) 40 MG tablet TAKE 1 TABLET AT BEDTIME 07/18/17   Bedsole, Amy E, MD  tamsulosin (FLOMAX) 0.4 MG CAPS capsule 1 tablet daily, if not improving increase to 2 tabs daily. 01/19/18   Jinny Sanders, MD     Allergies Patient has no known allergies.   Family History  Problem Relation Age of Onset  . Heart failure Mother   . Aneurysm Father   . Alcohol abuse Brother   . Colon cancer Cousin   . Esophageal cancer Neg Hx   . Rectal cancer Neg Hx   . Stomach cancer Neg Hx     Social History Social History   Tobacco Use  . Smoking status: Never Smoker  . Smokeless tobacco: Never Used  Substance Use Topics  . Alcohol use: No  . Drug use: No    Review of Systems  Constitutional:   No fever or chills.  ENT:   No sore throat. No rhinorrhea. Cardiovascular:   No chest pain or syncope. Respiratory:   No dyspnea or cough. Gastrointestinal:   Negative for abdominal pain, positive vomiting..  Musculoskeletal:   Right chest wall pain as above. All other systems reviewed and are negative except as documented above in ROS and HPI.  ____________________________________________   PHYSICAL EXAM:  VITAL SIGNS: ED Triage Vitals  Enc Vitals Group     BP 10/14/18 0638 (!) 114/56     Pulse Rate 10/14/18 0638 60     Resp 10/14/18 0638 17     Temp  10/14/18 0638 98 F (36.7 C)     Temp Source 10/14/18 0638 Oral     SpO2 10/14/18 0638 100 %     Weight 10/14/18 0639 186 lb (84.4 kg)     Height 10/14/18 0639 _0  (1.676 m)     Head Circumference --      Peak Flow --      Pain Score 10/14/18 0639 0     Pain Loc --      Pain Edu? --      Excl. in [place]? --  Vital signs reviewed, nursing assessments reviewed.   Constitutional:   Alert and oriented. Non-toxic appearance. Eyes:   Conjunctivae are normal. EOMI. PERRL. ENT      Head:   Normocephalic and atraumatic.      Nose:   No congestion/rhinnorhea.       Mouth/Throat:   Dry mucous membranes, no pharyngeal erythema. No peritonsillar mass.       Neck:   No meningismus. Full ROM. Hematological/Lymphatic/Immunilogical:   No cervical lymphadenopathy. Cardiovascular:   RRR. Symmetric bilateral radial and DP pulses.  No murmurs. Cap refill less than 2 seconds. Respiratory:   Normal respiratory effort without tachypnea/retractions. Breath sounds are clear and equal bilaterally. No wheezes/rales/rhonchi. Gastrointestinal:   Soft and nontender. Non distended. There is no CVA tenderness.  No rebound, rigidity, or guarding.  No pulsatile mass Musculoskeletal:   Normal range of motion in all extremities. No joint effusions.  No lower extremity tenderness.  No edema.  No midline spinal tenderness.  Chest wall stable.  Pelvis stable. Neurologic:   Normal speech and language.  Motor grossly intact. No acute focal neurologic deficits are appreciated.  Skin:    Skin is warm, dry and intact. No rash noted.  No petechiae, purpura, or bullae.  ____________________________________________    LABS (pertinent positives/negatives) (all labs ordered are listed, but only abnormal results are displayed) Labs Reviewed  COMPREHENSIVE METABOLIC PANEL  LIPASE, BLOOD  CBC WITH DIFFERENTIAL/PLATELET  INFLUENZA PANEL BY PCR (TYPE A & B)    ____________________________________________   EKG Interpreted by me Sinus rhythm rate of 60, normal axis intervals QRS ST segments and T waves.  No acute ischemic changes.   ____________________________________________    RADIOLOGY  No results found.  ____________________________________________   PROCEDURES Procedures  ____________________________________________    CLINICAL IMPRESSION / ASSESSMENT AND PLAN / ED COURSE  Pertinent labs & imaging results that were available during my care of the patient were reviewed by me and considered in my medical decision making (see chart for details).    Patient presents with vomiting and malaise.  Denies abdominal pain.  Exam is unremarkable except for evidence of dehydration.  I will give IV fluids and check labs and abdominal series x-ray.  No evidence of rib fracture in the thorax pneumonia.  Doubt bowel obstruction perforation biliary disease AAA dissection ACS or mesenteric ischemia.  Abdomen is nonsurgical.  Most likely viral syndrome.      ____________________________________________   FINAL CLINICAL IMPRESSION(S) / ED DIAGNOSES    Final diagnoses:  Malaise  Non-intractable vomiting with nausea, unspecified vomiting type     ED Discharge Orders    None      Portions of this note were generated with dragon dictation software. Dictation errors may occur despite best attempts at proofreading.    Carrie Mew, MD 10/14/18 [phone]

## 2018-10-14 NOTE — ED Notes (Signed)
Pt given two more warm blankets

## 2018-10-14 NOTE — ED Notes (Signed)
Pt vomited medium amount that appear orange per XR tech

## 2018-10-14 NOTE — ED Notes (Signed)
Warm blankets on and EDP aware of rectal temp

## 2018-10-14 NOTE — ED Notes (Signed)
Patient transported to X-ray 

## 2018-10-14 NOTE — Discharge Instructions (Addendum)
Please use your incentive spirometer 2-3 times per hour while awake for the next 7 days to help prevent pneumonia.  Please take your pain medication as needed, as written.  Please take your antibiotics for their entire course.  Return to the emergency department for any fever, increased pain, or any other symptom personally concerning to yourself. Please follow-up with your doctor in the next 2 to 3 days for recheck/reevaluation.  As we discussed your CT did show a mildly enlarged abdominal aortic aneurysm and we recommend getting repeat imaging performed in approximately 3 years to reevaluate.

## 2018-10-14 NOTE — ED Notes (Signed)
Pt unable to urinate. Attempted in and out cath without success r/t pts enlarged prostate.  Helped pt to stand but still unable to obtain urine. Dr Anthonette Legato aware.

## 2018-10-15 ENCOUNTER — Telehealth: Payer: Self-pay

## 2018-10-15 LAB — URINE CULTURE: Culture: NO GROWTH

## 2018-10-15 NOTE — Telephone Encounter (Signed)
Noted.

## 2018-10-15 NOTE — Telephone Encounter (Signed)
Patient went to ER on 10/14/18. Called patient to follow up and make his appointment but patient declined to schedule at this time. Patient states he will just follow up as scheduled in December. I advised patient the reason for follow up and also to let us know if he is not improving and need [name] before his scheduled appointment.

## 2018-11-16 ENCOUNTER — Other Ambulatory Visit: Payer: Medicare HMO

## 2018-11-16 ENCOUNTER — Telehealth: Payer: Self-pay | Admitting: Family Medicine

## 2018-11-16 DIAGNOSIS — IMO0001 Reserved for inherently not codable concepts without codable children: Secondary | ICD-10-CM

## 2018-11-16 DIAGNOSIS — E1165 Type 2 diabetes mellitus with hyperglycemia: Principal | ICD-10-CM

## 2018-11-16 NOTE — Telephone Encounter (Signed)
-----  Message from Lendon Collar, RT sent at 11/06/2018 11:02 AM EST ----- Regarding: Lab orders for Friday Dec 13th Please enter lab orders for 73monthfollow up. Thanks!

## 2018-11-23 ENCOUNTER — Ambulatory Visit (INDEPENDENT_AMBULATORY_CARE_PROVIDER_SITE_OTHER): Payer: Medicare HMO | Admitting: Family Medicine

## 2018-11-23 ENCOUNTER — Encounter: Payer: Self-pay | Admitting: Family Medicine

## 2018-11-23 VITALS — BP 110/64 | HR 85 | Ht 66.0 in | Wt 165.0 lb

## 2018-11-23 DIAGNOSIS — E1140 Type 2 diabetes mellitus with diabetic neuropathy, unspecified: Secondary | ICD-10-CM | POA: Diagnosis not present

## 2018-11-23 DIAGNOSIS — I10 Essential (primary) hypertension: Secondary | ICD-10-CM | POA: Diagnosis not present

## 2018-11-23 DIAGNOSIS — E1165 Type 2 diabetes mellitus with hyperglycemia: Secondary | ICD-10-CM | POA: Diagnosis not present

## 2018-11-23 DIAGNOSIS — Z794 Long term (current) use of insulin: Secondary | ICD-10-CM | POA: Diagnosis not present

## 2018-11-23 DIAGNOSIS — J849 Interstitial pulmonary disease, unspecified: Secondary | ICD-10-CM

## 2018-11-23 DIAGNOSIS — IMO0001 Reserved for inherently not codable concepts without codable children: Secondary | ICD-10-CM

## 2018-11-23 LAB — POCT GLYCOSYLATED HEMOGLOBIN (HGB A1C): Hemoglobin A1C: 7.6 % — AB (ref 4.0–5.6)

## 2018-11-23 LAB — HM DIABETES FOOT EXAM

## 2018-11-23 MED ORDER — ONDANSETRON 4 MG PO TBDP: 4.0000 mg | ORAL_TABLET | Freq: Three times a day (TID) | ORAL | 0 refills | Status: DC | PRN

## 2018-11-23 NOTE — Progress Notes (Signed)
Subjective:    Patient ID: Brian Bass, male    DOB: [birth year], 77 y.o.   MRN: [id number]  HPI   77 year old male  With IPF, CAD, DM presents for  3 month follow up   Referred to Dr. Gabriel Carina for DM poor control.. last appt 06/2018.. he does not awant to go back . Up to date with eye exam and no ulcers. No  Hypoglycemia... did have low CBG with recent hospitalization. Increased metformin to 1000 mg twice daily.  Lantus 55 Unitis twice daily Lab Results  Component Value Date   HGBA1C 7.6 (A) 11/23/2018   Seen in ER on 11/10 for malaise, s/p fall. Work up negative except UTI treated with keflexs and pt felt better after IV hydration and antiemetic.  CT scan of the chest abdomen pelvis is essentially negative besides an incidental enlarged aortic aneurysm which I discussed with the family as well as follow-up in approximately 3 years for repeat imaging.  HTN well controlled on current regimen.   IPF followed by Dr. Ashby Dawes and most recent eval reviewed 10/02/2018  He is doing better on Esbiet with his breathing and cough.  2 tabs three times daily.  Has some nausea with this.     Wt Readings from Last 3 Encounters:  11/23/18 165 lb (74.8 kg)  10/14/18 186 lb (84.4 kg)  08/24/18 164 lb 8 oz (74.6 kg)    Social History /Family History/Past Medical History reviewed in detail and updated in EMR if needed. Blood pressure 110/64, pulse 85, height _0  (1.676 m), weight 165 lb (74.8 kg), SpO2 97 %.  Review of Systems  Constitutional: Positive for fatigue. Negative for fever.  HENT: Negative for ear pain.   Eyes: Negative for pain.  Respiratory: Positive for shortness of breath. Negative for cough.   Cardiovascular: Negative for chest pain, palpitations and leg swelling.  Gastrointestinal: Negative for abdominal pain.  Genitourinary: Negative for dysuria.  Musculoskeletal: Negative for arthralgias.  Neurological: Negative for syncope, light-headedness and headaches.    Psychiatric/Behavioral: Negative for dysphoric mood.       Objective:   Physical Exam Constitutional:      Appearance: He is well-developed.  HENT:     Head: Normocephalic.     Right Ear: Hearing normal.     Left Ear: Hearing normal.     Nose: Nose normal.  Neck:     Thyroid: No thyroid mass or thyromegaly.     Vascular: No carotid bruit.     Trachea: Trachea normal.  Cardiovascular:     Rate and Rhythm: Normal rate and regular rhythm.     Pulses: Normal pulses.     Heart sounds: Heart sounds not distant. No murmur. No friction rub. No gallop.      Comments: No peripheral edema Pulmonary:     Effort: Pulmonary effort is normal. No respiratory distress.     Breath sounds: Examination of the right-lower field reveals rales. Examination of the left-lower field reveals rales. Rales present.  Musculoskeletal:     Lumbar back: Normal. He exhibits normal range of motion, no tenderness and no bony tenderness.  Skin:    General: Skin is warm and dry.     Findings: No rash.  Neurological:     Mental Status: He is alert.     Sensory: No sensory deficit.     Gait: Gait normal.  Psychiatric:        Speech: Speech normal.  Behavior: Behavior normal.        Thought Content: Thought content normal.      Diabetic foot exam: Normal inspection No skin breakdown No calluses  Normal DP pulses Normal sensation to light touch and monofilament Nails normal      Assessment & Plan:

## 2018-11-23 NOTE — Patient Instructions (Addendum)
Work on low [employer].  Stop at lab for urine microalbumin yearly test.

## 2018-11-26 LAB — MICROALBUMIN / CREATININE URINE RATIO
Creatinine,U: 39.3 mg/dL
MICROALB/CREAT RATIO: 1.8 mg/g (ref 0.0–30.0)

## 2018-12-25 ENCOUNTER — Other Ambulatory Visit: Payer: Self-pay | Admitting: Family Medicine

## 2019-01-11 NOTE — Assessment & Plan Note (Signed)
Some improvement on Esbriet. Followed by Pulmonary.

## 2019-01-11 NOTE — Assessment & Plan Note (Signed)
Some improvement on current regimen.  Does not wish to return to see Dr. Gabriel Carina ENDO. Work on low [employer].  Stop at lab for urine microalbumin yearly test. Re-eval in 3 months.

## 2019-01-11 NOTE — Assessment & Plan Note (Signed)
Well controlled. Continue current medication.  

## 2019-01-14 ENCOUNTER — Telehealth: Payer: Self-pay | Admitting: Internal Medicine

## 2019-01-14 NOTE — Telephone Encounter (Signed)
Received medical record request from [facility] from 2015 to present.  Request had been forward to medical records via fax. Nothing further is needed.

## 2019-01-15 ENCOUNTER — Encounter: Payer: Self-pay | Admitting: Internal Medicine

## 2019-01-22 ENCOUNTER — Other Ambulatory Visit: Payer: Self-pay

## 2019-01-22 ENCOUNTER — Ambulatory Visit (AMBULATORY_SURGERY_CENTER): Payer: Self-pay

## 2019-01-22 ENCOUNTER — Telehealth: Payer: Self-pay

## 2019-01-22 ENCOUNTER — Encounter: Payer: Self-pay | Admitting: Internal Medicine

## 2019-01-22 VITALS — Ht 66.0 in | Wt 171.2 lb

## 2019-01-22 DIAGNOSIS — Z86010 Personal history of colonic polyps: Secondary | ICD-10-CM

## 2019-01-22 MED ORDER — NA SULFATE-K SULFATE-MG SULF 17.5-3.13-1.6 GM/177ML PO SOLN: 1.0000 | Freq: Once | ORAL | 0 refills | Status: AC

## 2019-01-22 NOTE — Telephone Encounter (Signed)
Okay to add upper endoscopy with possible dilation with history of Schatzki's ring Upper endoscopy and colonoscopy can be performed on the same day without need for office visit

## 2019-01-22 NOTE — Progress Notes (Signed)
No egg or soy allergy known to patient  No issues with past sedation with any surgeries  or procedures, no intubation problems  No diet pills per patient No home 02 use per patient  No blood thinners per patient  Pt denies issues with constipation  No A fib or A flutter  EMMI video sent to pt's e mail , pt declined

## 2019-01-22 NOTE — Telephone Encounter (Signed)
Pt has egd/colon scheduled for 02-05-19 at 10:00 notified pt wife.

## 2019-01-22 NOTE — Telephone Encounter (Signed)
Pt  Had a previsit this morning. Schedule for colon on 02-05-19.Pt would like to have a EGD same day as Colonoscopy. Pt verbalize having problems swallowing and has had his esophagus dilated before. Since pt procedure here he has been diagnosed with Pulmonary Fibrosis within the last year and he has a enlarged aortic aneurysm which was seen on ER visit on 10-14-18. Please advised if he can have the egd at same time of colon? if he needs a office visit with recent new diagnosis?  previsit nurse Sharyn Lull

## 2019-01-23 ENCOUNTER — Other Ambulatory Visit: Payer: Self-pay | Admitting: Family Medicine

## 2019-01-23 ENCOUNTER — Ambulatory Visit (INDEPENDENT_AMBULATORY_CARE_PROVIDER_SITE_OTHER): Payer: Medicare HMO

## 2019-01-23 VITALS — BP 140/90 | HR 80 | Temp 97.7°F | Ht 66.5 in | Wt 168.9 lb

## 2019-01-23 DIAGNOSIS — M1009 Idiopathic gout, multiple sites: Secondary | ICD-10-CM

## 2019-01-23 DIAGNOSIS — E1165 Type 2 diabetes mellitus with hyperglycemia: Secondary | ICD-10-CM | POA: Diagnosis not present

## 2019-01-23 DIAGNOSIS — E7800 Pure hypercholesterolemia, unspecified: Secondary | ICD-10-CM | POA: Diagnosis not present

## 2019-01-23 DIAGNOSIS — IMO0001 Reserved for inherently not codable concepts without codable children: Secondary | ICD-10-CM

## 2019-01-23 DIAGNOSIS — Z0000 Encounter for general adult medical examination without abnormal findings: Secondary | ICD-10-CM | POA: Diagnosis not present

## 2019-01-23 LAB — COMPREHENSIVE METABOLIC PANEL
ALBUMIN: 4.4 g/dL (ref 3.5–5.2)
ALT: 16 U/L (ref 0–53)
AST: 17 U/L (ref 0–37)
Alkaline Phosphatase: 60 U/L (ref 39–117)
BUN: 15 mg/dL (ref 6–23)
CO2: 27 mEq/L (ref 19–32)
Calcium: 9.9 mg/dL (ref 8.4–10.5)
Chloride: 101 mEq/L (ref 96–112)
Creatinine, Ser: 1.13 mg/dL (ref 0.40–1.50)
GFR: 62.67 mL/min (ref >60.00)
Glucose, Bld: 88 mg/dL (ref 70–99)
POTASSIUM: 4.5 meq/L (ref 3.5–5.1)
SODIUM: 135 meq/L (ref 135–145)
Total Bilirubin: 0.7 mg/dL (ref 0.2–1.2)
Total Protein: 7.4 g/dL (ref 6.0–8.3)

## 2019-01-23 LAB — URIC ACID: Uric Acid, Serum: 5.3 mg/dL (ref 4.0–7.8)

## 2019-01-23 LAB — LIPID PANEL
CHOL/HDL RATIO: 3
Cholesterol: 95 mg/dL (ref 0–200)
HDL: 35.2 mg/dL — ABNORMAL LOW (ref >39.00)
LDL CALC: 39 mg/dL (ref 0–99)
NonHDL: 59.9
Triglycerides: 106 mg/dL (ref 0.0–149.0)
VLDL: 21.2 mg/dL (ref 0.0–40.0)

## 2019-01-23 LAB — HEMOGLOBIN A1C: Hgb A1c MFr Bld: 7.9 % — ABNORMAL HIGH (ref 4.6–6.5)

## 2019-01-23 NOTE — Progress Notes (Signed)
PCP notes:   Health maintenance:  A1C - completed  Abnormal screenings:   Fall risk - hx of single fall Fall Risk  01/23/2019 01/12/2018 07/14/2016 07/14/2015 06/20/2014  Falls in the past year? 1 Yes No No No  Comment fall due to bieng hypoglycemic; injury to right side of ribs; ER visit feet were tangled in cloth causing fall; fractured ribs - - -  Number falls in past yr: 0 1 - - -  Injury with Fall? 1 Yes - - -   Patient concerns:   Intermittent pain in 3rd toe on right foot. Using heating pad and taking OTC pain medication to relieve pain.   Nurse concerns:  None  Next PCP appt:   01/29/19 @ 1000

## 2019-01-23 NOTE — Progress Notes (Signed)
Subjective:   Brian Bass is a 78 y.o. male who presents for Medicare Annual/Subsequent preventive examination.  Review of Systems:  N/A Cardiac Risk Factors include: advanced age (>47mn, >>89women);male gender;diabetes mellitus;dyslipidemia;hypertension     Objective:    Vitals: BP 140/90 (BP Location: Right Arm, Patient Position: Sitting, Cuff Size: Normal)   Pulse 80   Temp 97.7 F (36.5 C) (Oral)   Ht 5' 6.5" (1.689 m) Comment: no shoes  Wt 168 lb 14.4 oz (76.6 kg)   SpO2 96%   BMI 26.85 kg/m   Body mass index is 26.85 kg/m.  Advanced Directives 01/23/2019 10/14/2018 01/12/2018 07/14/2016 01/11/2016  Does Patient Have a Medical Advance Directive? No No Yes Yes No  Type of Advance Directive - - - Living will -  Does patient want to make changes to medical advance directive? - - Yes (MAU/Ambulatory/Procedural Areas - Information given) No - Patient declined -  Copy of [place] Chart? - - - No - copy requested -  Would patient like information on creating a medical advance directive? No - Patient declined - - - No - patient declined information    Tobacco Social History   Tobacco Use  Smoking Status Never Smoker  Smokeless Tobacco Never Used     Counseling given: No   Clinical Intake:  Pre-visit preparation completed: Yes  Pain Score: 2 (toe pain - right foot)     Nutritional Status: BMI 25 -29 Overweight Nutritional Risks: None Diabetes: Yes CBG done?: No Did pt. bring in CBG monitor from home?: No  How often do you need to have someone help you when you read instructions, pamphlets, or other written materials from your doctor or pharmacy?: 1 - Never What is the last grade level you completed in school?: 12th grade + 1.5 yrs college  Interpreter Needed?: No  Comments: pt lives with spouse Information entered by :: LPinson, LPN  Past Medical History:  Diagnosis Date  . Adenomatous colon polyp   . Allergy   . Arthritis   .  Cataract   . Complication of anesthesia    Per pt, hard to wake up past last colon in 2011!  . Diabetes mellitus   . Esophageal stricture   . GERD (gastroesophageal reflux disease)   . History of colon polyps 922/2011  . Hyperlipidemia   . IBS (irritable bowel syndrome)   . Pulmonary fibrosis ([place]    Past Surgical History:  Procedure Laterality Date  . COLONOSCOPY    . CORONARY ARTERY BYPASS GRAFT  10-2010   3 vessels  . UPPER GASTROINTESTINAL ENDOSCOPY     Family History  Problem Relation Age of Onset  . Heart failure Mother   . Aneurysm Father   . Alcohol abuse Brother   . Colon cancer Cousin   . Esophageal cancer Neg Hx   . Rectal cancer Neg Hx   . Stomach cancer Neg Hx    Social History   Socioeconomic History  . Marital status: Married    Spouse name: Not on file  . Number of children: Not on file  . Years of education: Not on file  . Highest education level: Not on file  Occupational History  . 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 Financial resource strain: Not on file  . Food insecurity:    Worry: Not on file    Inability: Not on file  . Transportation needs:  Medical: Not on file    Non-medical: Not on file  Tobacco Use  . Smoking status: Never Smoker  . Smokeless tobacco: Never Used  Substance and Sexual Activity  . Alcohol use: No  . Drug use: No  . Sexual activity: Never  Lifestyle  . Physical activity:    Days per week: Not on file    Minutes per session: Not on file  . Stress: Not on file  Relationships  . Social connections:    Talks on phone: Not on file    Gets together: Not on file    Attends religious service: Not on file    Active member of club or organization: Not on file    Attends meetings of clubs or organizations: Not on file    Relationship status: Not on file  Other Topics Concern  . 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 ACCU-CHEK FASTCLIX LANCETS MISC 1 Device by Other route 3 (three) times daily. Check blood sugars 2 to 3 times daily.  Dx: 250.02  . aspirin 81 MG tablet Take 81 mg by mouth daily.  . BD INSULIN SYRINGE U/F 31G X 5/16" 1 ML MISC USE TO INJECT LANTUS INSULIN 2 TIMES DAILY  . Blood Glucose Monitoring Suppl (ACCU-CHEK NANO SMARTVIEW) W/DEVICE KIT 1 kit by Other route 3 (three) times daily. Use to check blood sugars 2 to 3 times daily.  Dx: 250.02  . colchicine 0.6 MG tablet For flare take 2 tab x 1, repeat 1 tabslet in 2 hours if pain continues, then one tablet daily  . ezetimibe (ZETIA) 10 MG tablet Take 1 tablet (10 mg total) by mouth daily.  . fenofibrate 160 MG tablet Take 1 tablet (160 mg total) by mouth daily.  . furosemide (LASIX) 20 MG tablet Take 1 tablet (20 mg total) by mouth daily.  [name] glucose blood test strip TEST BLOOD SUGAR TWO TO THREE TIMES DAILY  . insulin glargine (LANTUS) 100 UNIT/ML injection INJECT 55 UNITS SUBCUTANEOUSLY TWICE DAILY  . meloxicam (MOBIC) 15 MG tablet Take 1 tablet (15 mg total) by mouth daily. (Patient taking differently: Take 15 mg by mouth as needed. )  . Multiple Vitamin (MULTIVITAMIN) tablet Take 1 tablet by mouth daily.    . Omega-3 Fatty Acids (FISH OIL) 1200 MG CAPS Take 1 capsule by mouth daily.  [name] omeprazole (PRILOSEC) 20 MG capsule TAKE 1 CAPSULE EVERY DAY  . ondansetron (ZOFRAN ODT) 4 MG disintegrating tablet Take 1 tablet (4 mg total) by mouth every 8 (eight) hours as needed for nausea or vomiting.  . Pirfenidone (ESBRIET) 267 MG TABS Take 3 tablets (801 mg total) by mouth 3 (three) times daily with meals.  . simvastatin (ZOCOR) 40 MG tablet TAKE 1 TABLET AT BEDTIME  . tamsulosin (FLOMAX) 0.4 MG CAPS capsule 1 tablet daily, if not improving increase to 2 tabs daily.   No facility-administered encounter  medications on file as of 01/23/2019.     Activities of Daily Living In your present state of health, do you have any difficulty performing the following activities: 01/23/2019  Hearing? N  Vision? N  Difficulty concentrating or making decisions? N  Walking or climbing stairs? N  Dressing or bathing? N  Doing errands, shopping? N  Preparing  Food and eating ? N  Using the Toilet? N  In the past six months, have you accidently leaked urine? N  Do you have problems with loss of bowel control? N  Managing your Medications? N  Managing your Finances? N  Housekeeping or managing your Housekeeping? N  Some recent data might be hidden    Patient Care Team: Jinny Sanders, MD as PCP - General   Assessment:   This is a routine wellness examination for Jerel.   Hearing Screening   125Hz 250Hz 500Hz 1000Hz 2000Hz 3000Hz 4000Hz 6000Hz 8000Hz  Right ear:   40 40 40  40    Left ear:   40 40 40  40    Vision Screening Comments: Vision exam in Nov 2019 @ [facility]   Exercise Activities and Dietary recommendations Current Exercise Habits: Home exercise routine, Type of exercise: walking, Time (Minutes): 30, Frequency (Times/Week): 7, Weekly Exercise (Minutes/Week): 210, Intensity: Mild, Exercise limited by: None identified  Goals    . Exercise 150 min/wk Moderate Activity     Weather permitting, I will continue to walk at least 1 mile daily.        Fall Risk Fall Risk  01/23/2019 01/12/2018 07/14/2016 07/14/2015 06/20/2014  Falls in the past year? 1 Yes No No No  Comment fall due to bieng hypoglycemic; injury to right side of ribs; ER visit feet were tangled in cloth causing fall; fractured ribs - - -  Number falls in past yr: 0 1 - - -  Injury with Fall? 1 Yes - - -   Depression Screen PHQ 2/9 Scores 01/23/2019 01/12/2018 07/14/2016 07/14/2015  PHQ - 2 Score 0 0 0 0  PHQ- 9 Score 0 0 - -    Cognitive Function MMSE - Mini Mental State Exam 01/23/2019 01/12/2018 07/14/2016    Orientation to time _0 Orientation to Place _1 Registration _2 Attention/ Calculation 0 0 0  Recall 3 0 3  Recall-comments - unable to recall 3 of 3 words -  Language- name 2 objects 0 0 0  Language- repeat _3 Language- follow 3 step command _4 Language- read & follow direction 0 0 0  Write a sentence 0 0 0  Copy design 0 0 0  Total score _5 PLEASE NOTE: A Mini-Cog screen was completed. Maximum score is 20. A value of 0 denotes this part of Folstein MMSE was not completed or the patient failed this part of the Mini-Cog screening.   Mini-Cog Screening Orientation to Time - Max 5 pts Orientation to Place - Max 5 pts Registration - Max 3 pts Recall - Max 3 pts Language Repeat - Max 1 pts Language Follow 3 Step Command - Max 3 pts     Immunization History  Administered Date(s) Administered  . Influenza Split 10/05/2011, 10/11/2012  . Influenza Whole 11/12/2010  . Influenza,inj,Quad PF,6+ Mos 09/06/2013, 09/24/2014, 10/02/2015, 09/13/2016, 09/29/2017, 08/24/2018  . Pneumococcal Conjugate-13 01/12/2018  . Pneumococcal Polysaccharide-23 12/06/2007  . Td 08/10/2010    Screening Tests Health Maintenance  Topic Date Due  . COLONOSCOPY  02/12/2019 (Originally 01/10/2019)  . HEMOGLOBIN A1C  07/24/2019  . OPHTHALMOLOGY EXAM  10/11/2019  . FOOT EXAM  11/24/2019  . URINE MICROALBUMIN  11/27/2019  . TETANUS/TDAP  08/10/2020  . INFLUENZA VACCINE  Completed  . PNA vac Low Risk Adult  Completed  Plan:   I have personally reviewed, addressed, and noted the following in the patient's chart:  A. Medical and social history B. Use of alcohol, tobacco or illicit drugs  C. Current medications and supplements D. Functional ability and status E.  Nutritional status F.  Physical activity G. Advance directives H. List of other physicians I.  Hospitalizations, surgeries, and ER visits in previous 12 months J.  [place] to include hearing,  vision, cognitive, depression L. Referrals and appointments - none  In addition, I have reviewed and discussed with patient certain preventive protocols, quality metrics, and best practice recommendations. A written personalized care plan for preventive services as well as general preventive health recommendations were provided to patient.  See attached scanned questionnaire for additional information.   Signed,   Lindell Noe, MHA, BS, LPN Health Coach

## 2019-01-23 NOTE — Telephone Encounter (Signed)
Last office visit 11/23/2018 for DM.  Glipizide is not on current medication list.  Next Appt: 01/29/2019 for CPE.

## 2019-01-23 NOTE — Patient Instructions (Signed)
Brian Bass , Thank you for taking time to come for your Medicare Wellness Visit. I appreciate your ongoing commitment to your health goals. Please review the following plan we discussed and let me know if I can assist you in the future.   These are the goals we discussed: Goals    . Exercise 150 min/wk Moderate Activity     Weather permitting, I will continue to walk at least 1 mile daily.        This is a list of the screening recommended for you and due dates:  Health Maintenance  Topic Date Due  . Colon Cancer Screening  02/12/2019*  . Hemoglobin A1C  07/24/2019  . Eye exam for diabetics  10/11/2019  . Complete foot exam   11/24/2019  . Urine Protein Check  11/27/2019  . Tetanus Vaccine  08/10/2020  . Flu Shot  Completed  . Pneumonia vaccines  Completed  *Topic was postponed. The date shown is not the original due date.   Preventive Care for Adults  A healthy lifestyle and preventive care can promote health and wellness. Preventive health guidelines for adults include the following key practices.  . A routine yearly physical is a good way to check with your health care provider about your health and preventive screening. It is a chance to share any concerns and updates on your health and to receive a thorough exam.  . Visit your dentist for a routine exam and preventive care every 6 months. Brush your teeth twice a day and floss once a day. Good oral hygiene prevents tooth decay and gum disease.  . The frequency of eye exams is based on your age, health, family medical history, use  of contact lenses, and other factors. Follow your health care provider's recommendations for frequency of eye exams.  . Eat a healthy diet. Foods like vegetables, fruits, whole grains, low-fat dairy products, and lean protein foods contain the nutrients you need without too many calories. Decrease your intake of foods high in solid fats, added sugars, and salt. Eat the right amount of calories for  you. Get information about a proper diet from your health care provider, if necessary.  . Regular physical exercise is one of the most important things you can do for your health. Most adults should get at least 150 minutes of moderate-intensity exercise (any activity that increases your heart rate and causes you to sweat) each week. In addition, most adults need muscle-strengthening exercises on 2 or more days a week.  Silver Sneakers may be a benefit available to you. To determine eligibility, you may visit the website: www.silversneakers.com or contact program at [phone] Mon-Fri between 8AM-8PM.   . Maintain a healthy weight. The body mass index (BMI) is a screening tool to identify possible weight problems. It provides an estimate of body fat based on height and weight. Your health care provider can find your BMI and can help you achieve or maintain a healthy weight.   For adults 20 years and older: ? A BMI below 18.5 is considered underweight. ? A BMI of 18.5 to 24.9 is normal. ? A BMI of 25 to 29.9 is considered overweight. ? A BMI of 30 and above is considered obese.   . Maintain normal blood lipids and cholesterol levels by exercising and minimizing your intake of saturated fat. Eat a balanced diet with plenty of fruit and vegetables. Blood tests for lipids and cholesterol should begin at age 17 and be repeated every 5 years.  If your lipid or cholesterol levels are high, you are over 50, or you are at high risk for heart disease, you may need your cholesterol levels checked more frequently. Ongoing high lipid and cholesterol levels should be treated with medicines if diet and exercise are not working.  . If you smoke, find out from your health care provider how to quit. If you do not use tobacco, please do not start.  . If you choose to drink alcohol, please do not consume more than 2 drinks per day. One drink is considered to be 12 ounces (355 mL) of beer, 5 ounces (148 mL) of  wine, or 1.5 ounces (44 mL) of liquor.  . If you are 73-47 years old, ask your health care provider if you should take aspirin to prevent strokes.  . Use sunscreen. Apply sunscreen liberally and repeatedly throughout the day. You should seek shade when your shadow is shorter than you. Protect yourself by wearing long sleeves, pants, a wide-brimmed hat, and sunglasses year round, whenever you are outdoors.  . Once a month, do a whole body skin exam, using a mirror to look at the skin on your back. Tell your health care provider of new moles, moles that have irregular borders, moles that are larger than a pencil eraser, or moles that have changed in shape or color.

## 2019-01-23 NOTE — Progress Notes (Signed)
I reviewed health advisor's note, was available for consultation, and agree with documentation and plan.   Signed,  Maud Deed. Sherlie Boyum, MD

## 2019-01-29 ENCOUNTER — Telehealth: Payer: Self-pay | Admitting: Internal Medicine

## 2019-01-29 ENCOUNTER — Encounter: Payer: Self-pay | Admitting: Family Medicine

## 2019-01-29 ENCOUNTER — Ambulatory Visit (INDEPENDENT_AMBULATORY_CARE_PROVIDER_SITE_OTHER): Payer: Medicare HMO | Admitting: Family Medicine

## 2019-01-29 ENCOUNTER — Telehealth: Payer: Self-pay

## 2019-01-29 VITALS — BP 100/60 | HR 85 | Resp 14 | Ht 66.5 in | Wt 170.5 lb

## 2019-01-29 DIAGNOSIS — E1142 Type 2 diabetes mellitus with diabetic polyneuropathy: Secondary | ICD-10-CM | POA: Diagnosis not present

## 2019-01-29 DIAGNOSIS — J849 Interstitial pulmonary disease, unspecified: Secondary | ICD-10-CM | POA: Diagnosis not present

## 2019-01-29 DIAGNOSIS — E1140 Type 2 diabetes mellitus with diabetic neuropathy, unspecified: Secondary | ICD-10-CM

## 2019-01-29 DIAGNOSIS — Z794 Long term (current) use of insulin: Secondary | ICD-10-CM

## 2019-01-29 DIAGNOSIS — I2510 Atherosclerotic heart disease of native coronary artery without angina pectoris: Secondary | ICD-10-CM

## 2019-01-29 DIAGNOSIS — I714 Abdominal aortic aneurysm, without rupture: Secondary | ICD-10-CM | POA: Diagnosis not present

## 2019-01-29 DIAGNOSIS — N401 Enlarged prostate with lower urinary tract symptoms: Secondary | ICD-10-CM

## 2019-01-29 DIAGNOSIS — N182 Chronic kidney disease, stage 2 (mild): Secondary | ICD-10-CM | POA: Diagnosis not present

## 2019-01-29 DIAGNOSIS — R351 Nocturia: Secondary | ICD-10-CM

## 2019-01-29 DIAGNOSIS — E7800 Pure hypercholesterolemia, unspecified: Secondary | ICD-10-CM | POA: Diagnosis not present

## 2019-01-29 DIAGNOSIS — I10 Essential (primary) hypertension: Secondary | ICD-10-CM

## 2019-01-29 DIAGNOSIS — Z0000 Encounter for general adult medical examination without abnormal findings: Secondary | ICD-10-CM | POA: Diagnosis not present

## 2019-01-29 DIAGNOSIS — I7143 Infrarenal abdominal aortic aneurysm, without rupture: Secondary | ICD-10-CM

## 2019-01-29 DIAGNOSIS — N183 Chronic kidney disease, stage 3 (moderate): Secondary | ICD-10-CM

## 2019-01-29 DIAGNOSIS — R972 Elevated prostate specific antigen [PSA]: Secondary | ICD-10-CM | POA: Diagnosis not present

## 2019-01-29 DIAGNOSIS — E1122 Type 2 diabetes mellitus with diabetic chronic kidney disease: Secondary | ICD-10-CM

## 2019-01-29 MED ORDER — INSULIN ASPART 100 UNIT/ML ~~LOC~~ SOLN: 2.0000 [IU] | Freq: Every day | SUBCUTANEOUS | 11 refills | Status: DC | PRN

## 2019-01-29 MED ORDER — INSULIN ASPART 100 UNIT/ML ~~LOC~~ SOLN: SUBCUTANEOUS | 11 refills | Status: DC

## 2019-01-29 NOTE — Assessment & Plan Note (Signed)
No pain,  Just numbness in feet. Encouraged pt to get better control of DM. Encouraged foot care.

## 2019-01-29 NOTE — Progress Notes (Signed)
Subjective:    Patient ID: Brian Bass, male    DOB: 11-02-1941, 78 y.o.   MRN: [id number]   HPI  The patient presents for  complete physical and review of chronic health problems. He/She also has the following acute concerns today:  The patient saw Candis Musa, LPN for medicare wellness. Note reviewed in detail and important notes copied below.  Health maintenance:  A1C - completed  Abnormal screenings:   Fall risk - hx of single fall Fall Risk  01/23/2019 01/12/2018 07/14/2016 07/14/2015 06/20/2014  Falls in the past year? 1 Yes No No No  Comment fall due to bieng hypoglycemic; injury to right side of ribs; ER visit feet were tangled in cloth causing fall; fractured ribs - - -  Number falls in past yr: 0 1 - - -  Injury with Fall? 1 Yes - - -   Patient concerns:   Intermittent pain in 3rd toe on right foot. Using heating pad and taking OTC pain medication to relieve pain.   01/29/19 TODAY IPF on Esbriet followed by pulmonary., Dr. Ashby Dawes  Hypertension:   Good control on current regimen. BP Readings from Last 3 Encounters:  01/29/19 100/60  01/23/19 140/90  11/23/18 110/64  Using medication without problems or lightheadedness:  occ Chest pain with exertion: none Edema: occ Short of breath: stable.. still coughs a lot Average home BPs: Other issues:  Diabetes:   Taking insulin off and on.. noncompliant.  off metformin.  taking glipizide and lantus  Lab Results  Component Value Date   HGBA1C 7.9 (H) 01/23/2019  Using medications without difficulties: Hypoglycemic episodes: Hyperglycemic episodes: Feet problems: no ulcer Blood Sugars averaging: FBS 70-, 2 hours after meals 180-220 eye exam within last year:  Elevated Cholesterol:  LDL at gola < 70 on simvastatin, zetia Lab Results  Component Value Date   CHOL 95 01/23/2019   HDL 35.20 (L) 01/23/2019   LDLCALC 39 01/23/2019   LDLDIRECT 69.0 05/08/2018   TRIG 106.0 01/23/2019   CHOLHDL 3  01/23/2019  Using medications without problems: Muscle aches:  Diet compliance: moderate Exercise:  walking Other complaints: CAD   CKD: good control.  Has had gout flare in feet in last several weeks. Uric acid 5.9  Colchicine helps resolve issue.    Wt Readings from Last 3 Encounters:  01/29/19 170 lb 8 oz (77.3 kg)  01/23/19 168 lb 14.4 oz (76.6 kg)  01/22/19 171 lb 3.2 oz (77.7 kg)      Social History /Family History/Past Medical History reviewed in detail and updated in EMR if needed. Blood pressure 100/60, pulse 85, resp. rate 14, height 5' 6.5" (1.689 m), weight 170 lb 8 oz (77.3 kg), SpO2 97 %.   Review of Systems  Constitutional: Positive for fatigue. Negative for fever.  HENT: Negative for ear pain.   Eyes: Negative for pain.  Respiratory: Positive for shortness of breath. Negative for cough.   Cardiovascular: Negative for chest pain, palpitations and leg swelling.  Gastrointestinal: Negative for abdominal pain.  Genitourinary: Negative for dysuria.  Musculoskeletal: Negative for arthralgias.  Neurological: Negative for syncope, light-headedness and headaches.  Psychiatric/Behavioral: Positive for dysphoric mood.       Associated with not being able to do as much as he used to.       Objective:   Physical Exam Constitutional:      Appearance: He is well-developed.  HENT:     Head: Normocephalic.     Right Ear: Hearing normal.  Left Ear: Hearing normal.     Nose: Nose normal.  Neck:     Thyroid: No thyroid mass or thyromegaly.     Vascular: No carotid bruit.     Trachea: Trachea normal.  Cardiovascular:     Rate and Rhythm: Normal rate and regular rhythm.     Pulses: Normal pulses.     Heart sounds: Heart sounds not distant. No murmur. No friction rub. No gallop.      Comments: No peripheral edema Pulmonary:     Effort: Pulmonary effort is normal. No respiratory distress.     Breath sounds: Examination of the right-lower field reveals rales.  Examination of the left-lower field reveals rales. Rales present.  Musculoskeletal:     Lumbar back: Normal. He exhibits normal range of motion, no tenderness and no bony tenderness.  Skin:    General: Skin is warm and dry.     Findings: No rash.  Neurological:     Mental Status: He is alert.     Sensory: No sensory deficit.     Gait: Gait normal.  Psychiatric:        Speech: Speech normal.        Behavior: Behavior normal.        Thought Content: Thought content normal.    Diabetic foot exam: Normal inspection No skin breakdown No calluses  Normal DP pulses decreased sensation to light touch and monofilament Nails normal        Assessment & Plan:  The patient's preventative maintenance and recommended screening tests for an annual wellness exam were reviewed in full today. Brought up to date unless services declined.  Counselled on the importance of diet, exercise, and its role in overall health and mortality. The patient's FH and SH was reviewed, including their home life, tobacco status, and drug and alcohol status.    Colon: planned 02/05/2019  Dr. Hilarie Fredrickson

## 2019-01-29 NOTE — Patient Instructions (Addendum)
Take  Long acting (Lantus) 80 units at PM.  Have a healthy bedtime snack.  Something to eat in morning like an apple or almonds. During the day if plan high carb meal or if 2 hour after meal sugar > 200... take fasting acting insulin ( ASPART) 2 units as needed.   Decrease moon pie allotment!

## 2019-01-29 NOTE — Telephone Encounter (Signed)
Spoke with patients wife and reviewed correct times to start the prep and what medications to hold. The wife states they had changed his appointment and she wanted to verify the times.   I answered all her questions and reviewed the prep several times. She understood. She will call back if she has further questions. Brian Bass

## 2019-01-29 NOTE — Telephone Encounter (Signed)
Brian Bass, at [facility], notified by telephone that 10 units daily is maximum for now.

## 2019-01-29 NOTE — Assessment & Plan Note (Signed)
Re-eval  In 05/2019

## 2019-01-29 NOTE — Assessment & Plan Note (Signed)
Poor control. Encouraged pt to take lantus regularly at bedtime.  Will add additional fast acting insulin  prn post prandials > 200 or planned heavy carb meal.  Start low dose to avoid hypoglycemia.  May be able to stop glipizide. Encouraged pt to eat breakfast daily and to avoid sweets.

## 2019-01-29 NOTE — Telephone Encounter (Signed)
Brian Bass with [facility] left v/m requesting cb with maximum daily dose pt can use of Novolog insulin.

## 2019-01-29 NOTE — Assessment & Plan Note (Signed)
Stable control on flomax.

## 2019-01-29 NOTE — Telephone Encounter (Signed)
Let her know for now maximum of  10 units daily.. until we know how he responds to the novolog

## 2019-01-29 NOTE — Assessment & Plan Note (Signed)
Stable.

## 2019-01-29 NOTE — Assessment & Plan Note (Signed)
Followed by pulm, on esbriet. Has upcoming follow up.

## 2019-01-29 NOTE — Telephone Encounter (Signed)
Pt is scheduled 3.3.20 EGD/colon.  [name] asked for clarification for prep times.  Please advise.

## 2019-01-29 NOTE — Assessment & Plan Note (Signed)
Well controlled. Continue current medication.  

## 2019-02-05 ENCOUNTER — Ambulatory Visit (AMBULATORY_SURGERY_CENTER): Payer: Medicare HMO | Admitting: Internal Medicine

## 2019-02-05 ENCOUNTER — Encounter: Payer: Self-pay | Admitting: Internal Medicine

## 2019-02-05 VITALS — BP 112/64 | HR 66 | Temp 97.5°F | Resp 16

## 2019-02-05 DIAGNOSIS — Z86010 Personal history of colonic polyps: Secondary | ICD-10-CM

## 2019-02-05 DIAGNOSIS — K222 Esophageal obstruction: Secondary | ICD-10-CM | POA: Diagnosis not present

## 2019-02-05 DIAGNOSIS — E119 Type 2 diabetes mellitus without complications: Secondary | ICD-10-CM | POA: Diagnosis not present

## 2019-02-05 DIAGNOSIS — D122 Benign neoplasm of ascending colon: Secondary | ICD-10-CM | POA: Diagnosis not present

## 2019-02-05 DIAGNOSIS — D120 Benign neoplasm of cecum: Secondary | ICD-10-CM

## 2019-02-05 DIAGNOSIS — D123 Benign neoplasm of transverse colon: Secondary | ICD-10-CM

## 2019-02-05 DIAGNOSIS — I10 Essential (primary) hypertension: Secondary | ICD-10-CM | POA: Diagnosis not present

## 2019-02-05 DIAGNOSIS — D124 Benign neoplasm of descending colon: Secondary | ICD-10-CM | POA: Diagnosis not present

## 2019-02-05 DIAGNOSIS — R1310 Dysphagia, unspecified: Secondary | ICD-10-CM | POA: Diagnosis not present

## 2019-02-05 DIAGNOSIS — E669 Obesity, unspecified: Secondary | ICD-10-CM | POA: Diagnosis not present

## 2019-02-05 DIAGNOSIS — D125 Benign neoplasm of sigmoid colon: Secondary | ICD-10-CM

## 2019-02-05 DIAGNOSIS — R1319 Other dysphagia: Secondary | ICD-10-CM

## 2019-02-05 DIAGNOSIS — K449 Diaphragmatic hernia without obstruction or gangrene: Secondary | ICD-10-CM

## 2019-02-05 MED ORDER — SODIUM CHLORIDE 0.9 % IV SOLN: 500.0000 mL | Freq: Once | INTRAVENOUS | Status: DC

## 2019-02-05 NOTE — Progress Notes (Deleted)
Called to room to assist during endoscopic procedure.  Patient ID and intended procedure confirmed with present staff. Received instructions for my participation in the procedure from the performing physician.

## 2019-02-05 NOTE — Progress Notes (Signed)
Called to room to assist during endoscopic procedure.  Patient ID and intended procedure confirmed with present staff. Received instructions for my participation in the procedure from the performing physician.

## 2019-02-05 NOTE — Progress Notes (Signed)
Report to PACU, RN, vss, BBS= Clear.

## 2019-02-05 NOTE — Progress Notes (Signed)
Pt's states no medical or surgical changes since previsit or office visit.

## 2019-02-05 NOTE — Patient Instructions (Signed)
YOU HAD AN ENDOSCOPIC PROCEDURE TODAY AT [facility]:   Refer to the procedure report that was given to you for any specific questions about what was found during the examination.  If the procedure report does not answer your questions, please call your gastroenterologist to clarify.  If you requested that your care partner not be given the details of your procedure findings, then the procedure report has been included in a sealed envelope for you to review at your convenience later.  YOU SHOULD EXPECT: Some feelings of bloating in the abdomen. Passage of more gas than usual.  Walking can help get rid of the air that was put into your GI tract during the procedure and reduce the bloating. If you had a lower endoscopy (such as a colonoscopy or flexible sigmoidoscopy) you may notice spotting of blood in your stool or on the toilet paper. If you underwent a bowel prep for your procedure, you may not have a normal bowel movement for a few days.  Please Note:  You might notice some irritation and congestion in your nose or some drainage.  This is from the oxygen used during your procedure.  There is no need for concern and it should clear up in a day or so.  SYMPTOMS TO REPORT IMMEDIATELY:   Following lower endoscopy (colonoscopy or flexible sigmoidoscopy):  Excessive amounts of blood in the stool  Significant tenderness or worsening of abdominal pains  Swelling of the abdomen that is new, acute  Fever of 100F or higher   Following upper endoscopy (EGD)  Vomiting of blood or coffee ground material  New chest pain or pain under the shoulder blades  Painful or persistently difficult swallowing  New shortness of breath  Fever of 100F or higher  Black, tarry-looking stools  For urgent or emergent issues, a gastroenterologist can be reached at any hour by calling [phone].   DIET:  We do recommend a post dilation diet.  Drink plenty of fluids but you should avoid alcoholic  beverages for 24 hours.  ACTIVITY:  You should plan to take it easy for the rest of today and you should NOT DRIVE or use heavy machinery until tomorrow (because of the sedation medicines used during the test).    FOLLOW UP: Our staff will call the number listed on your records the next business day following your procedure to check on you and address any questions or concerns that you may have regarding the information given to you following your procedure. If we do not reach you, we will leave a message.  However, if you are feeling well and you are not experiencing any problems, there is no need to return our call.  We will assume that you have returned to your regular daily activities without incident.  If any biopsies were taken you will be contacted by phone or by letter within the next 1-3 weeks.  Please call us at [phone] if you have not heard about the biopsies in 3 weeks.   Await for biopsy results Polyps (handout given) Hemorrhoids (handout given) Diverticulosis (handout given) Post Esophageal Dilation Diet (handout given) Esophagitis and Stricture (handout given) Hiatal Hernia (handout given)  SIGNATURES/CONFIDENTIALITY: You and/or your care partner have signed paperwork which will be entered into your electronic medical record.  These signatures attest to the fact that that the information above on your After Visit Summary has been reviewed and is understood.  Full responsibility of the confidentiality of this discharge information  lies with you and/or your care-partner.

## 2019-02-06 ENCOUNTER — Telehealth: Payer: Self-pay | Admitting: *Deleted

## 2019-02-06 ENCOUNTER — Telehealth: Payer: Self-pay

## 2019-02-06 NOTE — Telephone Encounter (Signed)
  Follow up Call-  Call back number 02/05/2019 05/03/2017  Post procedure Call Back phone  # [phone]  Permission to leave phone message Yes Yes  Some recent data might be hidden     Patient questions:  Do you have a fever, pain , or abdominal swelling? No. Pain Score  0 *  Have you tolerated food without any problems? Yes.    Have you been able to return to your normal activities? Yes.    Do you have any questions about your discharge instructions: Diet   No. Medications  No. Follow up visit  No.  Do you have questions or concerns about your Care? No.  Actions: * If pain score is 4 or above: No action needed, pain <4.

## 2019-02-06 NOTE — Telephone Encounter (Signed)
Left message on answering machine.

## 2019-02-06 NOTE — Op Note (Signed)
Brian Bass: Brian Bass Procedure Date: 02/05/2019 9:54 AM MRN: [id number] Endoscopist: Jerene Bears , MD Age: 78 Referring MD:  Date of Birth: 01/01/1941 Gender: Male Account #: [id number] Procedure:                Upper GI endoscopy Indications:              Dysphagia, Schatzki's ring dilation to 13 mm in 2018 Medicines:                Monitored Anesthesia Care Procedure:                Pre-Anesthesia Assessment:                           - Prior to the procedure, a History and Physical                            was performed, and patient medications and                            allergies were reviewed. The patient's tolerance of                            previous anesthesia was also reviewed. The risks                            and benefits of the procedure and the sedation                            options and risks were discussed with the patient.                            All questions were answered, and informed consent                            was obtained. Prior Anticoagulants: The patient has                            taken no previous anticoagulant or antiplatelet                            agents. ASA Grade Assessment: III - A patient with                            severe systemic disease. After reviewing the risks                            and benefits, the patient was deemed in                            satisfactory condition to undergo the procedure.                           After obtaining informed consent, the endoscope was  passed under direct vision. Throughout the                            procedure, the patient's blood pressure, pulse, and                            oxygen saturations were monitored continuously. The                            Endoscope was introduced through the mouth, and                            advanced to the second part of duodenum. The upper                            GI  endoscopy was accomplished without difficulty.                            The patient tolerated the procedure well. Scope In: Scope Out: Findings:                 A low-grade of narrowing Schatzki ring was found at                            the gastroesophageal junction at 37 cm. A TTS                            dilator was passed through the scope. Dilation with                            a 15-16.5-18 mm balloon dilator was performed to                            16.5 mm. The dilation site was examined and showed                            moderate improvement in luminal narrowing with                            visible heme in multiple location at the ring.                            Estimated blood loss was minimal.                           A 2 cm hiatal hernia was present.                           The entire examined stomach was normal.                           The examined duodenum was normal. Complications:            No immediate complications. Estimated Blood Loss:     Estimated  blood loss was minimal. Impression:               - Low-grade of narrowing Schatzki ring. Dilated to                            16.5 mm.                           - 2 cm hiatal hernia.                           - Normal stomach.                           - Normal examined duodenum.                           - No specimens collected. Recommendation:           - Patient has a contact number available for                            emergencies. The signs and symptoms of potential                            delayed complications were discussed with the                            patient. Return to normal activities tomorrow.                            Written discharge instructions were provided to the                            patient.                           - Soft diet.                           - Continue present medications.                           - Repeat upper endoscopy as needed for  retreatment. Jerene Bears, MD 02/05/2019 10:43:31 AM This report has been signed electronically.

## 2019-02-06 NOTE — Op Note (Signed)
[facility] Patient Name: Brian Bass Procedure Date: 02/05/2019 9:54 AM MRN: [id number] Endoscopist: Jerene Bears , MD Age: 78 Referring MD:  Date of Birth: 1941-03-25 Gender: Male Account #: [id number] Procedure:                Colonoscopy Indications:              Surveillance: Personal history of adenomatous                            polyps on last colonoscopy 3 years ago Medicines:                Monitored Anesthesia Care Procedure:                Pre-Anesthesia Assessment:                           - Prior to the procedure, a History and Physical                            was performed, and patient medications and                            allergies were reviewed. The patient's tolerance of                            previous anesthesia was also reviewed. The risks                            and benefits of the procedure and the sedation                            options and risks were discussed with the patient.                            All questions were answered, and informed consent                            was obtained. Prior Anticoagulants: The patient has                            taken no previous anticoagulant or antiplatelet                            agents. ASA Grade Assessment: III - A patient with                            severe systemic disease. After reviewing the risks                            and benefits, the patient was deemed in                            satisfactory condition to undergo the procedure.  After obtaining informed consent, the colonoscope                            was passed under direct vision. Throughout the                            procedure, the patient's blood pressure, pulse, and                            oxygen saturations were monitored continuously. The                            Colonoscope was introduced through the anus and                            advanced to the cecum,  identified by appendiceal                            orifice and ileocecal valve. The colonoscopy was                            performed without difficulty. The patient tolerated                            the procedure well. The quality of the bowel                            preparation was good. The ileocecal valve,                            appendiceal orifice, and rectum were photographed. Scope In: 10:16:01 AM Scope Out: 10:37:33 AM Scope Withdrawal Time: 0 hours 19 minutes 15 seconds  Total Procedure Duration: 0 hours 21 minutes 32 seconds  Findings:                 The digital rectal exam was normal.                           A 5 mm polyp was found in the cecum. The polyp was                            sessile. The polyp was removed with a cold snare.                            Resection and retrieval were complete.                           Two sessile polyps were found in the ascending                            colon. The polyps were 4 to 6 mm in size. These  polyps were removed with a cold snare. Resection                            and retrieval were complete.                           Four sessile polyps were found in the transverse                            colon. The polyps were 3 to 6 mm in size. These                            polyps were removed with a cold snare. Resection                            and retrieval were complete.                           A 5 mm polyp was found in the descending colon. The                            polyp was sessile. The polyp was removed with a                            cold snare. Resection and retrieval were complete.                           Two sessile polyps were found in the sigmoid colon.                            The polyps were 4 to 5 mm in size. These polyps                            were removed with a cold snare. Resection and                            retrieval were complete.                            Multiple small and large-mouthed diverticula were                            found in the sigmoid colon.                           Internal hemorrhoids were found during                            retroflexion. The hemorrhoids were small. Complications:            No immediate complications. Estimated Blood Loss:     Estimated blood loss was minimal. Impression:               - One 5 mm polyp in the cecum,  removed with a cold                            snare. Resected and retrieved.                           - Two 4 to 6 mm polyps in the ascending colon,                            removed with a cold snare. Resected and retrieved.                           - Four 3 to 6 mm polyps in the transverse colon,                            removed with a cold snare. Resected and retrieved.                           - One 5 mm polyp in the descending colon, removed                            with a cold snare. Resected and retrieved.                           - Two 4 to 5 mm polyps in the sigmoid colon,                            removed with a cold snare. Resected and retrieved.                           - Diverticulosis in the sigmoid colon.                           - Internal hemorrhoids. Recommendation:           - Patient has a contact number available for                            emergencies. The signs and symptoms of potential                            delayed complications were discussed with the                            patient. Return to normal activities tomorrow.                            Written discharge instructions were provided to the                            patient.                           - Resume previous diet.                           -  Continue present medications.                           - Await pathology results.                           - Repeat colonoscopy is recommended for                            surveillance. The colonoscopy date will  be                            determined after pathology results from today's                            exam become available for review. Jerene Bears, MD 02/05/2019 10:46:50 AM This report has been signed electronically.

## 2019-02-08 ENCOUNTER — Encounter: Payer: Self-pay | Admitting: Internal Medicine

## 2019-02-11 NOTE — Progress Notes (Signed)
[facility]     Assessment and Plan:  Interstitial lung disease- suspect idiopathic pulmonary fibrosis. --Patient has been on Esbriet, he is having trouble tolerating the full dose, is currently only taking 2 pills 3 times daily. -Last liver function test normal.  Will recheck in 3 months. - Patient's wife is looking into research possibilities.  They have been referred to [facility] and are in touch with them. --Overnight oximetry on room air showed no evidence of nocturnal hypoxemia.  Cough. -Chronic, improved with antihistamine.  May be related to sinus drainage.  Exertion. -Progressive dyspnea on exertion, likely secondary to above. - Recommended continued activity.    Return in about 3 months (around 05/15/2019).     Date: 02/11/2019  MRN# [id number] Brian Bass [birth year]    Brian Bass is a 78 y.o. old male seen in consultation for chief complaint of:    Chief Complaint  Patient presents with  . Follow-up    breathing okay, about like normal  . Cough    non productive, taking zyrtec has helped  . Shortness of Breath    with exertion     HPI:  Patient is a 78 year old male with IPF.  Last visit patient was on Esbriet but not tolerating the full dose.  They were interested in clinical trials, therefore referred to [facility]. Since his last visit he feels that his breathing is about the same, he continues to have a mild cough, which appears to be better with antihistamine.  He is still on Esbriet but only 2 tabs 3 tabs per day. She is in communication with [facility] about being enrolled in a clinical trial.  They have also been in contact with [facility] about getting involved in a research program for an experimental inhaler.    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 He has never smoked his wife smoked for 15 years.    Per CT chest: There are calcifications of the aortic valve. Echocardiographic correlation for evaluation of potential valvular dysfunction may be warranted if clinically indicated.  **Overnight oximetry 10/08/2018>> performed on room air; time less than or equal to 88% was 0.7 minutes.  Oxygen was not required. **Chemistry 01/23/2019>> AST, ALT normal. **Imaging personally reviewed, chest x-ray 02/08/18; diffuse groundglass and interstitial changes throughout both lungs, right diaphragm is elevated.  Interstitial changes has progressed significantly since previous chest x-ray on 09/06/13.  CT chest 10/21/13, bilateral emphysematous changes, bilateral subpleural reticular changes consistent with pulmonary fibrosis.  **CT high-resolution 02/27/18>> the bibasilar fibrotic changes have advanced significantly in the subpleural areas of traction bronchiectasis changes have worsened.  Right paratracheal lymphadenopathy minimally changed  **PFT tracings 02/27/18 FVC is 63% predicted, FEV1 of 70% predicted, ratio 86% predicted.  There is no significant change with bronchodilator therapy.  Flow volume loop is consistent with restriction. DLCO is 54% predicted, DLCO is reduced at 56%. -Overall this test consistent with moderate restrictive lung disease.  **Serology 02/27/18>> ACE, rheumatoid factor, ANA negative.    Medication:    Current Outpatient Medications:  .  ACCU-CHEK FASTCLIX LANCETS MISC, 1 Device by Other route 3 (three) times daily. Check blood sugars 2 to 3 times daily.  Dx: 250.02, Disp: 102 each, Rfl: 5 .  aspirin 81 MG tablet, Take 81 mg by mouth daily., Disp: , Rfl:  .  BD INSULIN SYRINGE U/F 31G  X 5/16" 1 ML MISC, USE TO INJECT LANTUS INSULIN 2 TIMES DAILY, Disp: 180 each, Rfl: 3 .  Blood Glucose Monitoring Suppl (ACCU-CHEK NANO SMARTVIEW) W/DEVICE KIT, 1 kit by Other route 3 (three) times daily. Use to check blood sugars 2 to 3 times daily.  Dx: 250.02, Disp: 1 kit, Rfl: 0 .  colchicine  0.6 MG tablet, For flare take 2 tab x 1, repeat 1 tabslet in 2 hours if pain continues, then one tablet daily, Disp: 60 tablet, Rfl: 0 .  ezetimibe (ZETIA) 10 MG tablet, Take 1 tablet (10 mg total) by mouth daily., Disp: 90 tablet, Rfl: 3 .  fenofibrate 160 MG tablet, Take 1 tablet (160 mg total) by mouth daily., Disp: 90 tablet, Rfl: 1 .  glipiZIDE (GLUCOTROL) 10 MG tablet, TAKE ONE TABLET BY MOUTH TWICE A DAY BEFORE A MEAL, Disp: 60 tablet, Rfl: 11 .  glucose blood test strip, TEST BLOOD SUGAR TWO TO THREE TIMES DAILY, Disp: 300 each, Rfl: 3 .  insulin aspart (NOVOLOG) 100 UNIT/ML injection, Use PRN if 2 hr before meal is greater than 200 or if having high carb meal, Disp: 3 vial, Rfl: 11 .  insulin glargine (LANTUS) 100 UNIT/ML injection, INJECT 55 UNITS SUBCUTANEOUSLY TWICE DAILY, Disp: 100 mL, Rfl: 3 .  meloxicam (MOBIC) 15 MG tablet, Take 1 tablet (15 mg total) by mouth daily. (Patient taking differently: Take 15 mg by mouth as needed. ), Disp: 90 tablet, Rfl: 1 .  Multiple Vitamin (MULTIVITAMIN) tablet, Take 1 tablet by mouth daily.  , Disp: , Rfl:  .  Omega-3 Fatty Acids (FISH OIL) 1200 MG CAPS, Take 1 capsule by mouth daily., Disp: , Rfl:  .  omeprazole (PRILOSEC) 20 MG capsule, TAKE 1 CAPSULE EVERY DAY, Disp: 90 capsule, Rfl: 3 .  ondansetron (ZOFRAN ODT) 4 MG disintegrating tablet, Take 1 tablet (4 mg total) by mouth every 8 (eight) hours as needed for nausea or vomiting., Disp: 20 tablet, Rfl: 0 .  Pirfenidone (ESBRIET) 267 MG TABS, Take 3 tablets (801 mg total) by mouth 3 (three) times daily with meals., Disp: 270 tablet, Rfl: 11 .  simvastatin (ZOCOR) 40 MG tablet, TAKE 1 TABLET AT BEDTIME, Disp: 90 tablet, Rfl: 2 .  tamsulosin (FLOMAX) 0.4 MG CAPS capsule, 1 tablet daily, if not improving increase to 2 tabs daily., Disp: 60 capsule, Rfl: 3  Current Facility-Administered Medications:  .  0.9 %  sodium chloride infusion, 500 mL, Intravenous, Once, Pyrtle, Lajuan Lines, MD   Allergies:    Patient has no known allergies.  Review of Systems:  Constitutional: Feels well. Cardiovascular: Denies chest pain, exertional chest pain.  Pulmonary: Denies hemoptysis, pleuritic chest pain.   The remainder of systems were reviewed and were found to be negative other than what is documented in the HPI.    Physical Examination:   VS: BP 128/70 (BP Location: Left Arm, Cuff Size: Normal)   Pulse 85   Ht 5' 6.5" (1.689 m)   Wt 170 lb 9.6 oz (77.4 kg)   SpO2 97%   BMI 27.12 kg/m   General Appearance: No distress  Neuro:without focal findings, mental status, speech normal, alert and oriented HEENT: PERRLA, EOM intact Pulmonary: Scattered bilateral basal crackles. CardiovascularNormal S1,S2.  No m/r/g.  Abdomen: Benign, Soft, non-tender, No masses Renal:  No costovertebral tenderness  GU:  No performed at this time. Endoc: No evident thyromegaly, no signs of acromegaly or Cushing features Skin:   warm, no rashes, no ecchymosis  Extremities:  normal, no cyanosis, clubbing.    LABORATORY PANEL:   CBC No results for input(s): WBC, HGB, HCT, PLT in the last 168 hours. ------------------------------------------------------------------------------------------------------------------  Chemistries  No results for input(s): NA, K, CL, CO2, GLUCOSE, BUN, CREATININE, CALCIUM, MG, AST, ALT, ALKPHOS, BILITOT in the last 168 hours.  Invalid input(s): GFRCGP ------------------------------------------------------------------------------------------------------------------  Cardiac Enzymes No results for input(s): TROPONINI in the last 168 hours. ------------------------------------------------------------  RADIOLOGY:  No results found.     Thank  you for the consultation and for allowing [facility] to assist in the care of your patient. Our recommendations are noted above.  Please contact us if we can be of further service.  Marda Stalker, M.D.,  F.C.C.P.  Board Certified in Internal Medicine, Pulmonary Medicine, [place], and Sleep Medicine.  [facility] Office Number: [phone]   02/11/2019

## 2019-02-12 ENCOUNTER — Encounter: Payer: Self-pay | Admitting: Internal Medicine

## 2019-02-12 ENCOUNTER — Ambulatory Visit: Payer: Medicare HMO | Admitting: Internal Medicine

## 2019-02-12 VITALS — BP 128/70 | HR 85 | Ht 66.5 in | Wt 170.6 lb

## 2019-02-12 DIAGNOSIS — J84112 Idiopathic pulmonary fibrosis: Secondary | ICD-10-CM

## 2019-02-12 DIAGNOSIS — J849 Interstitial pulmonary disease, unspecified: Secondary | ICD-10-CM | POA: Diagnosis not present

## 2019-02-12 NOTE — Patient Instructions (Addendum)
Continue on current medications. I would encourage you to get in touch with [facility], which is the research organization at our office in [place]. Continue to be active.

## 2019-02-15 ENCOUNTER — Other Ambulatory Visit: Payer: Self-pay | Admitting: Family Medicine

## 2019-03-19 ENCOUNTER — Telehealth: Payer: Self-pay

## 2019-03-19 NOTE — Telephone Encounter (Signed)
Called patient from Recall list.  Offer patient Telehealth appointment.  Patient stated he did not have a smart phone.  Patient was agreeable to a Telephone visit.      Virtual Visit Pre-Appointment Phone Call  Steps For Call:  1. Confirm consent - "In the setting of the current Covid19 crisis, you are scheduled for a TELEPHONE visit with your provider on 03/19/2019 at 8:40am.  Just as we do with many in-office visits, in order for you to participate in this visit, we must obtain consent.  If you'd like, I can send this to your mychart (if signed up) or email for you to review.  Otherwise, I can obtain your verbal consent now.  All virtual visits are billed to your insurance company just like a normal visit would be.  By agreeing to a virtual visit, we'd like you to understand that the technology does not allow for your provider to perform an examination, and thus may limit your provider's ability to fully assess your condition.  Finally, though the technology is pretty good, we cannot assure that it will always work on either your or our end, and in the setting of a video visit, we may have to convert it to a phone-only visit.  In either situation, we cannot ensure that we have a secure connection.  Are you willing to proceed?" STAFF: Did the patient verbally acknowledge consent to telehealth visit? YES  2. Confirm the BEST phone number to call the day of the visit: YES  3. Give patient instructions for WebEx/MyChart download to smartphone as below or Doximity/Doxy.me if video visit (depending on what platform provider is using)  4. Advise patient to be prepared with any vital sign or heart rhythm information, their current medicines, and a piece of paper and pen handy for any instructions they may receive the day of their visit  5. Inform patient they will receive a phone call 15 minutes prior to their appointment time (may be from unknown caller ID) so they should be prepared to  answer  6. Confirm that appointment type is correct in Epic appointment notes (video vs telephone)     TELEPHONE CALL NOTE  Janine Ores Studley has been deemed a candidate for a follow-up tele-health visit to limit community exposure during the Covid-19 pandemic. I spoke with the patient via phone to ensure availability of phone/video source, confirm preferred email & phone number, and discuss instructions and expectations.  I reminded Mylen L Giancola to be prepared with any vital sign and/or heart rhythm information that could potentially be obtained via home monitoring, at the time of his visit. I reminded Janine Ores Wernli to expect a phone call at the time of his visit if his visit.  Janan Ridge, [place] 03/19/2019 2:57 PM   DOWNLOADING THE WEBEX APP TO SMARTPHONE  - If Apple, ask patient to go to [employer] and type in WebEx in the search bar. [employer], the blue/green circle. If Android, go to [employer] and type in [employer] in the search bar. The app is free but as with any other app downloads, their phone may require them to verify saved payment information or Apple/Android password.  - The patient does NOT have to create an account. - On the day of the visit, the assist will walk the patient through joining the meeting with the meeting number/password.  DOWNLOADING THE MYCHART APP TO SMARTPHONE  - If Apple, go to [employer] and type in  MyChart in the search bar and download the app. If Android, ask patient to go to [employer] and type in [place] in the search bar and download the app. The app is free but as with any other app downloads, their phone may require them to verify saved payment information or Apple/Android password.  - The patient will need to then log into the app with their MyChart username and password, and select [facility] as their healthcare provider to link the account. When it is time for your visit, go to the MyChart app, find  appointments, and click Begin Video Visit. Be sure to Select Allow for your device to access the Microphone and Camera for your visit. You will then be connected, and your provider will be with you shortly.  **If they have any issues connecting, or need assistance please contact [facility] [phone])**  **If using a computer, in order to ensure the best quality for your visit they will need to use either of the following Internet Browsers: Microsoft [place], or Google Chrome**  [place]   I hereby voluntarily request, consent and authorize [facility] and its employed or contracted physicians, physician assistants, nurse practitioners or other licensed health care professionals (the Practitioner), to provide me with telemedicine health care services (the Services") as deemed necessary by the treating Practitioner. I acknowledge and consent to receive the Services by the Practitioner via telemedicine. I understand that the telemedicine visit will involve communicating with the Practitioner through live audiovisual communication technology and the disclosure of certain medical information by electronic transmission. I acknowledge that I have been given the opportunity to request an in-person assessment or other available alternative prior to the telemedicine visit and am voluntarily participating in the telemedicine visit.  I understand that I have the right to withhold or withdraw my consent to the use of telemedicine in the course of my care at any time, without affecting my right to future care or treatment, and that the Practitioner or I may terminate the telemedicine visit at any time. I understand that I have the right to inspect all information obtained and/or recorded in the course of the telemedicine visit and may receive copies of available information for a reasonable fee.  I understand that some of the potential risks of receiving the  Services via telemedicine include:   Delay or interruption in medical evaluation due to technological equipment failure or disruption;  Information transmitted may not be sufficient (e.g. poor resolution of images) to allow for appropriate medical decision making by the Practitioner; and/or   In rare instances, security protocols could fail, causing a breach of personal health information.  Furthermore, I acknowledge that it is my responsibility to provide information about my medical history, conditions and care that is complete and accurate to the best of my ability. I acknowledge that Practitioner's advice, recommendations, and/or decision may be based on factors not within their control, such as incomplete or inaccurate data provided by me or distortions of diagnostic images or specimens that may result from electronic transmissions. I understand that the practice of medicine is not an exact science and that Practitioner makes no warranties or guarantees regarding treatment outcomes. I acknowledge that I will receive a copy of this consent concurrently upon execution via email to the email address I last provided but may also request a printed copy by calling the office of [facility].    I understand that my insurance will be  billed for this visit.   I have read or had this consent read to me.  I understand the contents of this consent, which adequately explains the benefits and risks of the Services being provided via telemedicine.   I have been provided ample opportunity to ask questions regarding this consent and the Services and have had my questions answered to my satisfaction.  I give my informed consent for the services to be provided through the use of telemedicine in my medical care  By participating in this telemedicine visit I agree to the above.

## 2019-03-23 DIAGNOSIS — I25118 Atherosclerotic heart disease of native coronary artery with other forms of angina pectoris: Secondary | ICD-10-CM | POA: Insufficient documentation

## 2019-03-23 DIAGNOSIS — E1165 Type 2 diabetes mellitus with hyperglycemia: Secondary | ICD-10-CM

## 2019-03-23 NOTE — Progress Notes (Signed)
Virtual Visit via Telephone Note   This visit type was conducted due to national recommendations for restrictions regarding the COVID-19 Pandemic (e.g. social distancing) in an effort to limit this patient's exposure and mitigate transmission in our community.  Due to his co-morbid illnesses, this patient is at least at moderate risk for complications without adequate follow up.  This format is felt to be most appropriate for this patient at this time.  The patient did not have access to video technology/had technical difficulties with video requiring transitioning to audio format only (telephone).  All issues noted in this document were discussed and addressed.  No physical exam could be performed with this format.  Please refer to the patient's chart for his  consent to telehealth for [facility].   I connected with  [name] on 03/25/19 by a video enabled telemedicine application and verified that I am speaking with the correct person using two identifiers. I discussed the limitations of evaluation and management by telemedicine. The patient expressed understanding and agreed to proceed.   Evaluation Performed:  Follow-up visit  Date:  03/25/2019   ID:  Brian Bass, DOB 10/31/1941, MRN [id number]  Patient Location:  [address] [place] [address]   Provider location:   [facility], [place] office  PCP:  Jinny Sanders, MD  Cardiologist:  Patsy Baltimore   Chief Complaint:  SOB    History of Present Illness:    SHADD DUNSTAN is a 78 y.o. male who presents via audio/video conferencing for a telehealth visit today.   The patient does not symptoms concerning for COVID-19 infection (fever, chills, cough, or new SHORTNESS OF BREATH).   Patient has a past medical history of HTN,  Diffuse parenchymal lung disease,  dating back to 2014 CAD,  CABG x 3, November, 2011. [facility] poorly controlled DM  presents for f/u of his worsening  dyspnea on exertion  Chronic SOB, Little cough Tires out easy No regular exercise On flat surface does fine, only symptomatic if he walks quickly up hills  He does deny chest pain, leg edema  Walks 1 mile a day with the dog  Sees Dr. Juanell Fairly, pulmonary  Labs reviewed total chol 95 on simvastatin and zetia HBA1C 7.9  Other past medical hx reviewed  Stress test 02/28/2018 Pharmacological myocardial perfusion imaging study with moderate sized region of moderate intensity ischemia in the mid to distal LAD Ischemia noted on attenuation corrected and non-attenuation corrected images Normal wall motion, EF estimated at 40% No EKG changes concerning for ischemia at peak stress or in recovery. High risk scan    CT scan 2014 and last week showing interstitial lung disease getting worse  Fell accidental in 11/2017 and broke ribs on Left side.. No CXR. Pain resolved after 6 weeks.   Prior CV studies:   The following studies were reviewed today:    Past Medical History:  Diagnosis Date  . Adenomatous colon polyp   . Allergy   . Arthritis   . Cataract   . Complication of anesthesia    Per pt, hard to wake up past last colon in 2011!  . Diabetes mellitus   . Esophageal stricture   . GERD (gastroesophageal reflux disease)   . History of colon polyps 922/2011  . Hyperlipidemia   . IBS (irritable bowel syndrome)   . Pulmonary fibrosis ([place])    Past Surgical History:  Procedure Laterality Date  . COLONOSCOPY    .  CORONARY ARTERY BYPASS GRAFT  10-2010   3 vessels  . UPPER GASTROINTESTINAL ENDOSCOPY       No outpatient medications have been marked as taking for the 03/25/19 encounter (Appointment) with Minna Merritts, MD.   Current Facility-Administered Medications for the 03/25/19 encounter (Appointment) with Minna Merritts, MD  Medication  . 0.9 %  sodium chloride infusion     Allergies:   Patient has no known allergies.   Social History   Tobacco Use  .  Smoking status: Never Smoker  . Smokeless tobacco: Never Used  Substance Use Topics  . Alcohol use: No  . Drug use: No     Current Outpatient Medications on File Prior to Visit  Medication Sig Dispense Refill  . ACCU-CHEK FASTCLIX LANCETS MISC 1 Device by Other route 3 (three) times daily. Check blood sugars 2 to 3 times daily.  Dx: 250.02 102 each 5  . aspirin 81 MG tablet Take 81 mg by mouth daily.    . BD INSULIN SYRINGE U/F 31G X 5/16" 1 ML MISC USE TO INJECT LANTUS INSULIN 2 TIMES DAILY 180 each 3  . Blood Glucose Monitoring Suppl (ACCU-CHEK NANO SMARTVIEW) W/DEVICE KIT 1 kit by Other route 3 (three) times daily. Use to check blood sugars 2 to 3 times daily.  Dx: 250.02 1 kit 0  . colchicine 0.6 MG tablet For flare take 2 tab x 1, repeat 1 tabslet in 2 hours if pain continues, then one tablet daily 60 tablet 0  . ezetimibe (ZETIA) 10 MG tablet Take 1 tablet (10 mg total) by mouth daily. 90 tablet 3  . fenofibrate 160 MG tablet Take 1 tablet (160 mg total) by mouth daily. 90 tablet 1  . glipiZIDE (GLUCOTROL) 10 MG tablet TAKE ONE TABLET BY MOUTH TWICE A DAY BEFORE A MEAL 60 tablet 11  . glucose blood test strip TEST BLOOD SUGAR TWO TO THREE TIMES DAILY 300 each 3  . insulin aspart (NOVOLOG) 100 UNIT/ML injection Use PRN if 2 hr before meal is greater than 200 or if having high carb meal 3 vial 11  . insulin glargine (LANTUS) 100 UNIT/ML injection INJECT 55 UNITS SUBCUTANEOUSLY TWICE DAILY 100 mL 3  . meloxicam (MOBIC) 15 MG tablet Take 1 tablet (15 mg total) by mouth daily. (Patient taking differently: Take 15 mg by mouth as needed. ) 90 tablet 1  . Multiple Vitamin (MULTIVITAMIN) tablet Take 1 tablet by mouth daily.      . Omega-3 Fatty Acids (FISH OIL) 1200 MG CAPS Take 1 capsule by mouth daily.    [name] omeprazole (PRILOSEC) 20 MG capsule TAKE 1 CAPSULE EVERY DAY 90 capsule 3  . ondansetron (ZOFRAN ODT) 4 MG disintegrating tablet Take 1 tablet (4 mg total) by mouth every 8 (eight) hours as  needed for nausea or vomiting. 20 tablet 0  . Pirfenidone (ESBRIET) 267 MG TABS Take 3 tablets (801 mg total) by mouth 3 (three) times daily with meals. 270 tablet 11  . simvastatin (ZOCOR) 40 MG tablet TAKE 1 TABLET AT BEDTIME 90 tablet 2  . tamsulosin (FLOMAX) 0.4 MG CAPS capsule TAKE 1 CAPSULE BY MOUTH DAILY. IF NOT IMPROVING, INCREASE TO 2 TABLETS DAILY 60 capsule 5   Current Facility-Administered Medications on File Prior to Visit  Medication Dose Route Frequency Provider Last Rate Last Dose  . 0.9 %  sodium chloride infusion  500 mL Intravenous Once Pyrtle, Lajuan Lines, MD         Family Hx:  The patient's family history includes Alcohol abuse in his brother; Aneurysm in his father; Colon cancer in his cousin; Heart failure in his mother. There is no history of Esophageal cancer, Rectal cancer, or Stomach cancer.  ROS:   Please see the history of present illness.    Review of Systems  Constitutional: Negative.   Respiratory: Positive for shortness of breath.   Cardiovascular: Negative.   Gastrointestinal: Negative.   Musculoskeletal: Negative.   Neurological: Negative.   Psychiatric/Behavioral: Negative.   All other systems reviewed and are negative.    Labs/Other Tests and Data Reviewed:    Recent Labs: 08/24/2018: Magnesium 2.1; TSH 2.53 10/14/2018: Hemoglobin 13.5; Platelets 204 01/23/2019: ALT 16; BUN 15; Creatinine, Ser 1.13; Potassium 4.5; Sodium 135   Recent Lipid Panel Lab Results  Component Value Date/Time   CHOL 95 01/23/2019 09:27 AM   TRIG 106.0 01/23/2019 09:27 AM   HDL 35.20 (L) 01/23/2019 09:27 AM   CHOLHDL 3 01/23/2019 09:27 AM   LDLCALC 39 01/23/2019 09:27 AM   LDLDIRECT 69.0 05/08/2018 09:10 AM    Wt Readings from Last 3 Encounters:  02/12/19 170 lb 9.6 oz (77.4 kg)  01/29/19 170 lb 8 oz (77.3 kg)  01/23/19 168 lb 14.4 oz (76.6 kg)     Exam:    Vital Signs: Vital signs may also be detailed in the HPI There were no vitals taken for this visit.  Wt  Readings from Last 3 Encounters:  02/12/19 170 lb 9.6 oz (77.4 kg)  01/29/19 170 lb 8 oz (77.3 kg)  01/23/19 168 lb 14.4 oz (76.6 kg)   Temp Readings from Last 3 Encounters:  02/05/19 (!) 97.5 F (36.4 C)  01/23/19 97.7 F (36.5 C) (Oral)  10/14/18 97.6 F (36.4 C) (Oral)   BP Readings from Last 3 Encounters:  02/12/19 128/70  02/05/19 112/64  01/29/19 100/60   Pulse Readings from Last 3 Encounters:  02/12/19 85  02/05/19 66  01/29/19 85    Vitals today 125/66 Pulse  79 Oxygen 98%  Well nourished, well developed male in no acute distress. Constitutional:  oriented to person, place, and time. No distress.     ASSESSMENT & PLAN:    Atherosclerosis of native coronary artery with stable angina pectoris, unspecified whether native or transplanted heart ([place]) - Currently with no symptoms of angina. No further workup at this time. Continue current medication regimen. Stable  Diabetes mellitus type 2, uncontrolled, without complications (HCC) -   dietary indiscretion On insulin Wife monitoring foods  Essential hypertension, benign Blood pressure is well controlled on today's visit. No changes made to the medications. Blood pressure stable  HYPERCHOLESTEROLEMIA Cholesterol is at goal on the current lipid regimen. No changes to the medications were made. Better with zetia  Hx of CABG - Plan: EKG 12-Lead Stress test as above with suggestion of ischemia anterior wall though clearly not definitive in the setting of some artifact As above CTA offered, cardiac catheterization offered, he will call us back if he would like either of these done  Diffuse Parynchymal Lung Disease Followed by pulmonary, Dr. Juanell Fairly  Shortness of breath -  interstitial lung disease Scheduled to see pulmonary   COVID-19 Education: The signs and symptoms of COVID-19 were discussed with the patient and how to seek care for testing (follow up with PCP or arrange E-visit).  The importance of  social distancing was discussed today.  Patient Risk:   After full review of this patients clinical status, I feel that they  are at least moderate risk at this time.  Time:   Today, I have spent 25 minutes with the patient with telehealth technology discussing the cardiac and medical problems/diagnoses detailed above   10 min spent reviewing the chart prior to patient visit today  Medication Adjustments/Labs and Tests Ordered: Current medicines are reviewed at length with the patient today.  Concerns regarding medicines are outlined above.   Tests Ordered: No tests ordered   Medication Changes: No changes made   Disposition: Follow-up in 6 months   Signed, Ida Rogue, MD  03/25/2019 9:01 AM    [facility] Office [address] [place] [address], [place], [place] [address]

## 2019-03-25 ENCOUNTER — Other Ambulatory Visit: Payer: Self-pay

## 2019-03-25 ENCOUNTER — Telehealth (INDEPENDENT_AMBULATORY_CARE_PROVIDER_SITE_OTHER): Payer: Medicare HMO | Admitting: Cardiovascular Disease

## 2019-03-25 DIAGNOSIS — I10 Essential (primary) hypertension: Secondary | ICD-10-CM

## 2019-03-25 DIAGNOSIS — R0602 Shortness of breath: Secondary | ICD-10-CM

## 2019-03-25 DIAGNOSIS — E1165 Type 2 diabetes mellitus with hyperglycemia: Secondary | ICD-10-CM | POA: Diagnosis not present

## 2019-03-25 DIAGNOSIS — R0609 Other forms of dyspnea: Secondary | ICD-10-CM | POA: Diagnosis not present

## 2019-03-25 DIAGNOSIS — Z951 Presence of aortocoronary bypass graft: Secondary | ICD-10-CM

## 2019-03-25 DIAGNOSIS — E7800 Pure hypercholesterolemia, unspecified: Secondary | ICD-10-CM | POA: Diagnosis not present

## 2019-03-25 DIAGNOSIS — I25118 Atherosclerotic heart disease of native coronary artery with other forms of angina pectoris: Secondary | ICD-10-CM

## 2019-03-25 DIAGNOSIS — IMO0001 Reserved for inherently not codable concepts without codable children: Secondary | ICD-10-CM

## 2019-03-25 NOTE — Patient Instructions (Addendum)
Medication Instructions:  No changes  If you need a refill on your cardiac medications before your next appointment, please call your pharmacy.    Lab work: No new labs needed   If you have labs (blood work) drawn today and your tests are completely normal, you will receive your results only by: [name] MyChart Message (if you have MyChart) OR . A paper copy in the mail If you have any lab test that is abnormal or we need to change your treatment, we will call you to review the results.   Testing/Procedures: No new testing needed   Follow-Up: At [facility], you and your health needs are our priority.  As part of our continuing mission to provide you with exceptional heart care, we have created designated Provider Care Teams.  These Care Teams include your primary Cardiologist (physician) and Advanced Practice Providers (APPs -  Physician Assistants and Nurse Practitioners) who all work together to provide you with the care you need, when you need it.  . You will need a follow up appointment in 6 months .   Please call our office 2 months in advance to schedule this appointment (you should receive a recall notification through [place], but if you do not see this by early August please call the office to schedule)   . Providers on your designated Care Team:   . Murray Hodgkins, NP . Christell Faith, PA-C . Marrianne Mood, PA-C  Any Other Special Instructions Will Be Listed Below (If Applicable).  For educational health videos Log in to : [email or web address] Or : [email or web address], password : [email or web address]

## 2019-04-10 ENCOUNTER — Other Ambulatory Visit: Payer: Self-pay | Admitting: Family Medicine

## 2019-04-24 ENCOUNTER — Telehealth: Payer: Self-pay | Admitting: Family Medicine

## 2019-04-24 ENCOUNTER — Other Ambulatory Visit: Payer: Self-pay | Admitting: Family Medicine

## 2019-04-24 DIAGNOSIS — E1140 Type 2 diabetes mellitus with diabetic neuropathy, unspecified: Secondary | ICD-10-CM

## 2019-04-24 DIAGNOSIS — R972 Elevated prostate specific antigen [PSA]: Secondary | ICD-10-CM

## 2019-04-24 DIAGNOSIS — Z794 Long term (current) use of insulin: Secondary | ICD-10-CM

## 2019-04-24 NOTE — Telephone Encounter (Signed)
Spoke with spouse she wanted to know if pt needed labs before his 5/28

## 2019-04-24 NOTE — Telephone Encounter (Signed)
Did he come in today for POC a1C... as on lab list?   Do I need to just order future PSA  or A1C too?

## 2019-04-24 NOTE — Telephone Encounter (Signed)
Spoke with [name] and advised that Brian Bass would need an A1c prior to his virtual visit.  Lab appointment scheduled for 05/01/2019 at 11:00 am.  [name] is asking if he can also go ahead and get his PSA checked.   Please put in future order if okay to check at upcoming lab appointment.

## 2019-04-24 NOTE — Telephone Encounter (Signed)
No,  I have him scheduled on 05/01/2019 for A1c which I have already put the order in but then [name] wanted his PSA checked.  So you would just need to add PSA future order if okay to do at 05/01/2019 lab appointment.

## 2019-04-25 NOTE — Telephone Encounter (Signed)
Last office visit 01/29/2019 for CPE.  Not on current medication list.  Ok to refill?

## 2019-04-30 ENCOUNTER — Telehealth: Payer: Self-pay

## 2019-04-30 NOTE — Telephone Encounter (Signed)
Left detailed VM w COVID screen and curbside info

## 2019-05-01 ENCOUNTER — Other Ambulatory Visit (INDEPENDENT_AMBULATORY_CARE_PROVIDER_SITE_OTHER): Payer: Medicare HMO

## 2019-05-01 DIAGNOSIS — E1140 Type 2 diabetes mellitus with diabetic neuropathy, unspecified: Secondary | ICD-10-CM | POA: Diagnosis not present

## 2019-05-01 DIAGNOSIS — Z794 Long term (current) use of insulin: Secondary | ICD-10-CM | POA: Diagnosis not present

## 2019-05-01 LAB — POCT GLYCOSYLATED HEMOGLOBIN (HGB A1C): Hemoglobin A1C: 7.7 % — AB (ref 4.0–5.6)

## 2019-05-02 ENCOUNTER — Encounter: Payer: Self-pay | Admitting: Family Medicine

## 2019-05-02 ENCOUNTER — Ambulatory Visit (INDEPENDENT_AMBULATORY_CARE_PROVIDER_SITE_OTHER): Payer: Medicare HMO | Admitting: Family Medicine

## 2019-05-02 VITALS — BP 122/65 | HR 80 | Ht 66.5 in | Wt 170.0 lb

## 2019-05-02 DIAGNOSIS — I10 Essential (primary) hypertension: Secondary | ICD-10-CM

## 2019-05-02 DIAGNOSIS — Z794 Long term (current) use of insulin: Secondary | ICD-10-CM

## 2019-05-02 DIAGNOSIS — M1009 Idiopathic gout, multiple sites: Secondary | ICD-10-CM | POA: Diagnosis not present

## 2019-05-02 DIAGNOSIS — J849 Interstitial pulmonary disease, unspecified: Secondary | ICD-10-CM

## 2019-05-02 DIAGNOSIS — E1142 Type 2 diabetes mellitus with diabetic polyneuropathy: Secondary | ICD-10-CM

## 2019-05-02 DIAGNOSIS — R972 Elevated prostate specific antigen [PSA]: Secondary | ICD-10-CM

## 2019-05-02 DIAGNOSIS — E1140 Type 2 diabetes mellitus with diabetic neuropathy, unspecified: Secondary | ICD-10-CM

## 2019-05-02 MED ORDER — COLCHICINE 0.6 MG PO TABS: ORAL_TABLET | ORAL | 0 refills | Status: DC

## 2019-05-02 NOTE — Assessment & Plan Note (Signed)
Cough is thse most bothersome issue for pt... request cough med or inhaler other than benzonatate. Ask pt to discuss with pulmonary if albuterol would help at all with this issue.

## 2019-05-02 NOTE — Assessment & Plan Note (Signed)
Stable.

## 2019-05-02 NOTE — Assessment & Plan Note (Signed)
Occ flares.. requested refills.

## 2019-05-02 NOTE — Assessment & Plan Note (Signed)
Slight improvement in control. Encouraged pt to take medications regularly as opposed to PRn.  Will tolerate A1C in mid 7s  Given age, comorbidities and occ low numbers.

## 2019-05-02 NOTE — Progress Notes (Signed)
VIRTUAL VISIT Due to national recommendations of social distancing due to [place] 19, a virtual visit is felt to be most appropriate for this patient at this time.   I connected with the patient on 05/02/19 at  9:00 AM EDT by virtual telehealth platform and verified that I am speaking with the correct person using two identifiers.   I discussed the limitations, risks, security and privacy concerns of performing an evaluation and management service by  virtual telehealth platform and the availability of in person appointments. I also discussed with the patient that there may be a patient responsible charge related to this service. The patient expressed understanding and agreed to proceed.  Patient location: Home Provider Location: [facility] Participants: Brian Bass and Brian Bass   Chief Complaint  Patient presents with  . Diabetes    History of Present Illness: 78 year old male with ILD presents for DM and HTN follow up.  Diabetes:  Slight improvement in A1C from last OV.  He is currently on lantus 55 Units twice daily and using Novolog PRN if 2 hr before meal is greater than 200 or if having high carb meal. He has not sued the novolog at All.   He seems to take meds when he thinks he needs it, not regularly. HArd to adjust when he does it this way, but over years he has not been willing to change. Lab Results  Component Value Date   HGBA1C 7.7 (A) 05/01/2019  Using medications without difficulties: Hypoglycemic episodes:  Hyperglycemic episodes: Yes.. then he takes his med Feet problems: neuropathy due to DM, stable. No uclers Blood Sugars averaging: FBS in 100s when taking med eye exam within last year:  Hypertension:    Good control on current regimen. BP Readings from Last 3 Encounters:  05/02/19 122/65  02/12/19 128/70  02/05/19 112/64  Using medication without problems or lightheadedness: none Chest pain with exertion:none Edema:none Short of  breath:stabel with ILD.. has chronic cough Average home BPs: Other issues:   COVID 19 screen No recent travel or known exposure to [place] The patient denies respiratory symptoms of COVID 19 at this time.  The importance of social distancing was discussed today.   Review of Systems  Constitutional: Negative for chills and fever.  HENT: Negative for congestion and ear pain.   Eyes: Negative for pain and redness.  Respiratory: Positive for cough and shortness of breath.   Cardiovascular: Negative for chest pain, palpitations and leg swelling.  Gastrointestinal: Negative for abdominal pain, blood in stool, constipation, diarrhea, nausea and vomiting.  Genitourinary: Negative for dysuria.  Musculoskeletal: Negative for falls and myalgias.  Skin: Negative for rash.  Neurological: Negative for dizziness.  Psychiatric/Behavioral: Negative for depression. The patient is not nervous/anxious.       Past Medical History:  Diagnosis Date  . Adenomatous colon polyp   . Allergy   . Arthritis   . Cataract   . Complication of anesthesia    Per pt, hard to wake up past last colon in 2011!  . Diabetes mellitus   . Esophageal stricture   . GERD (gastroesophageal reflux disease)   . History of colon polyps 922/2011  . Hyperlipidemia   . IBS (irritable bowel syndrome)   . Pulmonary fibrosis ([place])     reports that he has never smoked. He has never used smokeless tobacco. He reports that he does not drink alcohol or use drugs.   Current Outpatient Medications:  .  ACCU-CHEK FASTCLIX LANCETS  MISC, 1 Device by Other route 3 (three) times daily. Check blood sugars 2 to 3 times daily.  Dx: 250.02, Disp: 102 each, Rfl: 5 .  aspirin 81 MG tablet, Take 81 mg by mouth daily., Disp: , Rfl:  .  BD INSULIN SYRINGE U/F 31G X 5/16" 1 ML MISC, USE TO INJECT LANTUS INSULIN 2 TIMES DAILY, Disp: 180 each, Rfl: 3 .  benzonatate (TESSALON) 100 MG capsule, TAKE 2 CAPSULES 3 TIMES DAILY, Disp: 90 capsule, Rfl: 0 .   Blood Glucose Monitoring Suppl (ACCU-CHEK NANO SMARTVIEW) W/DEVICE KIT, 1 kit by Other route 3 (three) times daily. Use to check blood sugars 2 to 3 times daily.  Dx: 250.02, Disp: 1 kit, Rfl: 0 .  colchicine 0.6 MG tablet, For flare take 2 tab x 1, repeat 1 tabslet in 2 hours if pain continues, then one tablet daily, Disp: 60 tablet, Rfl: 0 .  ezetimibe (ZETIA) 10 MG tablet, TAKE ONE TABLET BY MOUTH EVERY DAY, Disp: 90 tablet, Rfl: 3 .  fenofibrate 160 MG tablet, Take 1 tablet (160 mg total) by mouth daily., Disp: 90 tablet, Rfl: 1 .  glipiZIDE (GLUCOTROL) 10 MG tablet, TAKE ONE TABLET BY MOUTH TWICE A DAY BEFORE A MEAL, Disp: 60 tablet, Rfl: 11 .  glucose blood test strip, TEST BLOOD SUGAR TWO TO THREE TIMES DAILY, Disp: 300 each, Rfl: 3 .  insulin aspart (NOVOLOG) 100 UNIT/ML injection, Use PRN if 2 hr before meal is greater than 200 or if having high carb meal, Disp: 3 vial, Rfl: 11 .  insulin glargine (LANTUS) 100 UNIT/ML injection, INJECT 55 UNITS SUBCUTANEOUSLY TWICE DAILY, Disp: 100 mL, Rfl: 3 .  meloxicam (MOBIC) 15 MG tablet, Take 15 mg by mouth daily as needed for pain., Disp: , Rfl:  .  Multiple Vitamin (MULTIVITAMIN) tablet, Take 1 tablet by mouth daily.  , Disp: , Rfl:  .  Omega-3 Fatty Acids (FISH OIL) 1200 MG CAPS, Take 1 capsule by mouth daily., Disp: , Rfl:  .  omeprazole (PRILOSEC) 20 MG capsule, TAKE 1 CAPSULE EVERY DAY, Disp: 90 capsule, Rfl: 3 .  ondansetron (ZOFRAN ODT) 4 MG disintegrating tablet, Take 1 tablet (4 mg total) by mouth every 8 (eight) hours as needed for nausea or vomiting., Disp: 20 tablet, Rfl: 0 .  Pirfenidone (ESBRIET) 267 MG TABS, Take 3 tablets (801 mg total) by mouth 3 (three) times daily with meals., Disp: 270 tablet, Rfl: 11 .  simvastatin (ZOCOR) 40 MG tablet, TAKE 1 TABLET AT BEDTIME, Disp: 90 tablet, Rfl: 2 .  tamsulosin (FLOMAX) 0.4 MG CAPS capsule, TAKE 1 CAPSULE BY MOUTH DAILY. IF NOT IMPROVING, INCREASE TO 2 TABLETS DAILY, Disp: 60 capsule, Rfl:  5  Current Facility-Administered Medications:  .  0.9 %  sodium chloride infusion, 500 mL, Intravenous, Once, Pyrtle, Lajuan Lines, MD   Observations/Objective: Blood pressure 122/65, pulse 80, height 5' 6.5" (1.689 m), weight 170 lb (77.1 kg), SpO2 98 %.  Physical Exam  Physical Exam Constitutional:      General: The patient is not in acute distress. Pulmonary:     Effort: Pulmonary effort is normal. No respiratory distress.  Neurological:     Mental Status: The patient is alert and oriented to person, place, and time.  Psychiatric:        Mood and Affect: Mood normal.        Behavior: Behavior normal.   Assessment and Plan Type 2 diabetes mellitus with diabetic neuropathy, unspecified (HCC) Slight improvement in control.  Encouraged pt to take medications regularly as opposed to PRn.  Will tolerate A1C in mid 7s  Given age, comorbidities and occ low numbers.  Gout of multiple sites Occ flares.. requested refills.  Diffuse Parynchymal Lung Disease Cough is thse most bothersome issue for pt... request cough med or inhaler other than benzonatate. Ask pt to discuss with pulmonary if albuterol would help at all with this issue.  Diabetic polyneuropathy associated with type 2 diabetes mellitus (HCC) Stable.     I discussed the assessment and treatment plan with the patient. The patient was provided an opportunity to ask questions and all were answered. The patient agreed with the plan and demonstrated an understanding of the instructions.   The patient was advised to call back or seek an in-person evaluation if the symptoms worsen or if the condition fails to improve as anticipated.     Brian Lofts, MD

## 2019-05-03 NOTE — Progress Notes (Signed)
Labs 8/28 Follow up 9/3 Pt aware

## 2019-05-22 ENCOUNTER — Other Ambulatory Visit: Payer: Self-pay | Admitting: Family Medicine

## 2019-05-22 NOTE — Telephone Encounter (Signed)
Last office visit 05/02/2019 for DM.  Last refilled 04/25/2019 for #90 with no refills.  Next Appt: 08/08/2019 for 3 month follow up.

## 2019-05-28 ENCOUNTER — Other Ambulatory Visit: Payer: Self-pay | Admitting: Internal Medicine

## 2019-05-28 MED ORDER — ESBRIET 267 MG PO TABS: 3.0000 | ORAL_TABLET | Freq: Three times a day (TID) | ORAL | 11 refills | Status: DC

## 2019-05-28 NOTE — Telephone Encounter (Signed)
Called and spoke to pt's spouse, [name] [facility]).  [name] is requesting that Rx for Esbriet be sent to [facility]. Rx for Jacklynn Bue has been phoned in to [place] with [facility]. Nothing further is needed.

## 2019-05-30 ENCOUNTER — Other Ambulatory Visit: Payer: Self-pay | Admitting: Family Medicine

## 2019-06-03 NOTE — Progress Notes (Signed)
[name]     Assessment and Plan:  Interstitial lung disease-suspect idiopathic pulmonary fibrosis. --Patient has been on Esbriet, he is having trouble tolerating the full dose, is currently only taking 2 pills 3 times daily. -Last liver function test normal.  Will now - Patient's wife is looking into research possibilities.  They have been referred to [facility] and are in touch with them. --Overnight oximetry on room air showed no evidence of nocturnal hypoxemia. - Interested in starting on an inhaler to see if it helps, will start Spiriva empirically, asked to try it, but if it does not help he should stop it.  Cough. -Chronic, improved with antihistamine.  May be related to sinus drainage.  Dyspnea on exertion. -Progressive dyspnea on exertion, likely secondary to above. - Recommended continued activity.  Orders Placed This Encounter  Procedures  . CBC with Differential/Platelet  . Comp Met (CMET)   Meds ordered this encounter  Medications  . tiotropium (SPIRIVA HANDIHALER) 18 MCG inhalation capsule    Sig: Place 1 capsule (18 mcg total) into inhaler and inhale daily.    Dispense:  30 capsule    Refill:  2     Return in about 3 months (around 09/04/2019).     Date: 06/03/2019  MRN# [id number] Brian Bass [birth year]    Brian DICOSTANZO is a 78 y.o. old male seen in consultation for chief complaint of: dyspnea.     HPI:  Brian Bass is a 78 y.o. male  with IPF.  Last visit patient was on Esbriet but not tolerating the full dose.  They were interested in clinical trials, therefore referred to [facility].  He feels that his breathing has been doing ok, he has occasional cough. He is taking tessalon, 2 tabs three times daily which helps. He remains on esbriet is currently taking 2 tabs tid due to nausea and it makes him tired, also diarrhea.   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 He has never smoked his wife smoked for 15 years.   Per CT chest: There are calcifications of the aortic valve. Echocardiographic correlation for evaluation of potential valvular dysfunction may be warranted if clinically indicated.  **Overnight oximetry 10/08/2018>> performed on room air; time less than or equal to 88% was 0.7 minutes.  Oxygen was not required. **Chemistry 01/23/2019>> AST, ALT normal. **Imaging personally reviewed, chest x-ray 02/08/18; diffuse groundglass and interstitial changes throughout both lungs, right diaphragm is elevated.  Interstitial changes has progressed significantly since previous chest x-ray on 09/06/13.  CT chest 10/21/13, bilateral emphysematous changes, bilateral subpleural reticular changes consistent with pulmonary fibrosis.  **CT high-resolution 02/27/18>> the bibasilar fibrotic changes have advanced significantly in the subpleural areas of traction bronchiectasis changes have worsened.  Right paratracheal lymphadenopathy minimally changed  **PFT tracings 02/27/18 FVC is 63% predicted, FEV1 of 70% predicted, ratio 86% predicted.  There is no significant change with bronchodilator therapy.  Flow volume loop is consistent with restriction. DLCO is 54% predicted, DLCO is reduced at 56%. -Overall this test consistent with moderate restrictive lung disease.  **Serology 02/27/18>> ACE, rheumatoid factor, ANA negative.    Medication:    Current Outpatient Medications:  .  ACCU-CHEK FASTCLIX LANCETS MISC, 1 Device by Other route 3 (three) times daily. Check blood sugars 2 to 3 times daily.  Dx: 250.02, Disp: 102 each, Rfl: 5 .  aspirin 81 MG tablet, Take 81 mg by mouth daily., Disp: , Rfl:  .  BD INSULIN SYRINGE U/F 31G X 5/16" 1 ML MISC, USE TO INJECT LANTUS INSULIN 2 TIMES DAILY, Disp: 180 each, Rfl: 3 .  benzonatate (TESSALON) 100 MG capsule, TAKE 2 CAPSULES BY MOUTH 3 TIMES DAILY, Disp: 180 capsule,  Rfl: 0 .  Blood Glucose Monitoring Suppl (ACCU-CHEK NANO SMARTVIEW) W/DEVICE KIT, 1 kit by Other route 3 (three) times daily. Use to check blood sugars 2 to 3 times daily.  Dx: 250.02, Disp: 1 kit, Rfl: 0 .  COLCRYS 0.6 MG tablet, FOR FLARE TAKE 2 TABLETS ONCE THEN REPEAT 1 TABLET IN 2 HOURS IF PAINCONTINUES, THEN 1 TABLET DAILY, Disp: 60 tablet, Rfl: 0 .  ezetimibe (ZETIA) 10 MG tablet, TAKE ONE TABLET BY MOUTH EVERY DAY, Disp: 90 tablet, Rfl: 3 .  fenofibrate 160 MG tablet, Take 1 tablet (160 mg total) by mouth daily., Disp: 90 tablet, Rfl: 1 .  glipiZIDE (GLUCOTROL) 10 MG tablet, TAKE ONE TABLET BY MOUTH TWICE A DAY BEFORE A MEAL, Disp: 60 tablet, Rfl: 11 .  glucose blood test strip, TEST BLOOD SUGAR TWO TO THREE TIMES DAILY, Disp: 300 each, Rfl: 3 .  insulin aspart (NOVOLOG) 100 UNIT/ML injection, Use PRN if 2 hr before meal is greater than 200 or if having high carb meal, Disp: 3 vial, Rfl: 11 .  insulin glargine (LANTUS) 100 UNIT/ML injection, INJECT 55 UNITS SUBCUTANEOUSLY TWICE DAILY, Disp: 100 mL, Rfl: 3 .  meloxicam (MOBIC) 15 MG tablet, Take 15 mg by mouth daily as needed for pain., Disp: , Rfl:  .  Multiple Vitamin (MULTIVITAMIN) tablet, Take 1 tablet by mouth daily.  , Disp: , Rfl:  .  Omega-3 Fatty Acids (FISH OIL) 1200 MG CAPS, Take 1 capsule by mouth daily., Disp: , Rfl:  .  omeprazole (PRILOSEC) 20 MG capsule, TAKE 1 CAPSULE EVERY DAY, Disp: 90 capsule, Rfl: 3 .  ondansetron (ZOFRAN ODT) 4 MG disintegrating tablet, Take 1 tablet (4 mg total) by mouth every 8 (eight) hours as needed for nausea or vomiting., Disp: 20 tablet, Rfl: 0 .  Pirfenidone (ESBRIET) 267 MG TABS, Take 3 tablets (801 mg total) by mouth 3 (three) times daily with meals., Disp: 270 tablet, Rfl: 11 .  simvastatin (ZOCOR) 40 MG tablet, TAKE 1 TABLET AT BEDTIME, Disp: 90 tablet, Rfl: 2 .  tamsulosin (FLOMAX) 0.4 MG CAPS capsule, TAKE 1 CAPSULE BY MOUTH DAILY. IF NOT IMPROVING, INCREASE TO 2 TABLETS DAILY, Disp: 60  capsule, Rfl: 5  Current Facility-Administered Medications:  .  0.9 %  sodium chloride infusion, 500 mL, Intravenous, Once, Pyrtle, Lajuan Lines, MD   Allergies:  Patient has no known allergies.  Review of Systems:  Constitutional: Feels well. Cardiovascular: Denies chest pain, exertional chest pain.  Pulmonary: Denies hemoptysis, pleuritic chest pain.   The remainder of systems were reviewed and were found to be negative other than what is documented in the HPI.      LABORATORY PANEL:   CBC No results for input(s): WBC, HGB, HCT, PLT in the last 168 hours. ------------------------------------------------------------------------------------------------------------------  Chemistries  No results for input(s): NA, K, CL, CO2, GLUCOSE, BUN, CREATININE, CALCIUM, MG, AST, ALT, ALKPHOS, BILITOT in the last 168 hours.  Invalid input(s): GFRCGP ------------------------------------------------------------------------------------------------------------------  Cardiac Enzymes No results for input(s): TROPONINI in the last 168 hours. ------------------------------------------------------------  RADIOLOGY:  No results found.     Thank  you for the consultation and for allowing [facility]   Pulmonary, Critical Care to assist in the care of your patient. Our recommendations are noted above.  Please contact us if we can be of further service.  Marda Stalker, M.D., F.C.C.P.  Board Certified in Internal Medicine, Pulmonary Medicine, [place], and Sleep Medicine.  [facility] Office Number: [phone]   06/03/2019

## 2019-06-04 ENCOUNTER — Ambulatory Visit (INDEPENDENT_AMBULATORY_CARE_PROVIDER_SITE_OTHER): Payer: Medicare HMO | Admitting: Internal Medicine

## 2019-06-04 DIAGNOSIS — J84112 Idiopathic pulmonary fibrosis: Secondary | ICD-10-CM

## 2019-06-04 DIAGNOSIS — R0602 Shortness of breath: Secondary | ICD-10-CM | POA: Diagnosis not present

## 2019-06-04 DIAGNOSIS — R05 Cough: Secondary | ICD-10-CM

## 2019-06-04 MED ORDER — SPIRIVA HANDIHALER 18 MCG IN CAPS: 18.0000 ug | ORAL_CAPSULE | Freq: Every day | RESPIRATORY_TRACT | 2 refills | Status: DC

## 2019-06-04 NOTE — Patient Instructions (Signed)
I have ordered repeat blood work, you should have this performed at your next blood draw. I have ordered Spiriva inhaler, use this for the next month to see if it helps, if not you can stop it.

## 2019-06-13 NOTE — Progress Notes (Signed)
The DOS is at the top of the chart. The date at the bottom reflects that date the note was started.

## 2019-07-25 ENCOUNTER — Other Ambulatory Visit: Payer: Self-pay

## 2019-07-25 ENCOUNTER — Telehealth: Payer: Self-pay | Admitting: Family Medicine

## 2019-07-25 MED ORDER — INSULIN GLARGINE 100 UNIT/ML ~~LOC~~ SOLN: SUBCUTANEOUS | 3 refills | Status: DC

## 2019-07-25 NOTE — Telephone Encounter (Signed)
Best number [phone]  [name] (spouse) called wanting donna to call regarding changing his dm meds  Offered to make appointment with dr Diona Browner spouse declined wanted to talk to [facility]

## 2019-07-26 NOTE — Telephone Encounter (Signed)
Left message for Brian Bass to return my call.

## 2019-07-29 NOTE — Telephone Encounter (Signed)
Left message for [name] to return my call.

## 2019-07-30 NOTE — Telephone Encounter (Signed)
Left message on home number for [name] to return my call.  I have left 2 message on cell phone with no return call.  At this point, I will await a return call from [place].

## 2019-07-31 NOTE — Telephone Encounter (Addendum)
Spoke with Brian Bass.  She states they are in the donut hole and they can't afford Brian Bass's Lantus insulin. She states it is costing them $350 for 3 vials.  Looks like Brian Bass has cancelled his upcoming appointment on 08/08/2019.  I have left a message for Brian Bass to call me back so we can reschedule his appointment with Brian Bass to follow up on his diabetes.  She states their pharmacist suggested another insulin that starts with an N but Brian Bass can't remember what it is and she is currently in the hospital for gall bladder.  I have left a message at [facility] asking them to call me back with name of insulin they suggested to Brian Bass. Spoke with pharmacist at [facility].  He states Brian Bass can get a vial of Novolin R at [facility] for $25.  Of course he would have to draw it up because it is a vial and will have to adjust his dosing.

## 2019-07-31 NOTE — Telephone Encounter (Signed)
Brian Bass returned your call Best number  [phone]

## 2019-08-01 NOTE — Telephone Encounter (Signed)
Novolin R not taken with same dosing... needs to be taken  Times daily with meals.   First I would have them check to see if balsalgar or levemir covered.. they are long acting.

## 2019-08-01 NOTE — Telephone Encounter (Signed)
They are in the donut hole.  So no matter what insulin is prescribed, they will have to pay out of pocket for.

## 2019-08-02 ENCOUNTER — Other Ambulatory Visit: Payer: Medicare HMO

## 2019-08-02 NOTE — Telephone Encounter (Signed)
[name],  Please try to schedule Mr. Brian Bass an appointment next week with Dr. Diona Browner to follow up on his diabetes.

## 2019-08-02 NOTE — Telephone Encounter (Signed)
Appointment 9/3

## 2019-08-02 NOTE — Telephone Encounter (Signed)
Okay.. if the want to use Novolin R at [facility] for $25... He will need to take this with each meal TID.   To determine the best dose I really need to have him in for an appointment first ( can be virtual to get his fasting numbers and to make a plan)

## 2019-08-06 ENCOUNTER — Other Ambulatory Visit: Payer: Self-pay | Admitting: Family Medicine

## 2019-08-06 ENCOUNTER — Telehealth: Payer: Self-pay | Admitting: Family Medicine

## 2019-08-06 NOTE — Telephone Encounter (Signed)
Lyndel Pleasure (daughter) called to let you know pt has not been taken his insulin [place].  She stated for 3 months of the year insurance does not pay for this.  She stated pt cannot afford this.  She wanted to know if there was any programs to help pt with insulin cost  She also stated pt does not know she is calling about this please do not let pt know.

## 2019-08-06 NOTE — Telephone Encounter (Signed)
Can we see if he is a candidate for [facility] pharmacy help? How do we do this?

## 2019-08-06 NOTE — Telephone Encounter (Signed)
Last office visit 05/02/2019 for DM.  Last refilled 05/23/2019 for #180 with no refills.  Next Appt: 08/08/2019 for DM.

## 2019-08-06 NOTE — Telephone Encounter (Signed)
Brian Bass is schedule to see Dr. Diona Browner on 08/08/2019.  She is aware of insulin issue and will discuss this with him at that appointment.

## 2019-08-06 NOTE — Telephone Encounter (Signed)
Mandy,  Do you know how see if a patient would qualify for assistance through [facility] to help with getting his insulin through patient assistance?

## 2019-08-06 NOTE — Telephone Encounter (Signed)
Katheryn didn't want me to send note after I had already typed this up.   She didn't want to upset pt  Daughter is not on pt DPR

## 2019-08-07 NOTE — Addendum Note (Signed)
Addended by: [name] E on: 08/07/2019 02:39 PM   Modules accepted: Orders

## 2019-08-07 NOTE — Telephone Encounter (Signed)
I spoke with [facility] this am.  He does qualify for this assistance, all we need to do is have Dr. Diona Browner put in the ambulatory referral order for "[facility] care management".    I have a copy (printout) of the workflow and I will give it to her today so she can put in the referral.   Thanks!

## 2019-08-08 ENCOUNTER — Encounter: Payer: Self-pay | Admitting: Family Medicine

## 2019-08-08 ENCOUNTER — Ambulatory Visit: Payer: Medicare HMO | Admitting: Family Medicine

## 2019-08-08 ENCOUNTER — Ambulatory Visit (INDEPENDENT_AMBULATORY_CARE_PROVIDER_SITE_OTHER): Payer: Medicare HMO | Admitting: Family Medicine

## 2019-08-08 ENCOUNTER — Other Ambulatory Visit: Payer: Self-pay

## 2019-08-08 VITALS — BP 120/70 | HR 90 | Temp 98.6°F | Ht 66.5 in | Wt 161.4 lb

## 2019-08-08 DIAGNOSIS — Z23 Encounter for immunization: Secondary | ICD-10-CM | POA: Diagnosis not present

## 2019-08-08 DIAGNOSIS — J849 Interstitial pulmonary disease, unspecified: Secondary | ICD-10-CM

## 2019-08-08 DIAGNOSIS — Z794 Long term (current) use of insulin: Secondary | ICD-10-CM

## 2019-08-08 DIAGNOSIS — E1140 Type 2 diabetes mellitus with diabetic neuropathy, unspecified: Secondary | ICD-10-CM

## 2019-08-08 DIAGNOSIS — M1009 Idiopathic gout, multiple sites: Secondary | ICD-10-CM | POA: Diagnosis not present

## 2019-08-08 LAB — POCT GLYCOSYLATED HEMOGLOBIN (HGB A1C): Hemoglobin A1C: 6.9 % — AB (ref 4.0–5.6)

## 2019-08-08 LAB — HM DIABETES FOOT EXAM

## 2019-08-08 MED ORDER — COLCRYS 0.6 MG PO TABS: ORAL_TABLET | ORAL | 0 refills | Status: DC

## 2019-08-08 NOTE — Assessment & Plan Note (Signed)
Refilled colcrys... he has been eating more pinto beans so this may cause of increase in flares.  Will try to decrease but if still 3-4 fares per year... will consider allopurinol to lower uric acid.

## 2019-08-08 NOTE — Assessment & Plan Note (Signed)
Per Pulm.. on esbriet ( continued cough... refilled benzonatate)

## 2019-08-08 NOTE — Patient Instructions (Addendum)
Take 70 units Lanuts daily. Do not take glucotrol/glipizide without eating.  Check fasting blood sugars and  once daily 2 hours after  Meals. Record and call/email with measurements. If blood sugars less than 60.. call so we can decrease insulin dose if weight continuing to decrease.  MAke sure to eat three meals daily with protein.

## 2019-08-08 NOTE — Assessment & Plan Note (Signed)
Improved control with weight loss ( no appetite as SE to Esbriet).  Encouragted weight maintenance and 3 melas a day with protein. Follow CBGs daily.. stay on reduced dose 70- UNits daily with glucotrol.  Call with measurements... may need to D/C glucotrol of causing lows.  He was encouraged to eat when taking this medication.

## 2019-08-08 NOTE — Progress Notes (Signed)
Chief Complaint  Patient presents with  . Diabetes    History of Present Illness: HPI  78 year old male presents with his daughter for follow up DM.  Diabetes:   He has had improvement in his diabetes control in last 3 months.  unfortunately he is in the donut hole and cannot get his longacting insulin. Lab Results  Component Value Date   HGBA1C 6.9 (A) 08/08/2019  Using medications without difficulties: Hypoglycemic episodes: occ Hyperglycemic episodes: occ Feet problems: no uclers Blood Sugars averaging: eye exam within last year: Wt Readings from Last 3 Encounters:  08/08/19 161 lb 6 oz (73.2 kg)  05/02/19 170 lb (77.1 kg)  02/12/19 170 lb 9.6 oz (77.4 kg)  Body mass index is 25.66 kg/m.  He is currently on glipizide... Lantus  70 units  daily Novolog prn.  COVID 19 screen No recent travel or known exposure to COVID19 The patient denies respiratory symptoms of COVID 19 at this time.  The importance of social distancing was discussed today.   ROS    Past Medical History:  Diagnosis Date  . Adenomatous colon polyp   . Allergy   . Arthritis   . Cataract   . Complication of anesthesia    Per pt, hard to wake up past last colon in 2011!  . Diabetes mellitus   . Esophageal stricture   . GERD (gastroesophageal reflux disease)   . History of colon polyps 922/2011  . Hyperlipidemia   . IBS (irritable bowel syndrome)   . Pulmonary fibrosis ([place])     reports that he has never smoked. He has never used smokeless tobacco. He reports that he does not drink alcohol or use drugs.   Current Outpatient Medications:  .  ACCU-CHEK FASTCLIX LANCETS MISC, 1 Device by Other route 3 (three) times daily. Check blood sugars 2 to 3 times daily.  Dx: 250.02, Disp: 102 each, Rfl: 5 .  aspirin 81 MG tablet, Take 81 mg by mouth daily., Disp: , Rfl:  .  BD INSULIN SYRINGE U/F 31G X 5/16" 1 ML MISC, USE TO INJECT LANTUS INSULIN 2 TIMES DAILY, Disp: 180 each, Rfl: 3 .  benzonatate  (TESSALON) 100 MG capsule, TAKE 2 CAPSULES 3 TIMES DAILY, Disp: 180 capsule, Rfl: 3 .  Blood Glucose Monitoring Suppl (ACCU-CHEK NANO SMARTVIEW) W/DEVICE KIT, 1 kit by Other route 3 (three) times daily. Use to check blood sugars 2 to 3 times daily.  Dx: 250.02, Disp: 1 kit, Rfl: 0 .  COLCRYS 0.6 MG tablet, FOR FLARE TAKE 2 TABLETS ONCE THEN REPEAT 1 TABLET IN 2 HOURS IF PAINCONTINUES, THEN 1 TABLET DAILY, Disp: 60 tablet, Rfl: 0 .  ezetimibe (ZETIA) 10 MG tablet, TAKE ONE TABLET BY MOUTH EVERY DAY, Disp: 90 tablet, Rfl: 3 .  fenofibrate 160 MG tablet, Take 1 tablet (160 mg total) by mouth daily., Disp: 90 tablet, Rfl: 1 .  glipiZIDE (GLUCOTROL) 10 MG tablet, TAKE ONE TABLET BY MOUTH TWICE A DAY BEFORE A MEAL, Disp: 60 tablet, Rfl: 11 .  glucose blood test strip, TEST BLOOD SUGAR TWO TO THREE TIMES DAILY, Disp: 300 each, Rfl: 3 .  insulin aspart (NOVOLOG) 100 UNIT/ML injection, Use PRN if 2 hr before meal is greater than 200 or if having high carb meal, Disp: 3 vial, Rfl: 11 .  insulin glargine (LANTUS) 100 UNIT/ML injection, INJECT 55 UNITS SUBCUTANEOUSLY TWICE DAILY, Disp: 100 mL, Rfl: 3 .  meloxicam (MOBIC) 15 MG tablet, Take 15 mg by mouth daily  as needed for pain., Disp: , Rfl:  .  Multiple Vitamin (MULTIVITAMIN) tablet, Take 1 tablet by mouth daily.  , Disp: , Rfl:  .  Omega-3 Fatty Acids (FISH OIL) 1200 MG CAPS, Take 1 capsule by mouth daily., Disp: , Rfl:  .  omeprazole (PRILOSEC) 20 MG capsule, TAKE 1 CAPSULE EVERY DAY, Disp: 90 capsule, Rfl: 3 .  ondansetron (ZOFRAN ODT) 4 MG disintegrating tablet, Take 1 tablet (4 mg total) by mouth every 8 (eight) hours as needed for nausea or vomiting., Disp: 20 tablet, Rfl: 0 .  Pirfenidone (ESBRIET) 267 MG TABS, Take 3 tablets (801 mg total) by mouth 3 (three) times daily with meals., Disp: 270 tablet, Rfl: 11 .  simvastatin (ZOCOR) 40 MG tablet, TAKE 1 TABLET AT BEDTIME, Disp: 90 tablet, Rfl: 2 .  tamsulosin (FLOMAX) 0.4 MG CAPS capsule, TAKE 1 CAPSULE  BY MOUTH DAILY. IF NOT IMPROVING, INCREASE TO 2 TABLETS DAILY, Disp: 60 capsule, Rfl: 5 .  tiotropium (SPIRIVA HANDIHALER) 18 MCG inhalation capsule, Place 1 capsule (18 mcg total) into inhaler and inhale daily., Disp: 30 capsule, Rfl: 2  Current Facility-Administered Medications:  .  0.9 %  sodium chloride infusion, 500 mL, Intravenous, Once, Pyrtle, Lajuan Lines, MD   Observations/Objective: Blood pressure 120/70, pulse 90, temperature 98.6 F (37 C), temperature source Temporal, height 5' 6.5" (1.689 m), weight 161 lb 6 oz (73.2 kg), SpO2 96 %.  Physical Exam Constitutional:      Appearance: He is well-developed.  HENT:     Head: Normocephalic.     Right Ear: Hearing normal.     Left Ear: Hearing normal.     Nose: Nose normal.  Neck:     Thyroid: No thyroid mass or thyromegaly.     Vascular: No carotid bruit.     Trachea: Trachea normal.  Cardiovascular:     Rate and Rhythm: Normal rate and regular rhythm.     Pulses: Normal pulses.     Heart sounds: Heart sounds not distant. No murmur. No friction rub. No gallop.      Comments: No peripheral edema Pulmonary:     Effort: Pulmonary effort is normal. No respiratory distress.     Breath sounds: Normal breath sounds.  Musculoskeletal:     Right knee: He exhibits deformity. Tenderness found. Lateral joint line tenderness noted.     Comments: Right knee pain laterally.. no current redness or warmth.. better after taking colcrys  Skin:    General: Skin is warm and dry.     Findings: No rash.  Psychiatric:        Speech: Speech normal.        Behavior: Behavior normal.        Thought Content: Thought content normal.    Diabetic foot exam: Normal inspection No skin breakdown No calluses  Normal DP pulses Normal sensation to light touch and monofilament Nails normal   Assessment and Plan   Type 2 diabetes mellitus with diabetic neuropathy, unspecified (HCC) Improved control with weight loss ( no appetite as SE to Esbriet).   Encouragted weight maintenance and 3 melas a day with protein. Follow CBGs daily.. stay on reduced dose 70- UNits daily with glucotrol.  Call with measurements... may need to D/C glucotrol of causing lows.  He was encouraged to eat when taking this medication.  Gout of multiple sites Refilled colcrys... he has been eating more pinto beans so this may cause of increase in flares.  Will try to decrease but if  still 3-4 fares per year... will consider allopurinol to lower uric acid.  Diffuse Parynchymal Lung Disease Per Pulm.. on esbriet ( continued cough... refilled benzonatate)     [name], MD

## 2019-08-13 ENCOUNTER — Other Ambulatory Visit: Payer: Self-pay | Admitting: *Deleted

## 2019-08-13 ENCOUNTER — Other Ambulatory Visit: Payer: Self-pay | Admitting: Pharmacist

## 2019-08-13 ENCOUNTER — Encounter: Payer: Self-pay | Admitting: *Deleted

## 2019-08-13 NOTE — Patient Outreach (Signed)
[facility]) Care Management  [place]  08/13/2019  Brian Bass 09/09/1941 [id number]   Reason for referral: Medication Assistance (tessalon pearles, insulin)  Referral source: [facility] RN Current insurance: [employer]   Outreach:  Unsuccessful telephone call attempt #1 to patient.   HIPAA compliant voicemail left requesting a return call  Plan:  -I will mail patient an unsuccessful outreach letter.  -I will make another outreach attempt to patient within 3-4 business days.    Ralene Bathe, PharmD, [place] [phone]

## 2019-08-13 NOTE — Patient Outreach (Signed)
Brian Bass, Brian Bass  08/13/2019  Brian Bass 1941-05-05 [id number]  Successful telephone outreach to Brian Bass, 78 y/o male referred to [place] 08/07/2019 by PCP for screening, follow up on medication concerns with affording long acting insulin/ medications.  Patient has history including, but not limited to, DM- Type II with neuropathy (A1-C 7.9 in February 2020); HTN/ HLD; CAD; CKD- stage III; diffuse parenchymal pulmonary disease/ pulmonary fibrosis; GERD; and gout.  [facility] CM services discussed with patient, who provides verbal consent to complete screening call and to participate in [facility] CM services.  Patient provides verbal consent to speak with his wife Brian Bass, who is on [place] dated 01/22/2019; wife and patient both participate in screening call today while phone on speaker mode.  Today, patient reports that he "is doing fine" and he denies pain and new recent falls; states he visited with PCP (Brian Bass) on 08/08/2019 and got his flu shot at that time.  Patient denies recent hospital/ ED visits.  Patient sounds to be in no distress throughout entirety of phone call today.  Patient further reports:  Medications: -- Has all medicationsand takes as prescribed;denies questions about current medications.  -- Verbalizes good general understanding of the purpose, dosing, and scheduling of medications.   -- self-manage medications; wife occasionally assist by reminding patient to take as scheduled -- denies issues with swallowing medications -- medications were thoroughly reviewed with patient today/ medication list in EMR updated according to patient report- patient acknowledges that he is currently in the donut hole, and could use financial assistance with obtaining his Lantus insulin and his Benzonate [employer]) prescriptions-  discussed that I would place [place] Bass to address this concern, and patient is agreeable.  Patient reports that his daughter bought his last lantus insulin for him, which cost him $200 for 3 vials-- which would normally cost him "about $40.00."   Holiday representative needs: -- currently denies community resource needs, stating supportive family members that assist with care needs as indicated; patient reports that he is independent in all ADL's and iADL's and reports that he still drives and visits in farm in [place] regularly -- SDOH completed for transportation; depression; Financial resource strain; and food insecurity  Scientist, physiological (AD) Planning:   --reports does currently have exisisting AD in place for living will and HCPOA, and declines desire to make changes in either today; endorses that he is a "full code"  Self-health Bass of chronic disease state of DM: -- reports that he has had DM "a long time;" accurately verbalizes most recent A1-C of 7.9 (01/2019) -- self- manages insulin administration/ oral agents -- endorses eats "healthy," stating that his wife is a very good cook that only cooks healthy meals- however, he does admit that he "likes sweets" and has occassionally dietary indiscretions that he "knows he shouldn't do." -- exercises regularly: walks his dog 1 mile every day weather permits; has a Restaurant manager, fast food named "Brian Bass" -- likes to "stay busy and active" as a baseline, "that's just the way I am- I always have something going on to occupy myself." -- has lost 5-1 lbs over the last 6-8 months, "by just trying to eat better," endorses would like to continue losing weight, "so I look better for my wife;" and "to feel better in general"  Patient denies further issues, concerns, or problems today.  I  discussed placing referrals for [facility] RN Brian Bass for self-health Bass of DM and [place] Bass for medication assistance, as above, and  patient is agreeable.  Encouraged patient to actively engage with both disciplines once outreach is established.  Plan:   I will place [place] Bass for assessment of medication assistance for Lantus insulin and Benzonatate (Tessalon)  I will place [place] Bass for self-health Bass of chronic disease state of DM  I will send patient [facility] CM patient welcome letter  Brian Rack, RN, BSN, Brian Bass  [phone]

## 2019-08-14 ENCOUNTER — Other Ambulatory Visit: Payer: Self-pay

## 2019-08-16 ENCOUNTER — Other Ambulatory Visit: Payer: Self-pay | Admitting: Pharmacist

## 2019-08-16 ENCOUNTER — Ambulatory Visit: Payer: Self-pay | Admitting: Pharmacist

## 2019-08-16 NOTE — Patient Outreach (Signed)
[facility]) Care Management  [place]   08/16/2019  Brian Bass 1941-05-28 [id number]  Reason for referral: Medication Assistance (tessalon pearles, insulin)  Referral source: [facility] RN Current insurance: [employer]   PMHx includes but not limited to:  Idiopathic pulmonary fibrosis, T2DM, GERD, HLD  Outreach:  Successful telephone call with patient.  HIPAA identifiers verified.   Subjective:  Patient reports he is in the coverage gap and cannot afford his Lantus co-pay.  He states his daughter was able to pay for a 90 day supply for him and therefore he hopes this will last him through the end of the year.  He reports he checks CBGs BID (fasting, QHS) and knows how to to treat hypoglycemia.  He reports he has had diabetes "long enough" to know signs and symptoms and does not always check his blood sugar when it is low but rather eats a quick source of glucose. He reports he has not been using short -acting insulin at all and very rarely takes fenofibrate.  He also does not use Spiriva as he reports this was a trial medication to see if it helped with his IPF.  He states he receives his Esbriet via patient assistance program.    Objective: The ASCVD Risk score Brian Bussing DC Jr., et al., 2013) failed to calculate for the following reasons:   The valid total cholesterol range is 130 to 320 mg/dL  Lab Results  Component Value Date   CREATININE 1.13 01/23/2019   CREATININE 1.14 10/14/2018   CREATININE 1.04 08/17/2018    Lab Results  Component Value Date   HGBA1C 6.9 (A) 08/08/2019    Lipid Panel     Component Value Date/Time   CHOL 95 01/23/2019 0927   TRIG 106.0 01/23/2019 0927   HDL 35.20 (L) 01/23/2019 0927   CHOLHDL 3 01/23/2019 0927   VLDL 21.2 01/23/2019 0927   LDLCALC 39 01/23/2019 0927   LDLDIRECT 69.0 05/08/2018 0910    BP Readings from Last 3 Encounters:  08/08/19 120/70  05/02/19 122/65  02/12/19 128/70    No Known  Allergies  Medications Reviewed Today    Reviewed by Brian Royalty, RN (Registered Nurse) on 08/13/19 at 78  Med List Status: <None>  Medication Order Taking? Sig Documenting Provider Last Dose Status Informant  0.9 %  sodium chloride infusion [id number]   Pyrtle, Lajuan Lines, MD  Active   ACCU-CHEK FASTCLIX LANCETS MISC [id number]  1 Device by Other route 3 (three) times daily. Check blood sugars 2 to 3 times daily.  Dx: 250.02 Jinny Sanders, MD  Active   aspirin 81 MG tablet [id number]  Take 81 mg by mouth daily. [provider]  Active   BD INSULIN SYRINGE U/F 31G X 5/16" 1 ML MISC [id number]  USE TO INJECT LANTUS INSULIN 2 TIMES DAILY Bedsole, Amy E, MD  Active   benzonatate (TESSALON) 100 MG capsule [id number]  TAKE 2 CAPSULES 3 TIMES DAILY Bedsole, Amy E, MD  Active   Blood Glucose Monitoring Suppl (ACCU-CHEK NANO SMARTVIEW) W/DEVICE KIT [id number]  1 kit by Other route 3 (three) times daily. Use to check blood sugars 2 to 3 times daily.  Dx: 250.02 Diona Browner, Amy E, MD  Active   COLCRYS 0.6 MG tablet [id number]  FOR FLARE TAKE 2 TABLETS ONCE THEN REPEAT 1 TABLET IN 2 HOURS IF PAINCONTINUES, THEN 1 TABLET DAILY Bedsole, Amy E, MD  Active   ezetimibe (ZETIA) 10 MG tablet [id number]  TAKE ONE  TABLET BY MOUTH EVERY DAY Bedsole, Amy E, MD  Active   fenofibrate 160 MG tablet [id number]  Take 1 tablet (160 mg total) by mouth daily. Jinny Sanders, MD  Active   glipiZIDE (GLUCOTROL) 10 MG tablet [id number]  TAKE ONE TABLET BY MOUTH TWICE A DAY BEFORE A MEAL Bedsole, Amy E, MD  Active   glucose blood test strip [id number]  TEST BLOOD SUGAR TWO TO THREE TIMES DAILY Bedsole, Amy E, MD  Active   insulin aspart (NOVOLOG) 100 UNIT/ML injection [id number]  Use PRN if 2 hr before meal is greater than 200 or if having high carb meal Bedsole, Amy E, MD  Active   insulin glargine (LANTUS) 100 UNIT/ML injection [id number]  Inject 70 Units into the skin daily. [provider]  Active   meloxicam (MOBIC) 15 MG  tablet [id number]  Take 15 mg by mouth daily as needed for pain. [provider]  Active   Multiple Vitamin (MULTIVITAMIN) tablet [id number]  Take 1 tablet by mouth daily.   [provider]  Active   Omega-3 Fatty Acids (FISH OIL) 1200 MG CAPS [id number]  Take 1 capsule by mouth daily. [provider]  Active   omeprazole (PRILOSEC) 20 MG capsule [id number]  TAKE 1 CAPSULE EVERY DAY Bedsole, Amy E, MD  Active   ondansetron (ZOFRAN ODT) 4 MG disintegrating tablet [id number]  Take 1 tablet (4 mg total) by mouth every 8 (eight) hours as needed for nausea or vomiting. Jinny Sanders, MD  Active   Pirfenidone (ESBRIET) 267 MG TABS [id number]  Take 3 tablets (801 mg total) by mouth 3 (three) times daily with meals. Laverle Hobby, MD  Active   simvastatin (ZOCOR) 40 MG tablet [id number]  TAKE 1 TABLET AT BEDTIME Bedsole, Amy E, MD  Active   tamsulosin (FLOMAX) 0.4 MG CAPS capsule [id number]  TAKE 1 CAPSULE BY MOUTH DAILY. IF NOT IMPROVING, INCREASE TO 2 TABLETS DAILY Bedsole, Amy E, MD  Active   tiotropium (SPIRIVA HANDIHALER) 18 MCG inhalation capsule [id number]  Place 1 capsule (18 mcg total) into inhaler and inhale daily. Laverle Hobby, MD  Active           Assessment: Drugs sorted by system:  Hematologic: aspirin 49m  Cardiovascular: ezetimibe, fenofibrate, simvastatin  Pulmonary/Allergy: benzonatate, esbriet  Gastrointestinal: omeprazole  Endocrine: glipizide, insulin glargine  Genitourinary: tamsulosin  Vitamins/Minerals/Supplements: MVI  Miscellaneous: colchicine  Medication Review Findings:  . Removed Novolog, meloxicam, ondansetron, tiotropium from medication list as patient reports no doses > 30 days.   . Coadministration of fenofibrate and statin therapy may increase the risk of rhabdomyolysis compared to either agent alone.  Monitor closely and adjust as clinically warranted . Taking fenofibrate 2x weekly rather than daily.  Reviewed dose  instructions from provider.     Medication Assistance Findings:  Medication assistance needs identified: Lantus  Extra Help:  Not eligible for Extra Help Low Income Subsidy based on reported income and assets  Patient Assistance Programs: Lantus made by [employer]o Income requirement met: Yes o Out-of-pocket prescription expenditure met:   No (2% household income) - Patient has not met application requirements to apply for this program at this time.  - Alternative option is to apply for a similar product in the same therapeutic class which does not have a required out of pocket expenditure.  A possible substitution is  [employer], agricultural made by [employer]audep[employer]gram requirements with patient.  Patient declines wanting to apply for basaglar  at this time.  He thinks he has enough medication to last until 2021.  I advised him to reach out to me in 2021 to apply for insulin if needed.  Patient voiced understanding.   Plan: . Will close [facility] pharmacy case as no further medication needs identified at this time.  Am happy to assist in the future as needed.     Ralene Bathe, PharmD, [place] [phone]

## 2019-09-10 ENCOUNTER — Other Ambulatory Visit: Payer: Self-pay

## 2019-09-10 NOTE — Patient Outreach (Addendum)
[facility]) Care Management  09/10/2019  Brian Bass 1941-02-14 [id number]   Initial Assessment  Referral Date: 08/13/2019  Referral Source: [place] [facility] Referral Reason: "disease mgmt for Diabetes" Insurance: [employer]    Outreach attempt #1  to patient. No answer. RN CM left HIPAA compliant voicemail message along with contact info.      Plan: RN CM will send unsuccessful outreach letter to patient. RN CM will make outreach attempt to patient within one month.    Enzo Montgomery, RN,BSN,CCM [place] Management Telephonic Care Management Coordinator Direct Phone: [phone] Toll Free: [phone] Fax: [phone]

## 2019-09-11 ENCOUNTER — Ambulatory Visit: Payer: Self-pay

## 2019-09-11 ENCOUNTER — Ambulatory Visit: Payer: Medicare HMO

## 2019-09-17 ENCOUNTER — Telehealth: Payer: Self-pay | Admitting: *Deleted

## 2019-09-17 NOTE — Telephone Encounter (Signed)
Received from [employer] patient assistance:  Lantus Insulin Lot [id number] Exp: 12/04/2021 x 8 vials.  Left message for Ricard and [name] that the insulin is ready to be picked up at our office.

## 2019-09-23 ENCOUNTER — Other Ambulatory Visit: Payer: Self-pay

## 2019-09-23 NOTE — Patient Outreach (Signed)
[facility]) Care Management  09/23/2019  TRUST LEH August 23, 1941 [id number]   Initial Assessment    Outreach attempt #2 to patient. No answer at both numbers listed for patient.     Plan: RN CM will make outreach attempt to patient within one month.   Enzo Montgomery, RN,BSN,CCM [place] Management Telephonic Care Management Coordinator Direct Phone: [phone] Toll Free: [phone] Fax: [phone]

## 2019-09-24 ENCOUNTER — Ambulatory Visit: Payer: Self-pay

## 2019-09-30 ENCOUNTER — Other Ambulatory Visit: Payer: Self-pay

## 2019-09-30 DIAGNOSIS — Z20828 Contact with and (suspected) exposure to other viral communicable diseases: Secondary | ICD-10-CM | POA: Diagnosis not present

## 2019-09-30 DIAGNOSIS — Z20822 Contact with and (suspected) exposure to covid-19: Secondary | ICD-10-CM

## 2019-10-01 LAB — NOVEL CORONAVIRUS, NAA: SARS-CoV-2, NAA: NOT DETECTED

## 2019-10-02 ENCOUNTER — Telehealth: Payer: Self-pay | Admitting: General Practice

## 2019-10-02 NOTE — Telephone Encounter (Signed)
Negative COVID results given. Patient results "NOT Detected." Caller expressed understanding.

## 2019-10-21 ENCOUNTER — Other Ambulatory Visit: Payer: Self-pay

## 2019-10-21 NOTE — Patient Outreach (Signed)
[facility]) Care Management  10/21/2019  ASHKAN CHAMBERLAND 10-02-1941 [id number]   Initial Telephone Assessment     Outreach attempt #3 to patient. No answer at present.    Plan: RN CM will make outreach attempt to patient within two months.    Enzo Montgomery, RN,BSN,CCM [place] Management Telephonic Care Management Coordinator Direct Phone: [phone] Toll Free: [phone] Fax: [phone]

## 2019-10-22 ENCOUNTER — Ambulatory Visit: Payer: Self-pay

## 2019-10-29 ENCOUNTER — Other Ambulatory Visit: Payer: Self-pay | Admitting: Family Medicine

## 2019-11-06 ENCOUNTER — Encounter: Payer: Self-pay | Admitting: Pulmonary Disease

## 2019-11-06 ENCOUNTER — Other Ambulatory Visit: Payer: Self-pay

## 2019-11-06 ENCOUNTER — Ambulatory Visit (INDEPENDENT_AMBULATORY_CARE_PROVIDER_SITE_OTHER): Payer: Medicare HMO | Admitting: Pulmonary Disease

## 2019-11-06 VITALS — BP 118/84 | HR 87 | Temp 97.2°F | Ht 67.0 in | Wt 162.0 lb

## 2019-11-06 DIAGNOSIS — J849 Interstitial pulmonary disease, unspecified: Secondary | ICD-10-CM | POA: Diagnosis not present

## 2019-11-06 DIAGNOSIS — R0600 Dyspnea, unspecified: Secondary | ICD-10-CM | POA: Diagnosis not present

## 2019-11-06 DIAGNOSIS — R0609 Other forms of dyspnea: Secondary | ICD-10-CM

## 2019-11-06 DIAGNOSIS — R011 Cardiac murmur, unspecified: Secondary | ICD-10-CM | POA: Diagnosis not present

## 2019-11-06 MED ORDER — STIOLTO RESPIMAT 2.5-2.5 MCG/ACT IN AERS: 2.0000 | INHALATION_SPRAY | Freq: Every day | RESPIRATORY_TRACT | 6 refills | Status: DC

## 2019-11-06 NOTE — Patient Instructions (Signed)
1.  We are going to reschedule a high resolution CT of the chest  2.  We will reschedule breathing tests  3.  Will schedule a 2D echo  4.  We will schedule a determination of oxygen at nighttime  5.  See him in follow-up in 6 to 8 weeks

## 2019-11-07 ENCOUNTER — Ambulatory Visit (INDEPENDENT_AMBULATORY_CARE_PROVIDER_SITE_OTHER): Payer: Medicare HMO | Admitting: Family Medicine

## 2019-11-07 ENCOUNTER — Telehealth: Payer: Self-pay | Admitting: Pulmonary Disease

## 2019-11-07 ENCOUNTER — Encounter: Payer: Self-pay | Admitting: Family Medicine

## 2019-11-07 VITALS — BP 122/80 | HR 90 | Temp 97.6°F | Ht 66.5 in | Wt 161.8 lb

## 2019-11-07 DIAGNOSIS — Z794 Long term (current) use of insulin: Secondary | ICD-10-CM | POA: Diagnosis not present

## 2019-11-07 DIAGNOSIS — E1140 Type 2 diabetes mellitus with diabetic neuropathy, unspecified: Secondary | ICD-10-CM

## 2019-11-07 DIAGNOSIS — Z79899 Other long term (current) drug therapy: Secondary | ICD-10-CM | POA: Diagnosis not present

## 2019-11-07 LAB — POCT GLYCOSYLATED HEMOGLOBIN (HGB A1C): Hemoglobin A1C: 7.5 % — AB (ref 4.0–5.6)

## 2019-11-07 LAB — HM DIABETES FOOT EXAM

## 2019-11-07 NOTE — Patient Instructions (Addendum)
Decrease crackers in diet.  Work on low carb diet and walk as much as able.  Please stop at the lab to have labs drawn.

## 2019-11-07 NOTE — Telephone Encounter (Signed)
Ok

## 2019-11-07 NOTE — Telephone Encounter (Signed)
Pt called and stated that he would like to put the 2D Echo, PFT and CT Chest appointments off until after the COVID vaccine came out. Pt has been staying in and very concerned about catching this virus. Pt stated that he would go ahead and do the ONO, but would like to hold off on the other test until after getting the vaccine.  Pt advised to call me back at [phone] when he is ready to schedule.   Phone message sent to physician as FYI.  Rhonda J Cobb

## 2019-11-07 NOTE — Assessment & Plan Note (Addendum)
He has returned to high carb diet... he will try to get back on track. Continue current medicaiton regimen.  Check urine microalbumin.

## 2019-11-07 NOTE — Progress Notes (Signed)
Chief Complaint  Patient presents with  . Diabetes    History of Present Illness: HPI    78 year old male presents  For follow up DM.   Currently being treated by Dr. Duwayne Heck for ILD with  Esbriet.  Diabetes:  Worsened control in last 3 months.  He has been eating more incorrect items. Lab Results  Component Value Date   HGBA1C 7.5 (A) 11/07/2019  Using medications without difficulties: Hypoglycemic episodes:none Hyperglycemic episodes:none Feet problems:no ulcers Blood Sugars averaging: eye exam within last year: has scheudled   Per wife he needs to have liver functions tested today given    This visit occurred during the SARS-CoV-2 public health emergency.  Safety protocols were in place, including screening questions prior to the visit, additional usage of staff PPE, and extensive cleaning of exam room while observing appropriate contact time as indicated for disinfecting solutions.   COVID 19 screen:  No recent travel or known exposure to COVID19 The patient denies respiratory symptoms of COVID 19 at this time. The importance of social distancing was discussed today.     Review of Systems  Constitutional: Negative for chills and fever.  HENT: Negative for congestion and ear pain.   Eyes: Negative for pain and redness.  Respiratory: Positive for shortness of breath. Negative for cough.   Cardiovascular: Negative for chest pain, palpitations and leg swelling.  Gastrointestinal: Negative for abdominal pain, blood in stool, constipation, diarrhea, nausea and vomiting.  Genitourinary: Negative for dysuria.  Musculoskeletal: Negative for falls and myalgias.  Skin: Negative for rash.  Neurological: Negative for dizziness.  Psychiatric/Behavioral: Negative for depression. The patient is not nervous/anxious.       Past Medical History:  Diagnosis Date  . Adenomatous colon polyp   . Allergy   . Arthritis   . Cataract   . Complication of anesthesia    Per pt, hard  to wake up past last colon in 2011!  . Diabetes mellitus   . Esophageal stricture   . GERD (gastroesophageal reflux disease)   . History of colon polyps 922/2011  . Hyperlipidemia   . IBS (irritable bowel syndrome)   . Pulmonary fibrosis ([place])     reports that he has never smoked. He has never used smokeless tobacco. He reports that he does not drink alcohol or use drugs.   Current Outpatient Medications:  .  ACCU-CHEK FASTCLIX LANCETS MISC, 1 Device by Other route 3 (three) times daily. Check blood sugars 2 to 3 times daily.  Dx: 250.02, Disp: 102 each, Rfl: 5 .  aspirin 81 MG tablet, Take 81 mg by mouth daily., Disp: , Rfl:  .  BD INSULIN SYRINGE U/F 31G X 5/16" 1 ML MISC, USE TO INJECT LANTUS INSULIN 2 TIMES DAILY, Disp: 180 each, Rfl: 3 .  benzonatate (TESSALON) 100 MG capsule, TAKE 2 CAPSULES 3 TIMES DAILY, Disp: 180 capsule, Rfl: 3 .  Blood Glucose Monitoring Suppl (ACCU-CHEK NANO SMARTVIEW) W/DEVICE KIT, 1 kit by Other route 3 (three) times daily. Use to check blood sugars 2 to 3 times daily.  Dx: 250.02, Disp: 1 kit, Rfl: 0 .  COLCRYS 0.6 MG tablet, FOR FLARE TAKE 2 TABLETS ONCE THEN REPEAT 1 TABLET IN 2 HOURS IF PAINCONTINUES, THEN 1 TABLET DAILY, Disp: 60 tablet, Rfl: 0 .  ezetimibe (ZETIA) 10 MG tablet, TAKE ONE TABLET BY MOUTH EVERY DAY, Disp: 90 tablet, Rfl: 3 .  fenofibrate 160 MG tablet, Take 1 tablet (160 mg total) by mouth daily., Disp: 90  tablet, Rfl: 1 .  glipiZIDE (GLUCOTROL) 10 MG tablet, TAKE ONE TABLET BY MOUTH TWICE A DAY BEFORE A MEAL, Disp: 60 tablet, Rfl: 11 .  glucose blood test strip, TEST BLOOD SUGAR TWO TO THREE TIMES DAILY, Disp: 300 each, Rfl: 3 .  insulin glargine (LANTUS) 100 UNIT/ML injection, Inject 70 Units into the skin daily., Disp: , Rfl:  .  Multiple Vitamin (MULTIVITAMIN) tablet, Take 1 tablet by mouth daily.  , Disp: , Rfl:  .  omeprazole (PRILOSEC) 20 MG capsule, TAKE 1 CAPSULE EVERY DAY, Disp: 90 capsule, Rfl: 3 .  Pirfenidone (ESBRIET) 267 MG  TABS, Take 3 tablets (801 mg total) by mouth 3 (three) times daily with meals., Disp: 270 tablet, Rfl: 11 .  simvastatin (ZOCOR) 40 MG tablet, TAKE 1 TABLET AT BEDTIME, Disp: 90 tablet, Rfl: 0 .  tamsulosin (FLOMAX) 0.4 MG CAPS capsule, TAKE 1 CAPSULE BY MOUTH DAILY. IF NOT IMPROVING, INCREASE TO 2 TABLETS DAILY, Disp: 60 capsule, Rfl: 5 .  Tiotropium Bromide-Olodaterol (STIOLTO RESPIMAT) 2.5-2.5 MCG/ACT AERS, Inhale 2 puffs into the lungs daily., Disp: 4 g, Rfl: 6   Observations/Objective: Blood pressure 122/80, pulse 90, temperature 97.6 F (36.4 C), temperature source Temporal, height 5' 6.5" (1.689 m), weight 161 lb 12 oz (73.4 kg), SpO2 95 %.  Physical Exam Constitutional:      Appearance: He is well-developed.  HENT:     Head: Normocephalic.     Right Ear: Hearing normal.     Left Ear: Hearing normal.     Nose: Nose normal.  Neck:     Thyroid: No thyroid mass or thyromegaly.     Vascular: No carotid bruit.     Trachea: Trachea normal.  Cardiovascular:     Rate and Rhythm: Normal rate and regular rhythm.     Pulses: Normal pulses.     Heart sounds: Heart sounds not distant. No murmur. No friction rub. No gallop.      Comments: No peripheral edema Pulmonary:     Effort: Pulmonary effort is normal. No respiratory distress.     Breath sounds: Rales present.  Skin:    General: Skin is warm and dry.     Findings: No rash.  Psychiatric:        Speech: Speech normal.        Behavior: Behavior normal.        Thought Content: Thought content normal.      Diabetic foot exam: Normal inspection No skin breakdown No calluses  Normal DP pulses Normal sensation to light touch and monofilament Nails normal  Assessment and Plan      [name], MD

## 2019-11-07 NOTE — Addendum Note (Signed)
Addended by: Ellamae Sia on: 11/07/2019 03:33 PM   Modules accepted: Orders

## 2019-11-08 ENCOUNTER — Ambulatory Visit: Payer: Medicare HMO | Admitting: Family Medicine

## 2019-11-08 LAB — COMPREHENSIVE METABOLIC PANEL
ALT: 20 U/L (ref 0–53)
AST: 16 U/L (ref 0–37)
Albumin: 4.2 g/dL (ref 3.5–5.2)
Alkaline Phosphatase: 61 U/L (ref 39–117)
BUN: 15 mg/dL (ref 6–23)
CO2: 25 mEq/L (ref 19–32)
Calcium: 9.5 mg/dL (ref 8.4–10.5)
Chloride: 96 mEq/L (ref 96–112)
Creatinine, Ser: 1.12 mg/dL (ref 0.40–1.50)
GFR: 63.18 mL/min (ref >60.00)
Glucose, Bld: 117 mg/dL — ABNORMAL HIGH (ref 70–99)
Potassium: 4.4 mEq/L (ref 3.5–5.1)
Sodium: 130 mEq/L — ABNORMAL LOW (ref 135–145)
Total Bilirubin: 0.6 mg/dL (ref 0.2–1.2)
Total Protein: 7.3 g/dL (ref 6.0–8.3)

## 2019-11-08 LAB — MICROALBUMIN / CREATININE URINE RATIO
Creatinine,U: 97.4 mg/dL
Microalb Creat Ratio: 1.6 mg/g (ref 0.0–30.0)
Microalb, Ur: 1.5 mg/dL (ref 0.0–1.9)

## 2019-11-12 ENCOUNTER — Ambulatory Visit: Payer: Medicare HMO | Admitting: Family Medicine

## 2019-11-13 DIAGNOSIS — H5203 Hypermetropia, bilateral: Secondary | ICD-10-CM | POA: Diagnosis not present

## 2019-11-13 LAB — HM DIABETES EYE EXAM

## 2019-11-15 ENCOUNTER — Encounter: Payer: Self-pay | Admitting: Family Medicine

## 2019-11-15 DIAGNOSIS — J8489 Other specified interstitial pulmonary diseases: Secondary | ICD-10-CM | POA: Diagnosis not present

## 2019-11-18 ENCOUNTER — Telehealth: Payer: Self-pay | Admitting: Pulmonary Disease

## 2019-11-18 NOTE — Telephone Encounter (Signed)
ONO reviewed by Dr. Patsey Berthold- no need for nighttime oxygen. Lm for pt.

## 2019-11-19 NOTE — Telephone Encounter (Signed)
Pt's spouse, [name] [facility]) is aware of results and voiced her understanding.  Nothing further is needed.

## 2019-12-13 ENCOUNTER — Other Ambulatory Visit: Payer: Self-pay

## 2019-12-13 NOTE — Patient Outreach (Signed)
[place] [facility]) Care Management  12/13/2019  Brian Bass 14-Apr-1941 [id number]   Telephone Assessment   Outreach attempt #4 to patient. No answer at present and unable to leave message.    Plan: RN CM  Will make outreach attempt to patient within two months.   Enzo Montgomery, RN,BSN,CCM [place] Management Telephonic Care Management Coordinator Direct Phone: [phone] Toll Free: [phone] Fax: [phone]

## 2019-12-16 ENCOUNTER — Ambulatory Visit: Payer: Self-pay

## 2020-01-13 ENCOUNTER — Telehealth: Payer: Self-pay | Admitting: Family Medicine

## 2020-01-13 NOTE — Telephone Encounter (Signed)
Patient's Wife called today She is requesting a call from you in regards to the patient's insulin,

## 2020-01-14 NOTE — Telephone Encounter (Signed)
Spoke with [name].  She was inquiring about Medication Assistance for Brian Bass's insuline.  She thinks a new application needs to be sent to [employer].  I will forward message to [facility] who helped him get set up for this last year.  Wife can be reached at [phone].

## 2020-01-14 NOTE — Telephone Encounter (Signed)
Left message for Brian Bass to return my call.

## 2020-01-16 ENCOUNTER — Other Ambulatory Visit: Payer: Self-pay | Admitting: Pharmacy Technician

## 2020-01-16 ENCOUNTER — Other Ambulatory Visit: Payer: Self-pay | Admitting: Pharmacist

## 2020-01-16 NOTE — Patient Outreach (Addendum)
[place] [facility]) Care Management  [place]   01/16/2020  RONNELL CLINGER [birth year] [id number]  Reason for referral: Medication Assistance with insulin  Referral source: Dr. Diona Browner Current insurance: [facility] pharmacy contacted patient in 2020 regarding medication assistance for Lantus however patient not eligible at that time due to [place] requirement and declined substitution to [employer].   PMHx includes but not limited to:  T2DM, ILD, GERD, esophageal stricture, IBS, HLD, pulmonary fibrosis, arthritis, allergies  Outreach:  Unsuccessful telephone call attempt #1 to patient and spouse HIPAA compliant voicemail left requesting a return call  Plan:  -I will mail patient an unsuccessful outreach letter.  -I will make another outreach attempt to patient within 3-4 business days.   Ralene Bathe, PharmD, [place] [phone]   Addendum:  Incoming call from spouse.  She is agreeable to review medications.  She reports patient went into the coverage gap late last year 2020 and was able to apply to [employer] to receive a few months of medications at no charge.  She has not tried to fill it at the pharmacy yet this year.  She reports patient is also taking Stiolto inhaler which is expensive.    Objective: The ASCVD Risk score Mikey Bussing DC Jr., et al., 2013) failed to calculate for the following reasons:   The valid total cholesterol range is 130 to 320 mg/dL  Lab Results  Component Value Date   CREATININE 1.12 11/07/2019   CREATININE 1.13 01/23/2019   CREATININE 1.14 10/14/2018    Lab Results  Component Value Date   HGBA1C 7.5 (A) 11/07/2019    Lipid Panel     Component Value Date/Time   CHOL 95 01/23/2019 0927   TRIG 106.0 01/23/2019 0927   HDL 35.20 (L) 01/23/2019 0927   CHOLHDL 3 01/23/2019 0927   VLDL 21.2 01/23/2019 0927   LDLCALC 39 01/23/2019 0927   LDLDIRECT 69.0 05/08/2018 0910     BP Readings from Last 3 Encounters:  11/07/19 122/80  11/06/19 118/84  08/08/19 120/70    No Known Allergies  Medications Reviewed Today    Reviewed by Carter Kitten, CMA (Certified Medical Assistant) on 11/07/19 at 1410  Med List Status: <None>  Medication Order Taking? Sig Documenting Provider Last Dose Status Informant  ACCU-CHEK FASTCLIX LANCETS [place] [id number]  1 Device by Other route 3 (three) times daily. Check blood sugars 2 to 3 times daily.  Dx: 250.02 Jinny Sanders, MD  Active   aspirin 81 MG tablet [id number]  Take 81 mg by mouth daily. [provider]  Active   BD INSULIN SYRINGE U/F 31G X 5/16" 1 ML MISC [id number]  USE TO INJECT LANTUS INSULIN 2 TIMES DAILY Bedsole, Amy E, MD  Active   benzonatate (TESSALON) 100 MG capsule [id number]  TAKE 2 CAPSULES 3 TIMES DAILY Bedsole, Amy E, MD  Active   Blood Glucose Monitoring Suppl (ACCU-CHEK NANO SMARTVIEW) W/DEVICE KIT [id number]  1 kit by Other route 3 (three) times daily. Use to check blood sugars 2 to 3 times daily.  Dx: 250.02 Bedsole, Amy E, MD  Active   COLCRYS 0.6 MG tablet [id number]  FOR FLARE TAKE 2 TABLETS ONCE THEN REPEAT 1 TABLET IN 2 HOURS IF PAINCONTINUES, THEN 1 TABLET DAILY Bedsole, Amy E, MD  Active   ezetimibe (ZETIA) 10 MG tablet [id number]  TAKE ONE TABLET BY MOUTH EVERY DAY Diona Browner, Amy E, MD  Active   fenofibrate  160 MG tablet [id number]  Take 1 tablet (160 mg total) by mouth daily. Jinny Sanders, MD  Active            Med Note Iva Lento, Aldeen Riga E   Fri Aug 16, 2019 10:56 AM)    glipiZIDE (GLUCOTROL) 10 MG tablet [id number]  TAKE ONE TABLET BY MOUTH TWICE A DAY BEFORE A MEAL Bedsole, Amy E, MD  Active   glucose blood test strip [id number]  TEST BLOOD SUGAR TWO TO THREE TIMES DAILY Bedsole, Amy E, MD  Active   insulin glargine (LANTUS) 100 UNIT/ML injection [id number]  Inject 70 Units into the skin daily. [provider]  Active            Med Note Knox Royalty   Tue Aug 13, 2019 11:24 AM) Reports  usually takes 38- 100 Units q HS; now taking mainly 70 Units q HS  Multiple Vitamin (MULTIVITAMIN) tablet [id number]  Take 1 tablet by mouth daily.   [provider]  Active   omeprazole (PRILOSEC) 20 MG capsule [id number]  TAKE 1 CAPSULE EVERY DAY Bedsole, Amy E, MD  Active   Pirfenidone (ESBRIET) 267 MG TABS [id number]  Take 3 tablets (801 mg total) by mouth 3 (three) times daily with meals. Laverle Hobby, MD  Active   simvastatin (ZOCOR) 40 MG tablet [id number]  TAKE 1 TABLET AT BEDTIME Bedsole, Amy E, MD  Active   tamsulosin (FLOMAX) 0.4 MG CAPS capsule [id number]  TAKE 1 CAPSULE BY MOUTH DAILY. IF NOT IMPROVING, INCREASE TO 2 TABLETS DAILY Bedsole, Amy E, MD  Active   Tiotropium Bromide-Olodaterol (STIOLTO RESPIMAT) 2.5-2.5 MCG/ACT AERS [id number]  Inhale 2 puffs into the lungs daily. Tyler Pita, MD  Active           Assessment: Drugs sorted by system:  Hematologic: aspirin 53m  Cardiovascular: ezetimibe, simvastatin  Pulmonary/Allergy: benzonatate, Stiolto Respimat, Esbriet  Gastrointestinal: omperazole  Endocrine: glipizide, insulin glargine  Genitourinary: tamsulosin  Vitamins/Minerals/Supplements: MVI  Miscellaneous: colchicine  Medication Review Findings:  . Fenofibrate on medication list: patient no longer taking per his report . Colchicine + simvastatin: The combination of a statin with colchicine may result in an increased potential (greater than for each agent alone) for myopathy.  Patient only taking colchicine PRN, no issues with myopathy reported.    Medication Assistance Findings:  Medication assistance needs identified: Stiolto  Extra Help:  Not eligible for Extra Help Low Income Subsidy based on reported income and assets  Patient Assistance Programs: Lantus made by [employer] Income requirement met: Yes o Out-of-pocket prescription expenditure met:   No (2% household income) - Patient has not met application requirements to apply for  this program at this time.  - Alternative option is to apply for a similar product in the same therapeutic class which does not have a required out of pocket expenditure.  A possible substitution is  [employer], agricultural made by [place] requirements with patient.   - Patient declines substitution to [place] He would like to continue with Lantus.  Per review of [employer] Formular[employer], Lantus is included on the select insulin savings model and co-pay should be $35 / month even through the coverage gap.   Stiolto made by [place]ment met: Yes o Out-of-pocket prescription expenditure met:   Not Applicable - Patient has met application requirements to apply for this program.  - Reviewed program requirements with patient.    Additional medication assistance options reviewed  with patient as warranted:  Insurance OTC catalogue  Plan: . I will route patient assistance letter to [place] technician who will coordinate patient assistance program application process for medications listed above.  [facility] pharmacy technician will assist with obtaining all required documents from both patient and provider(s) and submit application(s) once completed.    Ralene Bathe, PharmD, [place] [phone]

## 2020-01-16 NOTE — Patient Outreach (Signed)
[place] [name] Care Management  01/16/2020  Brian Bass Oct 24, 1941 [id number]                                       Medication Assistance Referral  Referral From: [place] / [employer] Patient application portion: Emailed to [email or web address]  Provider application portion: Faxed  to Dr. Patsey Berthold Provider address/fax verified via: Office website   Follow up:  Will follow up with patient in 7-10 business days to confirm application(s) have been received.  Maud Deed Chana Bode [place] Certified Pharmacy Technician [place] Management Direct Dial:[phone]

## 2020-01-17 NOTE — Progress Notes (Signed)
Subjective:    Patient ID: Brian Bass, male    DOB: 10-Feb-1941, 79 y.o.   MRN: [id number]  HPI The patient is an 79 year old lifelong never smoker with a history of pulmonary fibrosis (IPF) who previously followed with Dr. Ashby Dawes.  I am assuming care due to Dr. Mathis Fare departure from the practice.  The patient's last saw Dr. Ashby Dawes in 04 June 2019.  The patient has been on Esbriet taking only 2 tablets 3 times daily due to intolerance from GI side effects.  He has been inquiring with regards to clinical trials.  Last CT scan of the chest was in March 2019 consistent with UIP/IPF, that CT scan also showed some potential calcifications of the aortic valve.  The patient has not had echocardiogram to evaluate this issue or other potential complications of IPF such as pulmonary hypertension.  The patient notes issues with occasional cough well-controlled with Tessalon Perles.  He has had no hemoptysis.  As noted his main complaint is that of increasing dyspnea.  He has not had any chest pain.  No orthopnea or paroxysmal nocturnal dyspnea.  No lower extremity edema.  Previous laboratory data:  Serology 02/27/18>> ACE, rheumatoid factor, ANA negative.  PFT tracings 3/26/19FVC is 63% predicted, FEV1 of 70% predicted, ratio 86% predicted.  There is no significant change with bronchodilator therapy.  Flow volume loop is consistent with restriction.DLCO is 54% predicted, DLCO is reduced at 56%.  CT high-resolution 02/27/18>> the bibasilar fibrotic changes have advanced significantly in the subpleural areas of traction bronchiectasis changes have worsened.  Right paratracheal lymphadenopathy minimally changed.  Chest x-ray 02/08/18; diffuse groundglass and interstitial changes throughout both lungs, right diaphragm is elevated.  Interstitial changes has progressed significantly since previous chest x-ray on 09/06/13.  CT chest 10/21/13, bilateral emphysematous changes, bilateral  subpleural reticular changes consistent with pulmonary fibrosis.   Chemistry 01/23/2019>> AST, ALT normal.  Overnight oximetry 10/08/2018>> performed on room air; time less than or equal to 88% was 0.7 minutes.  Oxygen was not required.  Available records thus far have been reviewed.  Dr. Mathis Fare notes reviewed.  Medications reviewed.  Review of Systems A 10 point review of systems was performed and it is as noted above otherwise negative.    Objective:   Physical Exam BP 118/84 (BP Location: Left Arm, Patient Position: Sitting, Cuff Size: Normal)   Pulse 87   Temp (!) 97.2 F (36.2 C) (Temporal)   Ht _0  (1.702 m)   Wt 162 lb (73.5 kg)   SpO2 98% Comment: RA  BMI 25.37 kg/m   GENERAL: Awake alert, no respiratory distress.  Mild conversational dyspnea.  Fully ambulatory HEAD: Normocephalic, atraumatic.  EYES: Pupils equal, round, reactive to light.  No scleral icterus.  MOUTH: Nose/mouth/throat not examined due to masking requirements for COVID 19. NECK: Supple. No thyromegaly. No nodules. No JVD.  Trachea midline. PULMONARY: Velcro crackles at the lower lung zones. CARDIOVASCULAR: S1 and S2. Regular rate and rhythm.  Grade 2/6 systolic ejection murmur left sternal border.  No gallop. GASTROINTESTINAL: Abdomen nondistended, soft. MUSCULOSKELETAL: No joint swelling, no clubbing, no edema.  NEUROLOGIC: No focal deficits noted.  Coherent. SKIN: Intact,warm,dry.  Limited exam shows no rashes  PSYCH: Mood and behavior appropriate.      Assessment & Plan:  Interstitial lung disease UIP/IPF Worsening dyspnea on exertion Will need to reassess status of IPF Schedule high-resolution CT rule out active phase of UIP Continue Esbriet for now Given worsening dyspnea 2D echo to evaluate  for potential pulmonary hypertension Overnight oximetry Follow-up 6 to 8 weeks time, call sooner should any new difficulties arise  Cardiac murmur 2D echo as noted above May add to his  issues with dyspnea    C. Derrill Kay, MD [place] [facility]   *This note was dictated using voice recognition software/Dragon.  Despite best efforts to proofread, errors can occur which can change the meaning.  Any change was purely unintentional.

## 2020-01-21 ENCOUNTER — Ambulatory Visit: Payer: Medicare HMO | Admitting: Pharmacist

## 2020-01-22 ENCOUNTER — Other Ambulatory Visit: Payer: Self-pay | Admitting: Family Medicine

## 2020-01-27 ENCOUNTER — Ambulatory Visit: Payer: Medicare HMO

## 2020-01-29 ENCOUNTER — Telehealth: Payer: Self-pay

## 2020-01-29 NOTE — Telephone Encounter (Signed)
LVM to call clinic, pt needs COVID screen, front door and back lab info 2.24.2021 TLJ

## 2020-01-30 ENCOUNTER — Telehealth: Payer: Self-pay | Admitting: Family Medicine

## 2020-01-30 DIAGNOSIS — E1140 Type 2 diabetes mellitus with diabetic neuropathy, unspecified: Secondary | ICD-10-CM

## 2020-01-30 NOTE — Telephone Encounter (Signed)
-----  Message from Ellamae Sia sent at 01/17/2020  9:50 AM EST ----- Regarding: Lab orders for Friday, 2.26.21  AWV lab orders, please.

## 2020-01-31 ENCOUNTER — Other Ambulatory Visit: Payer: Self-pay | Admitting: Pharmacy Technician

## 2020-01-31 ENCOUNTER — Ambulatory Visit (INDEPENDENT_AMBULATORY_CARE_PROVIDER_SITE_OTHER): Payer: Medicare HMO

## 2020-01-31 ENCOUNTER — Other Ambulatory Visit (INDEPENDENT_AMBULATORY_CARE_PROVIDER_SITE_OTHER): Payer: Medicare HMO

## 2020-01-31 ENCOUNTER — Other Ambulatory Visit: Payer: Self-pay

## 2020-01-31 DIAGNOSIS — Z0000 Encounter for general adult medical examination without abnormal findings: Secondary | ICD-10-CM | POA: Diagnosis not present

## 2020-01-31 DIAGNOSIS — Z794 Long term (current) use of insulin: Secondary | ICD-10-CM | POA: Diagnosis not present

## 2020-01-31 DIAGNOSIS — E1140 Type 2 diabetes mellitus with diabetic neuropathy, unspecified: Secondary | ICD-10-CM

## 2020-01-31 LAB — COMPREHENSIVE METABOLIC PANEL
ALT: 21 U/L (ref 0–53)
AST: 18 U/L (ref 0–37)
Albumin: 4.1 g/dL (ref 3.5–5.2)
Alkaline Phosphatase: 68 U/L (ref 39–117)
BUN: 15 mg/dL (ref 6–23)
CO2: 27 mEq/L (ref 19–32)
Calcium: 9.6 mg/dL (ref 8.4–10.5)
Chloride: 97 mEq/L (ref 96–112)
Creatinine, Ser: 1.15 mg/dL (ref 0.40–1.50)
GFR: 61.25 mL/min (ref >60.00)
Glucose, Bld: 183 mg/dL — ABNORMAL HIGH (ref 70–99)
Potassium: 4.5 mEq/L (ref 3.5–5.1)
Sodium: 130 mEq/L — ABNORMAL LOW (ref 135–145)
Total Bilirubin: 0.6 mg/dL (ref 0.2–1.2)
Total Protein: 7.5 g/dL (ref 6.0–8.3)

## 2020-01-31 LAB — LIPID PANEL
Cholesterol: 143 mg/dL (ref 0–200)
HDL: 29.7 mg/dL — ABNORMAL LOW (ref >39.00)
LDL Cholesterol: 77 mg/dL (ref 0–99)
NonHDL: 113.54
Total CHOL/HDL Ratio: 5
Triglycerides: 184 mg/dL — ABNORMAL HIGH (ref 0.0–149.0)
VLDL: 36.8 mg/dL (ref 0.0–40.0)

## 2020-01-31 NOTE — Addendum Note (Signed)
Addended by: Cloyd Stagers on: 01/31/2020 08:14 AM   Modules accepted: Orders

## 2020-01-31 NOTE — Patient Outreach (Signed)
[place] [facility]) Care Management  01/31/2020  Brian Bass Sep 25, 1941 [id number]    Return call placed to patient's wife regarding patient assistance application(s) for Stiolto , HIPAA identifiers verified. [name] needed me to provide her with [facility] mailing address so that she can mail completed application and required documents to the office.  Follow up:  Will submit to comoany once all documents have been obtained.  Maud Deed Chana Bode [place] Certified Pharmacy Technician [place] Management Direct Dial:[phone]

## 2020-01-31 NOTE — Progress Notes (Signed)
Subjective:   WILLMER FELLERS is a 79 y.o. male who presents for Medicare Annual/Subsequent preventive examination.  Review of Systems: N/A   This visit is being conducted through telemedicine via telephone at the nurse health advisor's home address due to the COVID-19 pandemic. This patient has given me verbal consent via doximity to conduct this visit, patient states they are participating from their home address. Patient and myself are on the telephone call. There is no referral for this visit. Some vital signs may be absent or patient reported.    Patient identification: identified by name, DOB, and current address   Cardiac Risk Factors include: advanced age (>27mn, >>41women);diabetes mellitus;hypertension;male gender     Objective:    Vitals: There were no vitals taken for this visit.  There is no height or weight on file to calculate BMI.  Advanced Directives 01/31/2020 08/13/2019 01/23/2019 10/14/2018 01/12/2018 07/14/2016 01/11/2016  Does Patient Have a Medical Advance Directive? Yes Yes No No Yes Yes No  Type of [place] will - - - Living will -  Does patient want to make changes to medical advance directive? - No - Patient declined - - Yes (MAU/Ambulatory/Procedural Areas - Information given) No - Patient declined -  Copy of [place] Chart? Yes - validated most recent copy scanned in chart (See row information) - - - - No - copy requested -  Would patient like information on creating a medical advance directive? - - No - Patient declined - - - No - patient declined information    Tobacco Social History   Tobacco Use  Smoking Status Never Smoker  Smokeless Tobacco Never Used     Counseling given: Not Answered   Clinical Intake:  Pre-visit preparation completed: Yes  Pain : No/denies pain     Nutritional Risks: None Diabetes: Yes CBG done?: No Did pt.  bring in CBG monitor from home?: No  How often do you need to have someone help you when you read instructions, pamphlets, or other written materials from your doctor or pharmacy?: 1 - Never What is the last grade level you completed in school?: 1 year of college  Interpreter Needed?: No  Information entered by :: CJohnson, LPN  Past Medical History:  Diagnosis Date  . Adenomatous colon polyp   . Allergy   . Arthritis   . Cataract   . Complication of anesthesia    Per pt, hard to wake up past last colon in 2011!  . Diabetes mellitus   . Esophageal stricture   . GERD (gastroesophageal reflux disease)   . History of colon polyps 922/2011  . Hyperlipidemia   . IBS (irritable bowel syndrome)   . [place]monary fibrosis ([place]    Past Surgical History:  Procedure Laterality Date  . COLONOSCOPY    . CORONARY ARTERY BYPASS GRAFT  10-2010   3 vessels  . UPPER GASTROINTESTINAL ENDOSCOPY     Family History  Problem Relation Age of Onset  . Heart failure Mother   . Aneurysm Father   . Alcohol abuse Brother   . Colon cancer Cousin   . Esophageal cancer Neg Hx   . Rectal cancer Neg Hx   . Stomach cancer Neg Hx    Social History   Socioeconomic History  . Marital status: Married    Spouse name: Not on file  . Number of children: Not on file  . Years of education:  Not on file  . Highest education level: Not on file  Occupational History  . 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 Smoking status: Never Smoker  . Smokeless tobacco: Never Used  Substance and Sexual Activity  . Alcohol use: No  . Drug use: No  . Sexual activity: Never  Other Topics Concern  . Not on file  Social History Narrative   Patient does not get regular exercise, but manual labor with farm work   Diet: fruits and veggies, fish, chicken, water       Social Determinants of Health   Financial Resource Strain: Low Risk   . Difficulty of Paying Living Expenses: Not hard  at all  Food Insecurity: No Food Insecurity  . Worried About Charity fundraiser in the Last Year: Never true  . Ran Out of Food in the Last Year: Never true  Transportation Needs: No Transportation Needs  . Lack of Transportation (Medical): No  . Lack of Transportation (Non-Medical): No  Physical Activity: Sufficiently Active  . Days of Exercise per Week: 7 days  . Minutes of Exercise per Session: 30 min  Stress: No Stress Concern Present  . Feeling of Stress : Not at all  Social Connections:   . Frequency of Communication with Friends and Family: Not on file  . Frequency of Social Gatherings with Friends and Family: Not on file  . Attends Religious Services: Not on file  . Active Member of Clubs or Organizations: Not on file  . Attends Archivist Meetings: Not on file  . Marital Status: Not on file    Outpatient Encounter Medications as of 01/31/2020  Medication Sig  . ACCU-CHEK FASTCLIX LANCETS MISC 1 Device by Other route 3 (three) times daily. Check blood sugars 2 to 3 times daily.  Dx: 250.02  . aspirin 81 MG tablet Take 81 mg by mouth daily.  . BD INSULIN SYRINGE U/F 31G X 5/16" 1 ML MISC USE TO INJECT LANTUS INSULIN 2 TIMES DAILY  . Blood Glucose Monitoring Suppl (ACCU-CHEK NANO SMARTVIEW) W/DEVICE KIT 1 kit by Other route 3 (three) times daily. Use to check blood sugars 2 to 3 times daily.  Dx: 250.02  . COLCRYS 0.6 MG tablet FOR FLARE TAKE 2 TABLETS ONCE THEN REPEAT 1 TABLET IN 2 HOURS IF PAINCONTINUES, THEN 1 TABLET DAILY  . ezetimibe (ZETIA) 10 MG tablet TAKE 1 TABLET BY MOUTH DAILY  . fenofibrate 160 MG tablet Take 1 tablet (160 mg total) by mouth daily.  [name] glipiZIDE (GLUCOTROL) 10 MG tablet TAKE ONE TABLET BY MOUTH TWICE A DAY BEFORE A MEAL  . glucose blood test strip TEST BLOOD SUGAR TWO TO THREE TIMES DAILY  . insulin glargine (LANTUS) 100 UNIT/ML injection Inject 70 Units into the skin daily.  . Multiple Vitamin (MULTIVITAMIN) tablet Take 1 tablet by mouth  daily.    [name] omeprazole (PRILOSEC) 20 MG capsule TAKE 1 CAPSULE EVERY DAY  . Pirfenidone (ESBRIET) 267 MG TABS Take 3 tablets (801 mg total) by mouth 3 (three) times daily with meals.  . simvastatin (ZOCOR) 40 MG tablet TAKE 1 TABLET AT BEDTIME  . tamsulosin (FLOMAX) 0.4 MG CAPS capsule TAKE 1 CAPSULE BY MOUTH DAILY. IF NOT IMPROVING, INCREASE TO 2 TABLETS DAILY  . Tiotropium Bromide-Olodaterol (STIOLTO RESPIMAT) 2.5-2.5 MCG/ACT AERS Inhale 2 puffs into the lungs daily.  . benzonatate (TESSALON) 100 MG capsule TAKE 2 CAPSULES 3 TIMES DAILY (Patient not taking: Reported  on 01/31/2020)   No facility-administered encounter medications on file as of 01/31/2020.    Activities of Daily Living In your present state of health, do you have any difficulty performing the following activities: 01/31/2020  Hearing? N  Vision? N  Difficulty concentrating or making decisions? N  Walking or climbing stairs? N  Dressing or bathing? N  Doing errands, shopping? N  Preparing Food and eating ? N  Using the Toilet? N  In the past six months, have you accidently leaked urine? N  Do you have problems with loss of bowel control? N  Managing your Medications? N  Managing your Finances? N  Housekeeping or managing your Housekeeping? N  Some recent data might be hidden    Patient Care Team: Jinny Sanders, MD as PCP - [facility] [place], Tomasa Blase, RN as [place], [facility] as [place] (Pharmacist) Adaline Sill, CPhT as [employer] banker, corporate)   Assessment:   This is a routine wellness examination for Dravyn.  Exercise Activities and Dietary recommendations Current Exercise Habits: Home exercise routine, Type of exercise: walking, Time (Minutes): 30, Frequency (Times/Week): 7, Weekly Exercise (Minutes/Week): 210, Intensity: Moderate, Exercise limited by: None identified  Goals    .  Exercise 150 min/wk Moderate Activity     Weather permitting, I will continue to walk at least 1 mile daily.     . Patient Stated     01/31/2020, I will continue to walk 1 mile everyday.        Fall Risk Fall Risk  01/31/2020 01/23/2019 01/12/2018 07/14/2016 07/14/2015  Falls in the past year? 0 1 Yes No No  Comment - fall due to bieng hypoglycemic; injury to right side of ribs; ER visit feet were tangled in cloth causing fall; fractured ribs - -  Number falls in past yr: 0 0 1 - -  Injury with Fall? 0 1 Yes - -  Risk for fall due to : Medication side effect - - - -  Follow up Falls evaluation completed;Falls prevention discussed - - - -   Is the patient's home free of loose throw rugs in walkways, pet beds, electrical cords, etc?   yes      Grab bars in the bathroom? yes      Handrails on the stairs?   yes      Adequate lighting?   yes  Timed Get Up and Go Performed: N/A  Depression Screen PHQ 2/9 Scores 01/31/2020 08/13/2019 01/23/2019 01/12/2018  PHQ - 2 Score 1 0 0 0  PHQ- 9 Score 1 - 0 0    Cognitive Function MMSE - Mini Mental State Exam 01/31/2020 01/23/2019 01/12/2018 07/14/2016  Orientation to time _0 Orientation to Place _1 Registration _2 Attention/ Calculation 5 0 0 0  Recall 3 3 0 3  Recall-comments - - unable to recall 3 of 3 words -  Language- name 2 objects - 0 0 0  Language- repeat _3 Language- follow 3 step command - _4 Language- read & follow direction - 0 0 0  Write a sentence - 0 0 0  Copy design - 0 0 0  Total score - _5 Mini Cog  Mini-Cog screen was completed. Maximum score is 22. A value of 0 denotes this part of the MMSE was not completed  or the patient failed this part of the Mini-Cog screening.       Immunization History  Administered Date(s) Administered  . Fluad Quad(high Dose 65+) 08/08/2019  . Influenza Split 10/05/2011, 10/11/2012  . Influenza Whole 11/12/2010  . Influenza,inj,Quad PF,6+ Mos 09/06/2013,  09/24/2014, 10/02/2015, 09/13/2016, 09/29/2017, 08/24/2018  . Pneumococcal Conjugate-13 01/12/2018  . Pneumococcal Polysaccharide-23 12/06/2007  . Td 08/10/2010    Qualifies for Shingles Vaccine: Yes  Screening Tests Health Maintenance  Topic Date Due  . HEMOGLOBIN A1C  05/07/2020  . TETANUS/TDAP  08/10/2020  . FOOT EXAM  11/06/2020  . URINE MICROALBUMIN  11/06/2020  . OPHTHALMOLOGY EXAM  11/12/2020  . COLONOSCOPY  02/05/2022  . INFLUENZA VACCINE  Completed  . PNA vac Low Risk Adult  Completed   Cancer Screenings: Lung: Low Dose CT Chest recommended if Age 71-80 years, 30 pack-year currently smoking OR have quit w/in 15years. Patient does not qualify. Colorectal: completed 02/06/2019  Additional Screenings:  Hepatitis C Screening: N/A      Plan:   Patient will continue to walk 1 mile everyday.   I have personally reviewed and noted the following in the patient's chart:   . Medical and social history . Use of alcohol, tobacco or illicit drugs  . Current medications and supplements . Functional ability and status . Nutritional status . Physical activity . Advanced directives . List of other physicians . Hospitalizations, surgeries, and ER visits in previous 12 months . Vitals . Screenings to include cognitive, depression, and falls . Referrals and appointments  In addition, I have reviewed and discussed with patient certain preventive protocols, quality metrics, and best practice recommendations. A written personalized care plan for preventive services as well as general preventive health recommendations were provided to patient.     Andrez Grime, LPN  04/08/6978

## 2020-01-31 NOTE — Patient Instructions (Signed)
Brian Bass , Thank you for taking time to come for your Medicare Wellness Visit. I appreciate your ongoing commitment to your health goals. Please review the following plan we discussed and let me know if I can assist you in the future.   Screening recommendations/referrals: Colonoscopy: Up to date, completed 02/06/2019 Recommended yearly ophthalmology/optometry visit for glaucoma screening and checkup Recommended yearly dental visit for hygiene and checkup  Vaccinations: Influenza vaccine: Up to date, completed 08/08/2019 Pneumococcal vaccine: Completed series Tdap vaccine: Up to date, completed 08/10/2010 Shingles vaccine: discussed    Advanced directives: copy in chart  Conditions/risks identified: diabetes, hypertension  Next appointment: 02/04/2020 @ 11:20 am   Preventive Care 80 Years and Older, Male Preventive care refers to lifestyle choices and visits with your health care provider that can promote health and wellness. What does preventive care include?  A yearly physical exam. This is also called an annual well check.  Dental exams once or twice a year.  Routine eye exams. Ask your health care provider how often you should have your eyes checked.  Personal lifestyle choices, including:  Daily care of your teeth and gums.  Regular physical activity.  Eating a healthy diet.  Avoiding tobacco and drug use.  Limiting alcohol use.  Practicing safe sex.  Taking low doses of aspirin every day.  Taking vitamin and mineral supplements as recommended by your health care provider. What happens during an annual well check? The services and screenings done by your health care provider during your annual well check will depend on your age, overall health, lifestyle risk factors, and family history of disease. Counseling  Your health care provider may ask you questions about your:  Alcohol use.  Tobacco use.  Drug use.  Emotional well-being.  Home and relationship  well-being.  Sexual activity.  Eating habits.  History of falls.  Memory and ability to understand (cognition).  Work and work Statistician. Screening  You may have the following tests or measurements:  Height, weight, and BMI.  Blood pressure.  Lipid and cholesterol levels. These may be checked every 5 years, or more frequently if you are over 80 years old.  Skin check.  Lung cancer screening. You may have this screening every year starting at age 80 if you have a 30-pack-year history of smoking and currently smoke or have quit within the past 15 years.  Fecal occult blood test (FOBT) of the stool. You may have this test every year starting at age 80.  Flexible sigmoidoscopy or colonoscopy. You may have a sigmoidoscopy every 5 years or a colonoscopy every 10 years starting at age 80.  Prostate cancer screening. Recommendations will vary depending on your family history and other risks.  Hepatitis C blood test.  Hepatitis B blood test.  Sexually transmitted disease (STD) testing.  Diabetes screening. This is done by checking your blood sugar (glucose) after you have not eaten for a while (fasting). You may have this done every 1-3 years.  Abdominal aortic aneurysm (AAA) screening. You may need this if you are a current or former smoker.  Osteoporosis. You may be screened starting at age 80 if you are at high risk. Talk with your health care provider about your test results, treatment options, and if necessary, the need for more tests. Vaccines  Your health care provider may recommend certain vaccines, such as:  Influenza vaccine. This is recommended every year.  Tetanus, diphtheria, and acellular pertussis (Tdap, Td) vaccine. You may need a Td booster every  10 years.  Zoster vaccine. You may need this after age 70.  Pneumococcal 13-valent conjugate (PCV13) vaccine. One dose is recommended after age 80.  Pneumococcal polysaccharide (PPSV23) vaccine. One dose is  recommended after age 80. Talk to your health care provider about which screenings and vaccines you need and how often you need them. This information is not intended to replace advice given to you by your health care provider. Make sure you discuss any questions you have with your health care provider. Document Released: 12/18/2015 Document Revised: 08/10/2016 Document Reviewed: 09/22/2015 Elsevier Interactive Patient Education  2017 [place] Prevention in the Home Falls can cause injuries. They can happen to people of all ages. There are many things you can do to make your home safe and to help prevent falls. What can I do on the outside of my home?  Regularly fix the edges of walkways and driveways and fix any cracks.  Remove anything that might make you trip as you walk through a door, such as a raised step or threshold.  Trim any bushes or trees on the path to your home.  Use bright outdoor lighting.  Clear any walking paths of anything that might make someone trip, such as rocks or tools.  Regularly check to see if handrails are loose or broken. Make sure that both sides of any steps have handrails.  Any raised decks and porches should have guardrails on the edges.  Have any leaves, snow, or ice cleared regularly.  Use sand or salt on walking paths during winter.  Clean up any spills in your garage right away. This includes oil or grease spills. What can I do in the bathroom?  Use night lights.  Install grab bars by the toilet and in the tub and shower. Do not use towel bars as grab bars.  Use non-skid mats or decals in the tub or shower.  If you need to sit down in the shower, use a plastic, non-slip stool.  Keep the floor dry. Clean up any water that spills on the floor as soon as it happens.  Remove soap buildup in the tub or shower regularly.  Attach bath mats securely with double-sided non-slip rug tape.  Do not have throw rugs and other things on  the floor that can make you trip. What can I do in the bedroom?  Use night lights.  Make sure that you have a light by your bed that is easy to reach.  Do not use any sheets or blankets that are too big for your bed. They should not hang down onto the floor.  Have a firm chair that has side arms. You can use this for support while you get dressed.  Do not have throw rugs and other things on the floor that can make you trip. What can I do in the kitchen?  Clean up any spills right away.  Avoid walking on wet floors.  Keep items that you use a lot in easy-to-reach places.  If you need to reach something above you, use a strong step stool that has a grab bar.  Keep electrical cords out of the way.  Do not use floor polish or wax that makes floors slippery. If you must use wax, use non-skid floor wax.  Do not have throw rugs and other things on the floor that can make you trip. What can I do with my stairs?  Do not leave any items on the stairs.  Make sure  that there are handrails on both sides of the stairs and use them. Fix handrails that are broken or loose. Make sure that handrails are as long as the stairways.  Check any carpeting to make sure that it is firmly attached to the stairs. Fix any carpet that is loose or worn.  Avoid having throw rugs at the top or bottom of the stairs. If you do have throw rugs, attach them to the floor with carpet tape.  Make sure that you have a light switch at the top of the stairs and the bottom of the stairs. If you do not have them, ask someone to add them for you. What else can I do to help prevent falls?  Wear shoes that:  Do not have high heels.  Have rubber bottoms.  Are comfortable and fit you well.  Are closed at the toe. Do not wear sandals.  If you use a stepladder:  Make sure that it is fully opened. Do not climb a closed stepladder.  Make sure that both sides of the stepladder are locked into place.  Ask someone to  hold it for you, if possible.  Clearly mark and make sure that you can see:  Any grab bars or handrails.  First and last steps.  Where the edge of each step is.  Use tools that help you move around (mobility aids) if they are needed. These include:  Canes.  Walkers.  Scooters.  Crutches.  Turn on the lights when you go into a dark area. Replace any light bulbs as soon as they burn out.  Set up your furniture so you have a clear path. Avoid moving your furniture around.  If any of your floors are uneven, fix them.  If there are any pets around you, be aware of where they are.  Review your medicines with your doctor. Some medicines can make you feel dizzy. This can increase your chance of falling. Ask your doctor what other things that you can do to help prevent falls. This information is not intended to replace advice given to you by your health care provider. Make sure you discuss any questions you have with your health care provider. Document Released: 09/17/2009 Document Revised: 04/28/2016 Document Reviewed: 12/26/2014 Elsevier Interactive Patient Education  2017 [employer].

## 2020-01-31 NOTE — Progress Notes (Signed)
No critical labs need to be addressed urgently. We will discuss labs in detail at upcoming office visit.

## 2020-01-31 NOTE — Progress Notes (Signed)
PCP notes:  Health Maintenance: No gaps noted   Abnormal Screenings: none   Patient concerns: none   Nurse concerns: none   Next PCP appt: 02/04/2020 @ 11:20 am

## 2020-02-01 LAB — HEMOGLOBIN A1C
Hgb A1c MFr Bld: 8.1 % of total Hgb — ABNORMAL HIGH (ref <5.7)
Mean Plasma Glucose: 186 (calc)
eAG (mmol/L): 10.3 (calc)

## 2020-02-03 NOTE — Progress Notes (Signed)
No critical labs need to be addressed urgently. We will discuss labs in detail at upcoming office visit.   

## 2020-02-04 ENCOUNTER — Ambulatory Visit (INDEPENDENT_AMBULATORY_CARE_PROVIDER_SITE_OTHER): Payer: Medicare HMO | Admitting: Family Medicine

## 2020-02-04 ENCOUNTER — Other Ambulatory Visit: Payer: Self-pay

## 2020-02-04 VITALS — BP 130/64 | HR 80 | Temp 98.0°F | Resp 16 | Ht 66.25 in | Wt 166.2 lb

## 2020-02-04 DIAGNOSIS — E7800 Pure hypercholesterolemia, unspecified: Secondary | ICD-10-CM | POA: Diagnosis not present

## 2020-02-04 DIAGNOSIS — N182 Chronic kidney disease, stage 2 (mild): Secondary | ICD-10-CM

## 2020-02-04 DIAGNOSIS — Z0000 Encounter for general adult medical examination without abnormal findings: Secondary | ICD-10-CM

## 2020-02-04 DIAGNOSIS — I10 Essential (primary) hypertension: Secondary | ICD-10-CM | POA: Diagnosis not present

## 2020-02-04 DIAGNOSIS — E1140 Type 2 diabetes mellitus with diabetic neuropathy, unspecified: Secondary | ICD-10-CM | POA: Diagnosis not present

## 2020-02-04 DIAGNOSIS — E1169 Type 2 diabetes mellitus with other specified complication: Secondary | ICD-10-CM | POA: Diagnosis not present

## 2020-02-04 DIAGNOSIS — Z794 Long term (current) use of insulin: Secondary | ICD-10-CM | POA: Diagnosis not present

## 2020-02-04 DIAGNOSIS — E1159 Type 2 diabetes mellitus with other circulatory complications: Secondary | ICD-10-CM | POA: Diagnosis not present

## 2020-02-04 DIAGNOSIS — E1122 Type 2 diabetes mellitus with diabetic chronic kidney disease: Secondary | ICD-10-CM | POA: Diagnosis not present

## 2020-02-04 NOTE — Assessment & Plan Note (Signed)
On zetia and statin.[name] almost at goal.

## 2020-02-04 NOTE — Progress Notes (Signed)
Chief Complaint  Patient presents with  . Annual Exam    part 2    History of Present Illness: HPI  The patient presents for complete physical and review of chronic health problems. He/She also has the following acute concerns today: none  The patient saw a LPN or RN for medicare wellness visit.  Prevention and wellness was reviewed in detail. Note reviewed and important notes copied below. Health Maintenance: No gaps noted Abnormal Screenings: none   Followed by pulm for chronic lung disease ( ILD).. On Esbriet.  progressively worsening over time. Not able to walk up hills like he used to.  Elevated Cholesterol: LDL almost at goal on zetia, statin Lab Results  Component Value Date   CHOL 143 01/31/2020   HDL 29.70 (L) 01/31/2020   LDLCALC 77 01/31/2020   LDLDIRECT 69.0 05/08/2018   TRIG 184.0 (H) 01/31/2020   CHOLHDL 5 01/31/2020  Using medications without problems: Muscle aches:  Diet compliance:  poor Exercise: daily walking Other complaints: CAD  Hypertension:   Good control Using medication without problems or lightheadedness:  Syncopal episode with coughing attack. Chest pain with exertion:none Edema:none Short of breath:yes Average home BPs: Other issues:  Diabetes:   Diet has worsened lately. Not as active so sits and eats. He continues to adjust his insulin and oral meds up and down on his own. Lab Results  Component Value Date   HGBA1C 8.1 (H) 01/31/2020   Using medications without difficulties: Hypoglycemic episodes:none Hyperglycemic episodes: yes after meals Feet problems: no ulcer Blood Sugars averaging: FBS 160 eye exam within last year: yes 11/2019 CKD  Wt Readings from Last 3 Encounters:  02/04/20 166 lb 4 oz (75.4 kg)  11/07/19 161 lb 12 oz (73.4 kg)  11/06/19 162 lb (73.5 kg)    This visit occurred during the SARS-CoV-2 public health emergency.  Safety protocols were in place, including screening questions prior to the visit,  additional usage of staff PPE, and extensive cleaning of exam room while observing appropriate contact time as indicated for disinfecting solutions.   COVID 19 screen:  No recent travel or known exposure to COVID19 The patient denies respiratory symptoms of COVID 19 at this time. The importance of social distancing was discussed today.     Review of Systems  Constitutional: Negative for chills and fever.  HENT: Negative for congestion and ear pain.   Eyes: Negative for pain and redness.  Respiratory: Positive for cough and shortness of breath. Negative for sputum production.   Cardiovascular: Negative for chest pain, palpitations and leg swelling.  Gastrointestinal: Negative for abdominal pain, blood in stool, constipation, diarrhea, nausea and vomiting.  Genitourinary: Negative for dysuria.  Musculoskeletal: Negative for falls and myalgias.  Skin: Negative for rash.  Neurological: Negative for dizziness.  Psychiatric/Behavioral: Negative for depression. The patient is not nervous/anxious.       Past Medical History:  Diagnosis Date  . Adenomatous colon polyp   . Allergy   . Arthritis   . Cataract   . Complication of anesthesia    Per pt, hard to wake up past last colon in 2011!  . Diabetes mellitus   . Esophageal stricture   . GERD (gastroesophageal reflux disease)   . History of colon polyps 922/2011  . Hyperlipidemia   . IBS (irritable bowel syndrome)   . Pulmonary fibrosis ([place])     reports that he has never smoked. He has never used smokeless tobacco. He reports that he does not drink alcohol or  use drugs.   Current Outpatient Medications:  .  ACCU-CHEK FASTCLIX LANCETS MISC, 1 Device by Other route 3 (three) times daily. Check blood sugars 2 to 3 times daily.  Dx: 250.02, Disp: 102 each, Rfl: 5 .  aspirin 81 MG tablet, Take 81 mg by mouth daily., Disp: , Rfl:  .  BD INSULIN SYRINGE U/F 31G X 5/16" 1 ML MISC, USE TO INJECT LANTUS INSULIN 2 TIMES DAILY, Disp: 180 each,  Rfl: 3 .  benzonatate (TESSALON) 100 MG capsule, TAKE 2 CAPSULES 3 TIMES DAILY, Disp: 180 capsule, Rfl: 3 .  Blood Glucose Monitoring Suppl (ACCU-CHEK NANO SMARTVIEW) W/DEVICE KIT, 1 kit by Other route 3 (three) times daily. Use to check blood sugars 2 to 3 times daily.  Dx: 250.02, Disp: 1 kit, Rfl: 0 .  COLCRYS 0.6 MG tablet, FOR FLARE TAKE 2 TABLETS ONCE THEN REPEAT 1 TABLET IN 2 HOURS IF PAINCONTINUES, THEN 1 TABLET DAILY, Disp: 60 tablet, Rfl: 0 .  ezetimibe (ZETIA) 10 MG tablet, TAKE 1 TABLET BY MOUTH DAILY, Disp: 90 tablet, Rfl: 3 .  fenofibrate 160 MG tablet, Take 1 tablet (160 mg total) by mouth daily., Disp: 90 tablet, Rfl: 1 .  glipiZIDE (GLUCOTROL) 10 MG tablet, TAKE ONE TABLET BY MOUTH TWICE A DAY BEFORE A MEAL, Disp: 60 tablet, Rfl: 11 .  glucose blood test strip, TEST BLOOD SUGAR TWO TO THREE TIMES DAILY, Disp: 300 each, Rfl: 3 .  insulin glargine (LANTUS) 100 UNIT/ML injection, Inject 70 Units into the skin daily., Disp: , Rfl:  .  Multiple Vitamin (MULTIVITAMIN) tablet, Take 1 tablet by mouth daily.  , Disp: , Rfl:  .  omeprazole (PRILOSEC) 20 MG capsule, TAKE 1 CAPSULE EVERY DAY, Disp: 90 capsule, Rfl: 3 .  Pirfenidone (ESBRIET) 267 MG TABS, Take 3 tablets (801 mg total) by mouth 3 (three) times daily with meals., Disp: 270 tablet, Rfl: 11 .  simvastatin (ZOCOR) 40 MG tablet, TAKE 1 TABLET AT BEDTIME, Disp: 90 tablet, Rfl: 0 .  tamsulosin (FLOMAX) 0.4 MG CAPS capsule, TAKE 1 CAPSULE BY MOUTH DAILY. IF NOT IMPROVING, INCREASE TO 2 TABLETS DAILY, Disp: 60 capsule, Rfl: 5 .  Tiotropium Bromide-Olodaterol (STIOLTO RESPIMAT) 2.5-2.5 MCG/ACT AERS, Inhale 2 puffs into the lungs daily., Disp: 4 g, Rfl: 6   Observations/Objective: Blood pressure 130/64, pulse 80, temperature 98 F (36.7 C), resp. rate 16, height 5' 6.25" (1.683 m), weight 166 lb 4 oz (75.4 kg), SpO2 96 %.  Physical Exam Constitutional:      General: He is not in acute distress.    Appearance: Normal appearance. He is  well-developed. He is not ill-appearing or toxic-appearing.  HENT:     Head: Normocephalic and atraumatic.     Right Ear: Hearing, tympanic membrane, ear canal and external ear normal.     Left Ear: Hearing, tympanic membrane, ear canal and external ear normal.     Nose: Nose normal.     Mouth/Throat:     Pharynx: Uvula midline.  Eyes:     General: Lids are normal. Lids are everted, no foreign bodies appreciated.     Conjunctiva/sclera: Conjunctivae normal.     Pupils: Pupils are equal, round, and reactive to light.  Neck:     Thyroid: No thyroid mass or thyromegaly.     Vascular: No carotid bruit.     Trachea: Trachea and phonation normal.  Cardiovascular:     Rate and Rhythm: Normal rate and regular rhythm.     Pulses: Normal  pulses.     Heart sounds: S1 normal and S2 normal. No murmur. No gallop.   Pulmonary:     Breath sounds: Rales present. No wheezing or rhonchi.  Abdominal:     General: Bowel sounds are normal.     Palpations: Abdomen is soft.     Tenderness: There is no abdominal tenderness. There is no guarding or rebound.     Hernia: No hernia is present.  Musculoskeletal:     Cervical back: Normal range of motion and neck supple.  Lymphadenopathy:     Cervical: No cervical adenopathy.  Skin:    General: Skin is warm and dry.     Findings: No rash.  Neurological:     Mental Status: He is alert.     Cranial Nerves: No cranial nerve deficit.     Sensory: No sensory deficit.     Gait: Gait normal.     Deep Tendon Reflexes: Reflexes are normal and symmetric.  Psychiatric:        Speech: Speech normal.        Behavior: Behavior normal.        Judgment: Judgment normal.      Assessment and Plan The patient's preventative maintenance and recommended screening tests for an annual wellness exam were reviewed in full today. Brought up to date unless services declined.  Counselled on the importance of diet, exercise, and its role in overall health and mortality. The  patient's FH and SH was reviewed, including their home life, tobacco status, and drug and alcohol status.    Vaccines: Uptodate  Colonoscopy: 2020 [place]   PSA not indicated.   ESSENTIAL HYPERTENSION, BENIGN Well controlled. Continue current medication.   HYPERCHOLESTEROLEMIA  On zetia and statin.[name] almost at goal.  Type 2 diabetes mellitus with diabetic neuropathy, unspecified ([place]) Worsened control.. pt taking medication off and on. Encouraged again to take medication regularly.  Goal A1C is not as tight given other comorbidities.  CKD (chronic kidney disease) stage 2, GFR 60-89 ml/min Stable    [name], MD

## 2020-02-04 NOTE — Assessment & Plan Note (Signed)
Stable

## 2020-02-04 NOTE — Patient Instructions (Signed)
Try to take Lantus every day.  Work on walking and low carb diet.

## 2020-02-04 NOTE — Assessment & Plan Note (Signed)
Well controlled. Continue current medication.  

## 2020-02-04 NOTE — Assessment & Plan Note (Addendum)
Worsened control.. pt taking medication off and on. Encouraged again to take medication regularly.  Goal A1C is not as tight given other comorbidities.

## 2020-02-05 ENCOUNTER — Ambulatory Visit
Admission: RE | Admit: 2020-02-05 | Discharge: 2020-02-05 | Disposition: A | Discharge status: Home / Self Care | Payer: Medicare HMO | Source: Ambulatory Visit | Attending: Pulmonary Disease | Admitting: Pulmonary Disease

## 2020-02-05 DIAGNOSIS — R0602 Shortness of breath: Secondary | ICD-10-CM | POA: Diagnosis not present

## 2020-02-05 DIAGNOSIS — E785 Hyperlipidemia, unspecified: Secondary | ICD-10-CM | POA: Diagnosis not present

## 2020-02-05 DIAGNOSIS — J8410 Pulmonary fibrosis, unspecified: Secondary | ICD-10-CM | POA: Insufficient documentation

## 2020-02-05 DIAGNOSIS — Z951 Presence of aortocoronary bypass graft: Secondary | ICD-10-CM | POA: Insufficient documentation

## 2020-02-05 DIAGNOSIS — I700 Atherosclerosis of aorta: Secondary | ICD-10-CM | POA: Insufficient documentation

## 2020-02-05 DIAGNOSIS — J849 Interstitial pulmonary disease, unspecified: Secondary | ICD-10-CM

## 2020-02-05 DIAGNOSIS — E119 Type 2 diabetes mellitus without complications: Secondary | ICD-10-CM | POA: Insufficient documentation

## 2020-02-05 DIAGNOSIS — R0600 Dyspnea, unspecified: Secondary | ICD-10-CM | POA: Diagnosis not present

## 2020-02-05 DIAGNOSIS — I358 Other nonrheumatic aortic valve disorders: Secondary | ICD-10-CM | POA: Diagnosis not present

## 2020-02-05 NOTE — Progress Notes (Signed)
*  PRELIMINARY RESULTS* Echocardiogram 2D Echocardiogram has been performed.  Brian Bass 02/05/2020, 11:09 AM

## 2020-02-06 ENCOUNTER — Other Ambulatory Visit: Payer: Self-pay

## 2020-02-06 ENCOUNTER — Telehealth: Payer: Self-pay

## 2020-02-06 NOTE — Telephone Encounter (Signed)
Received a fax from [place] at [employer] today for patient assistance with [employer] I called and let her know that pt has an upcoming appt on 3/11 and I'd like to wait and see if [employer] will want pt to continue. We will followup on 3/11. I will leave encounter open until then.

## 2020-02-06 NOTE — Patient Outreach (Signed)
[facility]) Care Management  02/06/2020  QUANTA ROHER [birth year] [id number]   Telephone Assessment    Outreach attempt #5 to patient. No answer at present.      Plan: RN CM will make outreach attempt to patient within the month of May.   Enzo Montgomery, RN,BSN,CCM [place] Management Telephonic Care Management Coordinator Direct Phone: [phone] Toll Free: [phone] Fax: [phone]

## 2020-02-10 ENCOUNTER — Ambulatory Visit: Payer: Self-pay

## 2020-02-13 ENCOUNTER — Ambulatory Visit: Payer: Medicare HMO | Admitting: Pulmonary Disease

## 2020-02-13 ENCOUNTER — Telehealth: Payer: Self-pay | Admitting: Pulmonary Disease

## 2020-02-13 ENCOUNTER — Encounter: Payer: Self-pay | Admitting: Pulmonary Disease

## 2020-02-13 ENCOUNTER — Other Ambulatory Visit: Payer: Self-pay

## 2020-02-13 VITALS — BP 144/80 | HR 85 | Temp 98.0°F | Ht 66.0 in | Wt 168.0 lb

## 2020-02-13 DIAGNOSIS — J9611 Chronic respiratory failure with hypoxia: Secondary | ICD-10-CM

## 2020-02-13 DIAGNOSIS — R0600 Dyspnea, unspecified: Secondary | ICD-10-CM

## 2020-02-13 DIAGNOSIS — R0609 Other forms of dyspnea: Secondary | ICD-10-CM

## 2020-02-13 DIAGNOSIS — J849 Interstitial pulmonary disease, unspecified: Secondary | ICD-10-CM | POA: Diagnosis not present

## 2020-02-13 NOTE — Telephone Encounter (Signed)
Pt seen in office on 02/13/2020 and instructed to continue [place]. Forms have been placed in Dr. Domingo Dimes folder for signature.

## 2020-02-13 NOTE — Telephone Encounter (Signed)
Forms have been started for ofev and placed in Dr. Domingo Dimes folder for signature.

## 2020-02-13 NOTE — Telephone Encounter (Signed)
Noted we will get them signed

## 2020-02-13 NOTE — Progress Notes (Addendum)
Subjective:    Patient ID: Brian Bass, male    DOB: 1941/08/13, 79 y.o.   MRN: [id number]  HPI Brian Bass is a 79 year old lifelong never smoker with a history of pulmonary fibrosis (IPF) whom I first evaluated 06 November 2019.  He previously followed with Dr. Ashby Dawes.  At that time of my initial evaluation of the patient he had been on Esbriet however was only able to tolerate 2 tablets 3 times daily due to undesirable and limiting GI side effects.  The patient today presents for follow-up and actually admits that he has only been taking 3 tablets a day which is significantly lower than the recommended dosage of 3 tablets 3 times daily.  He has increasing shortness compared to his prior visit.  Seems to have a dry cough particularly after meals is somewhat relieved with Tessalon Perles.  During his previous visit we also placed him on Stiolto to see if this would control his cough as well.  He notes that this is helpful.  He has not had any fevers, chills or sweats.  No orthopnea or paroxysmal nocturnal dyspnea.  He does have issues with gastroesophageal reflux however states that this is well controlled with omeprazole.  He is compliant with omeprazole and antireflux measures.  DATA:  Serology 02/27/18>>ACE, rheumatoid factor, ANA negative.  PFT tracings 3/26/19FVC is 63% predicted, FEV1 of 70% predicted, ratio 86% predicted. There is no significant change with bronchodilator therapy. Flow volume loop is consistent with restriction.DLCO is 54% predicted, DLCO is reduced at 56%.  CT high-resolution 02/27/18>>the bibasilar fibrotic changes have advanced significantly in the subpleural areas of traction bronchiectasis changes have worsened. Right paratracheal lymphadenopathy minimally changed.  Chest x-ray 02/08/18;diffuse groundglass and interstitial changes throughout both lungs, right diaphragm is elevated. Interstitial changes has progressed significantly since previous chest  x-ray on 09/06/13. CT chest 10/21/13, bilateral emphysematous changes, bilateral subpleural reticular changes consistent with pulmonary fibrosis.   Chemistry 01/23/2019>>AST, ALT normal.  Overnight oximetry 10/08/2018>>performed on room air; time less than or equal to 88% was 0.7 minutes. Oxygen was not required.  ONOX 11/13/2019 no significant desaturations though cyclical desats to 34% were noted.  CT chest high resolution 02/05/2020 persistent severe fibrotic changes unchanged from 3/19 however significantly progressed from 2014.  2D echo 02/05/2020 LVEF 60 to 65% moderate pulmonary hypertension.   Review of Systems A 10 point review of systems was performed and it is as noted above otherwise negative.    Objective:   Physical Exam BP (!) 144/80 (BP Location: Left Arm, Cuff Size: Normal)   Pulse 85   Temp 98 F (36.7 C) (Temporal)   Ht _0  (1.676 m)   Wt 168 lb (76.2 kg)   SpO2 96%   BMI 27.12 kg/m  GENERAL: Awake, alert, no respiratory distress.  Mild conversational dyspnea.  Fully ambulatory HEAD: Normocephalic, atraumatic.  EYES: Pupils equal, round, reactive to light.  No scleral icterus.  MOUTH: Nose/mouth/throat not examined due to masking requirements for COVID 19. NECK: Supple. No thyromegaly. No nodules. No JVD.  Trachea midline. PULMONARY: Velcro crackles at the lower lung zones. CARDIOVASCULAR: S1 and S2. Regular rate and rhythm.  Grade 2/6 systolic ejection murmur left sternal border.  No gallop. GASTROINTESTINAL: Abdomen nondistended, soft. MUSCULOSKELETAL: No joint swelling, mild clubbing of fingers, no edema.  NEUROLOGIC: No focal deficits noted.  Coherent.  Fluent speech.  Awake, alert oriented. SKIN: Intact,warm,dry.  Limited exam shows no rashes or lesions. PSYCH: Mood and behavior are appropriate.  Representative slice from CT scan of the chest, high-resolution 02/05/2020, fibrotic changes with honeycombing particularly at the bases, traction  bronchiectasis and bullous changes noted.  Independently reviewed  Ambulatory oximetry performed today: Patient desaturated to 86 to 88% with less than 75 feet ambulation.  Patient required 2 L/min to maintain oxygen saturations at 92% with ambulation.  Assessment & Plan:  Interstitial lung disease UIP/IPF Worsening dyspnea on exertion By CT chest the process is stable Have not been able to perform PFTs due to COVID-19 restrictions PFT as soon as feasible Not tolerating Esbriet and on inadequate dose  Will transition to Ofev 150 mg twice daily with meals Recent hepatic panel 01/31/2020 normal Will need frequent monitoring of hepatic function on Ofev Pulmonary hypertension developing and may be contributing Ambulatory oximetry today shows desaturations with exercise  Chronic respiratory failure with hypoxia Moderate pulmonary hypertension Associated with IPF Chronic hypoxic vasoconstriction Exuberant fibroproliferative process Supplemental O2 at 2 L/min May benefit from sildenafil in the future Adds to guarded prognosis  We will see the patient in follow-up in 4 to 6 weeks time.  Renold Don, MD [facility]  *This note was dictated using voice recognition software/Dragon.  Despite best efforts to proofread, errors can occur which can change the meaning.  Any change was purely unintentional.

## 2020-02-13 NOTE — Patient Instructions (Signed)
We are going to change her Esbriet to Ofev.  This will be 1 capsule twice a day.  You have qualified for oxygen and will need oxygen at 2 L/min continuously.  We will set you up for that  I would like to see you back and 4 to 6 weeks time.

## 2020-02-14 ENCOUNTER — Telehealth: Payer: Self-pay | Admitting: Pulmonary Disease

## 2020-02-14 NOTE — Telephone Encounter (Signed)
Spoke with Melissa with [employer] and she stated that pt's wife called into the Call Center and was advised incorrectly about [employer] being able to order the [employer] of which they do not carry.  Spoke with Wells Guiles with [employer] and they do not have any POC that are continuous flow devices and [employer] is not in network with [employer].   Mrs. Blouch wants to proceed with Evaluation of POC with [employer] and I have LMOVM for Melissa with [employer] to proceed to arrange Evaluation at the [facility] for [place]. Advised pt's wife if she had any other concerns to please let us know. Nothing else needed at this time. Rhonda J Cobb

## 2020-02-14 NOTE — Telephone Encounter (Signed)
ATC, no answer. LMOVM for pt's wife (dpr) to return call to my direct phone number [phone]. Rhonda J Cobb

## 2020-02-14 NOTE — Telephone Encounter (Signed)
Contacted Melissa with [employer] and she will contact me back to advise.  [name] is aware. Rhonda J Cobb

## 2020-02-14 NOTE — Telephone Encounter (Signed)
Forms have been completed and faxed to [facility] patient assistance and [employer]. Will await determination.

## 2020-02-14 NOTE — Telephone Encounter (Signed)
Form has been completed and faxed back to [facility].  Nothing further is needed.

## 2020-02-14 NOTE — Telephone Encounter (Signed)
Spoke to pt's spouse, [name](DPR).  Brian Bass had additional questions regarding oxygen. Brian Bass stated that she received a call from [employer] and she expressed interest in [place]. Per Brian Bass, [employer] stated that an order was needed. I have made Brian Bass aware that order was placed to [employer] on 02/13/2020.  [name], can you help with this? Is something additional needed?

## 2020-02-14 NOTE — Telephone Encounter (Signed)
PA for ofev has been approved until 12/04/2020. Larene Beach with [employer] is aware of approval and voiced her understanding. Nothing further is need at this time.

## 2020-02-14 NOTE — Telephone Encounter (Signed)
Lm for pt's spouse, [name](DPR).

## 2020-02-14 NOTE — Telephone Encounter (Signed)
Please see 02/13/2020 phone note.

## 2020-02-14 NOTE — Telephone Encounter (Signed)
Spoke to [place] with [employer], who states that PA for Ofev 160m is needed. PA has been started via cover my meds.   KBEM:[id number]

## 2020-02-17 ENCOUNTER — Other Ambulatory Visit: Payer: Self-pay | Admitting: Pharmacy Technician

## 2020-02-17 DIAGNOSIS — J849 Interstitial pulmonary disease, unspecified: Secondary | ICD-10-CM | POA: Diagnosis not present

## 2020-02-17 NOTE — Patient Outreach (Signed)
[facility]) Care Management  02/17/2020  Brian Bass January 25, 1941 [id number]   Received provider portion(s) of patient assistance application(s) for Stiolto. Faxed completed application and required documents into [employer].  Will follow up with company(ies) in 10-14 business days to check status of application(s).  Maud Deed Chana Bode Strawn Certified Pharmacy Technician [facility] Direct Dial:[phone]

## 2020-02-18 ENCOUNTER — Telehealth: Payer: Self-pay | Admitting: Pulmonary Disease

## 2020-02-18 DIAGNOSIS — J849 Interstitial pulmonary disease, unspecified: Secondary | ICD-10-CM

## 2020-02-18 NOTE — Telephone Encounter (Signed)
LMTCB x1

## 2020-02-18 NOTE — Telephone Encounter (Signed)
I called and spoke with [name] and made her aware that [employer] does not have any POC units right now and she states that she found a company in [place] called [place]. This POC units is [employer] brand. She is requesting the order be faxed to her as well so that she can get some reimbursement for it through her insurance. The fax number for her is [phone] attn: Belenda Cruise. [name]

## 2020-02-19 ENCOUNTER — Telehealth: Payer: Self-pay | Admitting: Pulmonary Disease

## 2020-02-19 NOTE — Telephone Encounter (Signed)
Called and advised patient's daughter that order has been sent to [place] and that I was also sending her the order as requested. Fax number verified with daughter. Confirmation received that order was successfully received by [facility] [place] and to [place]. Also advised daughter that if they file this POC order with pt's insurance that it may cause [employer] to be unable to supply patient with 02 equipment at his home due to insurance due to insurance not paying two companies to supply 02. Rhonda J Cobb

## 2020-02-19 NOTE — Telephone Encounter (Signed)
POC order sent to [place] per request. Confirmation received. Nothing else needed at this time. Rhonda J Cobb

## 2020-02-19 NOTE — Telephone Encounter (Signed)
Will do!

## 2020-02-19 NOTE — Telephone Encounter (Signed)
LMOVM for pt's wife to return my call to explain the insurance issue with ordering a POC and having service with [employer]. Rhonda J Cobb

## 2020-02-19 NOTE — Telephone Encounter (Signed)
The o2 order for the DME she wants in already in place, just waiting on signature from Dr Patsey Berthold  I spoke with the pt's daughter and notified her of this  Dr Darnell Level, can you please sign off on the order for o2 so that [name] can fax this, thanks!

## 2020-02-20 ENCOUNTER — Telehealth: Payer: Self-pay | Admitting: Pulmonary Disease

## 2020-02-20 NOTE — Telephone Encounter (Signed)
Patient filled out paperwork for [facility] patient assistance and it was faxed.   Once the approval for Patient assistance is made, the company will call the patient   Made contact with [name] our Ofev drug rep she gave me a direct number to call for patient to follow up on patient assistance [phone].  Patient's wife asked about gabapentin, Dr. Patsey Berthold wants to try to Ofev first   Called spoke with [name] to let her know Dr.Gonzalez recommendations first. Also the stiolto inhaler will help with cough   When on phone with patient's wife she states she called the number aboce and she was told that they needed proof of income. Patient will fax [name] the income proof tomorrow 02/21/20 then we can refax the information to [facility] cares.

## 2020-02-20 NOTE — Telephone Encounter (Signed)
Brian Bass is calling to check on status of financial assistance for Brian Bass. [name] stated that pt's daughter completed the forms and pt signed them and they were mailed and she hasn't heard anything about if he was approved.  Pt has since received call from [facility] stating that this medication would cost them $2,000 a month.    Brian Bass would like to know if anyone knows if patient has been approved for assistance or who she could call to find out.  Please advise. Brian Bass

## 2020-02-24 ENCOUNTER — Other Ambulatory Visit: Payer: Self-pay | Admitting: Pharmacist

## 2020-02-24 NOTE — Patient Outreach (Signed)
[facility] ([facility])  [facility] Quality Pharmacy Team    [facility] pharmacy case will be closed as our team is transitioning from the [facility] Care Management Department into the [facility] Quality Department and will no longer be using [facility] for documentation purposes.   [facility] pharmacy technician will continue to assist patient with medication assistance program applications until complete.     Leiah Giannotti, PharmD, BCPS Clinical Pharmacist [facility] [phone]      

## 2020-02-24 NOTE — Telephone Encounter (Signed)
Received proof of income and faxed to [facility] cares at [phone]. Spoke with the pt's spouse and notified her that this was done.

## 2020-03-11 ENCOUNTER — Telehealth: Payer: Self-pay | Admitting: Pulmonary Disease

## 2020-03-12 NOTE — Telephone Encounter (Signed)
Called and spoke with Aaron Edelman at [facility] cares patient assistance at [phone] and was told that the patient wrote the wrong date on the application. Aaron Edelman stated that he was going to fax it over to the office as well as mail it to the patient. He said that the patient wrote the wrong date on it and it had to be fixed by the patient before we can move the process along. Called and spoke with [name] and informed her about what is going on. Told her that once we got the application and it had the right date on it and had faxed it back at [phone] we would let her know. She expressed understanding.

## 2020-03-12 NOTE — Telephone Encounter (Signed)
[name], Do you have this paperwork that was faxed over for patient assistance? The date needs to be changed and faxed back to [phone].  Case Number is [id number]  Please notify wife Brian Bass once it has been fixed and faxed over.

## 2020-03-12 NOTE — Telephone Encounter (Signed)
[name] calling back regarding financial assistance form that needed corrected. States that his med is being held up. Please advise.

## 2020-03-13 ENCOUNTER — Telehealth: Payer: Self-pay | Admitting: Pulmonary Disease

## 2020-03-13 NOTE — Telephone Encounter (Signed)
Spoke with wife she is going to come by on Tuesday next week and sign new papers for this Ofev. Form handed to Dr. Darnell Level to fill out to have ready for when patient signs Tuesday we can fax it back to the appropriate place .

## 2020-03-16 NOTE — Telephone Encounter (Signed)
I called and spoke with the pharmacy and they state that they have not been able to get in contact with the patient. I verified both phone numbers and they have the correct ones. She states that they need to speak with the patient to verify shipment. She states that they will cancel the order on 03/20/20 if they do not hear from patient. I also provided her with the patient's spouse's cell phone number that was in the chart to see if this will help.  I will route this to Dr. Darnell Level so that she is aware.

## 2020-03-16 NOTE — Telephone Encounter (Signed)
Process is done. OFEV has been approved and pharmacy is awaiting to hear back from patient so that they can ship the medication. This was documented in another telephone note as well.

## 2020-03-16 NOTE — Telephone Encounter (Signed)
Thank you.

## 2020-03-17 ENCOUNTER — Telehealth: Payer: Self-pay | Admitting: Pulmonary Disease

## 2020-03-17 NOTE — Telephone Encounter (Signed)
Patient's wife came to office today to refill out application due to the date being wrong. Application has been filled out, signed by Dr.Gonzalez and faxed to [facility] at [phone]. Wife has also been given the number [phone] to call and talk to them. There are notes stating that they have tried several times to get ahold of patient with no success so I gave her the number.  Nothing further needed at this time.

## 2020-03-18 ENCOUNTER — Telehealth: Payer: Self-pay | Admitting: Pulmonary Disease

## 2020-03-18 NOTE — Telephone Encounter (Signed)
Medication name and strength: OFEV  PA approved/denied: Approved

## 2020-03-19 ENCOUNTER — Telehealth: Payer: Self-pay | Admitting: Pulmonary Disease

## 2020-03-19 DIAGNOSIS — J849 Interstitial pulmonary disease, unspecified: Secondary | ICD-10-CM | POA: Diagnosis not present

## 2020-03-19 NOTE — Telephone Encounter (Signed)
Spoke with Sam at [facility]. He states that they were making sure that the pt would not be taking Esbriet along with OFEV. Advised Sam that OFEV will be taking the place of Esbriet. Nothing further needed.

## 2020-03-26 ENCOUNTER — Other Ambulatory Visit: Payer: Self-pay | Admitting: Family Medicine

## 2020-03-26 NOTE — Telephone Encounter (Signed)
Last office visit 02/04/2020 for CPE.  Zofran not on current medication list.  Next Appt: 08/06/2020 for 6 month follow up.  Refill?

## 2020-04-01 ENCOUNTER — Other Ambulatory Visit: Payer: Self-pay

## 2020-04-01 ENCOUNTER — Encounter: Payer: Self-pay | Admitting: Pulmonary Disease

## 2020-04-01 ENCOUNTER — Ambulatory Visit: Payer: Medicare HMO | Admitting: Pulmonary Disease

## 2020-04-01 VITALS — BP 110/60 | HR 80 | Ht 67.0 in | Wt 166.4 lb

## 2020-04-01 DIAGNOSIS — R0600 Dyspnea, unspecified: Secondary | ICD-10-CM | POA: Diagnosis not present

## 2020-04-01 DIAGNOSIS — Z5181 Encounter for therapeutic drug level monitoring: Secondary | ICD-10-CM | POA: Diagnosis not present

## 2020-04-01 DIAGNOSIS — J84112 Idiopathic pulmonary fibrosis: Secondary | ICD-10-CM | POA: Diagnosis not present

## 2020-04-01 DIAGNOSIS — R0609 Other forms of dyspnea: Secondary | ICD-10-CM

## 2020-04-01 DIAGNOSIS — R197 Diarrhea, unspecified: Secondary | ICD-10-CM

## 2020-04-01 DIAGNOSIS — J9611 Chronic respiratory failure with hypoxia: Secondary | ICD-10-CM | POA: Diagnosis not present

## 2020-04-01 MED ORDER — CHOLESTYRAMINE LIGHT 4 G PO PACK: 4.0000 g | PACK | Freq: Two times a day (BID) | ORAL | 3 refills | Status: DC | PRN

## 2020-04-01 NOTE — Patient Instructions (Signed)
We have given you a prescription for cholestyramine, this is a powder that you can take up to twice a day as needed for diarrhea you can mix it on fluid of choice  When you charge your concentrator batteries make sure that you take them out of the machine when you are not using the machine this will prevent the batteries from discharging completely  We will see you in follow-up in 4 to 6 weeks time call sooner should any new difficulties arise.  Prior to your visit you should go and have some blood work done we will place the order for that (CBC and CMET)

## 2020-04-01 NOTE — Progress Notes (Signed)
Subjective:    Patient ID: Brian Bass, male    DOB: 05-12-1941, 79 y.o.   MRN: [id number]  HPI Brian Bass is a 79 year old lifelong never smoker with a history of pulmonary fibrosis (IPF) whom I first evaluated 06 November 2019.  He previously followed with Dr. Ashby Dawes.  At that time of my initial evaluation of the patient he had been on Esbriet however was only able to tolerate 3 tablets daily due to undesirable and limiting GI side effects.   This is significantly lower than the recommended dosage of 3 tablets 3 times daily.  At his prior visit we switched him to Ofev 150 mg twice daily.  His only issue is some mild loose stools and occasional diarrhea with the medication.  He is willing however to continue to try it as he has no other untoward side effect.  He continues to have a dry cough particularly after meals is somewhat relieved with Tessalon Perles.  At a prior visit we also placed him on Stiolto to see if this would control his cough as well.  He notes that this is helpful.  He has not had any fevers, chills or sweats.  No orthopnea or paroxysmal nocturnal dyspnea.  He does have issues with gastroesophageal reflux however states that this is well controlled with omeprazole.  He is compliant with omeprazole and antireflux measures.  DATA:  Serology 02/27/18>>ACE, rheumatoid factor, ANA negative.  PFT tracings 3/26/19FVC is 63% predicted, FEV1 of 70% predicted, ratio 86% predicted. There is no significant change with bronchodilator therapy. Flow volume loop is consistent with restriction.DLCO is 54% predicted, DLCO is reduced at 56%.  CT high-resolution 02/27/18>>the bibasilar fibrotic changes have advanced significantly in the subpleural areas of traction bronchiectasis changes have worsened. Right paratracheal lymphadenopathy minimally changed.  Chest x-ray 02/08/18;diffuse groundglass and interstitial changes throughout both lungs, right diaphragm is elevated.  Interstitial changes has progressed significantly since previous chest x-ray on 09/06/13. CT chest 10/21/13, bilateral emphysematous changes, bilateral subpleural reticular changes consistent with pulmonary fibrosis.   Chemistry 01/23/2019>>AST, ALT normal.  Overnight oximetry 10/08/2018>>performed on room air; time less than or equal to 88% was 0.7 minutes. Oxygen was not required.  ONOX 11/13/2019 no significant desaturations though cyclical desats to 56% were noted.  CT chest high resolution 02/05/2020 persistent severe fibrotic changes unchanged from 3/19 however significantly progressed from 2014.  2D echo 02/05/2020 LVEF 60 to 65% moderate pulmonary hypertension.   Review of Systems  A 10 point review of systems was performed and it is as noted above otherwise negative.   Immunization History  Administered Date(s) Administered  . Fluad Quad(high Dose 65+) 08/08/2019  . Influenza Split 10/05/2011, 10/11/2012  . Influenza Whole 11/12/2010  . Influenza,inj,Quad PF,6+ Mos 09/06/2013, 09/24/2014, 10/02/2015, 09/13/2016, 09/29/2017, 08/24/2018  . PFIZER SARS-COV-2 Vaccination 01/22/2020, 02/12/2020  . Pneumococcal Conjugate-13 01/12/2018  . Pneumococcal Polysaccharide-23 12/06/2007  . Td 08/10/2010     Current Meds  Medication Sig  . ACCU-CHEK FASTCLIX LANCETS MISC 1 Device by Other route 3 (three) times daily. Check blood sugars 2 to 3 times daily.  Dx: 250.02  . aspirin 81 MG tablet Take 81 mg by mouth daily.  . BD INSULIN SYRINGE U/F 31G X 5/16" 1 ML MISC USE TO INJECT LANTUS INSULIN 2 TIMES DAILY  . benzonatate (TESSALON) 100 MG capsule TAKE 2 CAPSULES 3 TIMES DAILY  . Blood Glucose Monitoring Suppl (ACCU-CHEK NANO SMARTVIEW) W/DEVICE KIT 1 kit by Other route 3 (three) times daily. Use to  check blood sugars 2 to 3 times daily.  Dx: 250.02  . COLCRYS 0.6 MG tablet FOR FLARE TAKE 2 TABLETS ONCE THEN REPEAT 1 TABLET IN 2 HOURS IF PAINCONTINUES, THEN 1 TABLET DAILY  . ezetimibe  (ZETIA) 10 MG tablet TAKE 1 TABLET BY MOUTH DAILY  . fenofibrate 160 MG tablet Take 1 tablet (160 mg total) by mouth daily.  [name] glipiZIDE (GLUCOTROL) 10 MG tablet TAKE ONE TABLET BY MOUTH TWICE A DAY BEFORE A MEAL  . glucose blood test strip TEST BLOOD SUGAR TWO TO THREE TIMES DAILY  . insulin glargine (LANTUS) 100 UNIT/ML injection Inject 70 Units into the skin daily.  . Multiple Vitamin (MULTIVITAMIN) tablet Take 1 tablet by mouth daily.    . Nintedanib (OFEV) 150 MG CAPS Take 150 mg by mouth 2 (two) times daily.  [name] omeprazole (PRILOSEC) 20 MG capsule TAKE 1 CAPSULE EVERY DAY  . ondansetron (ZOFRAN-ODT) 4 MG disintegrating tablet TAKE ONE TABLET EVERY EIGHT HOURS AS NEEDED FOR NAUSEA / VOMITING  . simvastatin (ZOCOR) 40 MG tablet TAKE 1 TABLET AT BEDTIME  . tamsulosin (FLOMAX) 0.4 MG CAPS capsule TAKE 1 CAPSULE BY MOUTH DAILY. IF NOT IMPROVING, INCREASE TO 2 TABLETS DAILY  . Tiotropium Bromide-Olodaterol (STIOLTO RESPIMAT) 2.5-2.5 MCG/ACT AERS Inhale 2 puffs into the lungs daily.    Patient Active Problem List   Diagnosis Date Noted  . Atherosclerosis of native coronary artery of native heart with stable angina pectoris (Brian Bass) 03/23/2019  . Elevated PSA 01/29/2019  . Diabetic polyneuropathy associated with type 2 diabetes mellitus (Brian Bass) 01/29/2019  . Positive cardiac stress test 03/06/2018  . DOE (dyspnea on exertion) 02/08/2018  . Benign prostatic hyperplasia with nocturia 02/08/2018  . Counseling regarding end of life decision making 07/14/2015  . GERD (gastroesophageal reflux disease) 12/13/2013  . Diffuse Parynchymal Lung Disease 11/08/2013  . CKD (chronic kidney disease) stage 2, GFR 60-89 ml/min 09/06/2013  . Idiopathic gout of multiple sites 12/03/2009  . CAD (coronary artery disease) 11/02/2009  . Type 2 diabetes mellitus with diabetic neuropathy, with long-term current use of insulin (Brian Bass) 08/26/2009  . Hyperlipidemia associated with type 2 diabetes mellitus (Brian Bass) 08/26/2009  .  Hypertension associated with diabetes (Brian Bass) 08/26/2009  . OSTEOARTHRITIS, MULTIPLE JOINTS 08/26/2009  . ESOPHAGEAL STRICTURE 10/14/2008  . IRRITABLE BOWEL SYNDROME 09/02/2008  . PERSONAL HX COLONIC POLYPS 09/02/2008       Objective:   Physical Exam BP 110/60 (BP Location: Left Arm, Cuff Size: Normal)   Pulse 80   Ht 5' 7" (1.702 m)   Wt 166 lb 6.4 oz (75.5 kg)   SpO2 97%   BMI 26.06 kg/m  GENERAL:Awake, alert, no respiratory distress. Mild conversational dyspnea. Fully ambulatory HEAD: Normocephalic, atraumatic.  EYES: Pupils equal, round, reactive to light. No scleral icterus.  MOUTH: Nose/mouth/throat not examined due to masking requirements for COVID 19. NECK: Supple. No thyromegaly. No nodules. No JVD. Trachea midline. PULMONARY: Velcro crackles at the lower lung zones. CARDIOVASCULAR: S1 and S2. Regular rate and rhythm. Grade 2/6 systolic ejection murmur left sternal border. No gallop. GASTROINTESTINAL: Abdomen nondistended, soft. MUSCULOSKELETAL: Nojoint swelling, mild clubbing of fingers, no edema.  NEUROLOGIC: No focal deficits noted. Coherent.  Fluent speech.  Awake, alert oriented. SKIN: Intact,warm,dry. Limited exam shows no rashes or lesions. PSYCH: Mood and behavior are appropriate.    Assessment & Plan:     ICD-10-CM   1. IPF (idiopathic pulmonary fibrosis) (HCC)  J84.112    On Ofev Follow-up 4 to 6 weeks time  2. DOE (dyspnea on exertion)  R06.00    Continues to be an issue  Due to severe IPF  3. Chronic respiratory failure with hypoxia (HCC)  J96.11    Continue oxygen supplementation Instructed proper management of POC  4. Encounter for medication monitoring  Z51.81 Comprehensive metabolic panel    CBC with Differential/Platelet    CANCELED: CBC with Differential/Platelet    CANCELED: Comprehensive metabolic panel   CBC CMP Monitoring Ofev  5. Diarrhea, unspecified type  R19.7    Appears to be related to Ofev Mild Trial of short course  cholestyramine    Meds ordered this encounter  Medications  . cholestyramine light (PREVALITE) 4 g packet    Sig: Take 1 packet (4 g total) by mouth 2 (two) times daily as needed (Diarrhea).    Dispense:  60 each    Refill:  3   We will see the patient in follow-up in 4 to 6 weeks time he is to have CBC and c-Met prior to that time monitoring for Ofev.  Renold Don, MD  PCCM   *This note was dictated using voice recognition software/Dragon.  Despite best efforts to proofread, errors can occur which can change the meaning.  Any change was purely unintentional.

## 2020-04-10 ENCOUNTER — Other Ambulatory Visit: Payer: Self-pay

## 2020-04-10 ENCOUNTER — Encounter: Payer: Self-pay | Admitting: Internal Medicine

## 2020-04-10 ENCOUNTER — Ambulatory Visit (INDEPENDENT_AMBULATORY_CARE_PROVIDER_SITE_OTHER): Payer: Medicare HMO | Admitting: Internal Medicine

## 2020-04-10 VITALS — BP 126/74 | HR 84 | Temp 98.9°F | Wt 164.0 lb

## 2020-04-10 DIAGNOSIS — S61259A Open bite of unspecified finger without damage to nail, initial encounter: Secondary | ICD-10-CM

## 2020-04-10 DIAGNOSIS — Z23 Encounter for immunization: Secondary | ICD-10-CM | POA: Diagnosis not present

## 2020-04-10 DIAGNOSIS — W5501XA Bitten by cat, initial encounter: Secondary | ICD-10-CM

## 2020-04-10 DIAGNOSIS — S61452A Open bite of left hand, initial encounter: Secondary | ICD-10-CM | POA: Diagnosis not present

## 2020-04-10 MED ORDER — AMOXICILLIN-POT CLAVULANATE 875-125 MG PO TABS: 1.0000 | ORAL_TABLET | Freq: Two times a day (BID) | ORAL | 0 refills | Status: DC

## 2020-04-10 NOTE — Progress Notes (Signed)
Subjective:    Patient ID: Brian Bass, male    DOB: Jun 02, 1941, 79 y.o.   MRN: [id number]  HPI  Pt presents to the clinic today wit c/o a cat bite to the bite of his left hand. This occurred 1 week ago. It was not his cat and he is unaware of vaccine status. He has noticed some warmth and swelling. He has not noticed any discharge. He has been soaking his hand in alcohol. His last tetanus was 09/2010.  Review of Systems  Past Medical History:  Diagnosis Date  . Adenomatous colon polyp   . Allergy   . Arthritis   . Cataract   . Complication of anesthesia    Per pt, hard to wake up past last colon in 2011!  . Diabetes mellitus   . Esophageal stricture   . GERD (gastroesophageal reflux disease)   . History of colon polyps 922/2011  . Hyperlipidemia   . IBS (irritable bowel syndrome)   . Pulmonary fibrosis ([place])     Current Outpatient Medications  Medication Sig Dispense Refill  . ACCU-CHEK FASTCLIX LANCETS MISC 1 Device by Other route 3 (three) times daily. Check blood sugars 2 to 3 times daily.  Dx: 250.02 102 each 5  . aspirin 81 MG tablet Take 81 mg by mouth daily.    . BD INSULIN SYRINGE U/F 31G X 5/16" 1 ML MISC USE TO INJECT LANTUS INSULIN 2 TIMES DAILY 180 each 3  . benzonatate (TESSALON) 100 MG capsule TAKE 2 CAPSULES 3 TIMES DAILY 180 capsule 3  . Blood Glucose Monitoring Suppl (ACCU-CHEK NANO SMARTVIEW) W/DEVICE KIT 1 kit by Other route 3 (three) times daily. Use to check blood sugars 2 to 3 times daily.  Dx: 250.02 1 kit 0  . cholestyramine light (PREVALITE) 4 g packet Take 1 packet (4 g total) by mouth 2 (two) times daily as needed (Diarrhea). 60 each 3  . COLCRYS 0.6 MG tablet FOR FLARE TAKE 2 TABLETS ONCE THEN REPEAT 1 TABLET IN 2 HOURS IF PAINCONTINUES, THEN 1 TABLET DAILY 60 tablet 0  . ezetimibe (ZETIA) 10 MG tablet TAKE 1 TABLET BY MOUTH DAILY 90 tablet 3  . fenofibrate 160 MG tablet Take 1 tablet (160 mg total) by mouth daily. 90 tablet 1  . glipiZIDE  (GLUCOTROL) 10 MG tablet TAKE ONE TABLET BY MOUTH TWICE A DAY BEFORE A MEAL 60 tablet 11  . glucose blood test strip TEST BLOOD SUGAR TWO TO THREE TIMES DAILY 300 each 3  . insulin glargine (LANTUS) 100 UNIT/ML injection Inject 70 Units into the skin daily.    . Multiple Vitamin (MULTIVITAMIN) tablet Take 1 tablet by mouth daily.      . Nintedanib (OFEV) 150 MG CAPS Take 150 mg by mouth 2 (two) times daily.    [name] omeprazole (PRILOSEC) 20 MG capsule TAKE 1 CAPSULE EVERY DAY 90 capsule 3  . ondansetron (ZOFRAN-ODT) 4 MG disintegrating tablet TAKE ONE TABLET EVERY EIGHT HOURS AS NEEDED FOR NAUSEA / VOMITING 20 tablet 0  . simvastatin (ZOCOR) 40 MG tablet TAKE 1 TABLET AT BEDTIME 90 tablet 0  . tamsulosin (FLOMAX) 0.4 MG CAPS capsule TAKE 1 CAPSULE BY MOUTH DAILY. IF NOT IMPROVING, INCREASE TO 2 TABLETS DAILY 60 capsule 5  . Tiotropium Bromide-Olodaterol (STIOLTO RESPIMAT) 2.5-2.5 MCG/ACT AERS Inhale 2 puffs into the lungs daily. 4 g 6   No current facility-administered medications for this visit.    No Known Allergies  Family History  Problem Relation  Age of Onset  . Heart failure Mother   . Aneurysm Father   . Alcohol abuse Brother   . Colon cancer Cousin   . Esophageal cancer Neg Hx   . Rectal cancer Neg Hx   . Stomach cancer Neg Hx     Social History   Socioeconomic History  . Marital status: Married    Spouse name: Not on file  . Number of children: Not on file  . Years of education: Not on file  . Highest education level: Not on file  Occupational History  . 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 Smoking status: Never Smoker  . Smokeless tobacco: Never Used  Substance and Sexual Activity  . Alcohol use: No  . Drug use: No  . Sexual activity: Never  Other Topics Concern  . Not on file  Social History Narrative   Patient does not get regular exercise, but manual labor with farm work   Diet: fruits and veggies, fish, chicken, water        Social Determinants of Health   Financial Resource Strain: Low Risk   . Difficulty of Paying Living Expenses: Not hard at all  Food Insecurity: No Food Insecurity  . Worried About Charity fundraiser in the Last Year: Never true  . Ran Out of Food in the Last Year: Never true  Transportation Needs: No Transportation Needs  . Lack of Transportation (Medical): No  . Lack of Transportation (Non-Medical): No  Physical Activity: Sufficiently Active  . Days of Exercise per Week: 7 days  . Minutes of Exercise per Session: 30 min  Stress: No Stress Concern Present  . Feeling of Stress : Not at all  Social Connections:   . Frequency of Communication with Friends and Family:   . Frequency of Social Gatherings with Friends and Family:   . Attends Religious Services:   . Active Member of Clubs or Organizations:   . Attends Archivist Meetings:   [name] Marital Status:   Intimate Partner Violence: Not At Risk  . Fear of Current or Ex-Partner: No  . Emotionally Abused: No  . Physically Abused: No  . Sexually Abused: No     Constitutional: Denies fever, malaise, fatigue, headache or abrupt weight changes.  Respiratory: Denies difficulty breathing, shortness of breath, cough or sputum production.   Cardiovascular: Denies chest pain, chest tightness, palpitations or swelling in the hands or feet.  Musculoskeletal: Denies decrease in range of motion, difficulty with gait, muscle pain or joint pain and swelling.  Skin: Pt reports cat bite. Denies redness, rashes, lesions or ulcercations.   No other specific complaints in a complete review of systems (except as listed in HPI above).     Objective:   Physical Exam   BP 126/74   Pulse 84   Temp 98.9 F (37.2 C) (Temporal)   Wt 164 lb (74.4 kg)   SpO2 98%   BMI 25.69 kg/m   Wt Readings from Last 3 Encounters:  04/01/20 166 lb 6.4 oz (75.5 kg)  02/13/20 168 lb (76.2 kg)  02/04/20 166 lb 4 oz (75.4 kg)    General: Appears  his stated age, well developed, well nourished in NAD. Skin: Puncture sites noted over DIP and MCP left index finger. Redness noted from tip of left index finger to MTP. Cardiovascular: Normal rate. Radial pulse 2+ on the left. Pulmonary/Chest: Normal effort and positive vesicular breath sounds. No respiratory distress. No  wheezes, rales or ronchi noted.  Musculoskeletal: Normal flexion and extension of the fingers, left hand. Swelling noted of the left index finger. Neurological: Alert and oriented. Sensation intact to LUE.   BMET    Component Value Date/Time   NA 130 (L) 01/31/2020 0814   K 4.5 01/31/2020 0814   CL 97 01/31/2020 0814   CO2 27 01/31/2020 0814   GLUCOSE 183 (H) 01/31/2020 0814   BUN 15 01/31/2020 0814   CREATININE 1.15 01/31/2020 0814   CALCIUM 9.6 01/31/2020 0814   GFRNONAA 60 (L) 10/14/2018 0656   GFRAA >60 10/14/2018 0656    Lipid Panel     Component Value Date/Time   CHOL 143 01/31/2020 0814   TRIG 184.0 (H) 01/31/2020 0814   HDL 29.70 (L) 01/31/2020 0814   CHOLHDL 5 01/31/2020 0814   VLDL 36.8 01/31/2020 0814   LDLCALC 77 01/31/2020 0814    CBC    Component Value Date/Time   WBC 9.9 10/14/2018 0656   RBC 4.52 10/14/2018 0656   HGB 13.5 10/14/2018 0656   HCT 39.5 10/14/2018 0656   PLT 204 10/14/2018 0656   MCV 87.4 10/14/2018 0656   MCH 29.9 10/14/2018 0656   MCHC 34.2 10/14/2018 0656   RDW 12.4 10/14/2018 0656   LYMPHSABS 3.1 10/14/2018 0656   MONOABS 0.8 10/14/2018 0656   EOSABS 0.2 10/14/2018 0656   BASOSABS 0.0 10/14/2018 0656    Hgb A1C Lab Results  Component Value Date   HGBA1C 8.1 (H) 01/31/2020           Assessment & Plan:   Cat Bite of Left Hand:  Tetanus today RX for Augmentin 875-125 mg PO BID x 10 days Encouraged elevation  Return precautions discussed  Webb Silversmith, NP This visit occurred during the SARS-CoV-2 public health emergency.  Safety protocols were in place, including screening questions prior to the  visit, additional usage of staff PPE, and extensive cleaning of exam room while observing appropriate contact time as indicated for disinfecting solutions.

## 2020-04-10 NOTE — Patient Instructions (Signed)
Animal Bite, Adult Animal bites range from mild to serious. An animal bite can result in any of these injuries:  A scratch.  A deep, open cut.  A puncture of the skin.  A crush injury.  Tearing away of the skin or a body part.  A bone injury. A small bite from a house pet is usually less serious than a bite from a stray or wild animal, such as a raccoon, fox, skunk, or bat. That is because stray and wild animals have a higher risk of carrying a serious infection called rabies, which can be passed to humans through a bite. What increases the risk? You are more likely to be bitten by an animal if:  You are around unfamiliar pets.  You disturb an animal when it is eating, sleeping, or caring for its babies.  You are outdoors in a place where small, wild animals roam freely. What are the signs or symptoms? Common symptoms of an animal bite include:  Pain.  Bleeding.  Swelling.  Bruising. How is this diagnosed? This condition may be diagnosed based on a physical exam and medical history. Your health care provider will examine your wound and ask for details about the animal and how the bite happened. You may also have tests, such as:  Blood tests to check for infection.  X-rays to check for damage to bones or joints.  Taking a fluid sample from your wound and checking it for infection (culture test). How is this treated? Treatment varies depending on the type of animal, where the bite is on your body, and your medical history. Treatment may include:  Caring for the wound. This often includes cleaning the wound, rinsing out (flushing) the wound with saline solution, and applying a bandage (dressing). In some cases, the wound may be closed with stitches (sutures), staples, skin glue, or adhesive strips.  Antibiotic medicine to prevent or treat infection. This medicine may be prescribed in pill or ointment form. If the bite area becomes infected, the medicine may be given through  an IV.  A tetanus shot to prevent tetanus infection.  Rabies treatment to prevent rabies infection. This will be done if the animal could have rabies.  Surgery. This may be done if a bite gets infected or if there is damage that needs to be repaired. Follow these instructions at home: Wound care   Follow instructions from your health care provider about how to take care of your wound. Make sure you: ? Wash your hands with soap and water before you change your dressing. If soap and water are not available, use hand sanitizer. ? Change your dressing as told by your health care provider. ? Leave sutures, skin glue, or adhesive strips in place. These skin closures may need to stay in place for 2 weeks or longer. If adhesive strip edges start to loosen and curl up, you may trim the loose edges. Do not remove adhesive strips completely unless your health care provider tells you to do that.  Check your wound every day for signs of infection. Check for: ? More redness, swelling, or pain. ? More fluid or blood. ? Warmth. ? Pus or a bad smell. Medicines  Take or apply over-the-counter and prescription medicines only as told by your health care provider.  If you were prescribed an antibiotic, take or apply it as told by your health care provider. Do not stop using the antibiotic even if your condition improves. General instructions   Keep the injured  area raised (elevated) above the level of your heart while you are sitting or lying down, if this is possible.  If directed, put ice on the injured area. ? Put ice in a plastic bag. ? Place a towel between your skin and the bag. ? Leave the ice on for 20 minutes, 2-3 times per day.  Keep all follow-up visits as told by your health care provider. This is important. Contact a health care provider if:  You have more redness, swelling, or pain around your wound.  Your wound feels warm to the touch.  You have a fever or chills.  You have a  general feeling of sickness (malaise).  You feel nauseous or you vomit.  You have pain that does not get better. Get help right away if:  You have a red streak that leads away from your wound.  You have non-clear fluid or more blood coming from your wound.  There is pus or a bad smell coming from your wound.  You have trouble moving your injured area.  You have numbness or tingling that extends beyond the wound. Summary  Animal bites can range from mild to serious. An animal bite can cause a scratch on the skin, a deep open cut, a puncture of the skin, a crush injury, tearing away of the skin or a body part, or a bone injury.  Your health care provider will examine your wound and ask for details about the animal and how the bite happened.  You may also have tests such as a blood test, X-ray, or testing of a fluid sample from your wound (culture test).  Treatment may include wound care, antibiotic medicine, a tetanus shot, and rabies treatment if the animal could have rabies. This information is not intended to replace advice given to you by your health care provider. Make sure you discuss any questions you have with your health care provider. Document Revised: 11/16/2017 Document Reviewed: 06/01/2017 Elsevier Patient Education  [place].

## 2020-04-14 ENCOUNTER — Other Ambulatory Visit: Payer: Self-pay

## 2020-04-14 NOTE — Patient Outreach (Signed)
[name] Care Management  04/14/2020  Brian Bass 09-14-1941 [id number]   Telephone Assessment   Outreach attempt to patient. No answer at present.      Plan: RN CM will make outreach attempt within the month of July.   Brian Montgomery, RN,BSN,CCM [place] Management Telephonic Care Management Coordinator Direct Phone: [phone] Toll Free: [phone] Fax: [phone]

## 2020-04-15 ENCOUNTER — Ambulatory Visit: Payer: Self-pay

## 2020-04-18 ENCOUNTER — Other Ambulatory Visit: Payer: Self-pay | Admitting: Family Medicine

## 2020-04-18 DIAGNOSIS — J849 Interstitial pulmonary disease, unspecified: Secondary | ICD-10-CM | POA: Diagnosis not present

## 2020-05-01 ENCOUNTER — Telehealth: Payer: Self-pay | Admitting: Family Medicine

## 2020-05-01 NOTE — Progress Notes (Signed)
  Chronic Care Management   Outreach Note  05/01/2020 Name: HARVIN KONICEK MRN: [id number] DOB: [birth year]  Referred by: Jinny Sanders, MD Reason for referral : No chief complaint on file.   An unsuccessful telephone outreach was attempted today. The patient was referred to the pharmacist for assistance with care management and care coordination.   This note is not being shared with the patient for the following reason: To respect privacy (The patient or proxy has requested that the information not be shared).  Follow Up Plan:   Earney [place] Upstream Scheduler

## 2020-05-05 ENCOUNTER — Other Ambulatory Visit: Payer: Self-pay | Admitting: *Deleted

## 2020-05-05 ENCOUNTER — Other Ambulatory Visit: Payer: Medicare HMO

## 2020-05-05 MED ORDER — MITIGARE 0.6 MG PO CAPS: ORAL_CAPSULE | ORAL | 0 refills | Status: DC

## 2020-05-05 NOTE — Telephone Encounter (Signed)
Received fax from [place] requesting refill for Cocrys but fax states insurance does not cover DAW Colcrys but will cover [place].  Ok to change to [employer]?  Please sign order below if okay to change.

## 2020-05-08 ENCOUNTER — Ambulatory Visit: Payer: Medicare HMO | Admitting: Family Medicine

## 2020-05-19 DIAGNOSIS — J849 Interstitial pulmonary disease, unspecified: Secondary | ICD-10-CM | POA: Diagnosis not present

## 2020-05-26 ENCOUNTER — Other Ambulatory Visit: Payer: Self-pay

## 2020-05-26 ENCOUNTER — Other Ambulatory Visit (INDEPENDENT_AMBULATORY_CARE_PROVIDER_SITE_OTHER): Payer: Medicare HMO

## 2020-05-26 ENCOUNTER — Other Ambulatory Visit: Payer: Self-pay | Admitting: Pharmacy Technician

## 2020-05-26 DIAGNOSIS — Z794 Long term (current) use of insulin: Secondary | ICD-10-CM | POA: Diagnosis not present

## 2020-05-26 DIAGNOSIS — E785 Hyperlipidemia, unspecified: Secondary | ICD-10-CM

## 2020-05-26 DIAGNOSIS — E1140 Type 2 diabetes mellitus with diabetic neuropathy, unspecified: Secondary | ICD-10-CM

## 2020-05-26 DIAGNOSIS — Z5181 Encounter for therapeutic drug level monitoring: Secondary | ICD-10-CM

## 2020-05-26 LAB — COMPREHENSIVE METABOLIC PANEL
ALT: 59 U/L — ABNORMAL HIGH (ref 0–53)
AST: 56 U/L — ABNORMAL HIGH (ref 0–37)
Albumin: 4.2 g/dL (ref 3.5–5.2)
Alkaline Phosphatase: 151 U/L — ABNORMAL HIGH (ref 39–117)
BUN: 17 mg/dL (ref 6–23)
CO2: 28 mEq/L (ref 19–32)
Calcium: 9.5 mg/dL (ref 8.4–10.5)
Chloride: 100 mEq/L (ref 96–112)
Creatinine, Ser: 1.23 mg/dL (ref 0.40–1.50)
GFR: 56.63 mL/min — ABNORMAL LOW (ref >60.00)
Glucose, Bld: 103 mg/dL — ABNORMAL HIGH (ref 70–99)
Potassium: 4.7 mEq/L (ref 3.5–5.1)
Sodium: 132 mEq/L — ABNORMAL LOW (ref 135–145)
Total Bilirubin: 1 mg/dL (ref 0.2–1.2)
Total Protein: 7.4 g/dL (ref 6.0–8.3)

## 2020-05-26 LAB — CBC WITH DIFFERENTIAL/PLATELET
Basophils Absolute: 0 10*3/uL (ref 0.0–0.1)
Basophils Relative: 0.3 % (ref 0.0–3.0)
Eosinophils Absolute: 0.3 10*3/uL (ref 0.0–0.7)
Eosinophils Relative: 2.8 % (ref 0.0–5.0)
HCT: 42.9 % (ref 39.0–52.0)
Hemoglobin: 14.6 g/dL (ref 13.0–17.0)
Lymphocytes Relative: 30.3 % (ref 12.0–46.0)
Lymphs Abs: 3.1 10*3/uL (ref 0.7–4.0)
MCHC: 34 g/dL (ref 30.0–36.0)
MCV: 89.9 fl (ref 78.0–100.0)
Monocytes Absolute: 0.9 10*3/uL (ref 0.1–1.0)
Monocytes Relative: 9 % (ref 3.0–12.0)
Neutro Abs: 5.9 10*3/uL (ref 1.4–7.7)
Neutrophils Relative %: 57.6 % (ref 43.0–77.0)
Platelets: 238 10*3/uL (ref 150.0–400.0)
RBC: 4.78 Mil/uL (ref 4.22–5.81)
RDW: 14.2 % (ref 11.5–15.5)
WBC: 10.2 10*3/uL (ref 4.0–10.5)

## 2020-05-26 LAB — LIPID PANEL
Cholesterol: 152 mg/dL (ref 0–200)
HDL: 30.6 mg/dL — ABNORMAL LOW (ref >39.00)
NonHDL: 120.91
Total CHOL/HDL Ratio: 5
Triglycerides: 238 mg/dL — ABNORMAL HIGH (ref 0.0–149.0)
VLDL: 47.6 mg/dL — ABNORMAL HIGH (ref 0.0–40.0)

## 2020-05-26 LAB — LDL CHOLESTEROL, DIRECT: Direct LDL: 91 mg/dL

## 2020-05-26 LAB — HEMOGLOBIN A1C: Hgb A1c MFr Bld: 7.4 % — ABNORMAL HIGH (ref 4.6–6.5)

## 2020-05-26 NOTE — Addendum Note (Signed)
Addended by: Ellamae Sia on: 05/26/2020 11:56 AM   Modules accepted: Orders

## 2020-05-26 NOTE — Patient Outreach (Signed)
[facility])   05/26/2020  Aquarius L Barnick 01-08-1941 [id number]   Per media tab in Epic, patient has been approved for [employer] thru [employer] as of 3/22 until 12/04/2020.  Will remove myself from care team  [name], [place] Certified Pharmacy Technician [employer]

## 2020-05-28 ENCOUNTER — Ambulatory Visit: Payer: Medicare HMO | Admitting: Pulmonary Disease

## 2020-05-28 ENCOUNTER — Other Ambulatory Visit: Payer: Self-pay

## 2020-05-28 ENCOUNTER — Encounter: Payer: Self-pay | Admitting: Pulmonary Disease

## 2020-05-28 VITALS — BP 128/72 | HR 85 | Temp 97.8°F | Ht 61.0 in | Wt 158.0 lb

## 2020-05-28 DIAGNOSIS — R7401 Elevation of levels of liver transaminase levels: Secondary | ICD-10-CM

## 2020-05-28 DIAGNOSIS — R05 Cough: Secondary | ICD-10-CM

## 2020-05-28 DIAGNOSIS — J84112 Idiopathic pulmonary fibrosis: Secondary | ICD-10-CM

## 2020-05-28 DIAGNOSIS — R059 Cough, unspecified: Secondary | ICD-10-CM

## 2020-05-28 MED ORDER — GABAPENTIN 100 MG PO CAPS: 100.0000 mg | ORAL_CAPSULE | Freq: Two times a day (BID) | ORAL | 1 refills | Status: DC

## 2020-05-28 MED ORDER — ONDANSETRON HCL 4 MG PO TABS: 4.0000 mg | ORAL_TABLET | Freq: Every day | ORAL | 1 refills | Status: DC | PRN

## 2020-05-28 NOTE — Progress Notes (Incomplete)
 Subjective:    Patient ID: Rafeal L Saylor, male    DOB: 07/20/1941, 79 y.o.   MRN: [id number]  HPI Patient is a 79-year-old lifelong never smoker with idiopathic pulmonary fibrosis who presents for follow-up of the same.  This is a scheduled visit.  The patient has been started on Ofev  Review of Systems A 10 point review of systems was performed and it is as noted above otherwise negative.  DATA:  Serology 02/27/18>>ACE, rheumatoid factor, ANA negative.  PFT tracings 3/26/19FVC is 63% predicted, FEV1 of 70% predicted, ratio 86% predicted. There is no significant change with bronchodilator therapy. Flow volume loop is consistent with restriction.DLCO is 54% predicted, DLCO is reduced at 56%.  CT high-resolution 02/27/18>>the bibasilar fibrotic changes have advanced significantly in the subpleural areas of traction bronchiectasis changes have worsened. Right paratracheal lymphadenopathy minimally changed.  Chest x-ray 02/08/18;diffuse groundglass and interstitial changes throughout both lungs, right diaphragm is elevated. Interstitial changes has progressed significantly since previous chest x-ray on 09/06/13. CT chest 10/21/13, bilateral emphysematous changes, bilateral subpleural reticular changes consistent with pulmonary fibrosis.   Chemistry 01/23/2019>>AST, ALT normal.  Overnight oximetry 10/08/2018>>performed on room air; time less than or equal to 88% was 0.7 minutes. Oxygen was not required.  ONOX 11/13/2019 no significant desaturations though cyclical desats to 87% were noted.  CT chest high resolution 02/05/2020 persistent severe fibrotic changes unchanged from 3/19 however significantly progressed from 2014.  2D echo 02/05/2020 LVEF 60 to 65% moderate pulmonary hypertension.   No Known Allergies  Current Meds  Medication Sig  . ACCU-CHEK FASTCLIX LANCETS MISC 1 Device by Other route 3 (three) times daily. Check blood sugars 2 to 3 times daily.  Dx: 250.02  .  ACCU-CHEK SMARTVIEW test strip TEST BLOOD SUGAR 2 TO 3 TIMES DAILY  . aspirin 81 MG tablet Take 81 mg by mouth daily.  . BD INSULIN SYRINGE U/F 31G X 5/16" 1 ML MISC USE TO INJECT LANTUS INSULIN 2 TIMES DAILY  . benzonatate (TESSALON) 100 MG capsule TAKE 2 CAPSULES 3 TIMES DAILY  . Blood Glucose Monitoring Suppl (ACCU-CHEK NANO SMARTVIEW) W/DEVICE KIT 1 kit by Other route 3 (three) times daily. Use to check blood sugars 2 to 3 times daily.  Dx: 250.02  . cholestyramine light (PREVALITE) 4 g packet Take 1 packet (4 g total) by mouth 2 (two) times daily as needed (Diarrhea).  . ezetimibe (ZETIA) 10 MG tablet TAKE 1 TABLET BY MOUTH DAILY  . fenofibrate 160 MG tablet Take 1 tablet (160 mg total) by mouth daily.  . glipiZIDE (GLUCOTROL) 10 MG tablet TAKE ONE TABLET BY MOUTH TWICE A DAY BEFORE A MEAL  . insulin glargine (LANTUS) 100 UNIT/ML injection Inject 70 Units into the skin daily.  . MITIGARE 0.6 MG CAPS FOR FLARE TAKE 2 TABLETS ONCE THEN REPEAT 1 TABLET IN 2 HOURS IF PAINCONTINUES, THEN 1 TABLET DAILY  . Multiple Vitamin (MULTIVITAMIN) tablet Take 1 tablet by mouth daily.    . Nintedanib (OFEV) 150 MG CAPS Take 150 mg by mouth 2 (two) times daily.  . omeprazole (PRILOSEC) 20 MG capsule TAKE 1 CAPSULE EVERY DAY  . ondansetron (ZOFRAN-ODT) 4 MG disintegrating tablet TAKE ONE TABLET EVERY EIGHT HOURS AS NEEDED FOR NAUSEA / VOMITING  . simvastatin (ZOCOR) 40 MG tablet TAKE 1 TABLET AT BEDTIME  . tamsulosin (FLOMAX) 0.4 MG CAPS capsule TAKE 1 CAPSULE BY MOUTH DAILY. IF NOT IMPROVING, INCREASE TO 2 TABLETS DAILY  . Tiotropium Bromide-Olodaterol (STIOLTO RESPIMAT) 2.5-2.5 MCG/ACT AERS   Inhale 2 puffs into the lungs daily.  . [DISCONTINUED] amoxicillin-clavulanate (AUGMENTIN) 875-125 MG tablet Take 1 tablet by mouth 2 (two) times daily.   Immunization History  Administered Date(s) Administered  . Fluad Quad(high Dose 65+) 08/08/2019  . Influenza Split 10/05/2011, 10/11/2012  . Influenza Whole  11/12/2010  . Influenza,inj,Quad PF,6+ Mos 09/06/2013, 09/24/2014, 10/02/2015, 09/13/2016, 09/29/2017, 08/24/2018  . PFIZER SARS-COV-2 Vaccination 01/22/2020, 02/12/2020  . Pneumococcal Conjugate-13 01/12/2018  . Pneumococcal Polysaccharide-23 12/06/2007  . Td 08/10/2010, 04/10/2020       Objective:   Physical Exam BP 128/72 (BP Location: Left Arm, Cuff Size: Normal)   Pulse 85   Temp 97.8 F (36.6 C) (Temporal)   Ht 5' 1" (1.549 m)   Wt 158 lb (71.7 kg)   SpO2 92%   BMI 29.85 kg/m   GENERAL: HEAD: Normocephalic, atraumatic.  EYES: Pupils equal, round, reactive to light.  No scleral icterus.  MOUTH:  NECK: Supple. No thyromegaly. Trachea midline. No JVD.  No adenopathy. PULMONARY: Lungs clear to auscultation bilaterally. CARDIOVASCULAR: S1 and S2. Regular rate and rhythm.  GASTROINTESTINAL: MUSCULOSKELETAL: No joint deformity, no clubbing, no edema.  NEUROLOGIC:  SKIN: Intact,warm,dry. PSYCH:       Assessment & Plan:     ICD-10-CM   1. IPF (idiopathic pulmonary fibrosis) (HCC)  J84.112 Comp Met (CMET)    CBC w/Diff   Not tolerating Ofev well Decrease Ofev to once a day  2. Cough  R05    Trial of gabapentin 100 mg twice a day Continue as needed Tessalon Continue Stiolto  3. Transaminitis  R74.01    Decrease Ofev to once daily Repeat c-Met in 2 weeks Zofran as needed for nausea

## 2020-05-28 NOTE — Patient Instructions (Signed)
What we discussed today:   We will need blood work drawn in 2 weeks this has been sent to the lab  Decrease Ofev to once daily  We have refilled Zofran for a amount, if you need this medication any further contact your primary physician  You will start Neurontin 1 capsule twice a day if this is not helping your cough you may increase to 3 times a day but do let us know how this is working for you it may take several days to a week for it to start showing effect  Continue using your Stiolto  We will see you in follow-up in 4 to 6 weeks time call sooner should any new problems arise

## 2020-06-01 ENCOUNTER — Telehealth: Payer: Self-pay | Admitting: Family Medicine

## 2020-06-01 ENCOUNTER — Other Ambulatory Visit: Payer: Self-pay | Admitting: Pharmacist

## 2020-06-01 NOTE — Telephone Encounter (Signed)
[name] would like a call back on her cell phone regarding the question she had about getting help with husband's insulin medication. She said you would know what she is talking about.

## 2020-06-01 NOTE — Telephone Encounter (Signed)
Spoke with Brian Bass.  Patient is in donut hole and they are paying $800/month for his insulin.  She is asking to apply for patient assistance again from [employer].  I have sent a message over to [facility]) who did this for him last year, to see if she can start application process for Brian Bass.

## 2020-06-01 NOTE — Patient Outreach (Addendum)
[facility])                                            [place] Team    06/01/2020  Brian Bass 1941-01-09 [id number]  Referral received for medication assistance for insulin.  Per notes, patient in coverage gap and paying >$800/ month.     Per review of chart, I spoke with patient and spouse regarding medication assistance for Lantus in Feb 2021.  At that time, patient declined to switch to other insulin product and was not eligible yet for [employer] program due to not meeting TROOP (2% household income).   Successful call with patient's spouse.  She is not sure how much patient has paid so far in co-pays.   She requests that we mail her application and she send back with print-out from pharmacy.   Provided my contact information to spouse if she needs to call me back with other questions.    Plan: I will route patient assistance letter to [place] technician who will coordinate patient assistance program application process for Lantus.  [facility] pharmacy technician will assist with obtaining all required documents from both patient and provider(s) and submit application(s) once completed.      Ralene Bathe, PharmD, [place] [phone]

## 2020-06-03 ENCOUNTER — Other Ambulatory Visit: Payer: Self-pay | Admitting: Pharmacy Technician

## 2020-06-03 NOTE — Patient Outreach (Signed)
[place] [facility])   06/03/2020  Brian Bass 05/07/1941 [id number]                                       Medication Assistance Referral  Referral From: [place]  Medication/Company: Lantus / [employer]ofi Patient application portion:  Education officer, museum portion: Faxed  to Dr. Diona Browner Provider address/fax verified via: Office website   [name], [place] Certified Pharmacy Technician [employer]

## 2020-06-04 ENCOUNTER — Other Ambulatory Visit: Payer: Self-pay | Admitting: Pharmacy Technician

## 2020-06-10 ENCOUNTER — Other Ambulatory Visit: Payer: Self-pay

## 2020-06-10 NOTE — Patient Outreach (Signed)
[place] [facility]) Care Management  06/10/2020  Brian Bass 1941-03-05 [id number]   Telephone Assessment    Unsuccessful outreach attempt to patent.      Plan: RN CM will make quarterly outreach attempt to patient within the month of October.    Enzo Montgomery, RN,BSN,CCM [place] Management Telephonic Care Management Coordinator Direct Phone: [phone] Toll Free: [phone] Fax: [phone]

## 2020-06-12 NOTE — Telephone Encounter (Signed)
Received financial assistance paperwork for lantus insulin.  Need to know how much insulin pt is using.  LVM

## 2020-06-16 NOTE — Telephone Encounter (Signed)
Spoke with Brian Bass.  He states he is taking 70 units qhs.  Completed application faxed to [facility] Patient Assistance at [phone].

## 2020-06-18 ENCOUNTER — Other Ambulatory Visit: Payer: Self-pay

## 2020-06-18 ENCOUNTER — Ambulatory Visit: Payer: Self-pay

## 2020-06-18 ENCOUNTER — Other Ambulatory Visit (INDEPENDENT_AMBULATORY_CARE_PROVIDER_SITE_OTHER): Payer: Medicare HMO

## 2020-06-18 ENCOUNTER — Telehealth: Payer: Self-pay | Admitting: Family Medicine

## 2020-06-18 DIAGNOSIS — J84112 Idiopathic pulmonary fibrosis: Secondary | ICD-10-CM

## 2020-06-18 DIAGNOSIS — J849 Interstitial pulmonary disease, unspecified: Secondary | ICD-10-CM | POA: Diagnosis not present

## 2020-06-18 NOTE — Telephone Encounter (Signed)
Patient's wife [name] called and needs [name] to give her a call about some medication regarding Brian Bass.

## 2020-06-18 NOTE — Telephone Encounter (Signed)
Spoke with [name].  She states she received another patient assistance application is the mail and is wanting to know what she needs to do with it.  I advised that we completed and faxed in Hameed's application, for Lantus, on Tuesday so that is probably just a duplicate.  I told her to just hold on to it but nothing needs to be done.  We are just waiting to hear back from [employer] to see if Nate gets approved and I will call them as soon as I hear anything.

## 2020-06-19 LAB — CBC WITH DIFFERENTIAL/PLATELET
Basophils Absolute: 0 10*3/uL (ref 0.0–0.2)
Basos: 0 %
EOS (ABSOLUTE): 0.3 10*3/uL (ref 0.0–0.4)
Eos: 2 %
Hematocrit: 38.9 % (ref 37.5–51.0)
Hemoglobin: 13.8 g/dL (ref 13.0–17.7)
Immature Grans (Abs): 0.1 10*3/uL (ref 0.0–0.1)
Immature Granulocytes: 1 %
Lymphocytes Absolute: 4.1 10*3/uL — ABNORMAL HIGH (ref 0.7–3.1)
Lymphs: 31 %
MCH: 30.9 pg (ref 26.6–33.0)
MCHC: 35.5 g/dL (ref 31.5–35.7)
MCV: 87 fL (ref 79–97)
Monocytes Absolute: 1.4 10*3/uL — ABNORMAL HIGH (ref 0.1–0.9)
Monocytes: 11 %
Neutrophils Absolute: 7.3 10*3/uL — ABNORMAL HIGH (ref 1.4–7.0)
Neutrophils: 55 %
Platelets: 322 10*3/uL (ref 150–450)
RBC: 4.46 x10E6/uL (ref 4.14–5.80)
RDW: 12.6 % (ref 11.6–15.4)
WBC: 13.2 10*3/uL — ABNORMAL HIGH (ref 3.4–10.8)

## 2020-06-19 LAB — COMPREHENSIVE METABOLIC PANEL WITH GFR
ALT: 38 IU/L (ref 0–44)
AST: 27 IU/L (ref 0–40)
Albumin/Globulin Ratio: 1.1 — ABNORMAL LOW (ref 1.2–2.2)
Albumin: 3.9 g/dL (ref 3.7–4.7)
Alkaline Phosphatase: 190 IU/L — ABNORMAL HIGH (ref 48–121)
BUN/Creatinine Ratio: 14 (ref 10–24)
BUN: 15 mg/dL (ref 8–27)
Bilirubin Total: 0.5 mg/dL (ref 0.0–1.2)
CO2: 23 mmol/L (ref 20–29)
Calcium: 9.2 mg/dL (ref 8.6–10.2)
Chloride: 96 mmol/L (ref 96–106)
Creatinine, Ser: 1.06 mg/dL (ref 0.76–1.27)
GFR calc Af Amer: 77 mL/min/1.73
GFR calc non Af Amer: 66 mL/min/1.73
Globulin, Total: 3.5 g/dL (ref 1.5–4.5)
Glucose: 116 mg/dL — ABNORMAL HIGH (ref 65–99)
Potassium: 5.1 mmol/L (ref 3.5–5.2)
Sodium: 131 mmol/L — ABNORMAL LOW (ref 134–144)
Total Protein: 7.4 g/dL (ref 6.0–8.5)

## 2020-06-19 LAB — SPECIMEN STATUS REPORT

## 2020-06-23 ENCOUNTER — Other Ambulatory Visit: Payer: Self-pay | Admitting: Family Medicine

## 2020-06-23 NOTE — Telephone Encounter (Signed)
Received letter from [employer] stating Mr. Brian Bass is not eligible to received patient assistance product through the [facility] Patient Connection Program.  It states: Your total out of pocket medication spend YTD has not yet reached the 2% of income requirement.  I left message for Brian Bass letting her know this.

## 2020-06-24 NOTE — Telephone Encounter (Signed)
Last office visit 04/10/2020 with R. Baity for cat bite.  Last refilled 08/06/2019 for #180 with 3 refills.  Next Appt: 09/02/20201 for 6 month follow up.

## 2020-07-06 ENCOUNTER — Other Ambulatory Visit: Payer: Self-pay

## 2020-07-06 NOTE — Patient Outreach (Signed)
[place] [facility]) Care Management  07/06/2020  SLAYDE BRAULT [birth year] [id number]     Telephone call from [place]. She states patient's wife called and said he was needing medication. Patient active with Enzo Montgomery , RN for disease management. Advised that CM would call to assist.    Telephone call to wife [name]. She states that patient is out of medication. She states she has someone else on the line that is trying to help as well.  She states that she will talk with that person right now to see if they can help.    Plan: RN CM will notify primary nurse of call.    Jone Baseman, RN, MSN [place] Management Care Management Coordinator Direct Line [phone] Cell [phone] Toll Free: [phone]  Fax: [phone]

## 2020-07-09 ENCOUNTER — Encounter: Payer: Self-pay | Admitting: Pulmonary Disease

## 2020-07-09 ENCOUNTER — Ambulatory Visit: Payer: Medicare HMO | Admitting: Pulmonary Disease

## 2020-07-09 ENCOUNTER — Other Ambulatory Visit: Payer: Self-pay

## 2020-07-09 VITALS — BP 126/76 | HR 82 | Temp 97.9°F | Ht 61.0 in | Wt 156.0 lb

## 2020-07-09 DIAGNOSIS — Z5181 Encounter for therapeutic drug level monitoring: Secondary | ICD-10-CM

## 2020-07-09 MED ORDER — BREZTRI AEROSPHERE 160-9-4.8 MCG/ACT IN AERO: 2.0000 | INHALATION_SPRAY | Freq: Two times a day (BID) | RESPIRATORY_TRACT | 6 refills | Status: DC

## 2020-07-09 MED ORDER — HYDROXYZINE PAMOATE 25 MG PO CAPS: 25.0000 mg | ORAL_CAPSULE | Freq: Three times a day (TID) | ORAL | 0 refills | Status: DC | PRN

## 2020-07-09 MED ORDER — BREZTRI AEROSPHERE 160-9-4.8 MCG/ACT IN AERO: 2.0000 | INHALATION_SPRAY | Freq: Every day | RESPIRATORY_TRACT | 0 refills | Status: DC

## 2020-07-09 NOTE — Patient Instructions (Addendum)
You should change your Neurontin (gabapentin) to 3 capsules at bedtime this will be 300 mg, take it at bedtime.  If this helps you let us know so I can call in a prescription for the 300 mg size capsule.  Your cough is not a result of any infection.  This is from your fibrosis your lungs are stiff and that is what makes you cough.  The infections of Pseudomonas and Serratia are usually in people that have bronchiectasis, cystic fibrosis or other chronic infectious issues.  This is not your problem.  These infections usually make you cough copious amounts of phlegm and sputum.  They do not cause a dry hacking cough.   I am giving you a trial of a different inhaler called Breztri.  Take 2 puffs twice a day.  Do not take the Stiolto anymore.   I have also sent in a prescription for Vistaril for your anxiety.   Increase the OFEV to twice a day.  We will check liver function in 2 weeks.

## 2020-07-09 NOTE — Progress Notes (Signed)
 Assessment & Plan:  1. Therapeutic drug monitoring (Primary) - Comp Met (CMET); Future   Patient Instructions  You should change your Neurontin  (gabapentin ) to 3 capsules at bedtime this will be 300 mg, take it at bedtime.  If this helps you let us  know so I can call in a prescription for the 300 mg size capsule.  Your cough is not a result of any infection.  This is from your fibrosis your lungs are stiff and that is what makes you cough.  The infections of Pseudomonas and Serratia are usually in people that have bronchiectasis, cystic fibrosis or other chronic infectious issues.  This is not your problem.  These infections usually make you cough copious amounts of phlegm and sputum.  They do not cause a dry hacking cough.   I am giving you a trial of a different inhaler called Breztri .  Take 2 puffs twice a day.  Do not take the Stiolto anymore.   I have also sent in a prescription for Vistaril  for your anxiety.   Increase the OFEV to twice a day.  We will check liver function in 2 weeks.     Please note: late entry documentation due to logistical difficulties during COVID-19 pandemic. This note is filed for information purposes only, and is not intended to be used for billing, nor does it represent the full scope/nature of the visit in question. Please see any associated scanned media linked to date of encounter for additional pertinent information.  Subjective:    HPI: Brian Bass is a 79 y.o. male presenting to the pulmonology clinic on 07/09/2020 with report of: Follow-up (c/o non prod cough that worsens at night.)     Outpatient Encounter Medications as of 07/09/2020  Medication Sig Note   ACCU-CHEK FASTCLIX LANCETS MISC 1 Device by Other route 3 (three) times daily. Check blood sugars 2 to 3 times daily.  Dx: 250.02    ACCU-CHEK SMARTVIEW test strip TEST BLOOD SUGAR 2 TO 3 TIMES DAILY    Blood Glucose Monitoring Suppl (ACCU-CHEK NANO SMARTVIEW) W/DEVICE KIT 1  kit by Other route 3 (three) times daily. Use to check blood sugars 2 to 3 times daily.  Dx: 250.02    Multiple Vitamin (MULTIVITAMIN) tablet Take 1 tablet by mouth daily.    [DISCONTINUED] aspirin 81 MG tablet Take 81 mg by mouth daily. (Patient not taking: Reported on 12/10/2020)    [DISCONTINUED] BD INSULIN  SYRINGE U/F 31G X 5/16 1 ML MISC USE TO INJECT LANTUS  INSULIN  2 TIMES DAILY    [DISCONTINUED] benzonatate  (TESSALON ) 100 MG capsule TAKE 2 CAPSULES BY MOUTH 3 TIMES DAILY (Patient not taking: Reported on 10/21/2021)    [DISCONTINUED] cholestyramine  light (PREVALITE ) 4 g packet Take 1 packet (4 g total) by mouth 2 (two) times daily as needed (Diarrhea).    [DISCONTINUED] ezetimibe  (ZETIA ) 10 MG tablet TAKE 1 TABLET BY MOUTH DAILY (Patient not taking: Reported on 08/24/2021)    [DISCONTINUED] fenofibrate  160 MG tablet Take 1 tablet (160 mg total) by mouth daily.    [DISCONTINUED] gabapentin  (NEURONTIN ) 100 MG capsule Take 1 capsule (100 mg total) by mouth 2 (two) times daily.    [DISCONTINUED] glipiZIDE  (GLUCOTROL ) 10 MG tablet TAKE ONE TABLET BY MOUTH TWICE A DAY BEFORE A MEAL    [DISCONTINUED] insulin  glargine (LANTUS ) 100 UNIT/ML injection Inject 70 Units into the skin daily. 08/13/2019: Reports usually takes 70- 100 Units q HS; now taking mainly 70 Units q HS   [DISCONTINUED] MITIGARE   0.6 MG CAPS FOR FLARE TAKE 2 TABLETS ONCE THEN REPEAT 1 TABLET IN 2 HOURS IF PAINCONTINUES, THEN 1 TABLET DAILY    [DISCONTINUED] Nintedanib (OFEV) 150 MG CAPS Take 150 mg by mouth 2 (two) times daily.    [DISCONTINUED] omeprazole  (PRILOSEC) 20 MG capsule TAKE 1 CAPSULE EVERY DAY    [DISCONTINUED] ondansetron  (ZOFRAN ) 4 MG tablet Take 1 tablet (4 mg total) by mouth daily as needed for nausea or vomiting.    [DISCONTINUED] ondansetron  (ZOFRAN -ODT) 4 MG disintegrating tablet TAKE ONE TABLET EVERY EIGHT HOURS AS NEEDED FOR NAUSEA / VOMITING    [DISCONTINUED] simvastatin  (ZOCOR ) 40 MG tablet TAKE 1 TABLET AT BEDTIME     [DISCONTINUED] tamsulosin  (FLOMAX ) 0.4 MG CAPS capsule TAKE 1 CAPSULE BY MOUTH DAILY. IF NOT IMPROVING, INCREASE TO 2 TABLETS DAILY    [DISCONTINUED] Tiotropium Bromide-Olodaterol (STIOLTO RESPIMAT ) 2.5-2.5 MCG/ACT AERS Inhale 2 puffs into the lungs daily.    [DISCONTINUED] Budeson-Glycopyrrol-Formoterol (BREZTRI  AEROSPHERE) 160-9-4.8 MCG/ACT AERO Inhale 2 puffs into the lungs daily.    [DISCONTINUED] Budeson-Glycopyrrol-Formoterol (BREZTRI  AEROSPHERE) 160-9-4.8 MCG/ACT AERO Inhale 2 puffs into the lungs in the morning and at bedtime.    [DISCONTINUED] hydrOXYzine  (VISTARIL ) 25 MG capsule Take 1 capsule (25 mg total) by mouth 3 (three) times daily as needed for anxiety or nausea.    No facility-administered encounter medications on file as of 07/09/2020.      Objective:   Vitals:   07/09/20 1454  BP: 126/76  Pulse: 82  Temp: 97.9 F (36.6 C)  Height: 5' 1 (1.549 m)  Weight: 156 lb (70.8 kg)  SpO2: 93%  TempSrc: Temporal  BMI (Calculated): 29.49     Physical exam documentation is limited by delayed entry of information.

## 2020-07-13 ENCOUNTER — Other Ambulatory Visit: Payer: Self-pay | Admitting: Pharmacy Technician

## 2020-07-13 NOTE — Patient Outreach (Signed)
[facility]) Care Management  07/13/2020  Brian Bass 06-24-1941 [id number]                                       Medication Assistance Referral  Referral From: [facility]  Medication/Company: [name] / [place] Patient application portion:  Mailed Provider application portion: Faxed  to Dr. Vernard Gambles Provider address/fax verified via: Office website     Follow up:  Will follow up with patient in 10-15 business days to confirm application(s) have been received.  Hajime Asfaw P. Laniesha Das, [name]  [phone]

## 2020-07-16 ENCOUNTER — Other Ambulatory Visit: Payer: Self-pay | Admitting: Pulmonary Disease

## 2020-07-19 DIAGNOSIS — J849 Interstitial pulmonary disease, unspecified: Secondary | ICD-10-CM | POA: Diagnosis not present

## 2020-07-20 ENCOUNTER — Other Ambulatory Visit: Payer: Self-pay | Admitting: Pharmacy Technician

## 2020-07-20 NOTE — Patient Outreach (Signed)
[place] [facility]) Care Management  07/20/2020  CASHEL BELLINA 1941/06/08 [id number]    Incoming call from patient's wife [name] regarding patient assistance application(s) for [employer] with [employer] , HIPAA identifiers verified.   Patient's wife informed she had received the applications. She inquired about the items that could be submitted for proof of income, if we needed a copy of the insurance card and where to send the information. . Informed her that the social security statements would be sufficient to satisfy that requirement. Also informed her that we had a copy of his insurance cards on file. Informed patient's wife  to mail back the application in the envelope that I sent with the applications. Patient's wife verbalized understanding.  Patient's wife also inquired about how long the process would take. Informed patient's wife that once I receive everything and submit the application, the company usually makes a determination within 5 business days sometimes it takes up to 7 business days. Patient's wife informed she would outreach provider to inquire about additional samples as they would not be able to afford to pay the cost this month for the inhaler. Agreed with patient that samples would be beneficial as we await a determination.  Follow up:  Will close case if patient assistance paperwork is not received back within 15 business days.  Miryam Mcelhinney P. Paulanthony Gleaves, [place]  [phone]

## 2020-07-21 ENCOUNTER — Other Ambulatory Visit: Payer: Self-pay | Admitting: Pulmonary Disease

## 2020-07-21 ENCOUNTER — Telehealth: Payer: Self-pay | Admitting: Pulmonary Disease

## 2020-07-21 MED ORDER — GABAPENTIN 300 MG PO CAPS: 300.0000 mg | ORAL_CAPSULE | Freq: Every day | ORAL | 1 refills | Status: DC

## 2020-07-21 NOTE — Telephone Encounter (Signed)
Called and spoke to patient's spouse, [name](DPR).  [name] is aware that Rx for Gabapentin 346m has been sent to preferred pharmacy.  ADewayne Hatchto call next week to see if we have gotten samples of Breztri in, as we do not currently have any.  Nothing further is needed

## 2020-07-22 ENCOUNTER — Other Ambulatory Visit: Payer: Self-pay | Admitting: Pharmacy Technician

## 2020-07-22 NOTE — Patient Outreach (Signed)
[facility]) Care Management  07/22/2020  Brian Bass 01-02-1941 [id number]   Received patient's portion(s) of patient assistance application(s) for [employer]. Have NOT received provider's portion for Dr. Vernard Gambles. Re faxed the office today. Faxed patient's  completed application and required documents into [employer]. Once I have received provider's portion then I will fax that in as well.  Will follow up with Provider's office in 3-5 business days to check on status of provider's portion of the application.   [name] P. [name], [place]  [phone]

## 2020-07-23 ENCOUNTER — Other Ambulatory Visit
Admission: RE | Admit: 2020-07-23 | Discharge: 2020-07-23 | Disposition: A | Discharge status: Home / Self Care | Payer: Medicare HMO | Attending: Pulmonary Disease | Admitting: Pulmonary Disease

## 2020-07-23 DIAGNOSIS — Z5181 Encounter for therapeutic drug level monitoring: Secondary | ICD-10-CM | POA: Diagnosis not present

## 2020-07-23 LAB — COMPREHENSIVE METABOLIC PANEL
ALT: 19 U/L (ref 0–44)
AST: 20 U/L (ref 15–41)
Albumin: 3.8 g/dL (ref 3.5–5.0)
Alkaline Phosphatase: 120 U/L (ref 38–126)
Anion gap: 10 (ref 5–15)
BUN: 17 mg/dL (ref 8–23)
CO2: 24 mmol/L (ref 22–32)
Calcium: 9 mg/dL (ref 8.9–10.3)
Chloride: 96 mmol/L — ABNORMAL LOW (ref 98–111)
Creatinine, Ser: 0.99 mg/dL (ref 0.61–1.24)
GFR calc Af Amer: >60 mL/min (ref >60)
GFR calc non Af Amer: >60 mL/min (ref >60)
Glucose, Bld: 114 mg/dL — ABNORMAL HIGH (ref 70–99)
Potassium: 4.8 mmol/L (ref 3.5–5.1)
Sodium: 130 mmol/L — ABNORMAL LOW (ref 135–145)
Total Bilirubin: 1 mg/dL (ref 0.3–1.2)
Total Protein: 7.9 g/dL (ref 6.5–8.1)

## 2020-07-23 LAB — BILIRUBIN, DIRECT: Bilirubin, Direct: 0.1 mg/dL (ref 0.0–0.2)

## 2020-07-24 ENCOUNTER — Telehealth: Payer: Self-pay

## 2020-07-24 MED ORDER — BREZTRI AEROSPHERE 160-9-4.8 MCG/ACT IN AERO: 2.0000 | INHALATION_SPRAY | Freq: Two times a day (BID) | RESPIRATORY_TRACT | 11 refills | Status: DC

## 2020-07-24 NOTE — Telephone Encounter (Signed)
Received Rx request for Breztri from [facility].  Rx has been printed and placed in Dr. Domingo Dimes folder for signature.

## 2020-07-27 NOTE — Telephone Encounter (Signed)
Rx for Brian Bass has been faxed to [employer].  Nothing further is needed.

## 2020-07-28 ENCOUNTER — Other Ambulatory Visit: Payer: Self-pay | Admitting: Pharmacy Technician

## 2020-07-28 NOTE — Patient Outreach (Signed)
[place] [facility]) Care Management  07/28/2020  Brian Bass 1941-08-07 [id number]   Received provider's portion(s) of patient assistance application(s) for [employer]. Faxed completed application and required documents into [employer].  Will follow up with company(ies) in 5-10 business days to check status of application(s).  Marnette Perkins P. Nickolette Espinola, [place]  [phone]

## 2020-07-31 ENCOUNTER — Telehealth: Payer: Self-pay | Admitting: Family Medicine

## 2020-07-31 DIAGNOSIS — M1009 Idiopathic gout, multiple sites: Secondary | ICD-10-CM

## 2020-07-31 DIAGNOSIS — E1140 Type 2 diabetes mellitus with diabetic neuropathy, unspecified: Secondary | ICD-10-CM

## 2020-07-31 NOTE — Telephone Encounter (Signed)
-----  Message from Ellamae Sia sent at 07/20/2020 10:27 AM EDT ----- Regarding: Lab orders for Monday, 8.30.21 Lab orders for a 6 month follow up appt

## 2020-08-03 ENCOUNTER — Other Ambulatory Visit (INDEPENDENT_AMBULATORY_CARE_PROVIDER_SITE_OTHER): Payer: Medicare HMO

## 2020-08-03 ENCOUNTER — Other Ambulatory Visit: Payer: Self-pay

## 2020-08-03 DIAGNOSIS — E1140 Type 2 diabetes mellitus with diabetic neuropathy, unspecified: Secondary | ICD-10-CM

## 2020-08-03 DIAGNOSIS — M1009 Idiopathic gout, multiple sites: Secondary | ICD-10-CM

## 2020-08-03 DIAGNOSIS — Z794 Long term (current) use of insulin: Secondary | ICD-10-CM | POA: Diagnosis not present

## 2020-08-03 LAB — COMPREHENSIVE METABOLIC PANEL
ALT: 16 U/L (ref 0–53)
AST: 18 U/L (ref 0–37)
Albumin: 4 g/dL (ref 3.5–5.2)
Alkaline Phosphatase: 111 U/L (ref 39–117)
BUN: 17 mg/dL (ref 6–23)
CO2: 25 mEq/L (ref 19–32)
Calcium: 9.7 mg/dL (ref 8.4–10.5)
Chloride: 97 mEq/L (ref 96–112)
Creatinine, Ser: 1.06 mg/dL (ref 0.40–1.50)
GFR: 67.2 mL/min (ref >60.00)
Glucose, Bld: 62 mg/dL — ABNORMAL LOW (ref 70–99)
Potassium: 4 mEq/L (ref 3.5–5.1)
Sodium: 130 mEq/L — ABNORMAL LOW (ref 135–145)
Total Bilirubin: 0.4 mg/dL (ref 0.2–1.2)
Total Protein: 7.9 g/dL (ref 6.0–8.3)

## 2020-08-03 LAB — LIPID PANEL
Cholesterol: 124 mg/dL (ref 0–200)
HDL: 32.8 mg/dL — ABNORMAL LOW (ref >39.00)
LDL Cholesterol: 72 mg/dL (ref 0–99)
NonHDL: 91.48
Total CHOL/HDL Ratio: 4
Triglycerides: 97 mg/dL (ref 0.0–149.0)
VLDL: 19.4 mg/dL (ref 0.0–40.0)

## 2020-08-03 LAB — HEMOGLOBIN A1C: Hgb A1c MFr Bld: 7.1 % — ABNORMAL HIGH (ref 4.6–6.5)

## 2020-08-03 LAB — URIC ACID: Uric Acid, Serum: 6.2 mg/dL (ref 4.0–7.8)

## 2020-08-03 NOTE — Progress Notes (Signed)
No critical labs need to be addressed urgently. We will discuss labs in detail at upcoming office visit.

## 2020-08-04 ENCOUNTER — Other Ambulatory Visit: Payer: Self-pay | Admitting: Pharmacy Technician

## 2020-08-04 NOTE — Patient Outreach (Signed)
[place] [facility]) Care Management  08/04/2020  PARAG DORTON 1941-02-09 [id number]  Care coordination call placed to [employer] in regards to patient's [facility] application.  Spoke to [place] who informed patient was APPROVED 07/23/2020-12/04/2020. She informed the medication is initially shipped as UPS but final shipment is made by [employer]PS. She informed patient should receive the medication in the next 7-10 business days.  Will follow up with patient in 7-14 business days to inquire if medication was received and to discuss refill procedure.  Johnita Palleschi P. Kataleena Holsapple, [place]  [phone]

## 2020-08-05 ENCOUNTER — Other Ambulatory Visit: Payer: Self-pay | Admitting: Pharmacy Technician

## 2020-08-05 ENCOUNTER — Telehealth: Payer: Self-pay | Admitting: Family Medicine

## 2020-08-05 NOTE — Telephone Encounter (Signed)
Spoke to Brian Bass.  She states she is trying to get Brian Bass's Lantus insulin through patient assistance and thinks they have now met their out of pocket but she able to call and speak to someone with the patient assistance program and they are going to run his application again to see if they now qualify.  Nothing else is needed on our end.

## 2020-08-05 NOTE — Patient Outreach (Signed)
[facility]) Care Management  08/05/2020  Brian Bass [birth year] [id number]  Received message from [place] Summe that patient had questions about the delivery of Breztri from [employer].  Successful outreach call placed to patient, HIPAA identifiers verified.  Spoke to patient's wife Brian Bass, HIPAA was verified. Informed Brian Bass that patient was approved to received the medication from the patient assistance foundation. She was inquiring as to where the medication would be delivered. Informed patient's wife that [employer] does not deliver to PO Boxes. The address on the application is the patient's physical address. Confirmed with Brian Bass that the address was correct.  Will follow up with [employer] in 2-3 business days to inquire about tracking information to relay back to patient.  Brian Bass, [place]  [phone]

## 2020-08-05 NOTE — Telephone Encounter (Signed)
Patient's wife called. She's asking to speak to [name] about patient's Lantis.  I let her know Donna's working in the lab today and it may be tomorrow before she hears from her.  She said that was fine.

## 2020-08-06 ENCOUNTER — Encounter: Payer: Self-pay | Admitting: Family Medicine

## 2020-08-06 ENCOUNTER — Other Ambulatory Visit: Payer: Self-pay | Admitting: Pharmacy Technician

## 2020-08-06 ENCOUNTER — Other Ambulatory Visit: Payer: Self-pay

## 2020-08-06 ENCOUNTER — Ambulatory Visit (INDEPENDENT_AMBULATORY_CARE_PROVIDER_SITE_OTHER): Payer: Medicare HMO | Admitting: Family Medicine

## 2020-08-06 VITALS — BP 126/70 | HR 81 | Temp 97.1°F | Ht 66.25 in | Wt 156.5 lb

## 2020-08-06 DIAGNOSIS — J849 Interstitial pulmonary disease, unspecified: Secondary | ICD-10-CM | POA: Diagnosis not present

## 2020-08-06 DIAGNOSIS — E1140 Type 2 diabetes mellitus with diabetic neuropathy, unspecified: Secondary | ICD-10-CM

## 2020-08-06 DIAGNOSIS — Z794 Long term (current) use of insulin: Secondary | ICD-10-CM

## 2020-08-06 DIAGNOSIS — M2550 Pain in unspecified joint: Secondary | ICD-10-CM | POA: Diagnosis not present

## 2020-08-06 DIAGNOSIS — E7800 Pure hypercholesterolemia, unspecified: Secondary | ICD-10-CM | POA: Diagnosis not present

## 2020-08-06 DIAGNOSIS — E1142 Type 2 diabetes mellitus with diabetic polyneuropathy: Secondary | ICD-10-CM | POA: Diagnosis not present

## 2020-08-06 DIAGNOSIS — M1009 Idiopathic gout, multiple sites: Secondary | ICD-10-CM | POA: Diagnosis not present

## 2020-08-06 DIAGNOSIS — I10 Essential (primary) hypertension: Secondary | ICD-10-CM | POA: Diagnosis not present

## 2020-08-06 MED ORDER — MELOXICAM 7.5 MG PO TABS: 7.5000 mg | ORAL_TABLET | Freq: Every day | ORAL | 0 refills | Status: DC

## 2020-08-06 MED ORDER — ONDANSETRON HCL 4 MG PO TABS
4.0000 mg | ORAL_TABLET | Freq: Every day | ORAL | 1 refills | Status: DC | PRN
Start: 2020-08-06 — End: 2021-02-08

## 2020-08-06 NOTE — Progress Notes (Signed)
Chief Complaint  Patient presents with  . Follow-up    Here for 6 mo f/u.    History of Present Illness: HPI    79 year old male presents for 6 month follow up DM  Diabetes:  At goal.. but not eating much.  Lab Results  Component Value Date   HGBA1C 7.1 (H) 08/03/2020  Using medications without difficulties: Hypoglycemic episodes: Hyperglycemic episodes: Feet problems: Blood Sugars averaging: eye exam within last year:   Gout Uric acid normal. Nor recent flares.   He has been having bilaterally hip pain and hand pain, Bilateral wrist pain... No redness, no swelling in joints. Using ibuprofen helps tempoarrily.  Has noted more muscle joint pain since increasing the gabapentin.. but this has stopped nighttime cough.   Low sodium:  Elevated Cholesterol:   On zetia.[name] but has stopped on simvastatin  Lab Results  Component Value Date   CHOL 124 08/03/2020   HDL 32.80 (L) 08/03/2020   LDLCALC 72 08/03/2020   LDLDIRECT 91.0 05/26/2020   TRIG 97.0 08/03/2020   CHOLHDL 4 08/03/2020  Using medications without problems: Muscle aches:  Diet compliance: good Exercise: minimal Other complaints:  Several very irritated lesions on scalp... will treat with cryotherapy.   This visit occurred during the SARS-CoV-2 public health emergency.  Safety protocols were in place, including screening questions prior to the visit, additional usage of staff PPE, and extensive cleaning of exam room while observing appropriate contact time as indicated for disinfecting solutions.   COVID 19 screen:  No recent travel or known exposure to COVID19 The patient denies respiratory symptoms of COVID 19 at this time. The importance of social distancing was discussed today.     Review of Systems  Constitutional: Negative for chills and fever.  HENT: Negative for congestion and ear pain.   Eyes: Negative for pain and redness.  Respiratory: Positive for cough and shortness of breath.    Cardiovascular: Negative for chest pain, palpitations and leg swelling.  Gastrointestinal: Negative for abdominal pain, blood in stool, constipation, diarrhea, nausea and vomiting.  Genitourinary: Negative for dysuria.  Musculoskeletal: Positive for joint pain. Negative for falls and myalgias.  Skin: Negative for rash.  Neurological: Negative for dizziness.  Psychiatric/Behavioral: Positive for depression. The patient has insomnia. The patient is not nervous/anxious.       Past Medical History:  Diagnosis Date  . Adenomatous colon polyp   . Allergy   . Arthritis   . Cataract   . Complication of anesthesia    Per pt, hard to wake up past last colon in 2011!  . Diabetes mellitus   . Esophageal stricture   . GERD (gastroesophageal reflux disease)   . History of colon polyps 922/2011  . Hyperlipidemia   . IBS (irritable bowel syndrome)   . Pulmonary fibrosis ([place])     reports that he has never smoked. He has never used smokeless tobacco. He reports that he does not drink alcohol and does not use drugs.   Current Outpatient Medications:  .  ACCU-CHEK FASTCLIX LANCETS MISC, 1 Device by Other route 3 (three) times daily. Check blood sugars 2 to 3 times daily.  Dx: 250.02, Disp: 102 each, Rfl: 5 .  ACCU-CHEK SMARTVIEW test strip, TEST BLOOD SUGAR 2 TO 3 TIMES DAILY, Disp: 300 strip, Rfl: 3 .  aspirin 81 MG tablet, Take 81 mg by mouth daily., Disp: , Rfl:  .  BD INSULIN SYRINGE U/F 31G X 5/16" 1 ML MISC, USE TO INJECT LANTUS  INSULIN 2 TIMES DAILY, Disp: 180 each, Rfl: 3 .  benzonatate (TESSALON) 100 MG capsule, TAKE 2 CAPSULES BY MOUTH 3 TIMES DAILY, Disp: 180 capsule, Rfl: 3 .  Blood Glucose Monitoring Suppl (ACCU-CHEK NANO SMARTVIEW) W/DEVICE KIT, 1 kit by Other route 3 (three) times daily. Use to check blood sugars 2 to 3 times daily.  Dx: 250.02, Disp: 1 kit, Rfl: 0 .  Budeson-Glycopyrrol-Formoterol (BREZTRI AEROSPHERE) 160-9-4.8 MCG/ACT AERO, Inhale 2 puffs into the lungs daily.,  Disp: 5 g, Rfl: 0 .  Budeson-Glycopyrrol-Formoterol (BREZTRI AEROSPHERE) 160-9-4.8 MCG/ACT AERO, Inhale 2 puffs into the lungs in the morning and at bedtime., Disp: 10.7 g, Rfl: 11 .  cholestyramine light (PREVALITE) 4 g packet, Take 1 packet (4 g total) by mouth 2 (two) times daily as needed (Diarrhea)., Disp: 60 each, Rfl: 3 .  ezetimibe (ZETIA) 10 MG tablet, TAKE 1 TABLET BY MOUTH DAILY, Disp: 90 tablet, Rfl: 3 .  fenofibrate 160 MG tablet, Take 1 tablet (160 mg total) by mouth daily., Disp: 90 tablet, Rfl: 1 .  gabapentin (NEURONTIN) 300 MG capsule, Take 1 capsule (300 mg total) by mouth at bedtime., Disp: 90 capsule, Rfl: 1 .  glipiZIDE (GLUCOTROL) 10 MG tablet, TAKE ONE TABLET BY MOUTH TWICE A DAY BEFORE A MEAL, Disp: 60 tablet, Rfl: 11 .  hydrOXYzine (VISTARIL) 25 MG capsule, Take 1 capsule (25 mg total) by mouth 3 (three) times daily as needed for anxiety or nausea., Disp: 30 capsule, Rfl: 0 .  insulin glargine (LANTUS) 100 UNIT/ML injection, Inject 70 Units into the skin daily., Disp: , Rfl:  .  MITIGARE 0.6 MG CAPS, FOR FLARE TAKE 2 TABLETS ONCE THEN REPEAT 1 TABLET IN 2 HOURS IF PAINCONTINUES, THEN 1 TABLET DAILY, Disp: 60 capsule, Rfl: 0 .  Multiple Vitamin (MULTIVITAMIN) tablet, Take 1 tablet by mouth daily.  , Disp: , Rfl:  .  Nintedanib (OFEV) 150 MG CAPS, Take 150 mg by mouth 2 (two) times daily., Disp: , Rfl:  .  omeprazole (PRILOSEC) 20 MG capsule, TAKE 1 CAPSULE EVERY DAY, Disp: 90 capsule, Rfl: 3 .  ondansetron (ZOFRAN) 4 MG tablet, Take 1 tablet (4 mg total) by mouth daily as needed for nausea or vomiting., Disp: 20 tablet, Rfl: 1 .  simvastatin (ZOCOR) 40 MG tablet, TAKE 1 TABLET AT BEDTIME, Disp: 90 tablet, Rfl: 0 .  tamsulosin (FLOMAX) 0.4 MG CAPS capsule, TAKE 1 CAPSULE BY MOUTH DAILY. IF NOT IMPROVING, INCREASE TO 2 TABLETS DAILY, Disp: 60 capsule, Rfl: 5   Observations/Objective: Blood pressure 126/70, pulse 81, temperature (!) 97.1 F (36.2 C), temperature source  Temporal, height 5' 6.25" (1.683 m), weight 156 lb 8 oz (71 kg), SpO2 98 %.  Physical Exam Constitutional:      Appearance: He is well-developed.  HENT:     Head: Normocephalic.     Right Ear: Hearing normal.     Left Ear: Hearing normal.     Nose: Nose normal.  Neck:     Thyroid: No thyroid mass or thyromegaly.     Vascular: No carotid bruit.     Trachea: Trachea normal.  Cardiovascular:     Rate and Rhythm: Normal rate and regular rhythm.     Pulses: Normal pulses.     Heart sounds: Heart sounds not distant. No murmur heard.  No friction rub. No gallop.      Comments: No peripheral edema Pulmonary:     Effort: Pulmonary effort is normal. No respiratory distress.     Breath  sounds: Normal breath sounds. Decreased air movement present.  Skin:    General: Skin is warm and dry.     Findings: No rash.  Psychiatric:        Mood and Affect: Mood is depressed.        Speech: Speech normal.        Behavior: Behavior normal.        Thought Content: Thought content normal.      Assessment and Plan Diffuse Parynchymal Lung Disease  Can use ondansetron daily to take lung medication.  Can stop benzonatate if not really helping with cough.  Gabapentin was helpful with nighttime cough but SE of body ache put weighs benefit.. reduce to previous dose.   Diabetic polyneuropathy associated with type 2 diabetes mellitus (HCC) Decrease back to previous dose of gabapentin given side effects.  Idiopathic gout of multiple sites No recent flares.  ESSENTIAL HYPERTENSION, BENIGN Well controlled. Continue current medication.   Type 2 diabetes mellitus with diabetic neuropathy, with long-term current use of insulin (HCC) Tolerable control.  Joint pain Start topical over the counter Voltaren cream on hands 4 times daily. Start Meloxicam low dose for arthritis.   HYPERCHOLESTEROLEMIA On zetia.[name] but has stopped on simvastatin       [name], MD

## 2020-08-06 NOTE — Patient Instructions (Addendum)
Can use ondansetron daily to take lung medication.  Can stop benzonatate if not really helping with cough.  Decrease back to previous dose of gabapentin given weakness and fatigue... 100-200 mg at night.  Stop ibuprofen as it could be causing stomach upset  Start topical over the counter Voltaren cream on hands 4 times daily. Start Meloxicam low dose for arthritis.

## 2020-08-06 NOTE — Patient Outreach (Signed)
[place] [facility]) Care Management  08/06/2020  VAUN HYNDMAN 10/11/1941 [id number]  Care coordination call placed to [employer] in regards to delivery status of patient's Breztri.  Spoke to [place] who informed the request has been processed and is ready for shipment though it has not shipped yet. She informs it should ship today or tomorrow with an expected delivery to the patient's home in the 5-7 business days.  She also informed inhalers are normally shipped USPS. Inquired if a patient normally receives mail thru a PO Box would USPS still take this delivery to the patient's home. [facility] informed they would.  Will outreach patient with this information.  Evangelia Whitaker P. Michayla Mcneil, [place]  [phone]

## 2020-08-06 NOTE — Patient Outreach (Signed)
[place] [facility]) Care Management  08/06/2020  Brian Bass 1941/02/07 [id number]  ADDENDUM  Unsuccessful outreach call placed to patient in regards to delivery status update of Breztri from [employer].  Unfortunately patient did not answer the call, HIPAA compliant voicemail was left.  Was calling to provide a delivery update to patient.  Will follow up as previously scheduled.  Janelle Spellman P. Fabiana Dromgoole, [place]  [phone]

## 2020-08-11 NOTE — Telephone Encounter (Signed)
[employer] application denied because patient has not met his 2% spread down.  Pt needs to meet $620 and has met $467.91.  Recertification can be requested again in 30 days.  Left message for [name] with this information.

## 2020-08-13 ENCOUNTER — Other Ambulatory Visit: Payer: Self-pay | Admitting: Pharmacy Technician

## 2020-08-13 NOTE — Patient Outreach (Signed)
[facility] [facility]) Care Management  08/13/2020  FELIZ LINCOLN 13-Jun-1941 [id number]  Care coordination call placed to [employer] in regards to the shipment status of patient's Breztri inhaler.  Spoke to [place] who informed that the medication was shipped on 08/07/2020 for a 90 days supply. She informed patient was set up for auto shipment which means 60 days from last fill date, [employer] will begin processing another request up until the end of the enrollment period which is 12/04/2020. Patient will then need to reapply.  She informed the medication has an estimated delivery date of Saturday, August 15, 2020 by 7pm. She informed the tracking number is [id number] by [place].  Will follow up with patient in 5-7 business days to inquire if mediation was received.  Ariannah Arenson P. Kourtnee Lahey, [place]  [phone]

## 2020-08-17 ENCOUNTER — Other Ambulatory Visit: Payer: Self-pay | Admitting: Family Medicine

## 2020-08-19 DIAGNOSIS — J849 Interstitial pulmonary disease, unspecified: Secondary | ICD-10-CM | POA: Diagnosis not present

## 2020-08-20 ENCOUNTER — Other Ambulatory Visit: Payer: Self-pay | Admitting: Pharmacy Technician

## 2020-08-20 NOTE — Patient Outreach (Signed)
[place] [facility]) Care Management  08/20/2020  IFEOLUWA BELLER 1940/12/14 [id number]   Unsuccessful call placed to patient regarding patient assistance medication delivery of Breztri from [employer], HIPAA compliant voicemail left on home and mobile numbers..   Was calling to inquire if patient received the [name] that was scheduled to be delivered on 08/15/2020.   Follow up:  Will follow with patient in 2-3 business days if call is not returned.  Nikki Glanzer P. Rudie Rikard, [place]  [phone]

## 2020-08-23 DIAGNOSIS — K449 Diaphragmatic hernia without obstruction or gangrene: Secondary | ICD-10-CM | POA: Diagnosis not present

## 2020-08-23 DIAGNOSIS — K5730 Diverticulosis of large intestine without perforation or abscess without bleeding: Secondary | ICD-10-CM | POA: Diagnosis not present

## 2020-08-23 DIAGNOSIS — K5900 Constipation, unspecified: Secondary | ICD-10-CM | POA: Diagnosis not present

## 2020-08-24 ENCOUNTER — Other Ambulatory Visit: Payer: Self-pay | Admitting: Pharmacy Technician

## 2020-08-24 NOTE — Patient Outreach (Signed)
[place] [facility]) Care Management  08/24/2020  Brian Bass March 02, 1941 [id number]   Second unsuccessful outreach call placed to patient regarding patient assistance medication delivery of Breztri from [employer], HIPAA compliant voicemail left.  Was calling to inquire if patient received the [name] that was scheduled to be delivered on 08/15/2020.  UPDATE: patient contacted [place] who provided patient with the necessary information to check on the delivery status. According to the tracking umber, then medication is at the [facility] postal office awaiting pick up from patient.  Will close patient assistance case as the workflow has been completed. [facility] Jaclyn Shag[facility] Summe has been informed.  [name], [place]  [phone]

## 2020-08-31 ENCOUNTER — Telehealth: Payer: Self-pay | Admitting: Pulmonary Disease

## 2020-08-31 NOTE — Telephone Encounter (Signed)
Lm for patient.

## 2020-09-01 NOTE — Telephone Encounter (Signed)
Called and spoke to patient's spouse, [name] [facility]). [name] is requesting Rx for adjustable bed to be sent [facility]. Per [name], patients cough worsens when laying flat.   Dr. Patsey Berthold, please advise. Thanks

## 2020-09-02 ENCOUNTER — Telehealth: Payer: Self-pay | Admitting: Family Medicine

## 2020-09-02 DIAGNOSIS — R0602 Shortness of breath: Secondary | ICD-10-CM

## 2020-09-02 DIAGNOSIS — J849 Interstitial pulmonary disease, unspecified: Secondary | ICD-10-CM

## 2020-09-02 NOTE — Telephone Encounter (Signed)
Usually adjustable beds are ordered by primary MD.  I would try a wedge pillow first.  If that does not work then primary MD may be able to order adjustable bed.

## 2020-09-02 NOTE — Telephone Encounter (Signed)
Patients spouse, [name] [facility]) is aware of below message and voiced her understanding.  Nothing further needed.

## 2020-09-02 NOTE — Telephone Encounter (Signed)
[name] returning a phone call. [name] can be reached at [phone].

## 2020-09-02 NOTE — Telephone Encounter (Signed)
Lm for patient's spouse, [name] [facility]).

## 2020-09-02 NOTE — Telephone Encounter (Signed)
Spouse wanted to know if dr Diona Browner will write a rx for portable bed  pulm won't do this referred back to PCP

## 2020-09-03 ENCOUNTER — Telehealth: Payer: Self-pay | Admitting: Pulmonary Disease

## 2020-09-03 DIAGNOSIS — J84112 Idiopathic pulmonary fibrosis: Secondary | ICD-10-CM

## 2020-09-03 NOTE — Telephone Encounter (Signed)
Pt called checking on portable bed She stated this needs to go to [facility] Needs to say all electric bed  And also needs dx with rx  Please advise when this has been done  Fax # [phone]

## 2020-09-03 NOTE — Telephone Encounter (Signed)
Lm for patient's spouse, [name] [facility]).

## 2020-09-03 NOTE — Telephone Encounter (Signed)
Pt wife returning missed call. [phone]

## 2020-09-03 NOTE — Telephone Encounter (Signed)
Lm for patient's spouse, [name] (DPR).  

## 2020-09-04 NOTE — Telephone Encounter (Signed)
Completed DME rx.. printed. Let patient know.   I was not told but assumed needs elevation of head due to shortness of breath and not able to lay flat.

## 2020-09-04 NOTE — Telephone Encounter (Signed)
Spoke to patient's spouse, [name] [facility]). [name] is requesting for an order to be placed to [facility] for POC, as [facility] does not currently have POC.  Order has been placed to [facility] as requested by [name].  Nothing further needed.

## 2020-09-04 NOTE — Telephone Encounter (Signed)
Pt returning call.  [phone]

## 2020-09-07 NOTE — Telephone Encounter (Signed)
[facility] notified of order via [employer] in [place].  Mrs. Axel notified via telephone.

## 2020-09-08 ENCOUNTER — Telehealth: Payer: Self-pay | Admitting: Pulmonary Disease

## 2020-09-08 DIAGNOSIS — J84112 Idiopathic pulmonary fibrosis: Secondary | ICD-10-CM

## 2020-09-08 NOTE — Telephone Encounter (Signed)
Order has been placed to [facility] for POC per [name]'s request.  [name] is aware and voiced her understanding.  Nothing further needed.

## 2020-09-09 DIAGNOSIS — M2550 Pain in unspecified joint: Secondary | ICD-10-CM | POA: Insufficient documentation

## 2020-09-09 NOTE — Assessment & Plan Note (Signed)
No recent flares.

## 2020-09-09 NOTE — Assessment & Plan Note (Signed)
Start topical over the counter Voltaren cream on hands 4 times daily. Start Meloxicam low dose for arthritis.

## 2020-09-09 NOTE — Assessment & Plan Note (Signed)
Well controlled. Continue current medication.  

## 2020-09-09 NOTE — Assessment & Plan Note (Signed)
Tolerable control.

## 2020-09-09 NOTE — Assessment & Plan Note (Signed)
Decrease back to previous dose of gabapentin given side effects.

## 2020-09-09 NOTE — Assessment & Plan Note (Addendum)
Can use ondansetron daily to take lung medication.  Can stop benzonatate if not really helping with cough.  Gabapentin was helpful with nighttime cough but SE of body ache put weighs benefit.. reduce to previous dose.

## 2020-09-09 NOTE — Assessment & Plan Note (Signed)
On zetia.[name] but has stopped on simvastatin

## 2020-09-10 ENCOUNTER — Other Ambulatory Visit: Payer: Self-pay

## 2020-09-10 NOTE — Patient Outreach (Signed)
[place] [facility]) Care Management  09/10/2020  Brian Bass Nov 16, 1941 [id number]   Telephone Assessment    Multiple attempts to establish contact with patient without success over the past year.    Plan: RN CM will close case at this time.   Enzo Montgomery, RN,BSN,CCM [place] Management Telephonic Care Management Coordinator Direct Phone: [phone] Toll Free: [phone] Fax: [phone]

## 2020-09-15 ENCOUNTER — Telehealth: Payer: Self-pay | Admitting: Pulmonary Disease

## 2020-09-15 MED ORDER — GABAPENTIN 100 MG PO CAPS: 100.0000 mg | ORAL_CAPSULE | Freq: Every day | ORAL | 2 refills | Status: DC

## 2020-09-15 NOTE — Telephone Encounter (Signed)
Okay to decrease gabapentin to 100 mg p.o. at bedtime

## 2020-09-15 NOTE — Telephone Encounter (Signed)
Spoke's spouse, [name] [facility]) is aware of below message and voiced her understanding.  Rx for Gabapentin 14m has been sent to preferred pharmacy.  Nothing further needed.

## 2020-09-15 NOTE — Telephone Encounter (Signed)
Pt wife returning missed call. Can be reached at [phone]

## 2020-09-15 NOTE — Telephone Encounter (Signed)
Lm for patient's spouse, [name] (DPR).  

## 2020-09-15 NOTE — Telephone Encounter (Signed)
Spoke to patient's spouse, Brian Bass [facility]). Brian Bass is requesting to switch back to Gabapentin 120m, as she feels that 3070mis too strong for patient and is causing him to have body aches.   Dr. GoPatsey Bertholdlease advise. Thanks

## 2020-09-16 ENCOUNTER — Telehealth: Payer: Self-pay | Admitting: Pulmonary Disease

## 2020-09-16 NOTE — Telephone Encounter (Signed)
I have spoke with Brian Bass with [facility] and explained that the patient was a new 02 start on 02/13/2020 with [place]. New order placed on 09/08/20 for POC because patient had not gotten one. Now [facility] states patient needs to be evaluated for POC. [name] is going to check on this and let me know

## 2020-09-16 NOTE — Telephone Encounter (Signed)
Spoke to patient's spouse, [name](DPR). Per [name], [facility] is requesting order for POC. On 09/08/2020 a order was placed to [facility] for POC.   Rodena Piety, can you help with this? thanks

## 2020-09-18 DIAGNOSIS — J849 Interstitial pulmonary disease, unspecified: Secondary | ICD-10-CM | POA: Diagnosis not present

## 2020-09-25 DIAGNOSIS — J849 Interstitial pulmonary disease, unspecified: Secondary | ICD-10-CM | POA: Diagnosis not present

## 2020-09-25 DIAGNOSIS — R0602 Shortness of breath: Secondary | ICD-10-CM | POA: Diagnosis not present

## 2020-09-26 ENCOUNTER — Ambulatory Visit: Payer: Medicare HMO

## 2020-09-29 NOTE — Telephone Encounter (Signed)
Rodena Piety, please advise if there is an update on this. Thanks.

## 2020-09-30 ENCOUNTER — Ambulatory Visit (INDEPENDENT_AMBULATORY_CARE_PROVIDER_SITE_OTHER): Payer: Medicare HMO

## 2020-09-30 ENCOUNTER — Other Ambulatory Visit: Payer: Self-pay

## 2020-09-30 DIAGNOSIS — Z23 Encounter for immunization: Secondary | ICD-10-CM

## 2020-10-05 ENCOUNTER — Other Ambulatory Visit: Payer: Self-pay | Admitting: Family Medicine

## 2020-10-09 NOTE — Telephone Encounter (Signed)
Brian Bass, please advise. Thanks

## 2020-10-13 NOTE — Telephone Encounter (Signed)
Per [name] with [facility] the patient was scheduled to for 11/9 to do the [employer] POC Eval and when he got there he was on 2 liters continuous and he declined doing the [employer] because what they had to offer him was not small enough. Melissa didn't understand why he didn't just complete the [employer] while he was there

## 2020-10-14 NOTE — Telephone Encounter (Signed)
Noted.  Will close encounter.

## 2020-10-19 DIAGNOSIS — J849 Interstitial pulmonary disease, unspecified: Secondary | ICD-10-CM | POA: Diagnosis not present

## 2020-10-26 DIAGNOSIS — J849 Interstitial pulmonary disease, unspecified: Secondary | ICD-10-CM | POA: Diagnosis not present

## 2020-10-26 DIAGNOSIS — R0602 Shortness of breath: Secondary | ICD-10-CM | POA: Diagnosis not present

## 2020-11-02 ENCOUNTER — Other Ambulatory Visit: Payer: Self-pay | Admitting: *Deleted

## 2020-11-02 MED ORDER — SIMVASTATIN 40 MG PO TABS
40.0000 mg | ORAL_TABLET | Freq: Every day | ORAL | 2 refills | Status: DC
Start: 2020-11-02 — End: 2021-11-02

## 2020-11-04 ENCOUNTER — Ambulatory Visit: Payer: Self-pay

## 2020-11-04 NOTE — Chronic Care Management (AMB) (Signed)
Coordinate patient assistance renewal - Breztri and Ofev. Forms will be placed in [facility] office for patient to complete during [place] on 11/13/20. Reminded patient to bring social security income statement.   Debbora Dus, PharmD Clinical Pharmacist [facility] [phone]

## 2020-11-06 ENCOUNTER — Telehealth: Payer: Self-pay | Admitting: Family Medicine

## 2020-11-06 DIAGNOSIS — E118 Type 2 diabetes mellitus with unspecified complications: Secondary | ICD-10-CM

## 2020-11-06 NOTE — Telephone Encounter (Signed)
-----  Message from Cloyd Stagers, RT sent at 10/27/2020  7:51 AM EST ----- Regarding: Lab Orders for Monday 12.6.2021 Please place lab orders for Monday 12.6.2021, appt notes state "3 month f/u labs" Thank you, Dyke Maes RT(R)

## 2020-11-09 ENCOUNTER — Telehealth: Payer: Self-pay | Admitting: Family Medicine

## 2020-11-09 ENCOUNTER — Other Ambulatory Visit (INDEPENDENT_AMBULATORY_CARE_PROVIDER_SITE_OTHER): Payer: Medicare HMO

## 2020-11-09 ENCOUNTER — Other Ambulatory Visit: Payer: Self-pay

## 2020-11-09 DIAGNOSIS — Z794 Long term (current) use of insulin: Secondary | ICD-10-CM | POA: Diagnosis not present

## 2020-11-09 DIAGNOSIS — E118 Type 2 diabetes mellitus with unspecified complications: Secondary | ICD-10-CM

## 2020-11-09 LAB — COMPREHENSIVE METABOLIC PANEL
ALT: 24 U/L (ref 0–53)
AST: 20 U/L (ref 0–37)
Albumin: 4 g/dL (ref 3.5–5.2)
Alkaline Phosphatase: 127 U/L — ABNORMAL HIGH (ref 39–117)
BUN: 11 mg/dL (ref 6–23)
CO2: 27 mEq/L (ref 19–32)
Calcium: 9.3 mg/dL (ref 8.4–10.5)
Chloride: 96 mEq/L (ref 96–112)
Creatinine, Ser: 1.05 mg/dL (ref 0.40–1.50)
GFR: 67.1 mL/min (ref >60.00)
Glucose, Bld: 89 mg/dL (ref 70–99)
Potassium: 4.4 mEq/L (ref 3.5–5.1)
Sodium: 130 mEq/L — ABNORMAL LOW (ref 135–145)
Total Bilirubin: 0.6 mg/dL (ref 0.2–1.2)
Total Protein: 7.7 g/dL (ref 6.0–8.3)

## 2020-11-09 LAB — HEMOGLOBIN A1C: Hgb A1c MFr Bld: 7.6 % — ABNORMAL HIGH (ref 4.6–6.5)

## 2020-11-09 NOTE — Chronic Care Management (AMB) (Signed)
  Chronic Care Management   Outreach Note  11/09/2020 Name: Brian Bass MRN: [id number] DOB: 05/08/1941  Referred by: Jinny Sanders, MD Reason for referral : Chronic Care Management   An unsuccessful telephone outreach was attempted today. The patient was referred to the pharmacist for assistance with care management and care coordination.   Follow Up Plan:   Brian Bass  [facility] Scheduler

## 2020-11-09 NOTE — Progress Notes (Signed)
  Chronic Care Management   Note  11/09/2020 Name: LEONIDUS ROWAND MRN: [id number] DOB: 01-Jan-1941  Janine Ores Panos is a 79 y.o. year old male who is a primary care patient of Bedsole, Amy E, MD. I reached out to [employer] by phone today in response to a referral sent by Mr. Janine Ores Stinger's PCP, Jinny Sanders, MD.   Mr. Mcclanahan was given information about Chronic Care Management services today including:  1. CCM service includes personalized support from designated clinical staff supervised by his physician, including individualized plan of care and coordination with other care providers 2. 24/7 contact phone numbers for assistance for urgent and routine care needs. 3. Service will only be billed when office clinical staff spend 20 minutes or more in a month to coordinate care. 4. Only one practitioner may furnish and bill the service in a calendar month. 5. The patient may stop CCM services at any time (effective at the end of the month) by phone call to the office staff.   Patient wishes to consider information provided and/or speak with a member of the care team before deciding about enrollment in care management services.   Follow up plan:   [place]

## 2020-11-10 NOTE — Progress Notes (Signed)
No critical labs need to be addressed urgently. We will discuss labs in detail at upcoming office visit.   

## 2020-11-12 DIAGNOSIS — E119 Type 2 diabetes mellitus without complications: Secondary | ICD-10-CM | POA: Diagnosis not present

## 2020-11-12 DIAGNOSIS — Z0100 Encounter for examination of eyes and vision without abnormal findings: Secondary | ICD-10-CM | POA: Diagnosis not present

## 2020-11-12 LAB — HM DIABETES EYE EXAM

## 2020-11-13 ENCOUNTER — Telehealth: Payer: Self-pay

## 2020-11-13 ENCOUNTER — Ambulatory Visit (INDEPENDENT_AMBULATORY_CARE_PROVIDER_SITE_OTHER): Payer: Medicare HMO | Admitting: Family Medicine

## 2020-11-13 ENCOUNTER — Encounter: Payer: Self-pay | Admitting: Family Medicine

## 2020-11-13 VITALS — BP 122/80 | HR 92 | Temp 97.6°F | Ht 66.0 in | Wt 151.0 lb

## 2020-11-13 DIAGNOSIS — E1140 Type 2 diabetes mellitus with diabetic neuropathy, unspecified: Secondary | ICD-10-CM | POA: Diagnosis not present

## 2020-11-13 DIAGNOSIS — M1009 Idiopathic gout, multiple sites: Secondary | ICD-10-CM

## 2020-11-13 DIAGNOSIS — I152 Hypertension secondary to endocrine disorders: Secondary | ICD-10-CM | POA: Diagnosis not present

## 2020-11-13 DIAGNOSIS — E1142 Type 2 diabetes mellitus with diabetic polyneuropathy: Secondary | ICD-10-CM

## 2020-11-13 DIAGNOSIS — E1159 Type 2 diabetes mellitus with other circulatory complications: Secondary | ICD-10-CM

## 2020-11-13 DIAGNOSIS — E785 Hyperlipidemia, unspecified: Secondary | ICD-10-CM | POA: Diagnosis not present

## 2020-11-13 DIAGNOSIS — E1169 Type 2 diabetes mellitus with other specified complication: Secondary | ICD-10-CM

## 2020-11-13 DIAGNOSIS — Z794 Long term (current) use of insulin: Secondary | ICD-10-CM

## 2020-11-13 MED ORDER — MITIGARE 0.6 MG PO CAPS
ORAL_CAPSULE | ORAL | 0 refills | Status: DC
Start: 2020-11-13 — End: 2021-05-26

## 2020-11-13 NOTE — Assessment & Plan Note (Signed)
Stable, chronic.  Continue current medication.

## 2020-11-13 NOTE — Assessment & Plan Note (Signed)
Chronic, He is intermittently using atorvastatin, he is using zetia daily. Encouraged compliance and consistency as no SE to the meds. We will check how LDL is controlled in 3 months on this regimen.

## 2020-11-13 NOTE — Assessment & Plan Note (Signed)
Chronic, Stable control gabapentin is helping more with couigh than anything else./

## 2020-11-13 NOTE — Assessment & Plan Note (Signed)
Refilled mitigare to use as needed.

## 2020-11-13 NOTE — Chronic Care Management (AMB) (Addendum)
Chronic Care Management Pharmacy Assistant   Name: Brian Bass  MRN: [id number] DOB: 11-May-1941  Reason for Encounter: Patient Assistance Renewal   PCP : Brian Sanders, MD  Allergies:  No Known Allergies  Medications: Outpatient Encounter Medications as of 11/13/2020  Medication Sig   ACCU-CHEK FASTCLIX LANCETS MISC 1 Device by Other route 3 (three) times daily. Check blood sugars 2 to 3 times daily.  Dx: 250.02   ACCU-CHEK SMARTVIEW test strip TEST BLOOD SUGAR 2 TO 3 TIMES DAILY   aspirin 81 MG tablet Take 81 mg by mouth daily. (Patient not taking: Reported on 11/13/2020)   BD INSULIN SYRINGE U/F 31G X 5/16" 1 ML MISC USE TO INJECT LANTUS INSULIN 2 TIMES DAILY   benzonatate (TESSALON) 100 MG capsule TAKE 2 CAPSULES BY MOUTH 3 TIMES DAILY   Blood Glucose Monitoring Suppl (ACCU-CHEK NANO SMARTVIEW) W/DEVICE KIT 1 kit by Other route 3 (three) times daily. Use to check blood sugars 2 to 3 times daily.  Dx: 250.02   Budeson-Glycopyrrol-Formoterol (BREZTRI AEROSPHERE) 160-9-4.8 MCG/ACT AERO Inhale 2 puffs into the lungs daily.   Budeson-Glycopyrrol-Formoterol (BREZTRI AEROSPHERE) 160-9-4.8 MCG/ACT AERO Inhale 2 puffs into the lungs in the morning and at bedtime.   cholestyramine light (PREVALITE) 4 g packet Take 1 packet (4 g total) by mouth 2 (two) times daily as needed (Diarrhea).   ezetimibe (ZETIA) 10 MG tablet TAKE 1 TABLET BY MOUTH DAILY   fenofibrate 160 MG tablet Take 1 tablet (160 mg total) by mouth daily.   gabapentin (NEURONTIN) 100 MG capsule Take 1 capsule (100 mg total) by mouth at bedtime.   glipiZIDE (GLUCOTROL) 10 MG tablet TAKE ONE TABLET BY MOUTH TWICE A DAY BEFORE A MEAL   hydrOXYzine (VISTARIL) 25 MG capsule Take 1 capsule (25 mg total) by mouth 3 (three) times daily as needed for anxiety or nausea.   LANTUS 100 UNIT/ML injection INJECT 55 UNITS SQ TWICE A DAY   meloxicam (MOBIC) 7.5 MG tablet Take 1 tablet (7.5 mg total) by mouth daily.   MITIGARE 0.6 MG  CAPS FOR FLARE TAKE 2 TABLETS ONCE THEN REPEAT 1 TABLET IN 2 HOURS IF PAINCONTINUES, THEN 1 TABLET DAILY   Multiple Vitamin (MULTIVITAMIN) tablet Take 1 tablet by mouth daily.   Nintedanib (OFEV) 150 MG CAPS Take 150 mg by mouth 2 (two) times daily.   omeprazole (PRILOSEC) 20 MG capsule TAKE 1 CAPSULE EVERY DAY   ondansetron (ZOFRAN) 4 MG tablet Take 1 tablet (4 mg total) by mouth daily as needed for nausea or vomiting.   simvastatin (ZOCOR) 40 MG tablet Take 1 tablet (40 mg total) by mouth at bedtime.   tamsulosin (FLOMAX) 0.4 MG CAPS capsule TAKE 1 CAPSULE EVERY DAY IF NOT IMPROVING INCREASE TO TAKE 2 CAPSULES EVERY DAY   No facility-administered encounter medications on file as of 11/13/2020.    Current Diagnosis: Patient Active Problem List   Diagnosis Date Noted   Joint pain 09/09/2020   Atherosclerosis of native coronary artery of native heart with stable angina pectoris (Brian Bass) 03/23/2019   Elevated PSA 01/29/2019   Diabetic polyneuropathy associated with type 2 diabetes mellitus (Brian Bass) 01/29/2019   Positive cardiac stress test 03/06/2018   DOE (dyspnea on exertion) 02/08/2018   Benign prostatic hyperplasia with nocturia 02/08/2018   Counseling regarding end of life decision making 07/14/2015   GERD (gastroesophageal reflux disease) 12/13/2013   Diffuse Parynchymal Lung Disease 11/08/2013   CKD (chronic kidney disease) stage 2, GFR 60-89 ml/min 09/06/2013  Idiopathic gout of multiple sites 12/03/2009   CAD (coronary artery disease) 11/02/2009   Type 2 diabetes mellitus with diabetic neuropathy, with long-term current use of insulin (Brian Bass) 08/26/2009   HYPERCHOLESTEROLEMIA 08/26/2009   ESSENTIAL HYPERTENSION, BENIGN 08/26/2009   OSTEOARTHRITIS, MULTIPLE JOINTS 08/26/2009   ESOPHAGEAL STRICTURE 10/14/2008   IRRITABLE BOWEL SYNDROME 09/02/2008   PERSONAL HX COLONIC POLYPS 09/02/2008   Contacted patient's wife, Brian Bass, regarding patient assistance forms for Brian Bass and Ofev she  picked up from Dr. Arley Phenix' office on 11/12/20. They will need to take the form to his pulmonologist - Dr. Adron Bene office for her signature. Their office is located at [address], [place] [address]. They will also need to take with proof of income such as tax statement or social security statement. Brian Bass stated that Brian Bass had an appointment scheduled in January with Dr. Domingo Dimes office and she would take it then. I explained it would be best to run by the office as soon as possible to ensure he does not run out of his medication.   Follow-Up:  Patient Assistance Coordination and Pharmacist Review   Debbora Dus, CPP notified  Margaretmary Dys, [facility] Pharmacy Assistant [phone]

## 2020-11-13 NOTE — Progress Notes (Signed)
Chief Complaint  Patient presents with  . Diabetes    History of Present Illness: HPI  79 year old male with severe progressive Interstitial lung disease presents for DM follow up.  He talked CCM pharmacist on 11/09/2020. Note reviewed    Now on OFEV for  ILD... followed by pulmonary.  Diabetes:   Tolerable control per A1C but pt reports frequent 200s in last few months at home  On Lantus 55 Units BID. He stopped glucotrol on his own. He states " this is the least of my worries, I am not worried about my sugar when I  Am coughing mey head off"  He continues to not have a great appetite. Lab Results  Component Value Date   HGBA1C 7.6 (H) 11/09/2020  Using medications without difficulties: Hypoglycemic episodes: Hyperglycemic episodes: Feet problems: Blood Sugars averaging: eye exam within last year:  Wt Readings from Last 3 Encounters:  11/13/20 151 lb (68.5 kg)  08/06/20 156 lb 8 oz (71 kg)  07/09/20 156 lb (70.8 kg)  Body mass index is 24.37 kg/m.  He is iuntermittently using atorvastatin given he is not worried about his cholesterol.   Joint pain: off statin. At last OV recommended voltaren cream on hands, low dose meloxicam prn.  Hypertension:   At goal on current regimen. BP Readings from Last 3 Encounters:  11/13/20 122/80  08/06/20 126/70  07/09/20 126/76  Using medication without problems or lightheadedness:  Chest pain with exertion: Edema: Short of breath: Average home BPs: Other issues:   This visit occurred during the SARS-CoV-2 public health emergency.  Safety protocols were in place, including screening questions prior to the visit, additional usage of staff PPE, and extensive cleaning of exam room while observing appropriate contact time as indicated for disinfecting solutions.   COVID 19 screen:  No recent travel or known exposure to COVID19 The patient denies respiratory symptoms of COVID 19 at this time. The importance of social distancing was  discussed today.    ROS    Current Outpatient Medications on File Prior to Visit  Medication Sig Dispense Refill  . ACCU-CHEK FASTCLIX LANCETS MISC 1 Device by Other route 3 (three) times daily. Check blood sugars 2 to 3 times daily.  Dx: 250.02 102 each 5  . ACCU-CHEK SMARTVIEW test strip TEST BLOOD SUGAR 2 TO 3 TIMES DAILY 300 strip 3  . BD INSULIN SYRINGE U/F 31G X 5/16" 1 ML MISC USE TO INJECT LANTUS INSULIN 2 TIMES DAILY 180 each 3  . benzonatate (TESSALON) 100 MG capsule TAKE 2 CAPSULES BY MOUTH 3 TIMES DAILY 180 capsule 3  . Blood Glucose Monitoring Suppl (ACCU-CHEK NANO SMARTVIEW) W/DEVICE KIT 1 kit by Other route 3 (three) times daily. Use to check blood sugars 2 to 3 times daily.  Dx: 250.02 1 kit 0  . Budeson-Glycopyrrol-Formoterol (BREZTRI AEROSPHERE) 160-9-4.8 MCG/ACT AERO Inhale 2 puffs into the lungs daily. 5 g 0  . Budeson-Glycopyrrol-Formoterol (BREZTRI AEROSPHERE) 160-9-4.8 MCG/ACT AERO Inhale 2 puffs into the lungs in the morning and at bedtime. 10.7 g 11  . cholestyramine light (PREVALITE) 4 g packet Take 1 packet (4 g total) by mouth 2 (two) times daily as needed (Diarrhea). 60 each 3  . ezetimibe (ZETIA) 10 MG tablet TAKE 1 TABLET BY MOUTH DAILY 90 tablet 3  . fenofibrate 160 MG tablet Take 1 tablet (160 mg total) by mouth daily. 90 tablet 1  . gabapentin (NEURONTIN) 100 MG capsule Take 1 capsule (100 mg total) by mouth at  bedtime. 30 capsule 2  . glipiZIDE (GLUCOTROL) 10 MG tablet TAKE ONE TABLET BY MOUTH TWICE A DAY BEFORE A MEAL 60 tablet 11  . hydrOXYzine (VISTARIL) 25 MG capsule Take 1 capsule (25 mg total) by mouth 3 (three) times daily as needed for anxiety or nausea. 30 capsule 0  . LANTUS 100 UNIT/ML injection INJECT 55 UNITS SQ TWICE A DAY 100 mL 0  . meloxicam (MOBIC) 7.5 MG tablet Take 1 tablet (7.5 mg total) by mouth daily. 30 tablet 0  . MITIGARE 0.6 MG CAPS FOR FLARE TAKE 2 TABLETS ONCE THEN REPEAT 1 TABLET IN 2 HOURS IF PAINCONTINUES, THEN 1 TABLET DAILY 60  capsule 0  . Multiple Vitamin (MULTIVITAMIN) tablet Take 1 tablet by mouth daily.    . Nintedanib (OFEV) 150 MG CAPS Take 150 mg by mouth 2 (two) times daily.    [name] omeprazole (PRILOSEC) 20 MG capsule TAKE 1 CAPSULE EVERY DAY 90 capsule 3  . ondansetron (ZOFRAN) 4 MG tablet Take 1 tablet (4 mg total) by mouth daily as needed for nausea or vomiting. 30 tablet 1  . simvastatin (ZOCOR) 40 MG tablet Take 1 tablet (40 mg total) by mouth at bedtime. 90 tablet 2  . tamsulosin (FLOMAX) 0.4 MG CAPS capsule TAKE 1 CAPSULE EVERY DAY IF NOT IMPROVING INCREASE TO TAKE 2 CAPSULES EVERY DAY 60 capsule 5  . aspirin 81 MG tablet Take 81 mg by mouth daily. (Patient not taking: Reported on 11/13/2020)     No current facility-administered medications on file prior to visit.    Past Medical History:  Diagnosis Date  . Adenomatous colon polyp   . Allergy   . Arthritis   . Cataract   . Complication of anesthesia    Per pt, hard to wake up past last colon in 2011!  . Diabetes mellitus   . Esophageal stricture   . GERD (gastroesophageal reflux disease)   . History of colon polyps 922/2011  . Hyperlipidemia   . IBS (irritable bowel syndrome)   . Pulmonary fibrosis ([place])     reports that he has never smoked. He has never used smokeless tobacco. He reports that he does not drink alcohol and does not use drugs.   Observations/Objective: Blood pressure 122/80, pulse 92, temperature 97.6 F (36.4 C), temperature source Oral, height _0  (1.676 m), weight 151 lb (68.5 kg), SpO2 95 %.  Physical Exam Constitutional:      Appearance: He is well-developed.  HENT:     Head: Normocephalic.     Right Ear: Hearing normal.     Left Ear: Hearing normal.     Nose: Nose normal.  Neck:     Thyroid: No thyroid mass or thyromegaly.     Vascular: No carotid bruit.     Trachea: Trachea normal.  Cardiovascular:     Rate and Rhythm: Normal rate and regular rhythm.     Pulses: Normal pulses.     Heart sounds: Heart  sounds not distant. No murmur heard. No friction rub. No gallop.      Comments: No peripheral edema Pulmonary:     Effort: Pulmonary effort is normal. No respiratory distress.     Breath sounds: Normal breath sounds. Decreased air movement present.  Skin:    General: Skin is warm and dry.     Findings: No rash.  Psychiatric:        Mood and Affect: Mood is depressed.        Speech: Speech normal.  Behavior: Behavior normal.        Thought Content: Thought content normal.      Assessment and Plan    Problem List Items Addressed This Visit    Diabetic polyneuropathy associated with type 2 diabetes mellitus (HCC) (Chronic)    Chronic, Stable control gabapentin is helping more with couigh than anything else./      Hyperlipidemia associated with type 2 diabetes mellitus (HCC) (Chronic)    Chronic, He is intermittently using atorvastatin, he is using zetia daily. Encouraged compliance and consistency as no SE to the meds. We will check how LDL is controlled in 3 months on this regimen.      Hypertension associated with diabetes ([place]) (Chronic)    Stable, chronic.  Continue current medication.         Idiopathic gout of multiple sites (Chronic)    Refilled mitigare to use as needed.      Type 2 diabetes mellitus with diabetic neuropathy, with long-term current use of insulin (HCC) - Primary (Chronic)    Chronic, worsening control.  He is not taking medication as instructed.[name] adjusts dose on prn basis. Stopped glucotrol.  Encouraged his to be more consistent with keeping up with nutrition and stable dosing of insulin, but I understand quality of life for him is most important and Dm is tolerably controlled at this point at age 68.          Meds ordered this encounter  Medications  . MITIGARE 0.6 MG CAPS    Sig: FOR FLARE TAKE 2 TABLETS ONCE THEN REPEAT 1 TABLET IN 2 HOURS IF PAINCONTINUES, THEN 1 TABLET DAILY    Dispense:  60 capsule    Refill:  0     [name], MD

## 2020-11-13 NOTE — Assessment & Plan Note (Signed)
Chronic, worsening control.  He is not taking medication as instructed.[name] adjusts dose on prn basis. Stopped glucotrol.  Encouraged his to be more consistent with keeping up with nutrition and stable dosing of insulin, but I understand quality of life for him is most important and Dm is tolerably controlled at this point at age 79.

## 2020-11-18 DIAGNOSIS — J849 Interstitial pulmonary disease, unspecified: Secondary | ICD-10-CM | POA: Diagnosis not present

## 2020-11-23 ENCOUNTER — Encounter: Payer: Self-pay | Admitting: Family Medicine

## 2020-11-25 DIAGNOSIS — R0602 Shortness of breath: Secondary | ICD-10-CM | POA: Diagnosis not present

## 2020-11-25 DIAGNOSIS — J849 Interstitial pulmonary disease, unspecified: Secondary | ICD-10-CM | POA: Diagnosis not present

## 2020-12-10 ENCOUNTER — Encounter: Payer: Self-pay | Admitting: Pulmonary Disease

## 2020-12-10 ENCOUNTER — Ambulatory Visit (INDEPENDENT_AMBULATORY_CARE_PROVIDER_SITE_OTHER): Payer: Medicare HMO | Admitting: Pulmonary Disease

## 2020-12-10 ENCOUNTER — Other Ambulatory Visit: Payer: Self-pay

## 2020-12-10 VITALS — BP 122/76 | HR 88 | Temp 97.1°F | Ht 66.0 in | Wt 151.2 lb

## 2020-12-10 DIAGNOSIS — I2729 Other secondary pulmonary hypertension: Secondary | ICD-10-CM | POA: Diagnosis not present

## 2020-12-10 DIAGNOSIS — J84112 Idiopathic pulmonary fibrosis: Secondary | ICD-10-CM

## 2020-12-10 DIAGNOSIS — R0609 Other forms of dyspnea: Secondary | ICD-10-CM

## 2020-12-10 DIAGNOSIS — R0600 Dyspnea, unspecified: Secondary | ICD-10-CM

## 2020-12-10 NOTE — Progress Notes (Signed)
Subjective:    Patient ID: Brian Bass, male    DOB: [birth year], 80 y.o.   MRN: [id number]  HPI Brian Bass is an 80 year old lifelong never smoker who follows here for the issue of pulmonary fibrosis (IPF).  Previous patient of Dr. Ashby Dawes, I first evaluated him on 06 November 2019. The patient has been on Ofev after failing Esbriet since April 2021.  He has been tolerating this better.  He has issues with intractable cough for which he takes gabapentin with good results.  He has moderate pulmonary hypertension associated with his IPF.  He uses supplemental oxygen at 2 L/min only with ambulation but is able to be off of oxygen with rest.  He does use oxygen nocturnally consistently.  He has not had any recent fevers, chills or sweats.  Cough has been well controlled.  No sputum production or hemoptysis.  Gastroesophageal reflux is controlled with omeprazole.  He also notes that cough is controlled with Breztri.  All in all he feels that he is doing as well as expected.   DATA:  Serology 02/27/18>>ACE, rheumatoid factor, ANA negative.  PFT tracings 3/26/19FVC is 63% predicted, FEV1 of 70% predicted, ratio 86% predicted. There is no significant change with bronchodilator therapy. Flow volume loop is consistent with restriction.DLCO is 54% predicted, DLCO is reduced at 56%.  CT high-resolution 02/27/18>>the bibasilar fibrotic changes have advanced significantly in the subpleural areas of traction bronchiectasis changes have worsened. Right paratracheal lymphadenopathy minimally changed.  Chest x-ray 02/08/18;diffuse groundglass and interstitial changes throughout both lungs, right diaphragm is elevated. Interstitial changes has progressed significantly since previous chest x-ray on 09/06/13. CT chest 10/21/13, bilateral emphysematous changes, bilateral subpleural reticular changes consistent with pulmonary fibrosis.   Chemistry 01/23/2019>>AST, ALT normal.  Overnight oximetry  10/08/2018>>performed on room air; time less than or equal to 88% was 0.7 minutes. Oxygen was not required.  ONOX 11/13/2019 no significant desaturations though cyclical desats to 31% were noted.  CT chest high resolution 02/05/2020 persistent severe fibrotic changes unchanged from 3/19 however significantly progressed from 2014.  2D echo 02/05/2020 LVEF 60 to 65%, moderate pulmonary hypertension.  Review of Systems A 10 point review of systems was performed and it is as noted above otherwise negative. Patient Active Problem List   Diagnosis Date Noted  . Joint pain 09/09/2020  . Atherosclerosis of native coronary artery of native heart with stable angina pectoris ([place]) 03/23/2019  . Elevated PSA 01/29/2019  . Diabetic polyneuropathy associated with type 2 diabetes mellitus ([place]) 01/29/2019  . Positive cardiac stress test 03/06/2018  . DOE (dyspnea on exertion) 02/08/2018  . Benign prostatic hyperplasia with nocturia 02/08/2018  . Counseling regarding end of life decision making 07/14/2015  . GERD (gastroesophageal reflux disease) 12/13/2013  . Diffuse Parynchymal Lung Disease 11/08/2013  . CKD (chronic kidney disease) stage 2, GFR 60-89 ml/min 09/06/2013  . Idiopathic gout of multiple sites 12/03/2009  . CAD (coronary artery disease) 11/02/2009  . Type 2 diabetes mellitus with diabetic neuropathy, with long-term current use of insulin ([place]) 08/26/2009  . Hyperlipidemia associated with type 2 diabetes mellitus ([place]) 08/26/2009  . Hypertension associated with diabetes ([place]) 08/26/2009  . OSTEOARTHRITIS, MULTIPLE JOINTS 08/26/2009  . ESOPHAGEAL STRICTURE 10/14/2008  . IRRITABLE BOWEL SYNDROME 09/02/2008  . PERSONAL HX COLONIC POLYPS 09/02/2008   No Known Allergies  Current Meds  Medication Sig  . ACCU-CHEK FASTCLIX LANCETS MISC 1 Device by Other route 3 (three) times daily. Check blood sugars 2 to 3 times daily.  Dx: 250.02  .  ACCU-CHEK SMARTVIEW test strip TEST BLOOD SUGAR 2 TO 3  TIMES DAILY  . BD INSULIN SYRINGE U/F 31G X 5/16" 1 ML MISC USE TO INJECT LANTUS INSULIN 2 TIMES DAILY  . benzonatate (TESSALON) 100 MG capsule TAKE 2 CAPSULES BY MOUTH 3 TIMES DAILY  . Blood Glucose Monitoring Suppl (ACCU-CHEK NANO SMARTVIEW) W/DEVICE KIT 1 kit by Other route 3 (three) times daily. Use to check blood sugars 2 to 3 times daily.  Dx: 250.02  . Budeson-Glycopyrrol-Formoterol (BREZTRI AEROSPHERE) 160-9-4.8 MCG/ACT AERO Inhale 2 puffs into the lungs in the morning and at bedtime.  . cholestyramine light (PREVALITE) 4 g packet Take 1 packet (4 g total) by mouth 2 (two) times daily as needed (Diarrhea).  . ezetimibe (ZETIA) 10 MG tablet TAKE 1 TABLET BY MOUTH DAILY  . fenofibrate 160 MG tablet Take 1 tablet (160 mg total) by mouth daily.  [name] gabapentin (NEURONTIN) 100 MG capsule Take 1 capsule (100 mg total) by mouth at bedtime. (Patient taking differently: Take 300 mg by mouth at bedtime.)  . hydrOXYzine (VISTARIL) 25 MG capsule Take 1 capsule (25 mg total) by mouth 3 (three) times daily as needed for anxiety or nausea.  [name] LANTUS 100 UNIT/ML injection INJECT 55 UNITS SQ TWICE A DAY  . meloxicam (MOBIC) 7.5 MG tablet Take 1 tablet (7.5 mg total) by mouth daily.  [name] MITIGARE 0.6 MG CAPS FOR FLARE TAKE 2 TABLETS ONCE THEN REPEAT 1 TABLET IN 2 HOURS IF PAINCONTINUES, THEN 1 TABLET DAILY  . Multiple Vitamin (MULTIVITAMIN) tablet Take 1 tablet by mouth daily.  . Nintedanib (OFEV) 150 MG CAPS Take 150 mg by mouth 2 (two) times daily.  [name] omeprazole (PRILOSEC) 20 MG capsule TAKE 1 CAPSULE EVERY DAY  . ondansetron (ZOFRAN) 4 MG tablet Take 1 tablet (4 mg total) by mouth daily as needed for nausea or vomiting.  . simvastatin (ZOCOR) 40 MG tablet Take 1 tablet (40 mg total) by mouth at bedtime.  . tamsulosin (FLOMAX) 0.4 MG CAPS capsule TAKE 1 CAPSULE EVERY DAY IF NOT IMPROVING INCREASE TO TAKE 2 CAPSULES EVERY DAY  . [DISCONTINUED] Budeson-Glycopyrrol-Formoterol (BREZTRI AEROSPHERE) 160-9-4.8  MCG/ACT AERO Inhale 2 puffs into the lungs daily.   Immunization History  Administered Date(s) Administered  . Fluad Quad(high Dose 65+) 08/08/2019, 09/30/2020  . Influenza Split 10/05/2011, 10/11/2012  . Influenza Whole 11/12/2010  . Influenza,inj,Quad PF,6+ Mos 09/06/2013, 09/24/2014, 10/02/2015, 09/13/2016, 09/29/2017, 08/24/2018  . PFIZER SARS-COV-2 Vaccination 01/22/2020, 02/12/2020  . Pneumococcal Conjugate-13 01/12/2018  . Pneumococcal Polysaccharide-23 12/06/2007  . Td 08/10/2010, 04/10/2020       Objective:   Physical Exam BP 122/76 (BP Location: Left Arm, Cuff Size: Normal)   Pulse 88   Temp (!) 97.1 F (36.2 C) (Temporal)   Ht _0  (1.676 m)   Wt 151 lb 3.2 oz (68.6 kg)   SpO2 99%   BMI 24.40 kg/m  GENERAL: Awake and alert, no respiratory distress.  No conversational dyspnea.  He presents in transport chair due to dyspnea. HEAD: Normocephalic, atraumatic.  EYES: Pupils equal, round, reactive to light.  No scleral icterus.  MOUTH: Nose/mouth/throat not examined due to masking requirements for COVID 19. NECK: Supple. No thyromegaly. Trachea midline. No JVD.  No adenopathy. PULMONARY: Good air entry bilaterally.  Velcro crackles at bases otherwise, no adventitious sounds. CARDIOVASCULAR: S1 and S2. Regular rate and rhythm.  Grade 2/6 systolic ejection murmur at the left sternal border, no gallop.  No rubs. ABDOMEN:.  Benign. MUSCULOSKELETAL: No joint deformity, no  clubbing, no edema.  NEUROLOGIC: No overt focal deficits.  Gait not tested.  Speech is fluent. SKIN: Intact,warm,dry.  On limited exam no rashes or lesions. PSYCH: Mood and behavior are appropriate.     Assessment & Plan:     ICD-10-CM   1. IPF (idiopathic pulmonary fibrosis) (HCC)  J84.112 6 minute walk    Pulmonary Function Test [facility] Only    CT Chest High Resolution    CANCELED: CT Chest High Resolution   Tolerating Ofev 150 mg twice daily Cough well controlled Reassess with CT, 6-minute walk and  PFTs in 3 months  2. Other secondary pulmonary hypertension (HCC)  I27.29    Continue nocturnal oxygen supplementation Oxygen supplementation during exertion Reassess with 2D echo, 3 months  3. DOE (dyspnea on exertion)  R06.00    Stable on Ofev 6-minute walk in follow-up   Orders Placed This Encounter  Procedures  . CT Chest High Resolution    Standing Status:   Future    Standing Expiration Date:   12/10/2021    Scheduling Instructions:     23mo   Order Specific Question:   Preferred imaging location?    Answer:   [facility]  . Pulmonary Function Test [facility] Only    Standing Status:   Future    Standing Expiration Date:   12/10/2021    Scheduling Instructions:     3 mo    Order Specific Question:   Full PFT: includes the following: basic spirometry, spirometry pre & post bronchodilator, diffusion capacity (DLCO), lung volumes    Answer:   Full PFT  . 6 minute walk    Standing Status:   Future    Standing Expiration Date:   12/10/2021   Follow-up in 3 months time he is to contact [employer] to that time should any new difficulties arise.   CRenold Don MD  PCCM   *This note was dictated using voice recognition software/Dragon.  Despite best efforts to proofread, errors can occur which can change the meaning.  Any change was purely unintentional.

## 2020-12-10 NOTE — Patient Instructions (Signed)
I think overall you are doing well.  We will see you back in 3 months time we will get a CT scan of the chest, breathing tests and an oxygen walking test at that time.

## 2020-12-15 ENCOUNTER — Telehealth: Payer: Self-pay | Admitting: Family Medicine

## 2020-12-15 DIAGNOSIS — Z794 Long term (current) use of insulin: Secondary | ICD-10-CM

## 2020-12-15 DIAGNOSIS — E1140 Type 2 diabetes mellitus with diabetic neuropathy, unspecified: Secondary | ICD-10-CM

## 2020-12-15 MED ORDER — "INSULIN SYRINGE-NEEDLE U-100 31G X 5/16"" 1 ML MISC": 3 refills | Status: DC

## 2020-12-15 NOTE — Telephone Encounter (Signed)
Refill sent as requested. 

## 2020-12-15 NOTE — Telephone Encounter (Signed)
[name] called to request a refill of Insulin Syringe to be sent to [facility].

## 2020-12-19 DIAGNOSIS — J849 Interstitial pulmonary disease, unspecified: Secondary | ICD-10-CM | POA: Diagnosis not present

## 2020-12-23 ENCOUNTER — Telehealth: Payer: Self-pay | Admitting: Cardiovascular Disease

## 2020-12-23 NOTE — Telephone Encounter (Signed)
  Patient Consent for Virtual Visit         Brian Bass has provided verbal consent on 12/23/2020 for a virtual visit (video or telephone).   CONSENT FOR VIRTUAL VISIT FOR:  Brian Bass  By participating in this virtual visit I agree to the following:  I hereby voluntarily request, consent and authorize [facility] and its employed or contracted physicians, Engineer, materials, nurse practitioners or other licensed health care professionals (the Practitioner), to provide me with telemedicine health care services (the "Services") as deemed necessary by the treating Practitioner. I acknowledge and consent to receive the Services by the Practitioner via telemedicine. I understand that the telemedicine visit will involve communicating with the Practitioner through live audiovisual communication technology and the disclosure of certain medical information by electronic transmission. I acknowledge that I have been given the opportunity to request an in-person assessment or other available alternative prior to the telemedicine visit and am voluntarily participating in the telemedicine visit.  I understand that I have the right to withhold or withdraw my consent to the use of telemedicine in the course of my care at any time, without affecting my right to future care or treatment, and that the Practitioner or I may terminate the telemedicine visit at any time. I understand that I have the right to inspect all information obtained and/or recorded in the course of the telemedicine visit and may receive copies of available information for a reasonable fee.  I understand that some of the potential risks of receiving the Services via telemedicine include:  [name] Delay or interruption in medical evaluation due to technological equipment failure or disruption; . Information transmitted may not be sufficient (e.g. poor resolution of images) to allow for appropriate medical decision making by the  Practitioner; and/or  . In rare instances, security protocols could fail, causing a breach of personal health information.  Furthermore, I acknowledge that it is my responsibility to provide information about my medical history, conditions and care that is complete and accurate to the best of my ability. I acknowledge that Practitioner's advice, recommendations, and/or decision may be based on factors not within their control, such as incomplete or inaccurate data provided by me or distortions of diagnostic images or specimens that may result from electronic transmissions. I understand that the practice of medicine is not an exact science and that Practitioner makes no warranties or guarantees regarding treatment outcomes. I acknowledge that a copy of this consent can be made available to me via my patient portal ([facility]), or I can request a printed copy by calling the office of [facility].    I understand that my insurance will be billed for this visit.   I have read or had this consent read to me. . I understand the contents of this consent, which adequately explains the benefits and risks of the Services being provided via telemedicine.  . I have been provided ample opportunity to ask questions regarding this consent and the Services and have had my questions answered to my satisfaction. . I give my informed consent for the services to be provided through the use of telemedicine in my medical care

## 2020-12-24 ENCOUNTER — Other Ambulatory Visit: Payer: Self-pay | Admitting: *Deleted

## 2020-12-24 DIAGNOSIS — E1140 Type 2 diabetes mellitus with diabetic neuropathy, unspecified: Secondary | ICD-10-CM

## 2020-12-24 MED ORDER — "INSULIN SYRINGE-NEEDLE U-100 31G X 5/16"" 0.3 ML MISC": 3 refills | Status: AC

## 2020-12-26 DIAGNOSIS — R0602 Shortness of breath: Secondary | ICD-10-CM | POA: Diagnosis not present

## 2020-12-26 DIAGNOSIS — J849 Interstitial pulmonary disease, unspecified: Secondary | ICD-10-CM | POA: Diagnosis not present

## 2020-12-29 ENCOUNTER — Ambulatory Visit: Payer: Medicare HMO | Admitting: Cardiovascular Disease

## 2021-01-05 ENCOUNTER — Telehealth: Payer: Self-pay | Admitting: Pulmonary Disease

## 2021-01-05 NOTE — Telephone Encounter (Signed)
Spoke to patient's spouse, Brian Bass(DPR).  Brian Bass stated that she would drop off ofev patient assistant forms. She plans to bring forms by our office next week. Nothing further needed at this.

## 2021-01-07 NOTE — Telephone Encounter (Signed)
Patient's spouse, [name] dropped off ofev patient assistance forms.  Forms have been placed in Dr. Domingo Dimes folder for signature.

## 2021-01-08 DIAGNOSIS — R7989 Other specified abnormal findings of blood chemistry: Secondary | ICD-10-CM | POA: Diagnosis not present

## 2021-01-08 DIAGNOSIS — J84112 Idiopathic pulmonary fibrosis: Secondary | ICD-10-CM | POA: Diagnosis not present

## 2021-01-08 DIAGNOSIS — E119 Type 2 diabetes mellitus without complications: Secondary | ICD-10-CM | POA: Diagnosis not present

## 2021-01-08 DIAGNOSIS — R339 Retention of urine, unspecified: Secondary | ICD-10-CM | POA: Diagnosis not present

## 2021-01-08 NOTE — Telephone Encounter (Signed)
Forms have been signed by Dr. Patsey Berthold and faxed to [employer]. Received successful fax confirmation.  Patient's spouse, [name](DPR) is aware and voiced her understanding.  Nothing further needed.

## 2021-01-11 ENCOUNTER — Telehealth: Payer: Self-pay | Admitting: Family Medicine

## 2021-01-11 ENCOUNTER — Other Ambulatory Visit: Payer: Self-pay | Admitting: Family Medicine

## 2021-01-11 DIAGNOSIS — N401 Enlarged prostate with lower urinary tract symptoms: Secondary | ICD-10-CM

## 2021-01-11 NOTE — Telephone Encounter (Signed)
Daughter called and stated that the referral coordinator can call Curt Bears  [phone]

## 2021-01-11 NOTE — Telephone Encounter (Signed)
Received letter of approval for patient assistance from [facility] from 01/11/2021-12/04/2021. Left detailed message for patient

## 2021-01-11 NOTE — Telephone Encounter (Signed)
[facility] and had to leave a message for referral coordinator in regards to this referral, I also sent referral over to them through [facility].

## 2021-01-11 NOTE — Telephone Encounter (Signed)
Pt wife called in due to he was in the hospital due to he use the bathroom and he has a large prostate and he has a catheter and needs to see a urologist

## 2021-01-11 NOTE — Telephone Encounter (Signed)
Referral palced and MyChart message sent to notify wife/patient... if she does not view by midday.. please call and let her know this is in process. I put no location preference, first available.. hope that is acceptable to them.

## 2021-01-12 NOTE — Telephone Encounter (Signed)
Spoke with Brian Bass at [facility] on 01/11/21 and patient has been scheduled per patient's and office's availability for 01/18/21

## 2021-01-12 NOTE — Telephone Encounter (Signed)
Patient's daughter called back and states that patient wants to go to [facility] doctor/office instead and if he can not get in with them he would like to go to [facility] facility. I called [facility] and waiting to hear back from the nurse to see when patient could be worked in. Curt Bears, patient's daughter, advised

## 2021-01-13 NOTE — Telephone Encounter (Signed)
Spoke with Curt Bears, patient is scheduled with [facility] on 01/19/21. See notes in the chart about this also. Daughter is Patent attorney. Cancelled appointment with [facility].

## 2021-01-18 ENCOUNTER — Ambulatory Visit: Payer: Self-pay | Admitting: Urology

## 2021-01-19 DIAGNOSIS — N401 Enlarged prostate with lower urinary tract symptoms: Secondary | ICD-10-CM | POA: Diagnosis not present

## 2021-01-19 DIAGNOSIS — R338 Other retention of urine: Secondary | ICD-10-CM | POA: Diagnosis not present

## 2021-01-19 DIAGNOSIS — R339 Retention of urine, unspecified: Secondary | ICD-10-CM | POA: Insufficient documentation

## 2021-01-19 DIAGNOSIS — Z466 Encounter for fitting and adjustment of urinary device: Secondary | ICD-10-CM | POA: Diagnosis not present

## 2021-01-19 DIAGNOSIS — J849 Interstitial pulmonary disease, unspecified: Secondary | ICD-10-CM | POA: Diagnosis not present

## 2021-01-20 DIAGNOSIS — R82998 Other abnormal findings in urine: Secondary | ICD-10-CM | POA: Diagnosis not present

## 2021-01-20 DIAGNOSIS — R339 Retention of urine, unspecified: Secondary | ICD-10-CM | POA: Diagnosis not present

## 2021-01-21 ENCOUNTER — Telehealth: Payer: Self-pay

## 2021-01-21 NOTE — Telephone Encounter (Signed)
Noted.

## 2021-01-21 NOTE — Telephone Encounter (Signed)
Called and LVM for patient to return call to office.

## 2021-01-21 NOTE — Telephone Encounter (Signed)
[facility] - Client TELEPHONE ADVICE RECORD [facility] Patient Name: Brian Bass Gender: Male DOB: [birth year] Age: 80 Y 76 M 28 D Return Phone Number: [phone] (Primary), [phone] (Secondary) Address: City/State/Zip: [place] [facility] - Client Client Site [place] - Day Contact Type Call Who Is Calling Patient / Member / Family / Caregiver Call Type Triage / Clinical Caller Name Bennett Scrape Relationship To Patient Daughter Return Phone Number [phone] (Primary) Chief Complaint BLOOD PRESSURE LOW - Systolic (top number) 90 or less with dizzy or weak symptoms Reason for Call Symptomatic / Request for Health Information Initial Comment Caller reports that her father is feeling weak this morning and his blood pressure was 90/60 last PM. He was also having issues with low blood pressure yesterday. Translation No Nurse Assessment Nurse: Sumner Boast, RN, Enid Derry Date/Time Eilene Ghazi Time): 01/21/2021 9:18:34 AM Confirm and document reason for call. If symptomatic, describe symptoms. ---Caller states her father is feeling weak this morning and his B/P was 90/60 last night. States she is not with her father right now, not even in the same city. She has only spoken to him on the phone. Unable to do triage. Does the patient have any new or worsening symptoms? ---No Disp. Time Eilene Ghazi Time) Disposition Final User 01/21/2021 9:16:30 AM Send to Urgent Marlyce Huge 01/21/2021 9:22:29 AM Clinical Call Yes Sumner Boast, RN, Enid Derry

## 2021-01-21 NOTE — Telephone Encounter (Signed)
Patients wife called back and stated that about a week and a half ago, the patient experienced issues with not being able to urinate so he went to the ED and had a catheter placed. He followed up with his urologist this week and they removed the catheter, but stated that he developed a UTI from this. Urine cultures were sent, but they are not back as of yet. Also, patients wife stated that the urologist did not prescribe any antibiotics as of yet. This morning the patients wife took his blood pressure and it was 90/60. Patient denies dizziness, lightheadedness, fever and is urinating normally. Patients wife was concerned because she read that Flomax can cause low blood pressure and he has been taking it as prescribed.  The latest B/P reading was 111/63. Encouraged patients wife to also call urologists to make them aware. Also, informed patients wife that if patient develops new or worsening symptoms, such as: B/P below 90/60, dizziness, lightheadedness, fever, SOB, chest pain, or inability to urinate then patient should be seen at [facility] or ED D/T increased risk of sepsis. Patients wife verbalized understanding. Please advise.

## 2021-01-25 ENCOUNTER — Telehealth: Payer: Self-pay | Admitting: Family Medicine

## 2021-01-25 DIAGNOSIS — Z794 Long term (current) use of insulin: Secondary | ICD-10-CM

## 2021-01-25 DIAGNOSIS — M1009 Idiopathic gout, multiple sites: Secondary | ICD-10-CM

## 2021-01-25 DIAGNOSIS — E1140 Type 2 diabetes mellitus with diabetic neuropathy, unspecified: Secondary | ICD-10-CM

## 2021-01-25 NOTE — Telephone Encounter (Signed)
-----  Message from Cloyd Stagers, RT sent at 01/18/2021  2:07 PM EST ----- Regarding: Lab Orders for Wednesday 3.2.2022 Please place lab orders for Wednesday 3.2.2022, office visit for physical on Friday 3.11.2022 Thank you, Dyke Maes RT(R)

## 2021-01-26 DIAGNOSIS — R55 Syncope and collapse: Secondary | ICD-10-CM | POA: Diagnosis not present

## 2021-01-26 DIAGNOSIS — J84112 Idiopathic pulmonary fibrosis: Secondary | ICD-10-CM | POA: Diagnosis not present

## 2021-01-26 DIAGNOSIS — I959 Hypotension, unspecified: Secondary | ICD-10-CM | POA: Diagnosis not present

## 2021-01-26 DIAGNOSIS — N400 Enlarged prostate without lower urinary tract symptoms: Secondary | ICD-10-CM | POA: Diagnosis not present

## 2021-01-26 DIAGNOSIS — M6289 Other specified disorders of muscle: Secondary | ICD-10-CM | POA: Diagnosis not present

## 2021-01-26 DIAGNOSIS — R918 Other nonspecific abnormal finding of lung field: Secondary | ICD-10-CM | POA: Diagnosis not present

## 2021-01-26 DIAGNOSIS — N390 Urinary tract infection, site not specified: Secondary | ICD-10-CM | POA: Diagnosis not present

## 2021-01-26 DIAGNOSIS — R0602 Shortness of breath: Secondary | ICD-10-CM | POA: Diagnosis not present

## 2021-01-26 DIAGNOSIS — R338 Other retention of urine: Secondary | ICD-10-CM | POA: Diagnosis not present

## 2021-01-26 DIAGNOSIS — J849 Interstitial pulmonary disease, unspecified: Secondary | ICD-10-CM | POA: Diagnosis not present

## 2021-01-26 DIAGNOSIS — I493 Ventricular premature depolarization: Secondary | ICD-10-CM | POA: Diagnosis not present

## 2021-01-26 DIAGNOSIS — T83511A Infection and inflammatory reaction due to indwelling urethral catheter, initial encounter: Secondary | ICD-10-CM | POA: Diagnosis not present

## 2021-01-26 DIAGNOSIS — R42 Dizziness and giddiness: Secondary | ICD-10-CM | POA: Diagnosis not present

## 2021-01-26 DIAGNOSIS — N3289 Other specified disorders of bladder: Secondary | ICD-10-CM | POA: Diagnosis not present

## 2021-01-26 DIAGNOSIS — K219 Gastro-esophageal reflux disease without esophagitis: Secondary | ICD-10-CM | POA: Diagnosis not present

## 2021-01-26 DIAGNOSIS — N401 Enlarged prostate with lower urinary tract symptoms: Secondary | ICD-10-CM | POA: Diagnosis not present

## 2021-01-26 DIAGNOSIS — E119 Type 2 diabetes mellitus without complications: Secondary | ICD-10-CM | POA: Diagnosis not present

## 2021-01-26 DIAGNOSIS — Z79899 Other long term (current) drug therapy: Secondary | ICD-10-CM | POA: Diagnosis not present

## 2021-01-26 DIAGNOSIS — Z20822 Contact with and (suspected) exposure to covid-19: Secondary | ICD-10-CM | POA: Diagnosis not present

## 2021-01-26 DIAGNOSIS — R4182 Altered mental status, unspecified: Secondary | ICD-10-CM | POA: Diagnosis not present

## 2021-01-26 DIAGNOSIS — E1165 Type 2 diabetes mellitus with hyperglycemia: Secondary | ICD-10-CM | POA: Diagnosis not present

## 2021-01-26 NOTE — Progress Notes (Deleted)
Virtual Visit via Telephone Note   This visit type was conducted due to national recommendations for restrictions regarding the COVID-19 Pandemic (e.g. social distancing) in an effort to limit this patient's exposure and mitigate transmission in our community.  Due to his co-morbid illnesses, this patient is at least at moderate risk for complications without adequate follow up.  This format is felt to be most appropriate for this patient at this time.  The patient did not have access to video technology/had technical difficulties with video requiring transitioning to audio format only (telephone).  All issues noted in this document were discussed and addressed.  No physical exam could be performed with this format.  Please refer to the patient's chart for his  consent to telehealth for [facility]   I connected with  Brian Bass on 01/26/21 by a video enabled telemedicine application and verified that I am speaking with the correct person using two identifiers. I discussed the limitations of evaluation and management by telemedicine. The patient expressed understanding and agreed to proceed.   Evaluation Performed:  Follow-up visit  Date:  01/26/2021   ID:  Brian Bass, DOB 12-Apr-1941, MRN [id number]  Patient Location:  [address] [place] [address]   Provider location:   [facility] [place] office  PCP:  Jinny Sanders, MD  Cardiologist:  Patsy Baltimore   Chief Complaint:  SOB    History of Present Illness:    Brian Bass is a 80 y.o. male who presents via audio/video conferencing for a telehealth visit today.   The patient does not symptoms concerning for COVID-19 infection (fever, chills, cough, or new SHORTNESS OF BREATH).   Patient has a past medical history of HTN,  Diffuse parenchymal lung disease,  dating back to 2014 CAD,  CABG x 3, November, 2011. [facility] poorly controlled DM  presents for f/u of his worsening  dyspnea on exertion  Chronic SOB, Little cough Tires out easy No regular exercise On flat surface does fine, only symptomatic if he walks quickly up hills  He does deny chest pain, leg edema  Walks 1 mile a day with the dog  Sees Dr. Juanell Fairly, pulmonary  Labs reviewed total chol 95 on simvastatin and zetia HBA1C 7.9  Other past medical hx reviewed  Stress test 02/28/2018 Pharmacological myocardial perfusion imaging study with moderate sized region of moderate intensity ischemia in the mid to distal LAD Ischemia noted on attenuation corrected and non-attenuation corrected images Normal wall motion, EF estimated at 40% No EKG changes concerning for ischemia at peak stress or in recovery. High risk scan    CT scan 2014 and last week showing interstitial lung disease getting worse  Fell accidental in 11/2017 and broke ribs on Left side.. No CXR. Pain resolved after 6 weeks.   Prior CV studies:   The following studies were reviewed today:    Past Medical History:  Diagnosis Date  . Adenomatous colon polyp   . Allergy   . Arthritis   . Cataract   . Complication of anesthesia    Per pt, hard to wake up past last colon in 2011!  . Diabetes mellitus   . Esophageal stricture   . GERD (gastroesophageal reflux disease)   . History of colon polyps 922/2011  . Hyperlipidemia   . IBS (irritable bowel syndrome)   . Pulmonary fibrosis ([place])    Past Surgical History:  Procedure Laterality Date  . COLONOSCOPY    .  CORONARY ARTERY BYPASS GRAFT  10-2010   3 vessels  . UPPER GASTROINTESTINAL ENDOSCOPY       No outpatient medications have been marked as taking for the 01/27/21 encounter (Appointment) with Minna Merritts, MD.     Allergies:   Patient has no known allergies.   Social History   Tobacco Use  . Smoking status: Never Smoker  . Smokeless tobacco: Never Used  Vaping Use  . Vaping Use: Never used  Substance Use Topics  . Alcohol use: No  . Drug use: No      Current Outpatient Medications on File Prior to Visit  Medication Sig Dispense Refill  . ACCU-CHEK FASTCLIX LANCETS MISC 1 Device by Other route 3 (three) times daily. Check blood sugars 2 to 3 times daily.  Dx: 250.02 102 each 5  . ACCU-CHEK SMARTVIEW test strip TEST BLOOD SUGAR 2 TO 3 TIMES DAILY 300 strip 3  . benzonatate (TESSALON) 100 MG capsule TAKE 2 CAPSULES BY MOUTH 3 TIMES DAILY 180 capsule 3  . Blood Glucose Monitoring Suppl (ACCU-CHEK NANO SMARTVIEW) W/DEVICE KIT 1 kit by Other route 3 (three) times daily. Use to check blood sugars 2 to 3 times daily.  Dx: 250.02 1 kit 0  . Budeson-Glycopyrrol-Formoterol (BREZTRI AEROSPHERE) 160-9-4.8 MCG/ACT AERO Inhale 2 puffs into the lungs in the morning and at bedtime. 10.7 g 11  . cholestyramine light (PREVALITE) 4 g packet Take 1 packet (4 g total) by mouth 2 (two) times daily as needed (Diarrhea). 60 each 3  . ezetimibe (ZETIA) 10 MG tablet TAKE 1 TABLET BY MOUTH DAILY 90 tablet 3  . fenofibrate 160 MG tablet Take 1 tablet (160 mg total) by mouth daily. 90 tablet 1  . gabapentin (NEURONTIN) 100 MG capsule Take 1 capsule (100 mg total) by mouth at bedtime. (Patient taking differently: Take 300 mg by mouth at bedtime.) 30 capsule 2  . hydrOXYzine (VISTARIL) 25 MG capsule Take 1 capsule (25 mg total) by mouth 3 (three) times daily as needed for anxiety or nausea. 30 capsule 0  . Insulin Syringe-Needle U-100 (DROPLET INSULIN SYRINGE) 31G X 5/16" 0.3 ML MISC USE TO INJECT LANTUS INSULIN 2 TIMES DAILY 200 each 3  . LANTUS 100 UNIT/ML injection INJECT 55 UNITS SQ TWICE A DAY 100 mL 0  . meloxicam (MOBIC) 7.5 MG tablet Take 1 tablet (7.5 mg total) by mouth daily. 30 tablet 0  . MITIGARE 0.6 MG CAPS FOR FLARE TAKE 2 TABLETS ONCE THEN REPEAT 1 TABLET IN 2 HOURS IF PAINCONTINUES, THEN 1 TABLET DAILY 60 capsule 0  . Multiple Vitamin (MULTIVITAMIN) tablet Take 1 tablet by mouth daily.    . Nintedanib (OFEV) 150 MG CAPS Take 150 mg by mouth 2 (two)  times daily.    [name] omeprazole (PRILOSEC) 20 MG capsule TAKE 1 CAPSULE EVERY DAY 90 capsule 3  . ondansetron (ZOFRAN) 4 MG tablet Take 1 tablet (4 mg total) by mouth daily as needed for nausea or vomiting. 30 tablet 1  . simvastatin (ZOCOR) 40 MG tablet Take 1 tablet (40 mg total) by mouth at bedtime. 90 tablet 2  . tamsulosin (FLOMAX) 0.4 MG CAPS capsule TAKE 1 CAPSULE EVERY DAY IF NOT IMPROVING INCREASE TO TAKE 2 CAPSULES EVERY DAY 60 capsule 5   No current facility-administered medications on file prior to visit.     Family Hx: The patient's family history includes Alcohol abuse in his brother; Aneurysm in his father; Colon cancer in his cousin; Heart failure in his  mother. There is no history of Esophageal cancer, Rectal cancer, or Stomach cancer.  ROS:   Please see the history of present illness.    Review of Systems  Constitutional: Negative.   Respiratory: Positive for shortness of breath.   Cardiovascular: Negative.   Gastrointestinal: Negative.   Musculoskeletal: Negative.   Neurological: Negative.   Psychiatric/Behavioral: Negative.   All other systems reviewed and are negative.    Labs/Other Tests and Data Reviewed:    Recent Labs: 06/18/2020: Hemoglobin 13.8; Platelets 322 11/09/2020: ALT 24; BUN 11; Creatinine, Ser 1.05; Potassium 4.4; Sodium 130   Recent Lipid Panel Lab Results  Component Value Date/Time   CHOL 124 08/03/2020 07:59 AM   TRIG 97.0 08/03/2020 07:59 AM   HDL 32.80 (L) 08/03/2020 07:59 AM   CHOLHDL 4 08/03/2020 07:59 AM   LDLCALC 72 08/03/2020 07:59 AM   LDLDIRECT 91.0 05/26/2020 11:40 AM    Wt Readings from Last 3 Encounters:  12/10/20 151 lb 3.2 oz (68.6 kg)  11/13/20 151 lb (68.5 kg)  08/06/20 156 lb 8 oz (71 kg)     Exam:    Vital Signs: Vital signs may also be detailed in the HPI There were no vitals taken for this visit.  Wt Readings from Last 3 Encounters:  12/10/20 151 lb 3.2 oz (68.6 kg)  11/13/20 151 lb (68.5 kg)  08/06/20 156 lb  8 oz (71 kg)   Temp Readings from Last 3 Encounters:  12/10/20 (!) 97.1 F (36.2 C) (Temporal)  11/13/20 97.6 F (36.4 C) (Oral)  08/06/20 (!) 97.1 F (36.2 C) (Temporal)   BP Readings from Last 3 Encounters:  12/10/20 122/76  11/13/20 122/80  08/06/20 126/70   Pulse Readings from Last 3 Encounters:  12/10/20 88  11/13/20 92  08/06/20 81    Vitals today 125/66 Pulse  79 Oxygen 98%  Well nourished, well developed male in no acute distress. Constitutional:  oriented to person, place, and time. No distress.     ASSESSMENT & PLAN:    Atherosclerosis of native coronary artery with stable angina pectoris, unspecified whether native or transplanted heart ([place]) - Currently with no symptoms of angina. No further workup at this time. Continue current medication regimen. Stable  Diabetes mellitus type 2, uncontrolled, without complications (HCC) -   dietary indiscretion On insulin Wife monitoring foods  Essential hypertension, benign Blood pressure is well controlled on today's visit. No changes made to the medications. Blood pressure stable  HYPERCHOLESTEROLEMIA Cholesterol is at goal on the current lipid regimen. No changes to the medications were made. Better with zetia  Hx of CABG - Plan: EKG 12-Lead Stress test as above with suggestion of ischemia anterior wall though clearly not definitive in the setting of some artifact As above CTA offered, cardiac catheterization offered, he will call us back if he would like either of these done  Diffuse Parynchymal Lung Disease Followed by pulmonary, Dr. Juanell Fairly  Shortness of breath -  interstitial lung disease Scheduled to see pulmonary   COVID-19 Education: The signs and symptoms of COVID-19 were discussed with the patient and how to seek care for testing (follow up with PCP or arrange E-visit).  The importance of social distancing was discussed today.  Patient Risk:   After full review of this patients clinical  status, I feel that they are at least moderate risk at this time.  Time:   Today, I have spent 25 minutes with the patient with telehealth technology discussing the cardiac and medical problems/diagnoses  detailed above   10 min spent reviewing the chart prior to patient visit today  Medication Adjustments/Labs and Tests Ordered: Current medicines are reviewed at length with the patient today.  Concerns regarding medicines are outlined above.   Tests Ordered: No tests ordered   Medication Changes: No changes made   Disposition: Follow-up in 6 months   Signed, Ida Rogue, MD  01/26/2021 6:22 PM    [place] Office [address] [place] [address], [place], [place] [address]

## 2021-01-27 ENCOUNTER — Telehealth: Payer: Self-pay | Admitting: Pulmonary Disease

## 2021-01-27 ENCOUNTER — Other Ambulatory Visit: Payer: Self-pay

## 2021-01-27 ENCOUNTER — Telehealth: Payer: Medicare HMO | Admitting: Cardiovascular Disease

## 2021-01-27 DIAGNOSIS — R0600 Dyspnea, unspecified: Secondary | ICD-10-CM

## 2021-01-27 DIAGNOSIS — Z951 Presence of aortocoronary bypass graft: Secondary | ICD-10-CM

## 2021-01-27 DIAGNOSIS — J849 Interstitial pulmonary disease, unspecified: Secondary | ICD-10-CM

## 2021-01-27 DIAGNOSIS — I10 Essential (primary) hypertension: Secondary | ICD-10-CM

## 2021-01-27 DIAGNOSIS — J84112 Idiopathic pulmonary fibrosis: Secondary | ICD-10-CM

## 2021-01-27 DIAGNOSIS — I25118 Atherosclerotic heart disease of native coronary artery with other forms of angina pectoris: Secondary | ICD-10-CM

## 2021-01-27 DIAGNOSIS — Z5181 Encounter for therapeutic drug level monitoring: Secondary | ICD-10-CM

## 2021-01-27 DIAGNOSIS — E7800 Pure hypercholesterolemia, unspecified: Secondary | ICD-10-CM

## 2021-01-27 DIAGNOSIS — R0602 Shortness of breath: Secondary | ICD-10-CM

## 2021-01-27 NOTE — Telephone Encounter (Signed)
Spoke to patient's spouse, [name](DPR). [name] stated that patient had an ED visit yesterday at [facility]. During the visit, labs were checked and patient's liver function is elevated. She is questioning if patient should change his dosage of ofev. He is currently talking 154m BID.  Dr. GPatsey Berthold please advise. Thanks

## 2021-01-27 NOTE — Telephone Encounter (Signed)
It looks like he was exceedingly sick when he was seen.  This can also affect the liver particularly given that his lactic acid was up.  However out of precaution decrease Ofev to just only once a day.  We will check liver function panel in 2 weeks.  Diagnosis: Medication monitoring.

## 2021-01-28 NOTE — Telephone Encounter (Signed)
Lm for patient's spouse, [name].

## 2021-01-28 NOTE — Telephone Encounter (Signed)
Patient's spouse, [name](DPR) is aware of below recommendations. She voiced her understanding.  LFT has been ordered.  Nothing further needed.

## 2021-02-02 ENCOUNTER — Telehealth: Payer: Self-pay | Admitting: Family Medicine

## 2021-02-02 NOTE — Telephone Encounter (Signed)
Patient's wife,[name],called. Patient has an appointment for labs tomorrow and the wellness visit is next week.  [name] wants to talk to [name] before he comes for the appointments. Please call [name] back.

## 2021-02-02 NOTE — Telephone Encounter (Signed)
Left voice message to call the office

## 2021-02-02 NOTE — Telephone Encounter (Signed)
Agree completely with postponement.

## 2021-02-02 NOTE — Telephone Encounter (Signed)
Spoke with [name]. She states Yoshito is scheduled for labs here tomorrow and his CPE next week.  He has a lot going on right now.  He was been seen at [facility] in [place] on 01/08/2021 for urinary retention and again on 01/26/2021.  He currently has a in dwelling catherter in place and is scheduled to follow up with the urology.  His labs from the hospital showed that his  liver functions was elevated but they have contacted Dr. Patsey Berthold who cut his OFEV in half and has him scheduled to recheck his labs again next Thursday.  She is asking if it would be okay to postpone Jheremy's CPE for at least a month to get him through all this.  I advised I would have our scheduler call her to reschedule these appointments for sometime in April.  I will also forward to Dr. Diona Browner to review his records in [place].

## 2021-02-03 ENCOUNTER — Other Ambulatory Visit: Payer: Medicare HMO

## 2021-02-08 ENCOUNTER — Other Ambulatory Visit: Payer: Self-pay | Admitting: Family Medicine

## 2021-02-08 ENCOUNTER — Other Ambulatory Visit: Payer: Self-pay | Admitting: Pulmonary Disease

## 2021-02-08 DIAGNOSIS — N390 Urinary tract infection, site not specified: Secondary | ICD-10-CM | POA: Diagnosis not present

## 2021-02-08 DIAGNOSIS — R339 Retention of urine, unspecified: Secondary | ICD-10-CM | POA: Diagnosis not present

## 2021-02-08 DIAGNOSIS — N1339 Other hydronephrosis: Secondary | ICD-10-CM | POA: Diagnosis not present

## 2021-02-08 NOTE — Telephone Encounter (Signed)
Last office visit 11/13/2020 for DM.  Last refilled 08/06/2020 for #30 with 1 refill.  CPE scheduled for 03/26/2021.

## 2021-02-11 ENCOUNTER — Telehealth: Payer: Self-pay

## 2021-02-11 ENCOUNTER — Other Ambulatory Visit
Admission: RE | Admit: 2021-02-11 | Discharge: 2021-02-11 | Disposition: A | Discharge status: Home / Self Care | Payer: Medicare HMO | Source: Ambulatory Visit | Attending: Pulmonary Disease | Admitting: Pulmonary Disease

## 2021-02-11 DIAGNOSIS — Z5181 Encounter for therapeutic drug level monitoring: Secondary | ICD-10-CM | POA: Insufficient documentation

## 2021-02-11 LAB — HEPATIC FUNCTION PANEL
ALT: 24 U/L (ref 0–44)
AST: 22 U/L (ref 15–41)
Albumin: 3.9 g/dL (ref 3.5–5.0)
Alkaline Phosphatase: 121 U/L (ref 38–126)
Bilirubin, Direct: 0.2 mg/dL (ref 0.0–0.2)
Indirect Bilirubin: 0.8 mg/dL (ref 0.3–0.9)
Total Bilirubin: 1 mg/dL (ref 0.3–1.2)
Total Protein: 7.8 g/dL (ref 6.5–8.1)

## 2021-02-11 NOTE — Addendum Note (Signed)
Addended by: Santiago Bur on: 02/11/2021 11:18 AM   Modules accepted: Orders

## 2021-02-11 NOTE — Chronic Care Management (AMB) (Signed)
    Chronic Care Management Pharmacy Assistant   Name: Brian Bass  MRN: [id number] DOB: 1941/08/21  Reason for Encounter: Patient Assistance Application - Approval- [name]    Medications: Outpatient Encounter Medications as of 02/11/2021  Medication Sig  . ACCU-CHEK FASTCLIX LANCETS MISC 1 Device by Other route 3 (three) times daily. Check blood sugars 2 to 3 times daily.  Dx: 250.02  . ACCU-CHEK SMARTVIEW test strip TEST BLOOD SUGAR 2 TO 3 TIMES DAILY  . benzonatate (TESSALON) 100 MG capsule TAKE 2 CAPSULES BY MOUTH 3 TIMES DAILY  . Blood Glucose Monitoring Suppl (ACCU-CHEK NANO SMARTVIEW) W/DEVICE KIT 1 kit by Other route 3 (three) times daily. Use to check blood sugars 2 to 3 times daily.  Dx: 250.02  . Budeson-Glycopyrrol-Formoterol (BREZTRI AEROSPHERE) 160-9-4.8 MCG/ACT AERO Inhale 2 puffs into the lungs in the morning and at bedtime.  . cholestyramine light (PREVALITE) 4 g packet Take 1 packet (4 g total) by mouth 2 (two) times daily as needed (Diarrhea).  . ezetimibe (ZETIA) 10 MG tablet TAKE 1 TABLET BY MOUTH DAILY  . fenofibrate 160 MG tablet Take 1 tablet (160 mg total) by mouth daily.  [name] gabapentin (NEURONTIN) 100 MG capsule Take 1 capsule (100 mg total) by mouth at bedtime. (Patient taking differently: Take 300 mg by mouth at bedtime.)  . hydrOXYzine (VISTARIL) 25 MG capsule Take 1 capsule (25 mg total) by mouth 3 (three) times daily as needed for anxiety or nausea.  . Insulin Syringe-Needle U-100 (DROPLET INSULIN SYRINGE) 31G X 5/16" 0.3 ML MISC USE TO INJECT LANTUS INSULIN 2 TIMES DAILY  . LANTUS 100 UNIT/ML injection INJECT 55 UNITS SQ TWICE A DAY  . meloxicam (MOBIC) 7.5 MG tablet Take 1 tablet (7.5 mg total) by mouth daily.  [name] MITIGARE 0.6 MG CAPS FOR FLARE TAKE 2 TABLETS ONCE THEN REPEAT 1 TABLET IN 2 HOURS IF PAINCONTINUES, THEN 1 TABLET DAILY  . Multiple Vitamin (MULTIVITAMIN) tablet Take 1 tablet by mouth daily.  . Nintedanib (OFEV) 150 MG CAPS Take 150 mg by  mouth 2 (two) times daily.  [name] omeprazole (PRILOSEC) 20 MG capsule TAKE 1 CAPSULE EVERY DAY  . ondansetron (ZOFRAN) 4 MG tablet TAKE ONE TABLET BY MOUTH EVERY DAY AS NEEDED FOR NAUSEA  . simvastatin (ZOCOR) 40 MG tablet Take 1 tablet (40 mg total) by mouth at bedtime.  . tamsulosin (FLOMAX) 0.4 MG CAPS capsule TAKE 1 CAPSULE EVERY DAY IF NOT IMPROVING INCREASE TO TAKE 2 CAPSULES EVERY DAY   No facility-administered encounter medications on file as of 02/11/2021.    Contacted [employer]  to follow up on patient assistance application for [employer]. Per representative at [place] states patient has been approved starting  12/05/20 - 12/04/21.   Follow-Up:  Patient Assistance Coordination and Pharmacist Review  Debbora Dus, CPP notified  Margaretmary Dys, [place] [phone]  Total time spent for month: 8

## 2021-02-12 ENCOUNTER — Telehealth: Payer: Self-pay | Admitting: Pulmonary Disease

## 2021-02-12 ENCOUNTER — Encounter: Payer: Medicare HMO | Admitting: Family Medicine

## 2021-02-12 NOTE — Telephone Encounter (Signed)
When I called the patient today about scheduling his Chest CT the wife was asking about the results of his lab work and if he needed to adjust his medicine.  The wife also thinks that he had some kind of CT scan at [facility] when his daughter carried him there. Can we see any scans in Care Everywhere

## 2021-02-12 NOTE — Telephone Encounter (Signed)
I suspect the scanned that they are referring to is one performed on 26 January 2021 this was a BRAIN scan so it will not show me anything about the lungs.  I had already made a comment on the results of the blood work, this is on a result note and it was directed to [facility] so she should be able to call him on 3/14.

## 2021-02-15 NOTE — Telephone Encounter (Signed)
Patient's spouse, [name](dpr) is aware of labs results and below message.  She voiced her understanding and had no further questions.   Rodena Piety, please advise. Thanks

## 2021-02-16 DIAGNOSIS — J849 Interstitial pulmonary disease, unspecified: Secondary | ICD-10-CM | POA: Diagnosis not present

## 2021-02-17 ENCOUNTER — Telehealth: Payer: Self-pay

## 2021-02-17 NOTE — Telephone Encounter (Signed)
I spoke with Mrs. Donaghue and she states that the patient is pending bladder surgery sometime soon to have tube put in his bladder. She will call back when this has been done

## 2021-02-17 NOTE — Telephone Encounter (Signed)
I have left a message for the wife [name] to call back to schedule his CT

## 2021-02-17 NOTE — Telephone Encounter (Signed)
[place] Night - Client Nonclinical Telephone Record AccessNurse Client [name] - Client Client Site [name] Physician [name] - MD Contact Type Call Who Is Calling Patient / Member / Family / Caregiver Caller Name [name] Phone Number [phone] Call Type Message Only Information Provided Reason for Call Returning a Call from the Office Initial [place] states she is returning a call from the office for her husband. Additional Comment She believes the call was for her husband Brian Bass Disp. Time Disposition Final User 02/16/2021 5:14:01 PM General Information Provided Yes Bass, Brian Dy Call Closed By: Hilarie Fredrickson Transaction Date/Time: 02/16/2021 5:11:24 PM (ET)

## 2021-02-17 NOTE — Telephone Encounter (Signed)
See 02/02/21 phone note. Sending to [place] at front desk.

## 2021-02-24 ENCOUNTER — Telehealth: Payer: Self-pay | Admitting: Pulmonary Disease

## 2021-02-24 NOTE — Telephone Encounter (Signed)
Per appt notes pt has already been scheduled for labs and CPX in April 2022.

## 2021-02-25 NOTE — Telephone Encounter (Signed)
Mrs. Wedge called me back and I have all the appts information Covid Test scheduled 03/15/21 [facility] at 10:00am Chest CT sche 03/16/21 at 1:30pm at [facility] arrival time 1:15pm.  PFT Test "Breathing Test has been scheduled 03/16/21 @ 2:00pm at [facility] She is aware of the appts and location

## 2021-02-25 NOTE — Telephone Encounter (Signed)
Pt returning call from [place] . Please advise

## 2021-02-25 NOTE — Telephone Encounter (Signed)
Brian Bass called me back and I have all the appts information Covid Test scheduled 03/15/21 [facility] at 10:00am Chest CT sche 03/16/21 at 1:30pm at [facility] arrival time 1:15pm.  PFT Test "Breathing Test has been scheduled 03/16/21 @ 2:00pm at [facility] She is aware of the appts and location  Brian Bass is wanting to get the follow up appt scheduled with Dr. Patsey Berthold. She would like afternoons. I will forward to Melissa to get this scheduled

## 2021-03-08 ENCOUNTER — Telehealth: Payer: Self-pay

## 2021-03-08 DIAGNOSIS — Z79899 Other long term (current) drug therapy: Secondary | ICD-10-CM | POA: Diagnosis not present

## 2021-03-08 DIAGNOSIS — D7389 Other diseases of spleen: Secondary | ICD-10-CM | POA: Diagnosis not present

## 2021-03-08 DIAGNOSIS — K5732 Diverticulitis of large intestine without perforation or abscess without bleeding: Secondary | ICD-10-CM | POA: Diagnosis not present

## 2021-03-08 DIAGNOSIS — K5792 Diverticulitis of intestine, part unspecified, without perforation or abscess without bleeding: Secondary | ICD-10-CM | POA: Diagnosis not present

## 2021-03-08 DIAGNOSIS — Z466 Encounter for fitting and adjustment of urinary device: Secondary | ICD-10-CM | POA: Diagnosis not present

## 2021-03-08 DIAGNOSIS — R42 Dizziness and giddiness: Secondary | ICD-10-CM | POA: Diagnosis not present

## 2021-03-08 DIAGNOSIS — R531 Weakness: Secondary | ICD-10-CM | POA: Diagnosis not present

## 2021-03-08 DIAGNOSIS — N400 Enlarged prostate without lower urinary tract symptoms: Secondary | ICD-10-CM | POA: Diagnosis not present

## 2021-03-08 DIAGNOSIS — Z7984 Long term (current) use of oral hypoglycemic drugs: Secondary | ICD-10-CM | POA: Diagnosis not present

## 2021-03-08 DIAGNOSIS — J84112 Idiopathic pulmonary fibrosis: Secondary | ICD-10-CM | POA: Diagnosis not present

## 2021-03-08 DIAGNOSIS — E119 Type 2 diabetes mellitus without complications: Secondary | ICD-10-CM | POA: Diagnosis not present

## 2021-03-08 DIAGNOSIS — J986 Disorders of diaphragm: Secondary | ICD-10-CM | POA: Diagnosis not present

## 2021-03-08 DIAGNOSIS — R918 Other nonspecific abnormal finding of lung field: Secondary | ICD-10-CM | POA: Diagnosis not present

## 2021-03-08 DIAGNOSIS — I714 Abdominal aortic aneurysm, without rupture: Secondary | ICD-10-CM | POA: Diagnosis not present

## 2021-03-08 DIAGNOSIS — Z794 Long term (current) use of insulin: Secondary | ICD-10-CM | POA: Diagnosis not present

## 2021-03-08 NOTE — Telephone Encounter (Signed)
Agree with ER visit.

## 2021-03-08 NOTE — Telephone Encounter (Signed)
I was unable to speak with pts wife(DPR signed); I spoke with Earnestine Mealing (do not see DPR for [facility]);  When pt stands up pt is light headed; BP low for over a week; not sure what heart rate is. Belenda Cruise is presently taking pt to [facility] ED. Belenda Cruise said pt has indwelling catheter and wonders if that is causing some type of infection. No CP,SOB or H/a. Sending note as FYI to Dr Diona Browner.

## 2021-03-08 NOTE — Telephone Encounter (Signed)
[place] Day - Client TELEPHONE ADVICE RECORD AccessNurse Patient Name: Brian Bass Gender: Male DOB: [birth year] Age: 80 Y 44 M 15 D Return Phone Number: [phone] (Primary), [phone] (Secondary) Address: City/ State/ Zip: [place] Client [place] Day - Client Client Site [place] - Day Physician [name] - MD Contact Type Call Who Is Calling Patient / Member / Family / Caregiver Call Type Triage / Clinical Caller Name Earnestine Mealing Relationship To Patient Daughter Return Phone Number [phone] (Primary) Chief Complaint BLOOD PRESSURE LOW - Systolic (top number) 90 or less Reason for Call Symptomatic / Request for [place] states her father is having low blood pressure. Caller states his blood pressure is 80/51 Translation No Nurse Assessment Nurse: Rock Nephew, RN, Juliann Pulse Date/Time (Eastern Time): 03/08/2021 10:37:00 AM Does the patient have any new or worsening symptoms? ---Yes Will a triage be completed? ---Yes Related visit to physician within the last 2 weeks? ---No Does the PT have any chronic conditions? (i.e. diabetes, asthma, this includes High risk factors for pregnancy, etc.) ---Yes List chronic conditions. ---diabetes Is this a behavioral health or substance abuse call? ---No Guidelines Guideline Title Affirmed Question Affirmed Notes Nurse Date/Time (Eastern Time) Blood Pressure - Low [9] Systolic BP < 90 AND [2] dizzy, lightheaded, or weak Wells, RN, Juliann Pulse 03/08/2021 10:38:27 AM Disp. Time Eilene Ghazi Time) Disposition Final User 03/08/2021 10:35:18 AM Send to Urgent Elease Hashimoto 03/08/2021 10:43:06 AM 911 Outcome Documentation Rock Nephew, RN, Juliann Pulse Reason: declined calling EMS, will go to ER instead 03/08/2021 10:40:04 AM Call EMS 911 Now Yes Rock Nephew, RN, Juliann Pulse PLEASE NOTE: All timestamps contained within this report are represented  as [place] Standard Time. CONFIDENTIALTY NOTICE: This fax transmission is intended only for the addressee. It contains information that is legally privileged, confidential or otherwise protected from use or disclosure. If you are not the intended recipient, you are strictly prohibited from reviewing, disclosing, copying using or disseminating any of this information or taking any action in reliance on or regarding this information. If you have received this fax in error, please notify us immediately by telephone so that we can arrange for its return to [name]. Phone: [phone], Toll-Free: [phone], Fax: [phone] Page: 2 of 3 Call Id: [id number] [place] Disagree/Comply Disagree Caller Understands Yes PreDisposition Call Doctor Care Advice Given Per Guideline CALL EMS 911 NOW: CARE ADVICE given per Low Blood Pressure (Adult) guideline. FIRST AID - LIE DOWN FOR SHOCK: * Lie down with feet elevated. * Reason: Treatment for shock. * Immediate medical attention is needed. You need to hang up and call 911 (or an ambulance). Referrals GO TO FACILITY REFUSED Triage Details User: Rock Nephew RN, Kathy Date/Time: 03/08/2021 10:38:27 AM Triage Guideline: Blood Pressure - Low Started suddenly after an allergic medicine, an allergic food, or bee sting -----No Shock suspected (e.g., cold/pale/clammy skin, too weak to stand, low BP, rapid pulse) -----No Difficult to awaken or acting confused (e.g., disoriented, slurred speech) -----No Fainted -----No [2] Systolic BP < 90 AND [8] dizzy, lightheaded, or weak -----Yes Disposition: Call EMS 911 Now CALL EMS 911 NOW: CARE ADVICE given per Low Blood Pressure (Adult) guideline. FIRST AID - LIE DOWN FOR SHOCK: * Lie down with feet elevated. * Reason: Treatment for shock. * Immediate medical attention is needed. You need to hang up and call 911 (or an ambulance). PLEASE NOTE: All timestamps contained within this report are represented as [place] Standard  Time. CONFIDENTIALTY NOTICE:  This fax transmission is intended only for the addressee. It contains information that is legally privileged, confidential or otherwise protected from use or disclosure. If you are not the intended recipient, you are strictly prohibited from reviewing, disclosing, copying using or disseminating any of this information or taking any action in reliance on or regarding this information. If you have received this fax in error, please notify us immediately by telephone so that we can arrange for its return to [name]. Phone: [phone], Toll-Free: [phone], Fax: [phone] Page: 3 of 3 Call Id: [id number] Triage Details

## 2021-03-12 ENCOUNTER — Telehealth: Payer: Self-pay

## 2021-03-12 NOTE — Telephone Encounter (Signed)
Lm for reminder of covid test prior to PFT.  03/15/2021 10:[facility]

## 2021-03-15 ENCOUNTER — Other Ambulatory Visit: Payer: Self-pay

## 2021-03-15 ENCOUNTER — Other Ambulatory Visit
Admission: RE | Admit: 2021-03-15 | Discharge: 2021-03-15 | Disposition: A | Discharge status: Home / Self Care | Payer: Medicare HMO | Source: Ambulatory Visit | Attending: Pulmonary Disease | Admitting: Pulmonary Disease

## 2021-03-15 DIAGNOSIS — Z01812 Encounter for preprocedural laboratory examination: Secondary | ICD-10-CM | POA: Insufficient documentation

## 2021-03-15 DIAGNOSIS — Z20822 Contact with and (suspected) exposure to covid-19: Secondary | ICD-10-CM | POA: Diagnosis not present

## 2021-03-15 LAB — SARS CORONAVIRUS 2 (TAT 6-24 HRS): SARS Coronavirus 2: NEGATIVE

## 2021-03-15 NOTE — Telephone Encounter (Signed)
Patient is aware of below date/time.

## 2021-03-16 ENCOUNTER — Ambulatory Visit
Admission: RE | Admit: 2021-03-16 | Discharge: 2021-03-16 | Disposition: A | Discharge status: Home / Self Care | Payer: Medicare HMO | Source: Ambulatory Visit | Attending: Pulmonary Disease | Admitting: Pulmonary Disease

## 2021-03-16 ENCOUNTER — Ambulatory Visit: Payer: Medicare HMO

## 2021-03-16 DIAGNOSIS — I358 Other nonrheumatic aortic valve disorders: Secondary | ICD-10-CM | POA: Diagnosis not present

## 2021-03-16 DIAGNOSIS — I2510 Atherosclerotic heart disease of native coronary artery without angina pectoris: Secondary | ICD-10-CM | POA: Diagnosis not present

## 2021-03-16 DIAGNOSIS — K449 Diaphragmatic hernia without obstruction or gangrene: Secondary | ICD-10-CM | POA: Diagnosis not present

## 2021-03-16 DIAGNOSIS — J84112 Idiopathic pulmonary fibrosis: Secondary | ICD-10-CM | POA: Diagnosis not present

## 2021-03-16 DIAGNOSIS — J479 Bronchiectasis, uncomplicated: Secondary | ICD-10-CM | POA: Diagnosis not present

## 2021-03-16 NOTE — Telephone Encounter (Signed)
Received call from patient's spouse, [name](DPR).  [name] stated that patient had a mixup on appointment and missed his PFT.  Rodena Piety, please reschedule. [name] would like a call prior to scheduling appt.

## 2021-03-16 NOTE — Telephone Encounter (Signed)
There are no more PFT appts for April as of right now

## 2021-03-17 ENCOUNTER — Other Ambulatory Visit: Payer: Self-pay

## 2021-03-18 ENCOUNTER — Other Ambulatory Visit (INDEPENDENT_AMBULATORY_CARE_PROVIDER_SITE_OTHER): Payer: Medicare HMO

## 2021-03-18 ENCOUNTER — Other Ambulatory Visit: Payer: Self-pay

## 2021-03-18 DIAGNOSIS — E1140 Type 2 diabetes mellitus with diabetic neuropathy, unspecified: Secondary | ICD-10-CM | POA: Diagnosis not present

## 2021-03-18 DIAGNOSIS — Z794 Long term (current) use of insulin: Secondary | ICD-10-CM

## 2021-03-18 LAB — COMPREHENSIVE METABOLIC PANEL
ALT: 17 U/L (ref 0–53)
AST: 19 U/L (ref 0–37)
Albumin: 3.6 g/dL (ref 3.5–5.2)
Alkaline Phosphatase: 77 U/L (ref 39–117)
BUN: 11 mg/dL (ref 6–23)
CO2: 28 mEq/L (ref 19–32)
Calcium: 8.8 mg/dL (ref 8.4–10.5)
Chloride: 97 mEq/L (ref 96–112)
Creatinine, Ser: 1 mg/dL (ref 0.40–1.50)
GFR: 70.97 mL/min (ref >60.00)
Glucose, Bld: 167 mg/dL — ABNORMAL HIGH (ref 70–99)
Potassium: 4.6 mEq/L (ref 3.5–5.1)
Sodium: 130 mEq/L — ABNORMAL LOW (ref 135–145)
Total Bilirubin: 0.4 mg/dL (ref 0.2–1.2)
Total Protein: 7.1 g/dL (ref 6.0–8.3)

## 2021-03-18 LAB — LIPID PANEL
Cholesterol: 109 mg/dL (ref 0–200)
HDL: 29.8 mg/dL — ABNORMAL LOW (ref >39.00)
NonHDL: 79.43
Total CHOL/HDL Ratio: 4
Triglycerides: 223 mg/dL — ABNORMAL HIGH (ref 0.0–149.0)
VLDL: 44.6 mg/dL — ABNORMAL HIGH (ref 0.0–40.0)

## 2021-03-18 LAB — HEMOGLOBIN A1C: Hgb A1c MFr Bld: 7.6 % — ABNORMAL HIGH (ref 4.6–6.5)

## 2021-03-18 LAB — MICROALBUMIN / CREATININE URINE RATIO
Creatinine,U: 48.4 mg/dL
Microalb Creat Ratio: 3 mg/g (ref 0.0–30.0)
Microalb, Ur: 1.5 mg/dL (ref 0.0–1.9)

## 2021-03-18 LAB — LDL CHOLESTEROL, DIRECT: Direct LDL: 63 mg/dL

## 2021-03-18 NOTE — Progress Notes (Signed)
No critical labs need to be addressed urgently. We will discuss labs in detail at upcoming office visit.   

## 2021-03-19 DIAGNOSIS — J849 Interstitial pulmonary disease, unspecified: Secondary | ICD-10-CM | POA: Diagnosis not present

## 2021-03-23 ENCOUNTER — Encounter: Payer: Self-pay | Admitting: Pulmonary Disease

## 2021-03-24 DIAGNOSIS — R338 Other retention of urine: Secondary | ICD-10-CM | POA: Diagnosis not present

## 2021-03-26 ENCOUNTER — Other Ambulatory Visit: Payer: Self-pay | Admitting: *Deleted

## 2021-03-26 ENCOUNTER — Encounter: Payer: Medicare HMO | Admitting: Family Medicine

## 2021-03-26 MED ORDER — ONDANSETRON HCL 4 MG PO TABS: ORAL_TABLET | ORAL | 2 refills | Status: DC

## 2021-03-29 DIAGNOSIS — R1032 Left lower quadrant pain: Secondary | ICD-10-CM | POA: Diagnosis not present

## 2021-03-29 DIAGNOSIS — Z794 Long term (current) use of insulin: Secondary | ICD-10-CM | POA: Diagnosis not present

## 2021-03-29 DIAGNOSIS — K6389 Other specified diseases of intestine: Secondary | ICD-10-CM | POA: Diagnosis not present

## 2021-03-29 DIAGNOSIS — J84112 Idiopathic pulmonary fibrosis: Secondary | ICD-10-CM | POA: Diagnosis not present

## 2021-03-29 DIAGNOSIS — K5730 Diverticulosis of large intestine without perforation or abscess without bleeding: Secondary | ICD-10-CM | POA: Diagnosis not present

## 2021-03-29 DIAGNOSIS — R0602 Shortness of breath: Secondary | ICD-10-CM | POA: Diagnosis not present

## 2021-03-29 DIAGNOSIS — K5792 Diverticulitis of intestine, part unspecified, without perforation or abscess without bleeding: Secondary | ICD-10-CM | POA: Diagnosis not present

## 2021-03-29 DIAGNOSIS — J849 Interstitial pulmonary disease, unspecified: Secondary | ICD-10-CM | POA: Diagnosis not present

## 2021-03-29 DIAGNOSIS — Z79899 Other long term (current) drug therapy: Secondary | ICD-10-CM | POA: Diagnosis not present

## 2021-03-29 DIAGNOSIS — K5900 Constipation, unspecified: Secondary | ICD-10-CM | POA: Diagnosis not present

## 2021-03-29 DIAGNOSIS — E119 Type 2 diabetes mellitus without complications: Secondary | ICD-10-CM | POA: Diagnosis not present

## 2021-03-29 DIAGNOSIS — K5732 Diverticulitis of large intestine without perforation or abscess without bleeding: Secondary | ICD-10-CM | POA: Diagnosis not present

## 2021-03-29 DIAGNOSIS — E871 Hypo-osmolality and hyponatremia: Secondary | ICD-10-CM | POA: Diagnosis not present

## 2021-03-29 DIAGNOSIS — J449 Chronic obstructive pulmonary disease, unspecified: Secondary | ICD-10-CM | POA: Diagnosis not present

## 2021-03-30 ENCOUNTER — Telehealth: Payer: Self-pay

## 2021-03-30 NOTE — Telephone Encounter (Signed)
Call Looks like he was diagnosed with diverticulitis.. has he started the antibiotics?  CIPROFLOXACIN (CIPRO) 500 MG TABLET Take one tablet (500 mg dose) by mouth every 12 (twelve) hours for 10 days.  Quantity: 20 tablet Refills: 0  METRONIDAZOLE (FLAGYL) 500 MG TABLET Take one tablet (500 mg dose) by mouth 2 (two) times daily for 10 days.  Quantity: 20 tablet Refills: 0    Have him push pedialyte or gatorade. If he is nauseous let me know and I can send in med for nausea.  He should have appt this week with me  for hospital follow upto  re-evaluate diarrhea.. please make appt. At that time we can consider a medication to stimulate appetite.

## 2021-03-30 NOTE — Telephone Encounter (Signed)
Spoke with patient and relayed Dr. Rometta Emery messages. Patient verbalized understanding. Patient stated that he has been taking the antibiotics and is drinking plenty of fluids to stay hydrated. Patient is currently feeling ok, but stated he would call if needed. Patient has a hospital follow-up appointment scheduled with Dr. Diona Browner on Friday (4/29) at 11:20.

## 2021-03-30 NOTE — Telephone Encounter (Signed)
Thanks!  Daughter wanted a call back. Was she there with patient? If not just call her to let her know talked to him.

## 2021-03-30 NOTE — Telephone Encounter (Signed)
Brian Bass (do not see her on DPR) pts daughter who lives in [place]; said that pt was seen at [facility] ED on 03/29/21 with diarrhea and not eating, weak, and dehydration. Brian Bass said pt received IV fluids at ED and pt has appt with GI in [place] on 04/28/21, Brian Bass is afraid if pt has to wait that long for GI appt pt will be back in the hospital with watery diarrhea and pt not eating. Brian Bass wants to know if Dr Diona Browner can suggest something or can get pt a sooner GI appt. Brian Bass said pt is feeling better today but after 3 bags of IV fluids on 03/29/21 she would expect him to feel better; pt is still having watery diarrhea but Brian Bass is not sure how many times pt has had diarrhea in last 12 - 24 hrs. Pt is not eating but Brian Bass said pts wife advised her he did drink some Pedialyte this morning. Sending note to Dr Diona Browner and [name] CMA. Brian Bass request cb after Dr Diona Browner reviews note, UC & ED precautions given and pt voiced understanding.

## 2021-04-02 ENCOUNTER — Telehealth: Payer: Self-pay

## 2021-04-02 ENCOUNTER — Telehealth: Payer: Self-pay | Admitting: Pulmonary Disease

## 2021-04-02 ENCOUNTER — Telehealth (INDEPENDENT_AMBULATORY_CARE_PROVIDER_SITE_OTHER): Payer: Medicare HMO | Admitting: Family Medicine

## 2021-04-02 ENCOUNTER — Telehealth: Payer: Self-pay | Admitting: Internal Medicine

## 2021-04-02 ENCOUNTER — Other Ambulatory Visit: Payer: Self-pay

## 2021-04-02 VITALS — BP 154/87 | HR 78 | Wt 145.0 lb

## 2021-04-02 DIAGNOSIS — Z794 Long term (current) use of insulin: Secondary | ICD-10-CM | POA: Diagnosis not present

## 2021-04-02 DIAGNOSIS — R932 Abnormal findings on diagnostic imaging of liver and biliary tract: Secondary | ICD-10-CM

## 2021-04-02 DIAGNOSIS — E1140 Type 2 diabetes mellitus with diabetic neuropathy, unspecified: Secondary | ICD-10-CM | POA: Diagnosis not present

## 2021-04-02 DIAGNOSIS — K5792 Diverticulitis of intestine, part unspecified, without perforation or abscess without bleeding: Secondary | ICD-10-CM | POA: Insufficient documentation

## 2021-04-02 DIAGNOSIS — R339 Retention of urine, unspecified: Secondary | ICD-10-CM | POA: Diagnosis not present

## 2021-04-02 MED ORDER — AMOXICILLIN-POT CLAVULANATE 875-125 MG PO TABS: 1.0000 | ORAL_TABLET | Freq: Two times a day (BID) | ORAL | 0 refills | Status: DC

## 2021-04-02 NOTE — Telephone Encounter (Signed)
Spoke with Brian Bass and advised. Also advised of appointment with Brian Bass on 5/16

## 2021-04-02 NOTE — Telephone Encounter (Signed)
Pt scheduled to see Dr. Hilarie Fredrickson 04/19/21 at 9:30am. Unable to get through to Dr. Rometta Emery office to let them know about the appt. No option to chose if calling from an MD office, every option I tried it stated there were system problems and to try back later and it hung up. Left message on pts voicemail regarding the appt.

## 2021-04-02 NOTE — Telephone Encounter (Signed)
Reviewed appointment information. I called patient also to try and let him know but was not able to get in touch. Thank you for working patient in.

## 2021-04-02 NOTE — Telephone Encounter (Signed)
Just take the Augmentin as it will cover for both.

## 2021-04-02 NOTE — Assessment & Plan Note (Signed)
Poor po intake and trying to keep fluids up with Pedialyte. Now able to tolerate po intake some better.. will switch to regualr diet with additional fluids of sugar free gatorade to avoid high sugars.  he will follow blood sugars close for lows if not able take a meal.

## 2021-04-02 NOTE — Assessment & Plan Note (Addendum)
Unable to remove catheter without recurrent retention. Has follow up with urology for further evaluation.  Reviewed last urology note from 4/20... Dr. Amalia Hailey.. TURP considered for bladder outflow obstruction but pt declined surgery.

## 2021-04-02 NOTE — Telephone Encounter (Signed)
Noted.

## 2021-04-02 NOTE — Assessment & Plan Note (Signed)
Possible cirrosis , appears to be new finding compared to a  Previous CT on 2017. He has been on pulmonay fibrosis medication for several years which have some increased risk for liver issues.  Refer to GI for further evaluation, likely US liver.

## 2021-04-02 NOTE — Progress Notes (Signed)
VIRTUAL VISIT:  CHANGED TO TELEPHONE VISIT Due to national recommendations of social distancing due to COVID 19, a virtual visit is felt to be most appropriate for this patient at this time.   I connected with the patient on 04/02/21 at 11:20 AM EDT by phone platform and verified that I am speaking with the correct person using two identifiers.  Interactive audio and video telecommunications were attempted between this provider and patient, however failed, due to patient having technical difficulties OR patient did not have access to video capability.  We continued and completed visit with audio only.     I discussed the limitations, risks, security and privacy concerns of performing an evaluation and management service by  virtual telehealth platform and the availability of in person appointments. I also discussed with the patient that there may be a patient responsible charge related to this service. The patient expressed understanding and agreed to proceed.  Patient location: Home Provider Location: [facility] Participants: [name] and [name]   Chief Complaint  Patient presents with  . Hospitalization Follow-up    Diverticulitis     History of Present Illness: 80 year old male with IPF requiring oxygen DM, gut, GERD  Presents for hospital follow up from diverticulitis.  ED visit on 03/29/21 for weakness and N/V/D.[name] found to have diverticulitis. CT ABD/pelvis: IMPRESSION: 1. Diverticulosis minimal possible early diverticulitis adjacent to the left pelvic sidewall and adjacent to the urinary bladder. No free air abscess with large phlegmon. 2. No coronary aortic and iliac atherosclerosis with abdominal aortic aneurysm unchanged. 3. Possible 6 hepatic cirrhosis. 4. Chronic COPD and interstitial lung disease. 5. Other findings are stable as well.  Started on metronidazole and cipro x 10 days... but he cannot take cipro gives him severe joint  swelling. Instructed to follow up with GI in 1 week.   Impression:  IMPRESSION: 1. Diverticulosis minimal possible early diverticulitis adjacent to the left pelvic sidewall and adjacent to the urinary bladder. No free air abscess with large phlegmon. 2. No coronary aortic and iliac atherosclerosis with abdominal aortic aneurysm unchanged. 3. Possible  hepatic cirrhosis: : Small, slightly fatty, with irregular surface and relative enlargement of the caudate lobe suggesting cirrhosis. 4. Chronic COPD and interstitial lung disease. 5. Other findings are stable as well.    He was given Cipro and metronidazole... had to stop cipro given causes severe joint pain.  He has been having some improvement in diarrhea in last 4 days now.. now incontinence, stool is firmer, BM now 2 times daily. No abdominal pain.  Still has some nausea from IPF med, but no severe nausea.   Weakness improve.. he is getting out more.   He is currently following with urology.. 5/15 for inability to urinate... has foley cath in place.  FBS  in AM Post prandial 230 after pedialyte Poor appetite  Reviewed hospital discharge summary and imaging reports in detail.  COVID 19 screen No recent travel or known exposure to COVID19 The patient denies respiratory symptoms of COVID 19 at this time.  The importance of social distancing was discussed today.   Review of Systems  Constitutional: Negative for chills and fever.  HENT: Negative for congestion and ear pain.   Eyes: Negative for pain and redness.  Respiratory: Negative for cough and shortness of breath.   Cardiovascular: Negative for chest pain, palpitations and leg swelling.  Gastrointestinal: Negative for abdominal pain, blood in stool, constipation, diarrhea, nausea and vomiting.  Genitourinary: Negative for  dysuria.  Musculoskeletal: Negative for falls and myalgias.  Skin: Negative for rash.  Neurological: Negative for dizziness.  Psychiatric/Behavioral:  Negative for depression. The patient is not nervous/anxious.       Past Medical History:  Diagnosis Date  . Adenomatous colon polyp   . Allergy   . Arthritis   . Cataract   . Complication of anesthesia    Per pt, hard to wake up past last colon in 2011!  . Diabetes mellitus   . Esophageal stricture   . GERD (gastroesophageal reflux disease)   . History of colon polyps 922/2011  . Hyperlipidemia   . IBS (irritable bowel syndrome)   . Pulmonary fibrosis ([place])     reports that he has never smoked. He has never used smokeless tobacco. He reports that he does not drink alcohol and does not use drugs.   Current Outpatient Medications:  .  ACCU-CHEK FASTCLIX LANCETS MISC, 1 Device by Other route 3 (three) times daily. Check blood sugars 2 to 3 times daily.  Dx: 250.02, Disp: 102 each, Rfl: 5 .  ACCU-CHEK SMARTVIEW test strip, TEST BLOOD SUGAR 2 TO 3 TIMES DAILY, Disp: 300 strip, Rfl: 3 .  benzonatate (TESSALON) 100 MG capsule, TAKE 2 CAPSULES BY MOUTH 3 TIMES DAILY, Disp: 180 capsule, Rfl: 3 .  Blood Glucose Monitoring Suppl (ACCU-CHEK NANO SMARTVIEW) W/DEVICE KIT, 1 kit by Other route 3 (three) times daily. Use to check blood sugars 2 to 3 times daily.  Dx: 250.02, Disp: 1 kit, Rfl: 0 .  Budeson-Glycopyrrol-Formoterol (BREZTRI AEROSPHERE) 160-9-4.8 MCG/ACT AERO, Inhale 2 puffs into the lungs in the morning and at bedtime., Disp: 10.7 g, Rfl: 11 .  cholestyramine light (PREVALITE) 4 g packet, Take 1 packet (4 g total) by mouth 2 (two) times daily as needed (Diarrhea)., Disp: 60 each, Rfl: 3 .  ezetimibe (ZETIA) 10 MG tablet, TAKE 1 TABLET BY MOUTH DAILY, Disp: 90 tablet, Rfl: 3 .  fenofibrate 160 MG tablet, Take 1 tablet (160 mg total) by mouth daily., Disp: 90 tablet, Rfl: 1 .  gabapentin (NEURONTIN) 100 MG capsule, Take 1 capsule (100 mg total) by mouth at bedtime. (Patient taking differently: Take 300 mg by mouth at bedtime.), Disp: 30 capsule, Rfl: 2 .  hydrOXYzine (VISTARIL) 25 MG  capsule, Take 1 capsule (25 mg total) by mouth 3 (three) times daily as needed for anxiety or nausea., Disp: 30 capsule, Rfl: 0 .  Insulin Syringe-Needle U-100 (DROPLET INSULIN SYRINGE) 31G X 5/16" 0.3 ML MISC, USE TO INJECT LANTUS INSULIN 2 TIMES DAILY, Disp: 200 each, Rfl: 3 .  LANTUS 100 UNIT/ML injection, INJECT 55 UNITS SQ TWICE A DAY, Disp: 100 mL, Rfl: 0 .  meloxicam (MOBIC) 7.5 MG tablet, Take 1 tablet (7.5 mg total) by mouth daily., Disp: 30 tablet, Rfl: 0 .  metroNIDAZOLE (FLAGYL) 500 MG tablet, Take 500 mg by mouth 2 (two) times daily., Disp: , Rfl:  .  MITIGARE 0.6 MG CAPS, FOR FLARE TAKE 2 TABLETS ONCE THEN REPEAT 1 TABLET IN 2 HOURS IF PAINCONTINUES, THEN 1 TABLET DAILY, Disp: 60 capsule, Rfl: 0 .  Multiple Vitamin (MULTIVITAMIN) tablet, Take 1 tablet by mouth daily., Disp: , Rfl:  .  Nintedanib (OFEV) 150 MG CAPS, Take 150 mg by mouth 2 (two) times daily., Disp: , Rfl:  .  omeprazole (PRILOSEC) 20 MG capsule, TAKE 1 CAPSULE EVERY DAY, Disp: 90 capsule, Rfl: 3 .  ondansetron (ZOFRAN) 4 MG tablet, TAKE ONE TABLET BY MOUTH EVERY DAY AS NEEDED  FOR NAUSEA, Disp: 30 tablet, Rfl: 2 .  simvastatin (ZOCOR) 40 MG tablet, Take 1 tablet (40 mg total) by mouth at bedtime., Disp: 90 tablet, Rfl: 2 .  tamsulosin (FLOMAX) 0.4 MG CAPS capsule, TAKE 1 CAPSULE EVERY DAY IF NOT IMPROVING INCREASE TO TAKE 2 CAPSULES EVERY DAY, Disp: 60 capsule, Rfl: 5 .  ciprofloxacin (CIPRO) 500 MG tablet, SMARTSIG:1 Tablet(s) By Mouth Every 12 Hours (Patient not taking: Reported on 04/02/2021), Disp: , Rfl:    Observations/Objective: Blood pressure (!) 154/87, pulse 78, weight 145 lb (65.8 kg), SpO2 99 %.  Physical Exam  Physical Exam Constitutional:      General: The patient is not in acute distress. Pulmonary:     Effort: Pulmonary effort is normal. No respiratory distress.  Neurological:     Mental Status: The patient is alert and oriented to person, place, and time.  Psychiatric:        Mood and Affect: Mood  normal.        Behavior: Behavior normal.   Assessment and Plan    I discussed the assessment and treatment plan with the patient. The patient was provided an opportunity to ask questions and all were answered. The patient agreed with the plan and demonstrated an understanding of the instructions.   The patient was advised to call back or seek an in-person evaluation if the symptoms worsen or if the condition fails to improve as anticipated.     [name], MD

## 2021-04-02 NOTE — Telephone Encounter (Signed)
Patient's wife, [name], wanted to clarify if patient should still take his antibiotics from the hospital or just to take the Augmentin that was prescribed today by Dr Diona Browner? Please let he know. Thank you

## 2021-04-02 NOTE — Assessment & Plan Note (Signed)
Atypical symptoms for diverticulitis.[name] no abd pain per pt. Improving symptoms with 4 days of metronidazole only... he was unable to tolerate cipro.  Given no easy replacement of CIPRO... will hold metronidazole and continue to hold cipro.. change to Augmentin for full cover of anaerobes and aerobes.  Referral placed for GI follow up as recommended at hospital.

## 2021-04-05 NOTE — Telephone Encounter (Signed)
error 

## 2021-04-08 ENCOUNTER — Telehealth: Payer: Self-pay | Admitting: Pulmonary Disease

## 2021-04-08 NOTE — Telephone Encounter (Signed)
Noted.  Will close encounter.  

## 2021-04-08 NOTE — Telephone Encounter (Signed)
Lm for pt for reminder of covid test prior to PFT.  04[facility]12/[facility]21 [facility]

## 2021-04-08 NOTE — Telephone Encounter (Signed)
Informed patient of covid testing time and date.

## 2021-04-12 ENCOUNTER — Other Ambulatory Visit: Payer: Self-pay

## 2021-04-12 ENCOUNTER — Other Ambulatory Visit
Admission: RE | Admit: 2021-04-12 | Discharge: 2021-04-12 | Disposition: A | Discharge status: Home / Self Care | Payer: Medicare HMO | Source: Ambulatory Visit | Attending: Pulmonary Disease | Admitting: Pulmonary Disease

## 2021-04-12 DIAGNOSIS — Z20822 Contact with and (suspected) exposure to covid-19: Secondary | ICD-10-CM | POA: Diagnosis not present

## 2021-04-12 DIAGNOSIS — Z01812 Encounter for preprocedural laboratory examination: Secondary | ICD-10-CM | POA: Diagnosis not present

## 2021-04-12 LAB — SARS CORONAVIRUS 2 (TAT 6-24 HRS): SARS Coronavirus 2: NEGATIVE

## 2021-04-13 ENCOUNTER — Telehealth: Payer: Self-pay

## 2021-04-13 ENCOUNTER — Ambulatory Visit: Payer: Medicare HMO | Attending: Pulmonary Disease

## 2021-04-13 DIAGNOSIS — J84112 Idiopathic pulmonary fibrosis: Secondary | ICD-10-CM | POA: Insufficient documentation

## 2021-04-13 MED ORDER — ALBUTEROL SULFATE (2.5 MG/3ML) 0.083% IN NEBU
2.5000 mg | INHALATION_SOLUTION | Freq: Once | RESPIRATORY_TRACT | Status: AC
  Administered 2021-04-13: 2.5 mg via RESPIRATORY_TRACT
  Filled 2021-04-13: qty 3

## 2021-04-13 NOTE — Telephone Encounter (Signed)
We do not have supplies here and do this rarely.. it would be better for him to call the urologist to have them do it for him.

## 2021-04-13 NOTE — Telephone Encounter (Signed)
Wife Brian Bass called wanting to know if she can bring patient into the office for you to change his catheter as her daughter is out of town and she has been the person taking care of this for patient... it has been inserted for about 6 weeks and he does not have home health  Please advise

## 2021-04-13 NOTE — Telephone Encounter (Signed)
Brian Bass notified as instructed by telephone.  She states understanding.

## 2021-04-15 ENCOUNTER — Other Ambulatory Visit: Payer: Self-pay

## 2021-04-15 ENCOUNTER — Ambulatory Visit: Payer: Medicare HMO | Admitting: Pulmonary Disease

## 2021-04-15 ENCOUNTER — Encounter: Payer: Self-pay | Admitting: Pulmonary Disease

## 2021-04-15 ENCOUNTER — Ambulatory Visit (INDEPENDENT_AMBULATORY_CARE_PROVIDER_SITE_OTHER): Payer: Medicare HMO

## 2021-04-15 VITALS — BP 128/70 | HR 77 | Temp 97.5°F | Ht 66.0 in | Wt 140.0 lb

## 2021-04-15 DIAGNOSIS — I2729 Other secondary pulmonary hypertension: Secondary | ICD-10-CM

## 2021-04-15 DIAGNOSIS — R0609 Other forms of dyspnea: Secondary | ICD-10-CM

## 2021-04-15 DIAGNOSIS — R059 Cough, unspecified: Secondary | ICD-10-CM | POA: Diagnosis not present

## 2021-04-15 DIAGNOSIS — J84112 Idiopathic pulmonary fibrosis: Secondary | ICD-10-CM

## 2021-04-15 DIAGNOSIS — R634 Abnormal weight loss: Secondary | ICD-10-CM

## 2021-04-15 DIAGNOSIS — R0600 Dyspnea, unspecified: Secondary | ICD-10-CM | POA: Diagnosis not present

## 2021-04-15 DIAGNOSIS — Z5181 Encounter for therapeutic drug level monitoring: Secondary | ICD-10-CM

## 2021-04-15 NOTE — Progress Notes (Signed)
Subjective:    Patient ID: Brian Bass, male    DOB: Jan 28, 1941, 80 y.o.   MRN: [id number]   Chief Complaint  Patient presents with  . Follow-up    Recent CT and PFT--C/o sob with exertion and prod cough with clear sputum.      HPI   Patient is an 80 year old lifelong never smoker with IPF who follows here for the same.  This is scheduled visit.  His symptoms are actually at baseline.  His main issue is that since he started Ofev he has lost significant amount of weight.  He continues to be able to participate in activities of daily living.  He has not had any fevers, chills or sweats.  No nausea or vomiting.  No abdominal pain.  Most recent PFTs, echocardiogram and chest CT show relative stability of disease.  DATA:  Serology 02/27/18>>ACE, rheumatoid factor, ANA negative.  PFTs 02/27/18:FVC is 63% predicted, FEV1 of 70% predicted, ratio 86% predicted. There is no significant change with bronchodilator therapy. Flow volume loop is consistent with restriction.DLCO is 54% predicted, DLCO is reduced at 56%.  CT high-resolution 02/27/18>>the bibasilar fibrotic changes have advanced significantly in the subpleural areas of traction bronchiectasis changes have worsened. Right paratracheal lymphadenopathy minimally changed.  Chest x-ray 02/08/18;diffuse groundglass and interstitial changes throughout both lungs, right diaphragm is elevated. Interstitial changes has progressed significantly since previous chest x-ray on 09/06/13. CT chest 10/21/13, bilateral emphysematous changes, bilateral subpleural reticular changes consistent with pulmonary fibrosis.   Chemistry 01/23/2019>>AST, ALT normal.  Overnight oximetry 10/08/2018>>performed on room air; time less than or equal to 88% was 0.7 minutes. Oxygen was not required.  ONOX12/08/2019 no significant desaturations though cyclical desats to 44% were noted.  CT chest high resolution 3/3/2021persistent severe fibrotic changes  unchanged from 3/19 however significantly progressed from 2014.  2D echo 02/05/2020 LVEF 60 to 65%, moderate pulmonary hypertension PFTs 04/13/2021: Restrictive physiology of moderate degree, FEV1 1.38 L or 58% predicted, FVC 1.82 L or 53% predicted.  FEV1/FVC 76%, no bronchodilator response.  Diffusion capacity moderately impaired.   Review of Systems A 10 point review of systems was performed and it is as noted above otherwise negative.  Patient Active Problem List   Diagnosis Date Noted  . Diverticulitis 04/02/2021  . Abnormal CT of liver 04/02/2021  . Urinary retention 04/02/2021  . Joint pain 09/09/2020  . Atherosclerosis of native coronary artery of native heart with stable angina pectoris ([place]) 03/23/2019  . Elevated PSA 01/29/2019  . Diabetic polyneuropathy associated with type 2 diabetes mellitus ([place]) 01/29/2019  . Positive cardiac stress test 03/06/2018  . DOE (dyspnea on exertion) 02/08/2018  . Benign prostatic hyperplasia with nocturia 02/08/2018  . Counseling regarding end of life decision making 07/14/2015  . GERD (gastroesophageal reflux disease) 12/13/2013  . Diffuse Parynchymal Lung Disease 11/08/2013  . CKD (chronic kidney disease) stage 2, GFR 60-89 ml/min 09/06/2013  . Idiopathic gout of multiple sites 12/03/2009  . CAD (coronary artery disease) 11/02/2009  . Type 2 diabetes mellitus with diabetic neuropathy, with long-term current use of insulin ([place]) 08/26/2009  . Hyperlipidemia associated with type 2 diabetes mellitus ([place]) 08/26/2009  . Hypertension associated with diabetes ([place]) 08/26/2009  . OSTEOARTHRITIS, MULTIPLE JOINTS 08/26/2009  . ESOPHAGEAL STRICTURE 10/14/2008  . IRRITABLE BOWEL SYNDROME 09/02/2008  . PERSONAL HX COLONIC POLYPS 09/02/2008   Social History   Tobacco Use  . Smoking status: Never Smoker  . Smokeless tobacco: Never Used  Substance Use Topics  . Alcohol use:  No   Allergies  Allergen Reactions  . Ciprofloxacin Swelling      Severe joint pain and swelling    Current Meds  Medication Sig  . ACCU-CHEK FASTCLIX LANCETS MISC 1 Device by Other route 3 (three) times daily. Check blood sugars 2 to 3 times daily.  Dx: 250.02  . ACCU-CHEK SMARTVIEW test strip TEST BLOOD SUGAR 2 TO 3 TIMES DAILY  . benzonatate (TESSALON) 100 MG capsule TAKE 2 CAPSULES BY MOUTH 3 TIMES DAILY  . Blood Glucose Monitoring Suppl (ACCU-CHEK NANO SMARTVIEW) W/DEVICE KIT 1 kit by Other route 3 (three) times daily. Use to check blood sugars 2 to 3 times daily.  Dx: 250.02  . Budeson-Glycopyrrol-Formoterol (BREZTRI AEROSPHERE) 160-9-4.8 MCG/ACT AERO Inhale 2 puffs into the lungs in the morning and at bedtime.  . cholestyramine light (PREVALITE) 4 g packet Take 1 packet (4 g total) by mouth 2 (two) times daily as needed (Diarrhea).  . ezetimibe (ZETIA) 10 MG tablet TAKE 1 TABLET BY MOUTH DAILY  . fenofibrate 160 MG tablet Take 1 tablet (160 mg total) by mouth daily.  [name] gabapentin (NEURONTIN) 100 MG capsule Take 1 capsule (100 mg total) by mouth at bedtime. (Patient taking differently: Take 300 mg by mouth at bedtime.)  . hydrOXYzine (VISTARIL) 25 MG capsule Take 1 capsule (25 mg total) by mouth 3 (three) times daily as needed for anxiety or nausea.  . Insulin Syringe-Needle U-100 (DROPLET INSULIN SYRINGE) 31G X 5/16" 0.3 ML MISC USE TO INJECT LANTUS INSULIN 2 TIMES DAILY  . LANTUS 100 UNIT/ML injection INJECT 55 UNITS SQ TWICE A DAY  . meloxicam (MOBIC) 7.5 MG tablet Take 1 tablet (7.5 mg total) by mouth daily.  [name] MITIGARE 0.6 MG CAPS FOR FLARE TAKE 2 TABLETS ONCE THEN REPEAT 1 TABLET IN 2 HOURS IF PAINCONTINUES, THEN 1 TABLET DAILY  . Multiple Vitamin (MULTIVITAMIN) tablet Take 1 tablet by mouth daily.  . Nintedanib (OFEV) 150 MG CAPS Take 150 mg by mouth 2 (two) times daily.  [name] omeprazole (PRILOSEC) 20 MG capsule TAKE 1 CAPSULE EVERY DAY  . ondansetron (ZOFRAN) 4 MG tablet TAKE ONE TABLET BY MOUTH EVERY DAY AS NEEDED FOR NAUSEA  . simvastatin  (ZOCOR) 40 MG tablet Take 1 tablet (40 mg total) by mouth at bedtime.  . tamsulosin (FLOMAX) 0.4 MG CAPS capsule TAKE 1 CAPSULE EVERY DAY IF NOT IMPROVING INCREASE TO TAKE 2 CAPSULES EVERY DAY  . [DISCONTINUED] amoxicillin-clavulanate (AUGMENTIN) 875-125 MG tablet Take 1 tablet by mouth 2 (two) times daily.   Immunization History  Administered Date(s) Administered  . Fluad Quad(high Dose 65+) 08/08/2019, 09/30/2020  . Influenza Split 10/05/2011, 10/11/2012  . Influenza Whole 11/12/2010  . Influenza,inj,Quad PF,6+ Mos 09/06/2013, 09/24/2014, 10/02/2015, 09/13/2016, 09/29/2017, 08/24/2018  . PFIZER(Purple Top)SARS-COV-2 Vaccination 01/22/2020, 02/12/2020  . Pneumococcal Conjugate-13 01/12/2018  . Pneumococcal Polysaccharide-23 12/06/2007  . Td 08/10/2010, 04/10/2020       Objective:   Physical Exam BP 128/70 (BP Location: Left Arm, Cuff Size: Normal)   Pulse 77   Temp (!) 97.5 F (36.4 C) (Temporal)   Ht _0  (1.676 m)   Wt 140 lb (63.5 kg)   SpO2 95%   BMI 22.60 kg/m  GENERAL:  Thin, awake and alert, no respiratory distress.  No conversational dyspnea.  He presents in transport chair due to dyspnea. HEAD: Normocephalic, atraumatic.  EYES: Pupils equal, round, reactive to light.  No scleral icterus.  MOUTH: Nose/mouth/throat not examined due to masking requirements for COVID 19. NECK: Supple.  No thyromegaly. Trachea midline. No JVD.  No adenopathy. PULMONARY: Good air entry bilaterally.   Mild Velcro crackles at bases otherwise, no adventitious sounds. CARDIOVASCULAR: S1 and S2. Regular rate and rhythm.  Grade 2/6 systolic ejection murmur at the left sternal border, no gallop.  No rubs. ABDOMEN:.  Benign. MUSCULOSKELETAL: No joint deformity, no clubbing, no edema.  NEUROLOGIC: No overt focal deficits.  Gait not tested.  Speech is fluent. SKIN: Intact,warm,dry.  On limited exam no rashes or lesions. PSYCH: Mood and behavior are appropriate.  The patient performed a 6-minute  walk today.  He ambulated 170 m with only complaint of shortness of breath that was mild (Borg scale 2).  Resting sat was 98%.  Nadir was 93% towards end of the study.  Patient was limited mostly by fatigue.     Assessment & Plan:     ICD-10-CM   1. IPF (idiopathic pulmonary fibrosis) (HCC)  J84.112    Appears to be holding with Ofev Continue Ofev at reduced dose as below   2. DOE (dyspnea on exertion)  R06.00 ECHOCARDIOGRAM COMPLETE   No evidence of desaturation during activity Related to IPF and deconditioning  3. Other secondary pulmonary hypertension (HCC)  I27.29    Secondary pulmonary hypertension Continue nocturnal oxygen supplementation  4. Therapeutic drug monitoring  Z51.81 Hepatic function panel   Check hepatic function panel On Ofev  5. Cough  R05.9    Fairly well controlled Breztri helps Tessalon Perles help  6. Weight loss  R63.4    May be related to Ofev Decrease to 1 capsule daily   Orders Placed This Encounter  Procedures  . Hepatic function panel    Standing Status:   Future    Standing Expiration Date:   04/15/2022  . ECHOCARDIOGRAM COMPLETE    Standing Status:   Future    Standing Expiration Date:   10/16/2021    Scheduling Instructions:     48mo   Order Specific Question:   Where should this test be performed    Answer:   [facility]    Order Specific Question:   Perflutren DEFINITY (image enhancing agent) should be administered unless hypersensitivity or allergy exist    Answer:   Administer Perflutren    Order Specific Question:   Reason for exam-Echo    Answer:   Dyspnea  R06.00    We will see the patient in follow-up in 3 months time he is to contact [facility] to that time should any new difficulties arise. We will obtain his echocardiogram closer to his follow-up visit.   CRenold Don MD [facility]   *This note was dictated using voice recognition software/Dragon.  Despite best efforts to proofread, errors can occur which can  change the meaning.  Any change was purely unintentional.

## 2021-04-15 NOTE — Patient Instructions (Signed)
Lets decrease the Ofev to once daily to see if this helps his appetite.  Use oxygen and with exertion and sleep, he may be off oxygen at other times.  We will see him in follow-up in 3 months time we will schedule an echocardiogram (heart test) and a liver test (blood test) closer to the time of that visit.  Call sooner should there be any issues.

## 2021-04-16 ENCOUNTER — Encounter: Payer: Self-pay | Admitting: *Deleted

## 2021-04-18 DIAGNOSIS — J849 Interstitial pulmonary disease, unspecified: Secondary | ICD-10-CM | POA: Diagnosis not present

## 2021-04-19 ENCOUNTER — Encounter: Payer: Self-pay | Admitting: Internal Medicine

## 2021-04-19 ENCOUNTER — Ambulatory Visit: Payer: Medicare HMO | Admitting: Internal Medicine

## 2021-04-19 ENCOUNTER — Other Ambulatory Visit (INDEPENDENT_AMBULATORY_CARE_PROVIDER_SITE_OTHER): Payer: Medicare HMO

## 2021-04-19 VITALS — BP 118/70 | HR 98 | Ht 66.0 in | Wt 139.0 lb

## 2021-04-19 DIAGNOSIS — Z8719 Personal history of other diseases of the digestive system: Secondary | ICD-10-CM | POA: Diagnosis not present

## 2021-04-19 DIAGNOSIS — R932 Abnormal findings on diagnostic imaging of liver and biliary tract: Secondary | ICD-10-CM | POA: Diagnosis not present

## 2021-04-19 DIAGNOSIS — R195 Other fecal abnormalities: Secondary | ICD-10-CM

## 2021-04-19 DIAGNOSIS — Z860100 Personal history of colon polyps, unspecified: Secondary | ICD-10-CM

## 2021-04-19 DIAGNOSIS — K219 Gastro-esophageal reflux disease without esophagitis: Secondary | ICD-10-CM

## 2021-04-19 DIAGNOSIS — R110 Nausea: Secondary | ICD-10-CM

## 2021-04-19 LAB — COMPREHENSIVE METABOLIC PANEL
ALT: 22 U/L (ref 0–53)
AST: 20 U/L (ref 0–37)
Albumin: 4.1 g/dL (ref 3.5–5.2)
Alkaline Phosphatase: 92 U/L (ref 39–117)
BUN: 13 mg/dL (ref 6–23)
CO2: 26 mEq/L (ref 19–32)
Calcium: 9.5 mg/dL (ref 8.4–10.5)
Chloride: 97 mEq/L (ref 96–112)
Creatinine, Ser: 1.01 mg/dL (ref 0.40–1.50)
GFR: 70.09 mL/min (ref >60.00)
Glucose, Bld: 136 mg/dL — ABNORMAL HIGH (ref 70–99)
Potassium: 4.6 mEq/L (ref 3.5–5.1)
Sodium: 131 mEq/L — ABNORMAL LOW (ref 135–145)
Total Bilirubin: 0.8 mg/dL (ref 0.2–1.2)
Total Protein: 7.6 g/dL (ref 6.0–8.3)

## 2021-04-19 LAB — CBC WITH DIFFERENTIAL/PLATELET
Basophils Absolute: 0 10*3/uL (ref 0.0–0.1)
Basophils Relative: 0.1 % (ref 0.0–3.0)
Eosinophils Absolute: 0.1 10*3/uL (ref 0.0–0.7)
Eosinophils Relative: 1 % (ref 0.0–5.0)
HCT: 38.2 % — ABNORMAL LOW (ref 39.0–52.0)
Hemoglobin: 13.1 g/dL (ref 13.0–17.0)
Lymphocytes Relative: 21.9 % (ref 12.0–46.0)
Lymphs Abs: 2.1 10*3/uL (ref 0.7–4.0)
MCHC: 34.4 g/dL (ref 30.0–36.0)
MCV: 89.2 fl (ref 78.0–100.0)
Monocytes Absolute: 0.8 10*3/uL (ref 0.1–1.0)
Monocytes Relative: 8.3 % (ref 3.0–12.0)
Neutro Abs: 6.7 10*3/uL (ref 1.4–7.7)
Neutrophils Relative %: 68.7 % (ref 43.0–77.0)
Platelets: 209 10*3/uL (ref 150.0–400.0)
RBC: 4.29 Mil/uL (ref 4.22–5.81)
RDW: 14.2 % (ref 11.5–15.5)
WBC: 9.7 10*3/uL (ref 4.0–10.5)

## 2021-04-19 LAB — PROTIME-INR
INR: 1.1 ratio — ABNORMAL HIGH (ref 0.8–1.0)
Prothrombin Time: 11.9 s (ref 9.6–13.1)

## 2021-04-19 MED ORDER — ONDANSETRON HCL 4 MG PO TABS: 4.0000 mg | ORAL_TABLET | Freq: Three times a day (TID) | ORAL | 2 refills | Status: DC | PRN

## 2021-04-19 MED ORDER — CHOLESTYRAMINE 4 G PO PACK: 4.0000 g | PACK | Freq: Every day | ORAL | 2 refills | Status: DC

## 2021-04-19 MED ORDER — OMEPRAZOLE 20 MG PO CPDR: 1.0000 | DELAYED_RELEASE_CAPSULE | Freq: Two times a day (BID) | ORAL | 1 refills | Status: DC

## 2021-04-19 NOTE — Patient Instructions (Addendum)
Your provider has requested that you go to the basement level for lab work before leaving today. Press "B" on the elevator. The lab is located at the first door on the left as you exit the elevator.  We have sent the following medications to your pharmacy for you to pick up at your convenience: Cholestyramine 4 grams daily-- please separate this medication at least 2 hours apart from all other medications.  Zofran 4 mg three times daily as needed  Omeprazole twice daily before meals (increase from previous dosage)  Please purchase the following medications over the counter and take as directed: Imodium OR Pepto Bismul ONLY as a back up as needed to cholestryamine  Please follow up with Brian Bass on 07/20/21 at 11:00 am.

## 2021-04-19 NOTE — Progress Notes (Signed)
Subjective:    Patient ID: Brian Bass, male    DOB: [birth year], 80 y.o.   MRN: [id number]  HPI Brian Bass is an 80 year old male with a history of GERD with Schatzki's ring status post dilation in 2020, multiple adenomatous colon polyps, colonic diverticulosis, IPF on oxygen, diabetes, gout who is seen for follow-up at the request of Dr. Diona Browner to discuss abnormal liver imaging.  He is here today with his wife.  He is known to me from prior evaluations including upper endoscopy and colonoscopy performed in March 2020. --EGD revealed a Schatzki's ring and a small hiatal hernia; dilation was performed to 16.5 mm in the lower esophagus across the GE junction -- Colonoscopy revealed 9 adenomatous colon polyps  He reports that he was treated recently for diverticulitis after a CT scan which was performed at [facility] on 03/29/2021 revealed diverticulosis with "possible early diverticulitis adjacent to the left pelvic sidewall and adjacent to the urinary bladder".  He was originally prescribed Cipro and Flagyl but this was later changed to Augmentin which she completed recently.  This test was done for left lower quadrant abdominal discomfort.  He has had resolution of abdominal pain.  He has had ongoing issues with urinary retention and is currently wearing a Foley catheter with leg bag.  This has been ongoing for 2 to 3 months.  He is seeing urology tomorrow.  From a GI perspective he states that he has frequent loose stools and certain days where he has diarrhea.  He will have 1-2 loose stools daily without blood or melena.  If his stools are occurring multiple times a day he will use Imodium and/or Pepto-Bismol.  He has some nausea symptom for which Zofran is very effective.  No vomiting.  His heartburn is partially controlled and he will use Tums on a fairly regular basis.  He has been using omeprazole 20 mg once daily.  Cholestyramine is on his medication list but he does not  recall ever having tried this medication.  CT scan recently raised the question of cirrhosis and that the liver appeared small, slightly fatty with an irregular surface and relative enlargement of the caudate lobe.  Patient denies jaundice, abdominal and lower extremity swelling.  Wife denies confusion.  No family history of cirrhosis.  He has never been an alcohol drinker.   Review of Systems As per HPI, otherwise negative  Current Medications, Allergies, Past Medical History, Past Surgical History, Family History and Social History were reviewed in [employer] record.     Objective:   Physical Exam BP 118/70 (BP Location: Left Arm, Patient Position: Sitting, Cuff Size: Small)   Pulse 98   Ht _0  (1.676 m)   Wt 139 lb (63 kg)   SpO2 99%   BMI 22.44 kg/m  Gen: awake, alert, NAD HEENT: anicteric, op clear CV: RRR, no mrg Pulm: CTA b/l Abd: soft, NT/ND, +BS throughout, no hsm Ext: no c/c/e Neuro: nonfocal, no asterixis  CMP     Component Value Date/Time   NA 130 (L) 03/18/2021 0838   NA 131 (L) 06/18/2020 0000   K 4.6 03/18/2021 0838   CL 97 03/18/2021 0838   CO2 28 03/18/2021 0838   GLUCOSE 167 (H) 03/18/2021 0838   BUN 11 03/18/2021 0838   BUN 15 06/18/2020 0000   CREATININE 1.00 03/18/2021 0838   CALCIUM 8.8 03/18/2021 0838   PROT 7.1 03/18/2021 0838   PROT 7.4 06/18/2020 0000   ALBUMIN  3.6 03/18/2021 0838   ALBUMIN 3.9 06/18/2020 0000   AST 19 03/18/2021 0838   ALT 17 03/18/2021 0838   ALKPHOS 77 03/18/2021 0838   BILITOT 0.4 03/18/2021 0838   BILITOT 0.5 06/18/2020 0000   GFRNONAA >60 07/23/2020 1053   GFRAA >60 07/23/2020 1053   CBC    Component Value Date/Time   WBC 13.2 (H) 06/18/2020 0000   WBC 10.2 05/26/2020 1140   RBC 4.46 06/18/2020 0000   RBC 4.78 05/26/2020 1140   HGB 13.8 06/18/2020 0000   HCT 38.9 06/18/2020 0000   PLT 322 06/18/2020 0000   MCV 87 06/18/2020 0000   MCH 30.9 06/18/2020 0000   MCH 29.9 10/14/2018  0656   MCHC 35.5 06/18/2020 0000   MCHC 34.0 05/26/2020 1140   RDW 12.6 06/18/2020 0000   LYMPHSABS 4.1 (H) 06/18/2020 0000   MONOABS 0.9 05/26/2020 1140   EOSABS 0.3 06/18/2020 0000   BASOSABS 0.0 06/18/2020 0000   CT ABDOMEN PELVIS W IV CONTRAST - Radiation dose reduction was utilized (automated exposure control, mA or kV adjustment based on patient size, or iterative image reconstruction).   FINDINGS:   VISUALIZED LOWER THORAX:  COPD with fibrosis. Old rib fractures with nonunion.  Heavy coronary atherosclerosis. Aortic valve calcification. Prior median sternotomy. Hiatal hernia.   SOLID VISCERA:  Liver: Small, slightly fatty, with irregular surface and relative enlargement of the caudate lobe suggesting cirrhosis.  Gallbladder: Normal  Pancreas: Normal.  Adrenal glands: Normal.  Spleen: Normal.  Kidneys: Small cyst right kidney anteriorly. Extrarenal pelvis bilaterally. No stones masses or hydronephrosis.   GI:  No bowel obstruction. Diverticulosis. There is mild wall thickening in the left colon and sigmoid colon. Mild stool retention in the rectum. There is no large phlegmon or abscess. This small amount of fluid adjacent to the external iliac vessels suggesting very mild diverticular disease or early diverticulitis. This also some wall thickening and edema in the region adjacent urinary bladder also without distinct phlegmon or abscess. Colonic interposition.  No focal bowel wall thickening or inflammatory changes. Terminal ileum is normal.  Appendix is normal.   PERITONEAL CAVITY/RETROPERITONEUM:  No free fluid.  No pneumoperitoneum.  No lymphadenopathy.  Abdominal aortic aneurysm again noted measures 3.2 x 2.9 cm with mural thrombus and displaced intimal calcifications iliac arteries are also enlarged and atherosclerotic.   PELVIS:  No acute abnormalities. Foley catheter in the bladder.   MUSCULOSKELETAL:  No acute or destructive osseous processes. Scoliosis and mild  degenerative changes in the lumbar spine   MISC:  N/A.    IMPRESSION:  1. Diverticulosis minimal possible early diverticulitis adjacent to the left pelvic sidewall and adjacent to the urinary bladder. No free air abscess with large phlegmon.  2.  No coronary aortic and iliac atherosclerosis with abdominal aortic aneurysm unchanged.  3.  Possible 6 hepatic cirrhosis.  4.  Chronic COPD and interstitial lung disease.  5.  Other findings are stable as well.         Assessment & Plan:  80 year old male with a history of GERD with Schatzki's ring status post dilation in 2020, multiple adenomatous colon polyps, colonic diverticulosis, IPF on oxygen, diabetes, gout who is seen for follow-up at the request of Dr. Diona Browner to discuss abnormal liver imaging.   1.  Loose stools/recent diverticulitis --he has more chronic loose stools which has been an ongoing issue for him though this does not seem to meet the definition of diarrhea.  His imaging study suggested possible diverticulitis  for which she has completed Augmentin therapy.  He is up-to-date with colonoscopy.  It is possible his looser stools are medication induced.  I recommended the following: -- GI pathogen panel -- Begin cholestyramine 4 g once daily; he is instructed to separate this medication by 2 hours on either side of all other medications -- Loperamide and Pepto-Bismol can be used only if needed for loose stools depending on response to cholestyramine  2.  History of multiple adenomatous colon polyps --we had planned surveillance colonoscopy on a 3-year interval which for him would be March 2023.  We can discuss this further next year based on his overall health and status of his ILD at that time -- Repeat colonoscopy to be discussed in a year depending on overall health at that time  3.  GERD/nausea --heartburn symptom uncontrolled at present on low-dose omeprazole.  Zofran is effective for nausea. --Increase omeprazole to 20 mg  twice daily AC --Continue ondansetron 4 mg 3 times daily as needed  4.  Abnormal liver imaging --CT scan suggest some nodularity in the hepatic contour as well as enlargement of the caudate lobe.  This raises the question of cirrhosis but there is no evidence clinically of advanced liver disease or portal hypertension.  His platelet count is normal.  His INR has been normal.  Liver enzymes have also been normal.  I have recommended we check the following: --CBC, CMP, INR, viral hepatitis serologies --Consider ultrasound with elastography at next office visit --I do not recommend variceal screening upper endoscopy at this time though we will discuss this more at follow-up  3 month followup  30 minutes total spent today including patient facing time, coordination of care, reviewing medical history/procedures/pertinent radiology studies, and documentation of the encounter.

## 2021-04-20 DIAGNOSIS — R339 Retention of urine, unspecified: Secondary | ICD-10-CM | POA: Diagnosis not present

## 2021-04-20 LAB — HEPATITIS B SURFACE ANTIGEN: Hepatitis B Surface Ag: NONREACTIVE

## 2021-04-20 LAB — HEPATITIS B SURFACE ANTIBODY,QUALITATIVE: Hep B S Ab: NONREACTIVE

## 2021-04-20 LAB — HEPATITIS C ANTIBODY
Hepatitis C Ab: NONREACTIVE
SIGNAL TO CUT-OFF: 0.07 (ref <1.00)

## 2021-04-20 LAB — HEPATITIS B CORE ANTIBODY, TOTAL: Hep B Core Total Ab: NONREACTIVE

## 2021-04-27 ENCOUNTER — Encounter: Payer: Self-pay | Admitting: Family Medicine

## 2021-04-27 ENCOUNTER — Ambulatory Visit (INDEPENDENT_AMBULATORY_CARE_PROVIDER_SITE_OTHER): Payer: Medicare HMO | Admitting: Family Medicine

## 2021-04-27 ENCOUNTER — Other Ambulatory Visit: Payer: Self-pay

## 2021-04-27 VITALS — HR 93 | Temp 97.5°F | Ht 65.5 in | Wt 138.2 lb

## 2021-04-27 DIAGNOSIS — I152 Hypertension secondary to endocrine disorders: Secondary | ICD-10-CM

## 2021-04-27 DIAGNOSIS — Z0000 Encounter for general adult medical examination without abnormal findings: Secondary | ICD-10-CM | POA: Diagnosis not present

## 2021-04-27 DIAGNOSIS — Z794 Long term (current) use of insulin: Secondary | ICD-10-CM

## 2021-04-27 DIAGNOSIS — E1140 Type 2 diabetes mellitus with diabetic neuropathy, unspecified: Secondary | ICD-10-CM | POA: Diagnosis not present

## 2021-04-27 DIAGNOSIS — E1142 Type 2 diabetes mellitus with diabetic polyneuropathy: Secondary | ICD-10-CM | POA: Diagnosis not present

## 2021-04-27 DIAGNOSIS — E785 Hyperlipidemia, unspecified: Secondary | ICD-10-CM

## 2021-04-27 DIAGNOSIS — K529 Noninfective gastroenteritis and colitis, unspecified: Secondary | ICD-10-CM

## 2021-04-27 DIAGNOSIS — E1169 Type 2 diabetes mellitus with other specified complication: Secondary | ICD-10-CM | POA: Diagnosis not present

## 2021-04-27 DIAGNOSIS — E1159 Type 2 diabetes mellitus with other circulatory complications: Secondary | ICD-10-CM | POA: Diagnosis not present

## 2021-04-27 DIAGNOSIS — J849 Interstitial pulmonary disease, unspecified: Secondary | ICD-10-CM

## 2021-04-27 NOTE — Progress Notes (Signed)
Patient ID: Brian Bass, male    DOB: 1941/02/19, 80 y.o.   MRN: [id number]  This visit was conducted in person.  Pulse 93   Temp (!) 97.5 F (36.4 C) (Temporal)   Ht 5' 5.5" (1.664 m)   Wt 138 lb 4 oz (62.7 kg)   SpO2 96%   BMI 22.66 kg/m    CC:  Chief Complaint  Patient presents with   Medicare Wellness    Subjective:   HPI: Brian Bass is a 80 y.o. male presenting on 04/27/2021 for Medicare Wellness  The patient presents for annual medicare wellness, complete physical and review of chronic health problems. He/She also has the following acute concerns today: he has noted jerking of legs, stinging on sole of feet. Harder to sleep.  Has neuropathy.. tried gabapentin in past  I have personally reviewed the Medicare Annual Wellness questionnaire and have noted 1. The patient's medical and social history 2. Their use of alcohol, tobacco or illicit drugs 3. Their current medications and supplements 4. The patient's functional ability including ADL's, fall risks, home safety risks and hearing or visual             impairment. 5. Diet and physical activities 6. Evidence for depression or mood disorders 7.         Updated provider list Cognitive evaluation was performed and recorded on pt medicare questionnaire form. The patients weight, height, BMI and visual acuity have been recorded in the chart   I have made referrals, counseling and provided education to the patient based review of the above and I have provided the pt with a written personalized care plan for preventive services.   Documentation of this information was scanned into the electronic record under the media tab.   Advance directives and end of life planning reviewed in detail with patient and documented in EMR. Patient given handout on advance care directives if needed. HCPOA and living will updated if needed.  Fall Risk  04/27/2021 01/31/2020 01/23/2019 01/12/2018 07/14/2016  Falls in the past year? 1  0 1 Yes No  Comment - - fall due to bieng hypoglycemic; injury to right side of ribs; ER visit feet were tangled in cloth causing fall; fractured ribs -  Number falls in past yr: 0 0 0 1 -  Injury with Fall? 0 0 1 Yes -  Risk for fall due to : - Medication side effect - - -  Follow up - Falls evaluation completed;Falls prevention discussed - - -     Hearing Screening   Method: Audiometry   _0  _1  _2  _3  _4  _5  _6  _7  _8   Right ear:   _9 Left ear:   _10 Vision Screening Comments: [facility] Exam [facility] 11/12/2020   [place] Office Visit from 04/27/2021 in [place] at [facility] Total Score 0      Followed by pulm for chronic lung disease ( ILD).. On Esbriet... now decreased to 1 tab daily.  Progressively worsening over time.  Diarrhea: on cholestyramine has helped tremendously... solid BM today.  GI unconcerned for cirrhosis at this point... felt simple nodularity of liver on CT.  Plans EGD and colonoscopy next year... plans dilation of stricture. BPH  Has upcoming transurethral resection by Dr. Rebekah Chesterfield [id number]  Has foley in place.   Elevated Cholesterol: LDL almost at goal on zetia, statin Recent Labs  Lab Results  Component Value Date    CHOL 143 01/31/2020    HDL 29.70 (L) 01/31/2020    LDLCALC 77 01/31/2020    LDLDIRECT 69.0 05/08/2018    TRIG 184.0 (H) 01/31/2020    CHOLHDL 5 01/31/2020    Using medications without problems: Muscle aches:  Diet compliance:  poor Exercise: daily walking Other complaints: CAD   Hypertension:   Good control on  no medication. Using medication without problems or lightheadedness:  Syncopal episode with coughing attack. Chest pain with exertion:none Edema:none Short of breath:yes Average home BPs: Other issues:   Diabetes:   Diet has worsened lately. Not as active so sits and eats. He continues to adjust his insulin and oral meds up and down on  his own... takes insulin 2-3 days week Recent Labs       Lab Results  Component Value Date    HGBA1C 8.1 (H) 01/31/2020     Using medications without difficulties: Hypoglycemic episodes: occ in AMs Hyperglycemic episodes:  none Feet problems: no ulcer Blood Sugars averaging: FBS 169 eye exam within last year: yes 11/2019 CKD       Relevant past medical, surgical, family and social history reviewed and updated as indicated. Interim medical history since our last visit reviewed. Allergies and medications reviewed and updated. Outpatient Medications Prior to Visit  Medication Sig Dispense Refill   ACCU-CHEK FASTCLIX LANCETS MISC 1 Device by Other route 3 (three) times daily. Check blood sugars 2 to 3 times daily.  Dx: 250.02 102 each 5   ACCU-CHEK SMARTVIEW test strip TEST BLOOD SUGAR 2 TO 3 TIMES DAILY 300 strip 3   benzonatate (TESSALON) 100 MG capsule TAKE 2 CAPSULES BY MOUTH 3 TIMES DAILY 180 capsule 3   Blood Glucose Monitoring Suppl (ACCU-CHEK NANO SMARTVIEW) W/DEVICE KIT 1 kit by Other route 3 (three) times daily. Use to check blood sugars 2 to 3 times daily.  Dx: 250.02 1 kit 0   Budeson-Glycopyrrol-Formoterol (BREZTRI AEROSPHERE) 160-9-4.8 MCG/ACT AERO Inhale 2 puffs into the lungs in the morning and at bedtime. 10.7 g 11   cholestyramine (QUESTRAN) 4 g packet Take 1 packet (4 g total) by mouth daily. 30 each 2   cholestyramine light (PREVALITE) 4 g packet Take 1 packet (4 g total) by mouth 2 (two) times daily as needed (Diarrhea). 60 each 3   ezetimibe (ZETIA) 10 MG tablet TAKE 1 TABLET BY MOUTH DAILY 90 tablet 3   fenofibrate 160 MG tablet Take 1 tablet (160 mg total) by mouth daily. 90 tablet 1   gabapentin (NEURONTIN) 100 MG capsule Take 1 capsule (100 mg total) by mouth at bedtime. (Patient taking differently: Take 300 mg by mouth at bedtime.) 30 capsule 2   hydrOXYzine (VISTARIL) 25 MG capsule Take 1 capsule (25 mg total) by mouth 3 (three) times daily as needed for  anxiety or nausea. 30 capsule 0   Insulin Syringe-Needle U-100 (DROPLET INSULIN SYRINGE) 31G X 5/16" 0.3 ML MISC USE TO INJECT LANTUS INSULIN 2 TIMES DAILY 200 each 3   LANTUS 100 UNIT/ML injection INJECT 55 UNITS SQ TWICE A DAY 100 mL 0   meloxicam (MOBIC) 7.5 MG tablet Take 1 tablet (7.5 mg total) by mouth daily. 30 tablet 0   MITIGARE 0.6 MG CAPS FOR FLARE TAKE 2 TABLETS ONCE THEN REPEAT 1 TABLET IN 2 HOURS IF PAINCONTINUES, THEN 1 TABLET DAILY 60 capsule 0   Multiple Vitamin (MULTIVITAMIN) tablet Take 1 tablet by mouth daily.  Nintedanib (OFEV) 150 MG CAPS Take 150 mg by mouth 2 (two) times daily.     omeprazole (PRILOSEC) 20 MG capsule Take 1 capsule (20 mg total) by mouth 2 (two) times daily before a meal. 180 capsule 1   ondansetron (ZOFRAN) 4 MG tablet Take 1 tablet (4 mg total) by mouth 3 (three) times daily as needed for nausea or vomiting. TAKE ONE TABLET BY MOUTH EVERY DAY AS NEEDED FOR NAUSEA 60 tablet 2   simvastatin (ZOCOR) 40 MG tablet Take 1 tablet (40 mg total) by mouth at bedtime. 90 tablet 2   tamsulosin (FLOMAX) 0.4 MG CAPS capsule TAKE 1 CAPSULE EVERY DAY IF NOT IMPROVING INCREASE TO TAKE 2 CAPSULES EVERY DAY 60 capsule 5   No facility-administered medications prior to visit.     Per HPI unless specifically indicated in ROS section below Review of Systems Objective:  Pulse 93   Temp (!) 97.5 F (36.4 C) (Temporal)   Ht 5' 5.5" (1.664 m)   Wt 138 lb 4 oz (62.7 kg)   SpO2 96%   BMI 22.66 kg/m   Wt Readings from Last 3 Encounters:  04/27/21 138 lb 4 oz (62.7 kg)  04/19/21 139 lb (63 kg)  04/15/21 140 lb (63.5 kg)      Physical Exam    Results for orders placed or performed in visit on 04/19/21  Hepatitis C Antibody  Result Value Ref Range   Hepatitis C Ab NON-REACTIVE NON-REACTIVE   SIGNAL TO CUT-OFF 0.07 <1.00  Hepatitis B core antibody, total  Result Value Ref Range   Hep B Core Total Ab NON-REACTIVE NON-REACTIVE  Hepatitis B surface  antibody,qualitative  Result Value Ref Range   Hep B S Ab NON-REACTIVE NON-REACTIVE  Hepatitis B surface antigen  Result Value Ref Range   Hepatitis B Surface Ag NON-REACTIVE NON-REACTIVE  INR/PT  Result Value Ref Range   INR 1.1 (H) 0.8 - 1.0 ratio   Prothrombin Time 11.9 9.6 - 13.1 sec  Comp Met (CMET)  Result Value Ref Range   Sodium 131 (L) 135 - 145 mEq/L   Potassium 4.6 3.5 - 5.1 mEq/L   Chloride 97 96 - 112 mEq/L   CO2 26 19 - 32 mEq/L   Glucose, Bld 136 (H) 70 - 99 mg/dL   BUN 13 6 - 23 mg/dL   Creatinine, Ser 1.01 0.40 - 1.50 mg/dL   Total Bilirubin 0.8 0.2 - 1.2 mg/dL   Alkaline Phosphatase 92 39 - 117 U/L   AST 20 0 - 37 U/L   ALT 22 0 - 53 U/L   Total Protein 7.6 6.0 - 8.3 g/dL   Albumin 4.1 3.5 - 5.2 g/dL   GFR 70.09 >60.00 mL/min   Calcium 9.5 8.4 - 10.5 mg/dL  CBC with Differential/Platelet  Result Value Ref Range   WBC 9.7 4.0 - 10.5 K/uL   RBC 4.29 4.22 - 5.81 Mil/uL   Hemoglobin 13.1 13.0 - 17.0 g/dL   HCT 38.2 (L) 39.0 - 52.0 %   MCV 89.2 78.0 - 100.0 fl   MCHC 34.4 30.0 - 36.0 g/dL   RDW 14.2 11.5 - 15.5 %   Platelets 209.0 150.0 - 400.0 K/uL   Neutrophils Relative % 68.7 43.0 - 77.0 %   Lymphocytes Relative 21.9 12.0 - 46.0 %   Monocytes Relative 8.3 3.0 - 12.0 %   Eosinophils Relative 1.0 0.0 - 5.0 %   Basophils Relative 0.1 0.0 - 3.0 %   Neutro Abs 6.7  1.4 - 7.7 K/uL   Lymphs Abs 2.1 0.7 - 4.0 K/uL   Monocytes Absolute 0.8 0.1 - 1.0 K/uL   Eosinophils Absolute 0.1 0.0 - 0.7 K/uL   Basophils Absolute 0.0 0.0 - 0.1 K/uL    This visit occurred during the SARS-CoV-2 public health emergency.  Safety protocols were in place, including screening questions prior to the visit, additional usage of staff PPE, and extensive cleaning of exam room while observing appropriate contact time as indicated for disinfecting solutions.   COVID 19 screen:  No recent travel or known exposure to COVID19 The patient denies respiratory symptoms of COVID 19 at this  time. The importance of social distancing was discussed today.   Assessment and Plan   The patient's preventative maintenance and recommended screening tests for an annual wellness exam were reviewed in full today. Brought up to date unless services declined.  Counselled on the importance of diet, exercise, and its role in overall health and mortality. The patient's FH and SH was reviewed, including their home life, tobacco status, and drug and alcohol status.   Vaccines: Uptodate  Colonoscopy: 2020 [place] .. plan repeat in 2023  PSA not indicated.  Problem List Items Addressed This Visit     Diabetic polyneuropathy associated with type 2 diabetes mellitus ([place]) (Chronic)    Due to DM.  Unsure current dose of gabapentin.. will call with report. May need to increase dose of gabapentin.  encouraged better control of DM.       Hyperlipidemia associated with type 2 diabetes mellitus ([facility]) (Chronic)    LDL almost at goal on zetia, statin       Hypertension associated with diabetes ([place]) (Chronic)    Stable, chronic.  Good control on no medication given weight loss. BP Readings from Last 3 Encounters:  04/19/21 118/70  04/15/21 128/70  04/02/21 (!) 154/87           Type 2 diabetes mellitus with diabetic neuropathy, with long-term current use of insulin ([facility]) (Chronic)    Recently worsening control but also limited diet, weight loss with SE to lung medications.  continue current regimen and re-eval in 3 months.  Lantus 55 Units twice daily       Chronic diarrhea    Resolved and firm stool on cholestyramine.       ILD (interstitial lung disease) ([place])    Followed by pulm for chronic lung disease ( ILD).. On Esbriet... now decreased to 1 tab daily.  Progressively worsening over time.       Other Visit Diagnoses     Medicare annual wellness visit, subsequent    -  Primary       [name], MD

## 2021-04-27 NOTE — Patient Instructions (Addendum)
Check to see if taking gabapentin and how much...  Call and let [name] know.. we can always change to another med for foot burning and restlessness.  Keep working on health low carb diet.

## 2021-04-30 ENCOUNTER — Encounter: Payer: Self-pay | Admitting: Pulmonary Disease

## 2021-05-13 DIAGNOSIS — R1032 Left lower quadrant pain: Secondary | ICD-10-CM | POA: Diagnosis not present

## 2021-05-13 DIAGNOSIS — K5732 Diverticulitis of large intestine without perforation or abscess without bleeding: Secondary | ICD-10-CM | POA: Diagnosis not present

## 2021-05-13 DIAGNOSIS — Z794 Long term (current) use of insulin: Secondary | ICD-10-CM | POA: Diagnosis not present

## 2021-05-13 DIAGNOSIS — K219 Gastro-esophageal reflux disease without esophagitis: Secondary | ICD-10-CM | POA: Diagnosis not present

## 2021-05-13 DIAGNOSIS — Z79899 Other long term (current) drug therapy: Secondary | ICD-10-CM | POA: Diagnosis not present

## 2021-05-13 DIAGNOSIS — K5730 Diverticulosis of large intestine without perforation or abscess without bleeding: Secondary | ICD-10-CM | POA: Diagnosis not present

## 2021-05-13 DIAGNOSIS — K5792 Diverticulitis of intestine, part unspecified, without perforation or abscess without bleeding: Secondary | ICD-10-CM | POA: Diagnosis not present

## 2021-05-13 DIAGNOSIS — E119 Type 2 diabetes mellitus without complications: Secondary | ICD-10-CM | POA: Diagnosis not present

## 2021-05-13 DIAGNOSIS — K449 Diaphragmatic hernia without obstruction or gangrene: Secondary | ICD-10-CM | POA: Diagnosis not present

## 2021-05-13 DIAGNOSIS — N3289 Other specified disorders of bladder: Secondary | ICD-10-CM | POA: Diagnosis not present

## 2021-05-19 DIAGNOSIS — J849 Interstitial pulmonary disease, unspecified: Secondary | ICD-10-CM | POA: Diagnosis not present

## 2021-05-24 DIAGNOSIS — R634 Abnormal weight loss: Secondary | ICD-10-CM | POA: Diagnosis not present

## 2021-05-24 DIAGNOSIS — E1122 Type 2 diabetes mellitus with diabetic chronic kidney disease: Secondary | ICD-10-CM | POA: Diagnosis not present

## 2021-05-24 DIAGNOSIS — I2510 Atherosclerotic heart disease of native coronary artery without angina pectoris: Secondary | ICD-10-CM | POA: Diagnosis not present

## 2021-05-24 DIAGNOSIS — Z951 Presence of aortocoronary bypass graft: Secondary | ICD-10-CM | POA: Diagnosis not present

## 2021-05-24 DIAGNOSIS — R0609 Other forms of dyspnea: Secondary | ICD-10-CM | POA: Diagnosis not present

## 2021-05-24 DIAGNOSIS — I272 Pulmonary hypertension, unspecified: Secondary | ICD-10-CM | POA: Diagnosis not present

## 2021-05-24 DIAGNOSIS — J84112 Idiopathic pulmonary fibrosis: Secondary | ICD-10-CM | POA: Diagnosis not present

## 2021-05-24 DIAGNOSIS — N138 Other obstructive and reflux uropathy: Secondary | ICD-10-CM | POA: Diagnosis not present

## 2021-05-24 DIAGNOSIS — R339 Retention of urine, unspecified: Secondary | ICD-10-CM | POA: Diagnosis not present

## 2021-05-26 ENCOUNTER — Other Ambulatory Visit: Payer: Self-pay | Admitting: Family Medicine

## 2021-05-26 ENCOUNTER — Other Ambulatory Visit: Payer: Medicare HMO

## 2021-05-26 DIAGNOSIS — R932 Abnormal findings on diagnostic imaging of liver and biliary tract: Secondary | ICD-10-CM | POA: Diagnosis not present

## 2021-05-26 DIAGNOSIS — Z8719 Personal history of other diseases of the digestive system: Secondary | ICD-10-CM

## 2021-05-26 DIAGNOSIS — K219 Gastro-esophageal reflux disease without esophagitis: Secondary | ICD-10-CM

## 2021-05-26 DIAGNOSIS — R195 Other fecal abnormalities: Secondary | ICD-10-CM | POA: Diagnosis not present

## 2021-05-28 ENCOUNTER — Encounter: Payer: Self-pay | Admitting: *Deleted

## 2021-05-28 ENCOUNTER — Other Ambulatory Visit: Payer: Self-pay | Admitting: Gastroenterology

## 2021-05-28 LAB — GI PROFILE, STOOL, PCR

## 2021-05-28 MED ORDER — VANCOMYCIN HCL 125 MG PO CAPS: 125.0000 mg | ORAL_CAPSULE | Freq: Four times a day (QID) | ORAL | 0 refills | Status: AC

## 2021-05-31 DIAGNOSIS — Z4803 Encounter for change or removal of drains: Secondary | ICD-10-CM | POA: Diagnosis not present

## 2021-06-09 ENCOUNTER — Other Ambulatory Visit: Payer: Self-pay | Admitting: Pulmonary Disease

## 2021-06-14 DIAGNOSIS — K529 Noninfective gastroenteritis and colitis, unspecified: Secondary | ICD-10-CM | POA: Insufficient documentation

## 2021-06-14 NOTE — Assessment & Plan Note (Signed)
Resolved and firm stool on cholestyramine.

## 2021-06-14 NOTE — Assessment & Plan Note (Signed)
Stable, chronic.  Good control on no medication given weight loss. BP Readings from Last 3 Encounters:  04/19/21 118/70  04/15/21 128/70  04/02/21 (!) 154/87

## 2021-06-14 NOTE — Assessment & Plan Note (Signed)
LDL almost at goal on zetia, statin

## 2021-06-14 NOTE — Assessment & Plan Note (Signed)
Followed by pulm for chronic lung disease ( ILD).. On Esbriet... now decreased to 1 tab daily.  Progressively worsening over time.

## 2021-06-14 NOTE — Assessment & Plan Note (Signed)
Recently worsening control but also limited diet, weight loss with SE to lung medications.  continue current regimen and re-eval in 3 months.  Lantus 55 Units twice daily

## 2021-06-14 NOTE — Assessment & Plan Note (Signed)
Due to DM.  Unsure current dose of gabapentin.. will call with report. May need to increase dose of gabapentin.  encouraged better control of DM.

## 2021-06-16 ENCOUNTER — Encounter: Payer: Self-pay | Admitting: *Deleted

## 2021-06-17 DIAGNOSIS — Z951 Presence of aortocoronary bypass graft: Secondary | ICD-10-CM | POA: Diagnosis not present

## 2021-06-17 DIAGNOSIS — N401 Enlarged prostate with lower urinary tract symptoms: Secondary | ICD-10-CM | POA: Diagnosis not present

## 2021-06-17 DIAGNOSIS — J84112 Idiopathic pulmonary fibrosis: Secondary | ICD-10-CM | POA: Diagnosis not present

## 2021-06-17 DIAGNOSIS — I2510 Atherosclerotic heart disease of native coronary artery without angina pectoris: Secondary | ICD-10-CM | POA: Diagnosis not present

## 2021-06-17 DIAGNOSIS — R634 Abnormal weight loss: Secondary | ICD-10-CM | POA: Diagnosis not present

## 2021-06-17 DIAGNOSIS — R338 Other retention of urine: Secondary | ICD-10-CM | POA: Diagnosis not present

## 2021-06-17 DIAGNOSIS — R330 Drug induced retention of urine: Secondary | ICD-10-CM | POA: Diagnosis not present

## 2021-06-17 DIAGNOSIS — E1122 Type 2 diabetes mellitus with diabetic chronic kidney disease: Secondary | ICD-10-CM | POA: Diagnosis not present

## 2021-06-17 DIAGNOSIS — R339 Retention of urine, unspecified: Secondary | ICD-10-CM | POA: Diagnosis not present

## 2021-06-17 DIAGNOSIS — R0609 Other forms of dyspnea: Secondary | ICD-10-CM | POA: Diagnosis not present

## 2021-06-17 DIAGNOSIS — N138 Other obstructive and reflux uropathy: Secondary | ICD-10-CM | POA: Diagnosis not present

## 2021-06-17 DIAGNOSIS — I272 Pulmonary hypertension, unspecified: Secondary | ICD-10-CM | POA: Diagnosis not present

## 2021-06-18 DIAGNOSIS — N138 Other obstructive and reflux uropathy: Secondary | ICD-10-CM | POA: Diagnosis not present

## 2021-06-18 DIAGNOSIS — R634 Abnormal weight loss: Secondary | ICD-10-CM | POA: Diagnosis not present

## 2021-06-18 DIAGNOSIS — E1122 Type 2 diabetes mellitus with diabetic chronic kidney disease: Secondary | ICD-10-CM | POA: Diagnosis not present

## 2021-06-18 DIAGNOSIS — R0609 Other forms of dyspnea: Secondary | ICD-10-CM | POA: Diagnosis not present

## 2021-06-18 DIAGNOSIS — I2510 Atherosclerotic heart disease of native coronary artery without angina pectoris: Secondary | ICD-10-CM | POA: Diagnosis not present

## 2021-06-18 DIAGNOSIS — R339 Retention of urine, unspecified: Secondary | ICD-10-CM | POA: Diagnosis not present

## 2021-06-18 DIAGNOSIS — J84112 Idiopathic pulmonary fibrosis: Secondary | ICD-10-CM | POA: Diagnosis not present

## 2021-06-18 DIAGNOSIS — I272 Pulmonary hypertension, unspecified: Secondary | ICD-10-CM | POA: Diagnosis not present

## 2021-06-18 DIAGNOSIS — Z951 Presence of aortocoronary bypass graft: Secondary | ICD-10-CM | POA: Diagnosis not present

## 2021-06-18 DIAGNOSIS — J849 Interstitial pulmonary disease, unspecified: Secondary | ICD-10-CM | POA: Diagnosis not present

## 2021-06-21 ENCOUNTER — Telehealth: Payer: Self-pay | Admitting: Internal Medicine

## 2021-06-21 NOTE — Telephone Encounter (Signed)
Pts wife called and states her husband was treated for cdiff a while back and he did improve with the treatment. He is also taking a probiotic bid. States he had a loose stool last night but not diarrhea. Discussed with her to watch for now but if the loose stool continues to call back and he may need to be tested to see if the cdiff has recurred. Wife verbalized understanding.

## 2021-06-21 NOTE — Telephone Encounter (Signed)
Patients wife called requesting to speak with a nurse about the patient still having diarrhea.

## 2021-06-25 ENCOUNTER — Telehealth: Payer: Self-pay

## 2021-06-25 MED ORDER — GABAPENTIN 300 MG PO CAPS: 300.0000 mg | ORAL_CAPSULE | Freq: Every day | ORAL | 0 refills | Status: DC

## 2021-06-25 NOTE — Telephone Encounter (Signed)
Received call from patient's spouse, [name] [facility]). [name] is requestinf refill on Gabapentin 100 TID.  Dr. Patsey Berthold, please advise. Thanks

## 2021-06-25 NOTE — Telephone Encounter (Signed)
As of his last visit on medication review he was on 300 mg at bedtime for cough related to IPF.  If he is taking 100 mg 3 times daily is basically the same dose throughout the day but need to know what works better for his cough.  He keeps changing the way he doses it.  If he is taking 100 3 times daily then that can be refilled.  If he is taking 300 mg at bedtime then we can simplify the prescription to a 300 mg capsule instead of 3 capsules at bedtime.

## 2021-06-25 NOTE — Telephone Encounter (Signed)
Spoke to patient's spouse, [name](DPR) who stated that patient is taking 3 167m of Gabapentin at night.  Rx for Gabapentin 301mhas been sent to preferred pharmacy. Nothing further needed.

## 2021-06-30 ENCOUNTER — Other Ambulatory Visit: Payer: Medicare HMO

## 2021-07-19 DIAGNOSIS — J849 Interstitial pulmonary disease, unspecified: Secondary | ICD-10-CM | POA: Diagnosis not present

## 2021-07-20 ENCOUNTER — Ambulatory Visit: Payer: Medicare HMO | Admitting: Internal Medicine

## 2021-07-22 ENCOUNTER — Other Ambulatory Visit: Payer: Self-pay | Admitting: Pulmonary Disease

## 2021-07-22 NOTE — Telephone Encounter (Signed)
Pt is requesting gabapentin, would you like to refill this?

## 2021-07-26 ENCOUNTER — Other Ambulatory Visit: Payer: Self-pay | Admitting: Internal Medicine

## 2021-07-26 DIAGNOSIS — R338 Other retention of urine: Secondary | ICD-10-CM | POA: Diagnosis not present

## 2021-07-26 DIAGNOSIS — R82998 Other abnormal findings in urine: Secondary | ICD-10-CM | POA: Diagnosis not present

## 2021-07-26 DIAGNOSIS — R339 Retention of urine, unspecified: Secondary | ICD-10-CM | POA: Diagnosis not present

## 2021-07-26 DIAGNOSIS — N390 Urinary tract infection, site not specified: Secondary | ICD-10-CM | POA: Diagnosis not present

## 2021-07-27 DIAGNOSIS — R339 Retention of urine, unspecified: Secondary | ICD-10-CM | POA: Diagnosis not present

## 2021-08-09 IMAGING — CT CT CHEST HIGH RESOLUTION W/O CM
2 of 7 series · 13 of 36 positions shown, 16 images · non-contrast
Comparison: Chest CT 02/05/2020.

CLINICAL DATA: 79-year-old male with history of pulmonary fibrosis.
Follow-up study.

EXAM:
CT CHEST WITHOUT CONTRAST
TECHNIQUE: Multidetector CT imaging of the chest was performed following the
standard protocol without intravenous contrast. High resolution
imaging of the lungs, as well as inspiratory and expiratory imaging,
was performed.

[Series 4: thorax 2.00 cor · coronal · 0.52mm/px · 3 of 136 slices shown]
[im 28/136  lung]
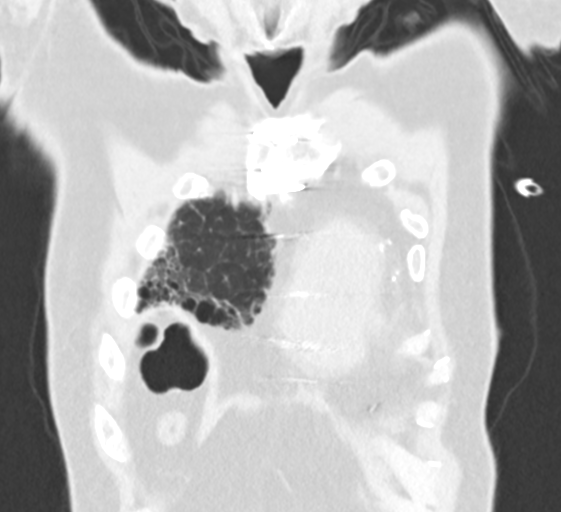
[im 55/136  lung]
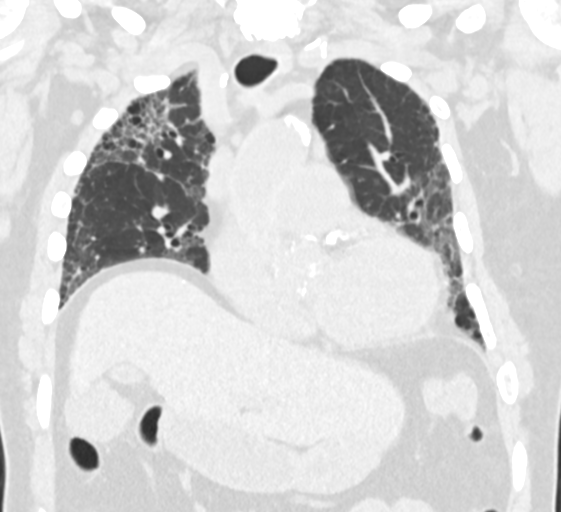
[im 82/136  lung]
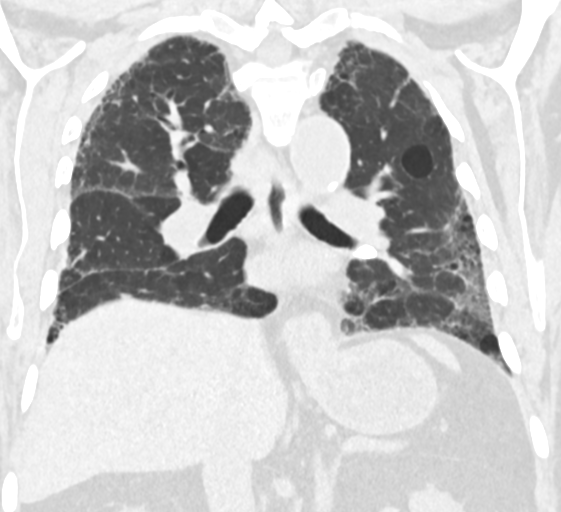

[Series 10: high res (id) thorax 1.00 ax · axial · 0.57mm/px · z∈[-1082,-858]mm · 10 of 266 slices shown, 13 images]
[im 21/266  mediastinal]
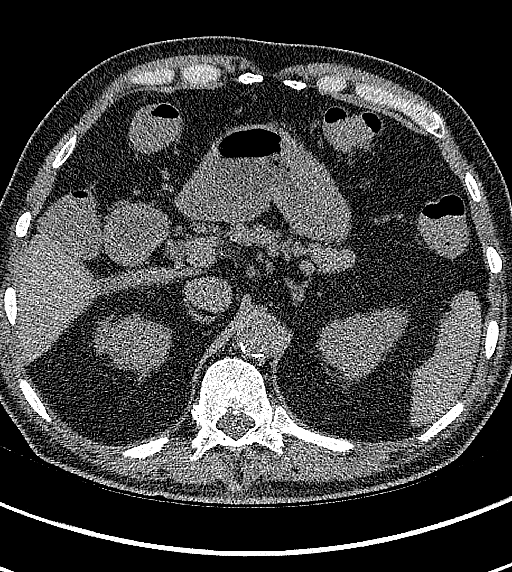
[im 21/266  lung]
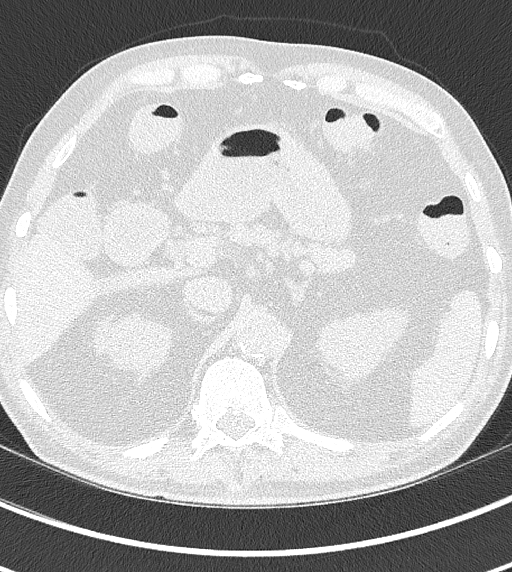
[im 41/266  lung]
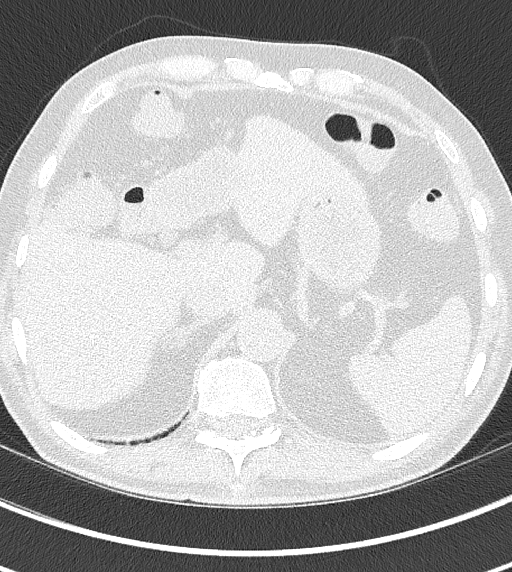
[im 82/266  lung]
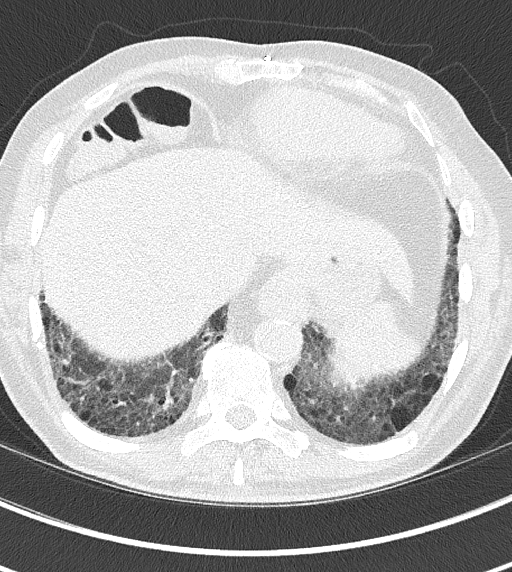
[im 102/266  lung]
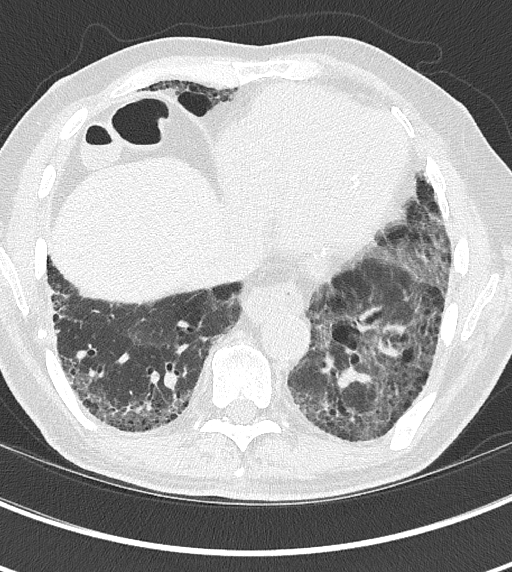
[im 123/266  mediastinal]
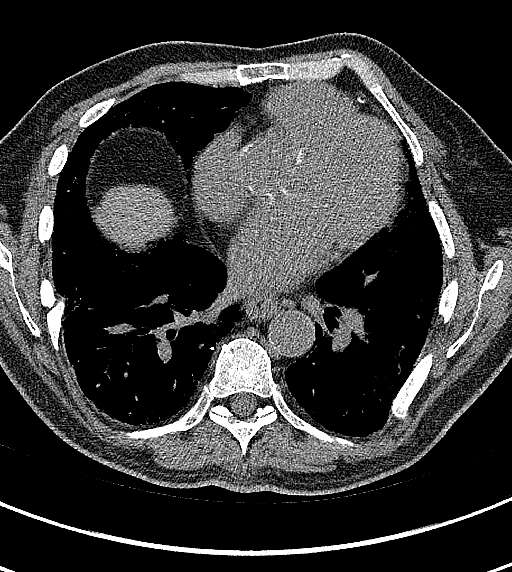
[im 123/266  lung]
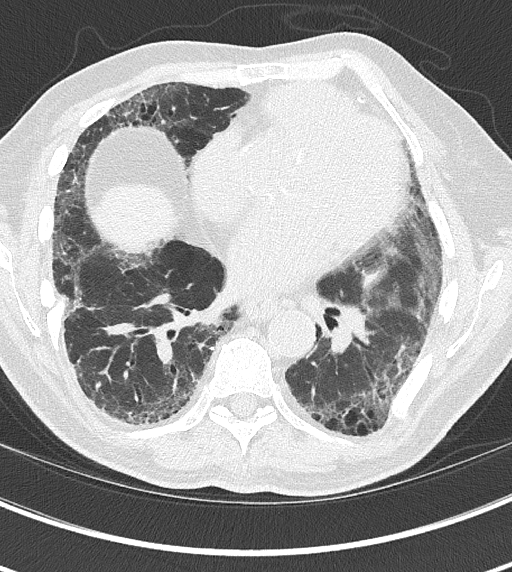
[im 143/266  lung]
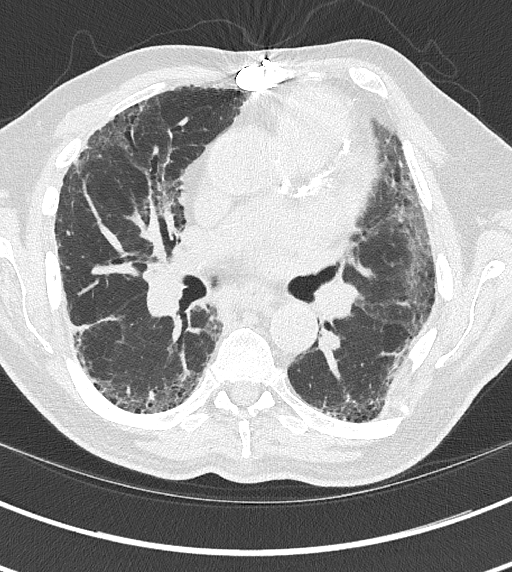
[im 164/266  lung]
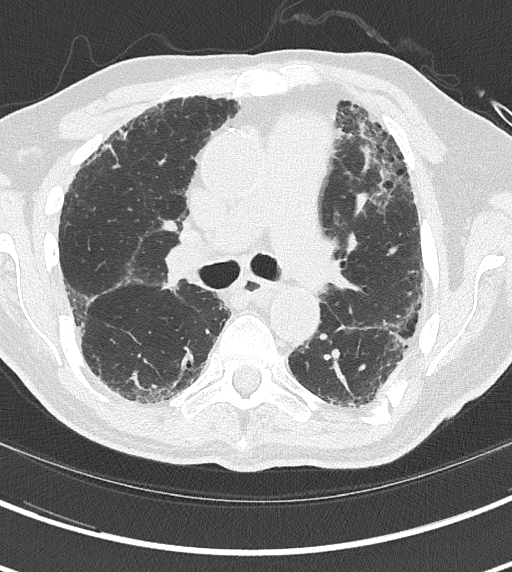
[im 204/266  lung]
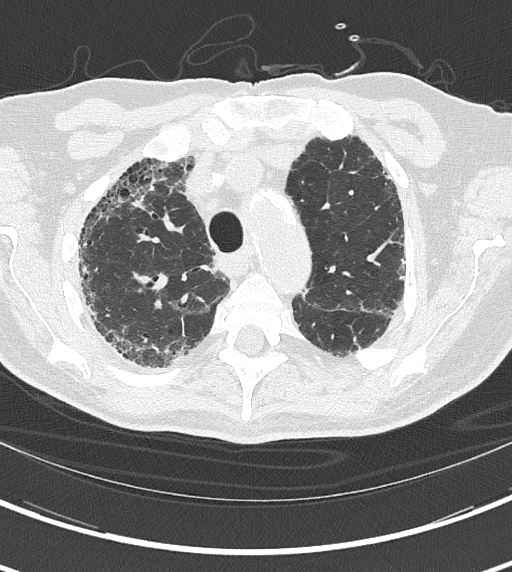
[im 225/266  mediastinal]
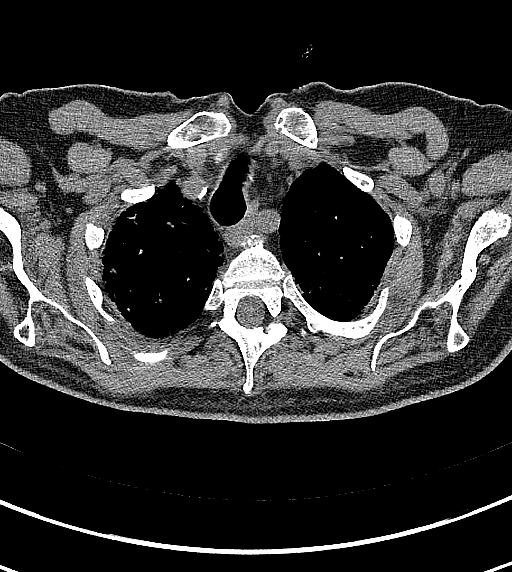
[im 225/266  lung]
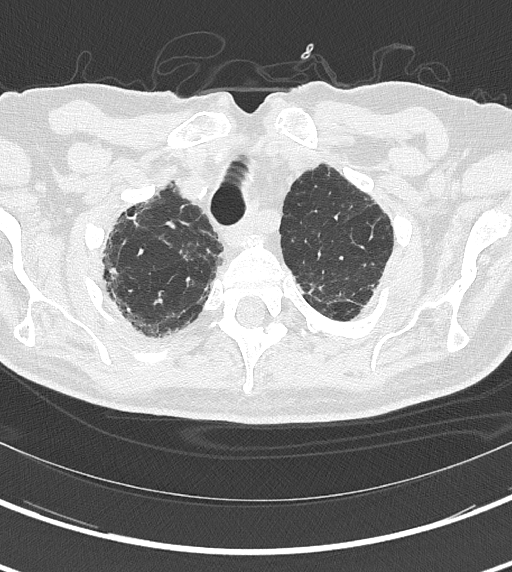
[im 245/266  lung]
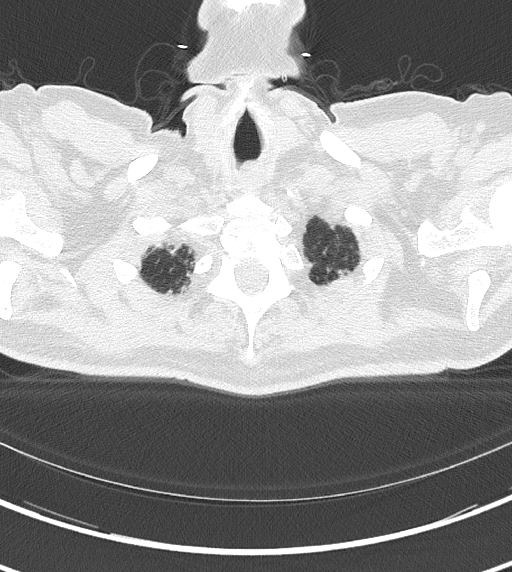

[13 of 36 positions shown; findings below may reference images not displayed]

FINDINGS: Cardiovascular: Heart size is normal. There is no significant
pericardial fluid, thickening or pericardial calcification. There is
aortic atherosclerosis, as well as atherosclerosis of the great
vessels of the mediastinum and the coronary arteries, including
calcified atherosclerotic plaque in the left main, left anterior
descending, left circumflex and right coronary arteries. Status post
median sternotomy for CABG including [REDACTED] to the LAD. Calcifications
of the aortic valve.

Mediastinum/Nodes: No pathologically enlarged mediastinal or hilar
lymph nodes. Please note that accurate exclusion of hilar adenopathy
is limited on noncontrast CT scans. Small to moderate hiatal hernia.
No axillary lymphadenopathy.

Lungs/Pleura: High-resolution images again demonstrate patchy areas
of ground-glass attenuation, septal thickening, subpleural
reticulation, parenchymal banding, traction bronchiectasis and frank
honeycombing. Findings have a craniocaudal gradient and are mildly
progressed compared to the prior examination. Inspiratory and
expiratory imaging is unremarkable. No acute consolidative airspace
disease. No pleural effusions.

Upper Abdomen: Aortic atherosclerosis.

Musculoskeletal: Median sternotomy wires. There are no aggressive
appearing lytic or blastic lesions noted in the visualized portions
of the skeleton.
IMPRESSION: 1. The appearance of the lungs is compatible with interstitial lung
disease, once again categorized as compatible with usual
interstitial pneumonia (UIP) per current ATS guidelines. Minimal
progression compared to the prior study.
2. Aortic atherosclerosis, in addition to left main and 3 vessel
coronary artery disease. Status post median sternotomy for CABG
including [REDACTED] to the LAD.
3. There are calcifications of the aortic valve. Echocardiographic
correlation for evaluation of potential valvular dysfunction may be
warranted if clinically indicated.

Aortic Atherosclerosis ([id number]).

## 2021-08-12 ENCOUNTER — Ambulatory Visit (INDEPENDENT_AMBULATORY_CARE_PROVIDER_SITE_OTHER): Payer: Medicare HMO

## 2021-08-12 ENCOUNTER — Other Ambulatory Visit: Payer: Self-pay

## 2021-08-12 DIAGNOSIS — R0609 Other forms of dyspnea: Secondary | ICD-10-CM

## 2021-08-12 DIAGNOSIS — R0600 Dyspnea, unspecified: Secondary | ICD-10-CM | POA: Diagnosis not present

## 2021-08-15 LAB — ECHOCARDIOGRAM COMPLETE
AR max vel: 1.6 cm2
AV Area VTI: 1.53 cm2
AV Area mean vel: 1.6 cm2
AV Mean grad: 7.6 mmHg
AV Peak grad: 12.6 mmHg
Ao pk vel: 1.78 m/s
Area-P 1/2: 3.17 cm2
Calc EF: 56.9 %
MV M vel: 5.69 m/s
MV Peak grad: 129.3 mmHg
Radius: 0.3 cm
S' Lateral: 3 cm
Single Plane A2C EF: 51.2 %
Single Plane A4C EF: 63.1 %

## 2021-08-17 ENCOUNTER — Other Ambulatory Visit: Payer: Self-pay | Admitting: Internal Medicine

## 2021-08-19 DIAGNOSIS — J849 Interstitial pulmonary disease, unspecified: Secondary | ICD-10-CM | POA: Diagnosis not present

## 2021-08-24 ENCOUNTER — Other Ambulatory Visit: Payer: Self-pay

## 2021-08-24 ENCOUNTER — Ambulatory Visit: Payer: Medicare HMO | Admitting: Pulmonary Disease

## 2021-08-24 ENCOUNTER — Encounter: Payer: Self-pay | Admitting: Pulmonary Disease

## 2021-08-24 ENCOUNTER — Other Ambulatory Visit
Admission: RE | Admit: 2021-08-24 | Discharge: 2021-08-24 | Disposition: A | Discharge status: Home / Self Care | Payer: Medicare HMO | Attending: Pulmonary Disease | Admitting: Pulmonary Disease

## 2021-08-24 VITALS — BP 100/60 | HR 87 | Temp 97.1°F | Ht 66.0 in | Wt 125.2 lb

## 2021-08-24 DIAGNOSIS — D649 Anemia, unspecified: Secondary | ICD-10-CM

## 2021-08-24 DIAGNOSIS — Z5181 Encounter for therapeutic drug level monitoring: Secondary | ICD-10-CM

## 2021-08-24 DIAGNOSIS — D509 Iron deficiency anemia, unspecified: Secondary | ICD-10-CM | POA: Diagnosis not present

## 2021-08-24 DIAGNOSIS — R0600 Dyspnea, unspecified: Secondary | ICD-10-CM | POA: Diagnosis not present

## 2021-08-24 DIAGNOSIS — G2581 Restless legs syndrome: Secondary | ICD-10-CM | POA: Diagnosis not present

## 2021-08-24 DIAGNOSIS — J84112 Idiopathic pulmonary fibrosis: Secondary | ICD-10-CM | POA: Diagnosis not present

## 2021-08-24 DIAGNOSIS — R0609 Other forms of dyspnea: Secondary | ICD-10-CM

## 2021-08-24 DIAGNOSIS — R053 Chronic cough: Secondary | ICD-10-CM | POA: Diagnosis not present

## 2021-08-24 DIAGNOSIS — I272 Pulmonary hypertension, unspecified: Secondary | ICD-10-CM | POA: Diagnosis not present

## 2021-08-24 LAB — HEPATIC FUNCTION PANEL
ALT: 18 U/L (ref 0–44)
AST: 21 U/L (ref 15–41)
Albumin: 3.8 g/dL (ref 3.5–5.0)
Alkaline Phosphatase: 94 U/L (ref 38–126)
Bilirubin, Direct: 0.1 mg/dL (ref 0.0–0.2)
Indirect Bilirubin: 0.7 mg/dL (ref 0.3–0.9)
Total Bilirubin: 0.8 mg/dL (ref 0.3–1.2)
Total Protein: 7.6 g/dL (ref 6.5–8.1)

## 2021-08-24 LAB — CBC WITH DIFFERENTIAL/PLATELET
Abs Immature Granulocytes: 0.02 10*3/uL (ref 0.00–0.07)
Basophils Absolute: 0 10*3/uL (ref 0.0–0.1)
Basophils Relative: 0 %
Eosinophils Absolute: 0.2 10*3/uL (ref 0.0–0.5)
Eosinophils Relative: 2 %
HCT: 38.1 % — ABNORMAL LOW (ref 39.0–52.0)
Hemoglobin: 12.6 g/dL — ABNORMAL LOW (ref 13.0–17.0)
Immature Granulocytes: 0 %
Lymphocytes Relative: 34 %
Lymphs Abs: 2.8 10*3/uL (ref 0.7–4.0)
MCH: 30 pg (ref 26.0–34.0)
MCHC: 33.1 g/dL (ref 30.0–36.0)
MCV: 90.7 fL (ref 80.0–100.0)
Monocytes Absolute: 0.9 10*3/uL (ref 0.1–1.0)
Monocytes Relative: 10 %
Neutro Abs: 4.5 10*3/uL (ref 1.7–7.7)
Neutrophils Relative %: 54 %
Platelets: 222 10*3/uL (ref 150–400)
RBC: 4.2 MIL/uL — ABNORMAL LOW (ref 4.22–5.81)
RDW: 13.6 % (ref 11.5–15.5)
WBC: 8.4 10*3/uL (ref 4.0–10.5)
nRBC: 0 % (ref 0.0–0.2)

## 2021-08-24 LAB — IRON AND TIBC
Iron: 47 ug/dL (ref 45–182)
Saturation Ratios: 11 % — ABNORMAL LOW (ref 17.9–39.5)
TIBC: 423 ug/dL (ref 250–450)
UIBC: 376 ug/dL

## 2021-08-24 LAB — FERRITIN: Ferritin: 262 ng/mL (ref 24–336)

## 2021-08-24 NOTE — Patient Instructions (Signed)
Take the 300 mg gabapentin at bedtime this will help with your cough and restless legs  We are checking blood count and iron levels (iron, TIBC and ferritin) sometimes low iron can cause restless legs  Use your oxygen is much as possible during the day this will help with lowering the pressure in the artery going from the heart to the lungs  Hold on the Ofev for 2 weeks to see if your appetite improves if there is no change resume the Ofev  We will see him in follow-up in 6 to 8 weeks time call sooner should any new problems arise

## 2021-08-24 NOTE — Progress Notes (Signed)
Subjective:    Patient ID: Brian Bass, male    DOB: 07-13-1941, 80 y.o.   MRN: [id number] Chief Complaint  Patient presents with   Follow-up    Pt states cough   HPI Patient is an 80 year old lifelong never smoker with IPF who follows here for the same.  This is scheduled visit.  His last visit here was on 15 Apr 2021.  At that time an echocardiogram was ordered to follow-up on his pulmonary hypertension.  His symptoms are actually at baseline with regards to dyspnea.  His main issue is that since he started Ofev he has lost significant amount of weight due to anorexia.  This even though his Ofev dose has been reduced.  He also has developed restless legs syndrome.  Cough has worsened again at nighttime.  He continues to be able to participate in activities of daily living in a limited fashion due to dyspnea.  He has not had any fevers, chills or sweats.  No nausea or vomiting.  No abdominal pain.  Most recent PFTs,and chest CT show relative stability of disease.  Most recent 2D echo performed on 8 September shows worsening pulmonary hypertension.  This may be the cause for his issues with persistent dyspnea out of proportion to his pulmonary function.  He does not voice any other complaint today.  He has been more compliant with oxygen use but still continues to not use it with exertion with he needs.   DATA: Serology 02/27/18>> ACE, rheumatoid factor, ANA negative. PFTs 02/27/18:FVC is 63% predicted, FEV1 of 70% predicted, ratio 86% predicted.  There is no significant change with bronchodilator therapy.  Flow volume loop is consistent with restriction.DLCO is 54% predicted, DLCO is reduced at 56%. CT high-resolution 02/27/18>> the bibasilar fibrotic changes have advanced significantly in the subpleural areas of traction bronchiectasis changes have worsened.  Right paratracheal lymphadenopathy minimally changed. Chest x-ray 02/08/18; diffuse groundglass and interstitial changes throughout  both lungs, right diaphragm is elevated.  Interstitial changes has progressed significantly since previous chest x-ray on 09/06/13.  CT chest 10/21/13, bilateral emphysematous changes, bilateral subpleural reticular changes consistent with pulmonary fibrosis.  Chemistry 01/23/2019>> AST, ALT normal. Overnight oximetry 10/08/2018>> performed on room air; time less than or equal to 88% was 0.7 minutes.  Oxygen was not required. ONOX 11/13/2019 no significant desaturations though cyclical desats to 34% were noted. CT chest high resolution 02/05/2020 persistent severe fibrotic changes unchanged from 3/19 however significantly progressed from 2014. 2D echo 02/05/2020 LVEF 60 to 65%, moderate pulmonary hypertension PFTs 04/13/2021: Restrictive physiology of moderate degree, FEV1 1.38 L or 58% predicted, FVC 1.82 L or 53% predicted.  FEV1/FVC 76%, no bronchodilator response.  Diffusion capacity moderately impaired. 2D echo 08/12/2021: LVEF 55 to 60%, mild LVH, grade 1 DD, moderate to severe mitral valve regurgitation, severe pulmonary hypertension.  Since prior 2D echo pulmonary hypertension has worsened  Review of Systems A 10 point review of systems was performed and it is as noted above otherwise negative.  Patient Active Problem List   Diagnosis Date Noted   Chronic diarrhea 06/14/2021   Abnormal CT of liver 04/02/2021   Urinary retention 04/02/2021   Urinary retention with incomplete bladder emptying 01/19/2021   Joint pain 09/09/2020   Atherosclerosis of native coronary artery of native heart with stable angina pectoris ([place]) 03/23/2019   Elevated PSA 01/29/2019   Diabetic polyneuropathy associated with type 2 diabetes mellitus ([place]) 01/29/2019   Positive cardiac stress test 03/06/2018   DOE (dyspnea  on exertion) 02/08/2018   Benign prostatic hyperplasia with nocturia 02/08/2018   Counseling regarding end of life decision making 07/14/2015   GERD (gastroesophageal reflux disease) 12/13/2013   ILD  (interstitial lung disease) ([place]) 11/08/2013   CKD (chronic kidney disease) stage 2, GFR 60-89 ml/min 09/06/2013   Idiopathic gout of multiple sites 12/03/2009   CAD (coronary artery disease) 11/02/2009   Type 2 diabetes mellitus with diabetic neuropathy, with long-term current use of insulin ([place]) 08/26/2009   Hyperlipidemia associated with type 2 diabetes mellitus ([place]) 08/26/2009   Hypertension associated with diabetes ([place]) 08/26/2009   OSTEOARTHRITIS, MULTIPLE JOINTS 08/26/2009   ESOPHAGEAL STRICTURE 10/14/2008   IRRITABLE BOWEL SYNDROME 09/02/2008   PERSONAL HX COLONIC POLYPS 09/02/2008   Social History   Tobacco Use   Smoking status: Never   Smokeless tobacco: Never  Substance Use Topics   Alcohol use: No   Allergies  Allergen Reactions   Ciprofloxacin Swelling     Severe joint pain and swelling   Current Meds  Medication Sig   ACCU-CHEK FASTCLIX LANCETS MISC 1 Device by Other route 3 (three) times daily. Check blood sugars 2 to 3 times daily.  Dx: 250.02   ACCU-CHEK SMARTVIEW test strip TEST BLOOD SUGAR 2 TO 3 TIMES DAILY   benzonatate (TESSALON) 100 MG capsule TAKE 2 CAPSULES BY MOUTH 3 TIMES DAILY   Blood Glucose Monitoring Suppl (ACCU-CHEK NANO SMARTVIEW) W/DEVICE KIT 1 kit by Other route 3 (three) times daily. Use to check blood sugars 2 to 3 times daily.  Dx: 250.02   Budeson-Glycopyrrol-Formoterol (BREZTRI AEROSPHERE) 160-9-4.8 MCG/ACT AERO Inhale 2 puffs into the lungs in the morning and at bedtime.   cholestyramine (QUESTRAN) 4 g packet Take 1 packet (4 g total) by mouth daily.   cholestyramine light (PREVALITE) 4 g packet Take 1 packet (4 g total) by mouth 2 (two) times daily as needed (Diarrhea).   fenofibrate 160 MG tablet Take 1 tablet (160 mg total) by mouth daily.   gabapentin (NEURONTIN) 300 MG capsule TAKE 1 CAPSULE BY MOUTH AT BEDTIME   Insulin Syringe-Needle U-100 (DROPLET INSULIN SYRINGE) 31G X 5/16" 0.3 ML MISC USE TO INJECT LANTUS INSULIN 2 TIMES DAILY    LANTUS 100 UNIT/ML injection INJECT 55 UNITS SQ TWICE A DAY   MITIGARE 0.6 MG CAPS FOR FLARE TAKE 2 CAPSULES ONCE THEN TAKE1 CAPSULE IN 2 HOURS IF PAIN CONTINUES, THEN TAKE 1 CAPSULE DAILY   Nintedanib (OFEV) 150 MG CAPS Take 150 mg by mouth 2 (two) times daily.   omeprazole (PRILOSEC) 20 MG capsule Take 1 capsule (20 mg total) by mouth 2 (two) times daily before a meal.   ondansetron (ZOFRAN) 4 MG tablet Take 1 tablet (4 mg total) by mouth 3 (three) times daily as needed for nausea or vomiting. TAKE ONE TABLET BY MOUTH EVERY DAY AS NEEDED FOR NAUSEA   tamsulosin (FLOMAX) 0.4 MG CAPS capsule TAKE 1 CAPSULE EVERY DAY IF NOT IMPROVING INCREASE TO TAKE 2 CAPSULES EVERY DAY    Immunization History  Administered Date(s) Administered   Fluad Quad(high Dose 65+) 08/08/2019, 09/30/2020   Influenza Split 10/05/2011, 10/11/2012   Influenza Whole 11/12/2010   Influenza,inj,Quad PF,6+ Mos 09/06/2013, 09/24/2014, 10/02/2015, 09/13/2016, 09/29/2017, 08/24/2018   PFIZER(Purple Top)SARS-COV-2 Vaccination 01/22/2020, 02/12/2020   Pneumococcal Conjugate-13 01/12/2018   Pneumococcal Polysaccharide-23 12/06/2007   Td 08/10/2010, 04/10/2020       Objective:   Physical Exam BP 100/60 (BP Location: Left Arm, Patient Position: Sitting, Cuff Size: Normal)   Pulse 87  Temp (!) 97.1 F (36.2 C) (Temporal)   Ht _0  (1.676 m)   Wt 125 lb 3.2 oz (56.8 kg)   SpO2 97%   BMI 20.21 kg/m  GENERAL:  Thin, chronically ill-appearing, awake and alert, no respiratory distress.  Mild conversational dyspnea.  He presents in transport chair due to dyspnea. HEAD: Normocephalic, atraumatic.  EYES: Pupils equal, round, reactive to light.  No scleral icterus.  MOUTH: Nose/mouth/throat not examined due to masking requirements for COVID 19. NECK: Supple. No thyromegaly. Trachea midline. No JVD.  No adenopathy. PULMONARY: Good air entry bilaterally.   Mild Velcro crackles at bases otherwise, no adventitious  sounds. CARDIOVASCULAR: S1 and S2. Regular rate and rhythm.  Grade 2/6 systolic ejection murmur at the left sternal border, no gallop.  No rubs. ABDOMEN:.  Benign. MUSCULOSKELETAL: No joint deformity, no clubbing, no edema.  NEUROLOGIC: No overt focal deficits.  Gait not tested.  Speech is fluent. SKIN: Intact,warm,dry.  On limited exam no rashes or lesions. PSYCH: Mood depressed and behavior appropriate.     Assessment & Plan:     ICD-10-CM   1. IPF (idiopathic pulmonary fibrosis) (HCC)  J84.112    Anorexia and weight loss with Ofev He is reluctant to stop medication Recommended stop for 2 weeks to see if any change    2. DOE (dyspnea on exertion)  R06.00    Mostly likely due to his severe PAH Continue oxygen supplementation    3. Severe pulmonary hypertension (HCC) - Group 3H  I27.20    Due to chronic hypoxic vasoconstriction and IPF Not a candidate for Tyvaso Poor tolerance of multiple medications    4. Restless legs syndrome  G25.81 CBC w/Diff    Ferritin    Iron and TIBC   Obtain iron studies The strongest risk factor associated with RLS is iron deficiency Iron deficiency can reduce levels of dopamine causing RLS    5. Anemia, unspecified type  D64.9    Mild anemia noted on last laboratory data [facility]) Obtain CBC Iron studies    6. Chronic cough  R05.3    Associated with his IPF Has been controlled to some degree with gabapentin Increase nighttime dose of gabapentin to 300 mg at at bedtime     Orders Placed This Encounter  Procedures   CBC w/Diff    Standing Status:   Future    Number of Occurrences:   1    Standing Expiration Date:   08/24/2022   Ferritin    Standing Status:   Future    Number of Occurrences:   1    Standing Expiration Date:   08/24/2022   Iron and TIBC    Standing Status:   Future    Number of Occurrences:   1    Standing Expiration Date:   08/24/2022   The patient continues to have issues with Ofev even despite reduction of dose.   He has been reluctant to stop the medication however, we agreed on discontinuing the medication for 2 weeks and see if his anorexia and weight loss improve.  This is a known side effect of the Ofev.  He has developed issues with restless legs which is a new problem for him.  He was noted to have anemia on his last blood work from [facility] in July H&H being 11.4 and 35.0 respectively which had decreased from 12.8 and 37.7 in June.  We will check CBC and iron studies to better delineate this issue.  Ofev  can also cause GI perforation and bleeding.  Patient has been encouraged to use his oxygen 24/7.  This is important given his severe pulmonary hypertension.  See him in follow-up in 6 to 8 weeks time he is to call sooner should any new problems arise.  Renold Don, MD [facility]  [facility]    *This note was dictated using voice recognition software/Dragon.  Despite best efforts to proofread, errors can occur which can change the meaning.  Any change was purely unintentional.

## 2021-08-27 ENCOUNTER — Telehealth: Payer: Self-pay | Admitting: Pulmonary Disease

## 2021-08-27 NOTE — Telephone Encounter (Signed)
Lm for patient's spouse, [name] (DPR).  

## 2021-08-30 ENCOUNTER — Telehealth: Payer: Self-pay | Admitting: Gastroenterology

## 2021-08-30 NOTE — Telephone Encounter (Signed)
Patients daughter calling to follow up on previous message.

## 2021-08-30 NOTE — Telephone Encounter (Signed)
Pt did have c diff in June 2022 Hx of diverticulosis Anorectal pain raises questions of fissure or thrombosed external hemorrhoid.  Without exam difficult to know, also could be perianal abscess  Would recommend:  APP visit this week, if not available could see if a physician has any openings (I am hospital MD this week with no available clinic time) CBC, CMP, C diff PCR stool test (send STAT)  Thanks

## 2021-08-30 NOTE — Telephone Encounter (Signed)
Please see notes below. Pt daughter states that pt originally had constipation and after receiving some sort of medication from his wife now he is experiencing diarrhea. Pt daughter states that pt is having rectal pain along with abdominal pain. Pt daughter states that pt cannot even sit down on the toilet and that he has to stand to have a BM because the pain is so bad. Pt daughter states that pt seems to be depressed and not eating and drink and he should. Please Advise

## 2021-08-30 NOTE — Telephone Encounter (Signed)
Lm x2 for Brian Bass(DPR) Will close encounter per office protocol.

## 2021-08-30 NOTE — Telephone Encounter (Signed)
Brian Bass,  I received a call in the evening of 08/29/2021 from this patient's daughter.  He is known to you, last seen in clinic May 16 with a history of diverticulitis and diarrhea.  His daughter Brian Bass had been to see him over the weekend and said he was "not doing well".  He had been experiencing escalating lower abdominal pain and initially constipation but then intractable diarrhea.  He thought that may have flared some hemorrhoids since he was having significant rectal/perianal pain such that it was difficult to get comfortable sitting.  I do not see any recent phone calls to the office regarding these symptoms, but his daughter says they have been going on for "some time".  She was hoping that her father could get some further advice and possibly be seen sooner than his scheduled appointment with you on November 3.  - HD

## 2021-08-31 ENCOUNTER — Telehealth: Payer: Self-pay | Admitting: Pulmonary Disease

## 2021-08-31 ENCOUNTER — Other Ambulatory Visit: Payer: Self-pay

## 2021-08-31 DIAGNOSIS — D131 Benign neoplasm of stomach: Secondary | ICD-10-CM | POA: Diagnosis not present

## 2021-08-31 DIAGNOSIS — K529 Noninfective gastroenteritis and colitis, unspecified: Secondary | ICD-10-CM | POA: Diagnosis not present

## 2021-08-31 DIAGNOSIS — K2951 Unspecified chronic gastritis with bleeding: Secondary | ICD-10-CM | POA: Diagnosis not present

## 2021-08-31 DIAGNOSIS — D649 Anemia, unspecified: Secondary | ICD-10-CM | POA: Diagnosis not present

## 2021-08-31 DIAGNOSIS — K921 Melena: Secondary | ICD-10-CM | POA: Diagnosis not present

## 2021-08-31 DIAGNOSIS — J84112 Idiopathic pulmonary fibrosis: Secondary | ICD-10-CM | POA: Diagnosis not present

## 2021-08-31 DIAGNOSIS — Z8719 Personal history of other diseases of the digestive system: Secondary | ICD-10-CM

## 2021-08-31 DIAGNOSIS — R109 Unspecified abdominal pain: Secondary | ICD-10-CM | POA: Diagnosis not present

## 2021-08-31 DIAGNOSIS — K3189 Other diseases of stomach and duodenum: Secondary | ICD-10-CM | POA: Diagnosis not present

## 2021-08-31 DIAGNOSIS — R195 Other fecal abnormalities: Secondary | ICD-10-CM

## 2021-08-31 DIAGNOSIS — K222 Esophageal obstruction: Secondary | ICD-10-CM | POA: Diagnosis not present

## 2021-08-31 DIAGNOSIS — K449 Diaphragmatic hernia without obstruction or gangrene: Secondary | ICD-10-CM | POA: Diagnosis not present

## 2021-08-31 DIAGNOSIS — A0472 Enterocolitis due to Clostridium difficile, not specified as recurrent: Secondary | ICD-10-CM | POA: Diagnosis not present

## 2021-08-31 DIAGNOSIS — K5730 Diverticulosis of large intestine without perforation or abscess without bleeding: Secondary | ICD-10-CM | POA: Diagnosis not present

## 2021-08-31 DIAGNOSIS — K648 Other hemorrhoids: Secondary | ICD-10-CM | POA: Diagnosis not present

## 2021-08-31 NOTE — Telephone Encounter (Signed)
His iron level was normal and his liver function is normal as well.  The blood count is stable.   Patient's spouse, [name](DPR). [name] is requesting lab results and POC.  [name] is aware of labs results and voiced her understanding.   Dr.Gonzalez, please advise if okay to order for POC.

## 2021-08-31 NOTE — Telephone Encounter (Signed)
Labs entered into Epic. Appointment was scheduled with Brian Best NP on 09/01/2021 @ 0900. Pt wife Brian Bass made aware. Brian Bass verbalized understanding with all questions answered and was encouraged if her or her daughter had any further questions they could call our office. Brian Bass did state that her husband was doing better to day.

## 2021-08-31 NOTE — Telephone Encounter (Signed)
Left message for daughter to call back.

## 2021-09-01 ENCOUNTER — Ambulatory Visit: Payer: Medicare HMO | Admitting: Nurse Practitioner

## 2021-09-01 DIAGNOSIS — K922 Gastrointestinal hemorrhage, unspecified: Secondary | ICD-10-CM | POA: Diagnosis not present

## 2021-09-01 DIAGNOSIS — J9611 Chronic respiratory failure with hypoxia: Secondary | ICD-10-CM | POA: Diagnosis not present

## 2021-09-01 DIAGNOSIS — N401 Enlarged prostate with lower urinary tract symptoms: Secondary | ICD-10-CM | POA: Diagnosis not present

## 2021-09-01 DIAGNOSIS — N3000 Acute cystitis without hematuria: Secondary | ICD-10-CM | POA: Diagnosis not present

## 2021-09-01 DIAGNOSIS — G2581 Restless legs syndrome: Secondary | ICD-10-CM | POA: Diagnosis not present

## 2021-09-01 DIAGNOSIS — D649 Anemia, unspecified: Secondary | ICD-10-CM | POA: Diagnosis not present

## 2021-09-01 DIAGNOSIS — E871 Hypo-osmolality and hyponatremia: Secondary | ICD-10-CM | POA: Diagnosis not present

## 2021-09-01 DIAGNOSIS — J84112 Idiopathic pulmonary fibrosis: Secondary | ICD-10-CM | POA: Diagnosis not present

## 2021-09-01 DIAGNOSIS — E782 Mixed hyperlipidemia: Secondary | ICD-10-CM | POA: Diagnosis not present

## 2021-09-01 NOTE — Progress Notes (Deleted)
09/01/2021 BIJAN RIDGLEY [id number] [birth year]   Chief Complaint:  History of Present Illness: Brian Bass is an 80 year old male with a past medical history of CAD, AAA, DM II, COPD, IPF on oxygen, GERD, Schatzki's ring s/p dilatation 02/2019 and colon polyps. He is followed by Dr. Hilarie Fredrickson.   He went to [facility] ED 08/31/2021, per ER physician Guiaic positive brown colored stool Rectal: Dark brown stool, fecal occult blood test positive. Hg 12.7. Abd/pelvic CTA: 1. Small focus of active GI bleed within the distal rectum/anus. 2. Diverticulosis without diverticulitis. 3. Gastric wall thickening may represent gastritis or other etiologies. Follow-up recommended. 4. Lung fibrosis. Cardiomegaly.  Call 08/30/2021:  Pt did have c diff in June 2022 Hx of diverticulosis Anorectal pain raises questions of fissure or thrombosed external hemorrhoid.  Without exam difficult to know, also could be perianal abscess   Would recommend:  APP visit this week, if not available could see if a physician has any openings (I am hospital MD this week with no available clinic time) CBC, CMP, C diff PCR stool test (send STAT)  Past CT CT scan raised the question of cirrhosis and that the liver appeared small, slightly fatty with an irregular surface and relative enlargement of the caudate lobe.  Per last visit with Dr. Hilarie Fredrickson --CBC, CMP, INR, viral hepatitis serologies --Consider ultrasound with elastography at next office visit --I do not recommend variceal screening upper endoscopy at this time though we will discuss this more at follow-up  CBC Latest Ref Rng & Units 08/24/2021 04/19/2021 06/18/2020  WBC 4.0 - 10.5 K/uL 8.4 9.7 13.2(H)  Hemoglobin 13.0 - 17.0 g/dL 12.6(L) 13.1 13.8  Hematocrit 39.0 - 52.0 % 38.1(L) 38.2(L) 38.9  Platelets 150 - 400 K/uL 222 209.0 322    CMP Latest Ref Rng & Units 08/24/2021 04/19/2021 03/18/2021  Glucose 70 - 99 mg/dL - 136(H) 167(H)  BUN 6 - 23 mg/dL - 13 11   Creatinine 0.40 - 1.50 mg/dL - 1.01 1.00  Sodium 135 - 145 mEq/L - 131(L) 130(L)  Potassium 3.5 - 5.1 mEq/L - 4.6 4.6  Chloride 96 - 112 mEq/L - 97 97  CO2 19 - 32 mEq/L - 26 28  Calcium 8.4 - 10.5 mg/dL - 9.5 8.8  Total Protein 6.5 - 8.1 g/dL 7.6 7.6 7.1  Total Bilirubin 0.3 - 1.2 mg/dL 0.8 0.8 0.4  Alkaline Phos 38 - 126 U/L 94 92 77  AST 15 - 41 U/L _0 ALT 0 - 44 U/L _1 EGD 02/06/2019 by Dr. Hilarie Fredrickson: - Low-grade of narrowing Schatzki ring. Dilated to 16.5 mm. - 2 cm hiatal hernia. - Normal stomach. - Normal examined duodenum. - No specimens collected.   Colonoscopy 02/06/2019 by Dr. Hilarie Fredrickson: - One 5 mm polyp in the cecum, removed with a cold snare. Resected and retrieved. - Two 4 to 6 mm polyps in the ascending colon, removed with a cold snare. Resected and retrieved. - Four 3 to 6 mm polyps in the transverse colon, removed with a cold snare. Resected and retrieved. - One 5 mm polyp in the descending colon, removed with a cold snare. Resected and retrieved. - Two 4 to 5 mm polyps in the sigmoid colon, removed with a cold snare. Resected and retrieved. - Diverticulosis in the sigmoid colon. - Internal hemorrhoids.  ECHO 08/12/2021: Comments: Suboptimal apical window. IMPRESSIONS Left ventricular ejection fraction, by estimation, is 55 to 60%. The  left ventricle has normal function. The left ventricle has no regional wall motion abnormalities. There is mild left ventricular hypertrophy. Left ventricular diastolic parameters are consistent with Grade I diastolic dysfunction (impaired relaxation). 1. RVSP = 71.29mHg + RA pressure.. Right ventricular systolic function is low normal. The right ventricular size is not well visualized. There is severely elevated pulmonary artery systolic pressure. 2. 3. Left atrial size was mildly dilated. 4. The mitral valve is normal in structure. Moderate to severe mitral valve regurgitation. 5. Tricuspid valve  regurgitation is mild to moderate. The aortic valve is tricuspid. Aortic valve regurgitation is not visualized. Mild aortic valve stenosis. 6. Comparison(s): LVEF 60-60%, AV  Past Medical History:  Diagnosis Date   Abdominal aortic aneurysm (AAA) (HCC)    Adenomatous colon polyp    Allergy    Arthritis    C. difficile diarrhea    Cataract    Complication of anesthesia    Per pt, hard to wake up past last colon in 2011!   COPD (chronic obstructive pulmonary disease) (HCC)    Diabetes mellitus    Diverticulitis    Diverticulosis    Enlarged prostate    Esophageal stricture    GERD (gastroesophageal reflux disease)    Gout    Hiatal hernia    History of colon polyps 922/2011   Hyperlipidemia    IBS (irritable bowel syndrome)    Internal hemorrhoids    Pulmonary fibrosis (HCC)    Schatzki's ring    Past Surgical History:  Procedure Laterality Date   COLONOSCOPY     CORONARY ARTERY BYPASS GRAFT  10-2010   3 vessels   UPPER GASTROINTESTINAL ENDOSCOPY          Current Medications, Allergies, Past Medical History, Past Surgical History, Family History and Social History were reviewed in [employer].   Review of Systems:   Constitutional: Negative for fever, sweats, chills or weight loss.  Respiratory: Negative for shortness of breath.   Cardiovascular: Negative for chest pain, palpitations and leg swelling.  Gastrointestinal: See HPI.  Musculoskeletal: Negative for back pain or muscle aches.  Neurological: Negative for dizziness, headaches or paresthesias.    Physical Exam: There were no vitals taken for this visit. General: Well developed, w   ***male in no acute distress. Head: Normocephalic and atraumatic. Eyes: No scleral icterus. Conjunctiva pink . Ears: Normal auditory acuity. Mouth: Dentition intact. No ulcers or lesions.  Lungs: Clear throughout to auscultation. Heart: Regular rate and rhythm, no murmur. Abdomen: Soft,  nontender and nondistended. No masses or hepatomegaly. Normal bowel sounds x 4 quadrants.  Rectal: *** Musculoskeletal: Symmetrical with no gross deformities. Extremities: No edema. Neurological: Alert oriented x 4. No focal deficits.  Psychological: Alert and cooperative. Normal mood and affect  Assessment and Recommendations: ***

## 2021-09-01 NOTE — Telephone Encounter (Signed)
He had a POC previously but it is okay to order one.

## 2021-09-02 DIAGNOSIS — K922 Gastrointestinal hemorrhage, unspecified: Secondary | ICD-10-CM | POA: Diagnosis not present

## 2021-09-02 DIAGNOSIS — R3914 Feeling of incomplete bladder emptying: Secondary | ICD-10-CM | POA: Diagnosis not present

## 2021-09-02 DIAGNOSIS — N3000 Acute cystitis without hematuria: Secondary | ICD-10-CM | POA: Diagnosis not present

## 2021-09-02 DIAGNOSIS — A0472 Enterocolitis due to Clostridium difficile, not specified as recurrent: Secondary | ICD-10-CM | POA: Diagnosis not present

## 2021-09-02 DIAGNOSIS — N401 Enlarged prostate with lower urinary tract symptoms: Secondary | ICD-10-CM | POA: Diagnosis not present

## 2021-09-02 DIAGNOSIS — K222 Esophageal obstruction: Secondary | ICD-10-CM | POA: Diagnosis not present

## 2021-09-02 NOTE — Telephone Encounter (Signed)
Lm for Brian Bass(DPR).

## 2021-09-03 ENCOUNTER — Telehealth: Payer: Self-pay | Admitting: Pulmonary Disease

## 2021-09-03 DIAGNOSIS — E782 Mixed hyperlipidemia: Secondary | ICD-10-CM | POA: Diagnosis not present

## 2021-09-03 DIAGNOSIS — K2960 Other gastritis without bleeding: Secondary | ICD-10-CM | POA: Diagnosis not present

## 2021-09-03 DIAGNOSIS — K222 Esophageal obstruction: Secondary | ICD-10-CM | POA: Diagnosis not present

## 2021-09-03 DIAGNOSIS — R3914 Feeling of incomplete bladder emptying: Secondary | ICD-10-CM | POA: Diagnosis not present

## 2021-09-03 DIAGNOSIS — K649 Unspecified hemorrhoids: Secondary | ICD-10-CM | POA: Diagnosis not present

## 2021-09-03 DIAGNOSIS — K317 Polyp of stomach and duodenum: Secondary | ICD-10-CM | POA: Diagnosis not present

## 2021-09-03 DIAGNOSIS — K5730 Diverticulosis of large intestine without perforation or abscess without bleeding: Secondary | ICD-10-CM | POA: Diagnosis not present

## 2021-09-03 DIAGNOSIS — I10 Essential (primary) hypertension: Secondary | ICD-10-CM | POA: Diagnosis not present

## 2021-09-03 DIAGNOSIS — N401 Enlarged prostate with lower urinary tract symptoms: Secondary | ICD-10-CM | POA: Diagnosis not present

## 2021-09-03 DIAGNOSIS — R1310 Dysphagia, unspecified: Secondary | ICD-10-CM | POA: Diagnosis not present

## 2021-09-03 DIAGNOSIS — K2950 Unspecified chronic gastritis without bleeding: Secondary | ICD-10-CM | POA: Diagnosis not present

## 2021-09-03 DIAGNOSIS — N3000 Acute cystitis without hematuria: Secondary | ICD-10-CM | POA: Diagnosis not present

## 2021-09-03 DIAGNOSIS — A0472 Enterocolitis due to Clostridium difficile, not specified as recurrent: Secondary | ICD-10-CM | POA: Diagnosis not present

## 2021-09-03 DIAGNOSIS — D131 Benign neoplasm of stomach: Secondary | ICD-10-CM | POA: Diagnosis not present

## 2021-09-03 DIAGNOSIS — E119 Type 2 diabetes mellitus without complications: Secondary | ICD-10-CM | POA: Diagnosis not present

## 2021-09-03 DIAGNOSIS — K922 Gastrointestinal hemorrhage, unspecified: Secondary | ICD-10-CM | POA: Diagnosis not present

## 2021-09-03 NOTE — Telephone Encounter (Signed)
Lm x2 for patient's spouse, [name](DPR). Will close encounter per office protocol.

## 2021-09-03 NOTE — Addendum Note (Signed)
Addended by: Claudette Head A on: 09/03/2021 02:50 PM   Modules accepted: Orders

## 2021-09-03 NOTE — Telephone Encounter (Signed)
Rx has been placed in Dr. Domingo Dimes look at folder for signature.  Once signed, Rx will be faxed to [facility].

## 2021-09-03 NOTE — Telephone Encounter (Signed)
Spoke to patient's spouse, [name](dpr). [name] stated that she would contact her insurance person and ask for a fax number.  Will await call back.  Rx will need to be faxed insurance.

## 2021-09-04 DIAGNOSIS — R3914 Feeling of incomplete bladder emptying: Secondary | ICD-10-CM | POA: Diagnosis not present

## 2021-09-04 DIAGNOSIS — N3000 Acute cystitis without hematuria: Secondary | ICD-10-CM | POA: Diagnosis not present

## 2021-09-04 DIAGNOSIS — N401 Enlarged prostate with lower urinary tract symptoms: Secondary | ICD-10-CM | POA: Diagnosis not present

## 2021-09-04 DIAGNOSIS — A0472 Enterocolitis due to Clostridium difficile, not specified as recurrent: Secondary | ICD-10-CM | POA: Diagnosis not present

## 2021-09-04 DIAGNOSIS — K222 Esophageal obstruction: Secondary | ICD-10-CM | POA: Diagnosis not present

## 2021-09-04 DIAGNOSIS — K922 Gastrointestinal hemorrhage, unspecified: Secondary | ICD-10-CM | POA: Diagnosis not present

## 2021-09-05 DIAGNOSIS — A0472 Enterocolitis due to Clostridium difficile, not specified as recurrent: Secondary | ICD-10-CM | POA: Diagnosis not present

## 2021-09-05 DIAGNOSIS — K222 Esophageal obstruction: Secondary | ICD-10-CM | POA: Diagnosis not present

## 2021-09-05 DIAGNOSIS — R3914 Feeling of incomplete bladder emptying: Secondary | ICD-10-CM | POA: Diagnosis not present

## 2021-09-05 DIAGNOSIS — N3000 Acute cystitis without hematuria: Secondary | ICD-10-CM | POA: Diagnosis not present

## 2021-09-05 DIAGNOSIS — N401 Enlarged prostate with lower urinary tract symptoms: Secondary | ICD-10-CM | POA: Diagnosis not present

## 2021-09-05 DIAGNOSIS — K922 Gastrointestinal hemorrhage, unspecified: Secondary | ICD-10-CM | POA: Diagnosis not present

## 2021-09-06 DIAGNOSIS — K922 Gastrointestinal hemorrhage, unspecified: Secondary | ICD-10-CM | POA: Diagnosis not present

## 2021-09-06 DIAGNOSIS — N401 Enlarged prostate with lower urinary tract symptoms: Secondary | ICD-10-CM | POA: Diagnosis not present

## 2021-09-06 DIAGNOSIS — A0472 Enterocolitis due to Clostridium difficile, not specified as recurrent: Secondary | ICD-10-CM | POA: Diagnosis not present

## 2021-09-06 DIAGNOSIS — R3914 Feeling of incomplete bladder emptying: Secondary | ICD-10-CM | POA: Diagnosis not present

## 2021-09-06 DIAGNOSIS — N3000 Acute cystitis without hematuria: Secondary | ICD-10-CM | POA: Diagnosis not present

## 2021-09-09 NOTE — Telephone Encounter (Signed)
Opened in error.   Will close encounter.

## 2021-09-10 NOTE — Telephone Encounter (Signed)
Awaiting Rx to be signed.

## 2021-09-13 ENCOUNTER — Other Ambulatory Visit: Payer: Self-pay | Admitting: Pulmonary Disease

## 2021-09-14 ENCOUNTER — Telehealth: Payer: Self-pay | Admitting: Pulmonary Disease

## 2021-09-14 ENCOUNTER — Telehealth: Payer: Self-pay | Admitting: Family Medicine

## 2021-09-14 DIAGNOSIS — I10 Essential (primary) hypertension: Secondary | ICD-10-CM | POA: Diagnosis not present

## 2021-09-14 DIAGNOSIS — I2510 Atherosclerotic heart disease of native coronary artery without angina pectoris: Secondary | ICD-10-CM | POA: Diagnosis not present

## 2021-09-14 DIAGNOSIS — A0472 Enterocolitis due to Clostridium difficile, not specified as recurrent: Secondary | ICD-10-CM | POA: Diagnosis not present

## 2021-09-14 DIAGNOSIS — B9620 Unspecified Escherichia coli [E. coli] as the cause of diseases classified elsewhere: Secondary | ICD-10-CM | POA: Diagnosis not present

## 2021-09-14 DIAGNOSIS — D649 Anemia, unspecified: Secondary | ICD-10-CM | POA: Diagnosis not present

## 2021-09-14 DIAGNOSIS — N3000 Acute cystitis without hematuria: Secondary | ICD-10-CM | POA: Diagnosis not present

## 2021-09-14 DIAGNOSIS — K922 Gastrointestinal hemorrhage, unspecified: Secondary | ICD-10-CM | POA: Diagnosis not present

## 2021-09-14 DIAGNOSIS — J84112 Idiopathic pulmonary fibrosis: Secondary | ICD-10-CM | POA: Diagnosis not present

## 2021-09-14 DIAGNOSIS — E1142 Type 2 diabetes mellitus with diabetic polyneuropathy: Secondary | ICD-10-CM | POA: Diagnosis not present

## 2021-09-14 NOTE — Telephone Encounter (Signed)
Brian Bass(DPR) is aware of below message/recommendations and voiced her understanding.  She requested that I contact patient's home health nurse, Merry Proud and relay recommendations Merry Proud is aware and voiced his understanding.  Nothing further needed.

## 2021-09-14 NOTE — Telephone Encounter (Signed)
Okay to give verbal orders as requested.

## 2021-09-14 NOTE — Telephone Encounter (Signed)
Hold on the Ofev for 2 weeks to see if your appetite improves if there is no change resume the Ofev--copied from last OV note.  ATC [name], unable to leave voicemail due too mailbox being full.

## 2021-09-14 NOTE — Telephone Encounter (Signed)
Spoke to patient's spouse, [name](DPR) is aware of below message and voiced her understanding.  [name] stated that during patient's recent admission, patient was instructed to stop ofev.  Dr. Patsey Berthold, please advise. Thanks

## 2021-09-14 NOTE — Telephone Encounter (Signed)
During his last visit I was concerned about iron deficiency because of his newly developed restless leg syndrome.  His laboratory data was not very impressive however I see that he was admitted with a GI bleed.  In this situation he needs to stop the Ofev as this may have been the cause.

## 2021-09-14 NOTE — Telephone Encounter (Signed)
Home Health verbal orders [place] Agency Name: [place] number: [phone]  Requesting OT/PT/Skilled nursing/Social Work/Speech:  Reason:verbal orders for nursing,social worker and home health aid   Frequency: once a week for six weeks  Please forward to [facility] pool or providers CMA

## 2021-09-16 NOTE — Telephone Encounter (Signed)
Called Home Health and relayed verbal orders per Dr. Diona Browner. Merry Proud from [facility] relayed his appreciation for the call.

## 2021-09-16 NOTE — Telephone Encounter (Signed)
Rx has been faxed to [facility] per request.   Lm to make [name](DPR) aware.

## 2021-09-16 NOTE — Telephone Encounter (Signed)
[name](DPR) is aware of below message and voiced her understanding.  Nothing further needed.

## 2021-09-18 DIAGNOSIS — J849 Interstitial pulmonary disease, unspecified: Secondary | ICD-10-CM | POA: Diagnosis not present

## 2021-09-20 DIAGNOSIS — B9620 Unspecified Escherichia coli [E. coli] as the cause of diseases classified elsewhere: Secondary | ICD-10-CM | POA: Diagnosis not present

## 2021-09-20 DIAGNOSIS — I10 Essential (primary) hypertension: Secondary | ICD-10-CM | POA: Diagnosis not present

## 2021-09-20 DIAGNOSIS — N3000 Acute cystitis without hematuria: Secondary | ICD-10-CM | POA: Diagnosis not present

## 2021-09-20 DIAGNOSIS — D649 Anemia, unspecified: Secondary | ICD-10-CM | POA: Diagnosis not present

## 2021-09-20 DIAGNOSIS — K922 Gastrointestinal hemorrhage, unspecified: Secondary | ICD-10-CM | POA: Diagnosis not present

## 2021-09-20 DIAGNOSIS — E1142 Type 2 diabetes mellitus with diabetic polyneuropathy: Secondary | ICD-10-CM | POA: Diagnosis not present

## 2021-09-20 DIAGNOSIS — J84112 Idiopathic pulmonary fibrosis: Secondary | ICD-10-CM | POA: Diagnosis not present

## 2021-09-20 DIAGNOSIS — I2510 Atherosclerotic heart disease of native coronary artery without angina pectoris: Secondary | ICD-10-CM | POA: Diagnosis not present

## 2021-09-20 DIAGNOSIS — A0472 Enterocolitis due to Clostridium difficile, not specified as recurrent: Secondary | ICD-10-CM | POA: Diagnosis not present

## 2021-09-23 DIAGNOSIS — D649 Anemia, unspecified: Secondary | ICD-10-CM | POA: Diagnosis not present

## 2021-09-23 DIAGNOSIS — A0472 Enterocolitis due to Clostridium difficile, not specified as recurrent: Secondary | ICD-10-CM | POA: Diagnosis not present

## 2021-09-23 DIAGNOSIS — E1142 Type 2 diabetes mellitus with diabetic polyneuropathy: Secondary | ICD-10-CM | POA: Diagnosis not present

## 2021-09-23 DIAGNOSIS — K922 Gastrointestinal hemorrhage, unspecified: Secondary | ICD-10-CM | POA: Diagnosis not present

## 2021-09-23 DIAGNOSIS — N3000 Acute cystitis without hematuria: Secondary | ICD-10-CM | POA: Diagnosis not present

## 2021-09-23 DIAGNOSIS — J84112 Idiopathic pulmonary fibrosis: Secondary | ICD-10-CM | POA: Diagnosis not present

## 2021-09-23 DIAGNOSIS — I2510 Atherosclerotic heart disease of native coronary artery without angina pectoris: Secondary | ICD-10-CM | POA: Diagnosis not present

## 2021-09-23 DIAGNOSIS — I10 Essential (primary) hypertension: Secondary | ICD-10-CM | POA: Diagnosis not present

## 2021-09-23 DIAGNOSIS — B9620 Unspecified Escherichia coli [E. coli] as the cause of diseases classified elsewhere: Secondary | ICD-10-CM | POA: Diagnosis not present

## 2021-09-27 ENCOUNTER — Other Ambulatory Visit: Payer: Self-pay | Admitting: Family Medicine

## 2021-09-27 DIAGNOSIS — I2510 Atherosclerotic heart disease of native coronary artery without angina pectoris: Secondary | ICD-10-CM | POA: Diagnosis not present

## 2021-09-27 DIAGNOSIS — D649 Anemia, unspecified: Secondary | ICD-10-CM | POA: Diagnosis not present

## 2021-09-27 DIAGNOSIS — J84112 Idiopathic pulmonary fibrosis: Secondary | ICD-10-CM | POA: Diagnosis not present

## 2021-09-27 DIAGNOSIS — I10 Essential (primary) hypertension: Secondary | ICD-10-CM | POA: Diagnosis not present

## 2021-09-27 DIAGNOSIS — E1142 Type 2 diabetes mellitus with diabetic polyneuropathy: Secondary | ICD-10-CM | POA: Diagnosis not present

## 2021-09-27 DIAGNOSIS — B9620 Unspecified Escherichia coli [E. coli] as the cause of diseases classified elsewhere: Secondary | ICD-10-CM | POA: Diagnosis not present

## 2021-09-27 DIAGNOSIS — K922 Gastrointestinal hemorrhage, unspecified: Secondary | ICD-10-CM | POA: Diagnosis not present

## 2021-09-27 DIAGNOSIS — A0472 Enterocolitis due to Clostridium difficile, not specified as recurrent: Secondary | ICD-10-CM | POA: Diagnosis not present

## 2021-09-27 DIAGNOSIS — N3000 Acute cystitis without hematuria: Secondary | ICD-10-CM | POA: Diagnosis not present

## 2021-09-28 ENCOUNTER — Telehealth: Payer: Self-pay | Admitting: Family Medicine

## 2021-09-28 DIAGNOSIS — E1142 Type 2 diabetes mellitus with diabetic polyneuropathy: Secondary | ICD-10-CM | POA: Diagnosis not present

## 2021-09-28 DIAGNOSIS — I2510 Atherosclerotic heart disease of native coronary artery without angina pectoris: Secondary | ICD-10-CM | POA: Diagnosis not present

## 2021-09-28 DIAGNOSIS — N3000 Acute cystitis without hematuria: Secondary | ICD-10-CM | POA: Diagnosis not present

## 2021-09-28 DIAGNOSIS — J84112 Idiopathic pulmonary fibrosis: Secondary | ICD-10-CM | POA: Diagnosis not present

## 2021-09-28 DIAGNOSIS — A0472 Enterocolitis due to Clostridium difficile, not specified as recurrent: Secondary | ICD-10-CM | POA: Diagnosis not present

## 2021-09-28 DIAGNOSIS — K922 Gastrointestinal hemorrhage, unspecified: Secondary | ICD-10-CM | POA: Diagnosis not present

## 2021-09-28 DIAGNOSIS — B9620 Unspecified Escherichia coli [E. coli] as the cause of diseases classified elsewhere: Secondary | ICD-10-CM | POA: Diagnosis not present

## 2021-09-28 DIAGNOSIS — D649 Anemia, unspecified: Secondary | ICD-10-CM | POA: Diagnosis not present

## 2021-09-28 DIAGNOSIS — I10 Essential (primary) hypertension: Secondary | ICD-10-CM | POA: Diagnosis not present

## 2021-09-28 NOTE — Telephone Encounter (Signed)
Home Health verbal orders [place] number: [phone]  [id number] OT/PT/Skilled nursing/Social Work/Speech:  Reason:OT  Frequency:1x week 6  Please forward to [facility] pool or providers CMA

## 2021-09-28 NOTE — Telephone Encounter (Signed)
Home Health verbal orders [place]  Agency Name: [place] number: [phone]  Requesting OT/PT/Skilled nursing/Social Work/Speech:  Reason: seven physical therapy sessions  Frequency: once a week for 7 weeks  Please forward to [facility] pool or providers CMA

## 2021-09-28 NOTE — Telephone Encounter (Signed)
Verbal orders given to [facility] via telephone for OT 1 x week for 6 weeks.

## 2021-09-28 NOTE — Telephone Encounter (Signed)
Left message for [facility] giving verbal orders for PT 1 x week for 7 weeks.

## 2021-09-29 ENCOUNTER — Other Ambulatory Visit: Payer: Self-pay | Admitting: Family Medicine

## 2021-09-29 NOTE — Telephone Encounter (Signed)
Last office visit 04/27/2021 for [place].  Remeron is not on current medication list.  Next Appt; 11/02/2021 for DM.

## 2021-09-30 DIAGNOSIS — I10 Essential (primary) hypertension: Secondary | ICD-10-CM | POA: Diagnosis not present

## 2021-09-30 DIAGNOSIS — I2510 Atherosclerotic heart disease of native coronary artery without angina pectoris: Secondary | ICD-10-CM | POA: Diagnosis not present

## 2021-09-30 DIAGNOSIS — D649 Anemia, unspecified: Secondary | ICD-10-CM | POA: Diagnosis not present

## 2021-09-30 DIAGNOSIS — A0472 Enterocolitis due to Clostridium difficile, not specified as recurrent: Secondary | ICD-10-CM | POA: Diagnosis not present

## 2021-09-30 DIAGNOSIS — J84112 Idiopathic pulmonary fibrosis: Secondary | ICD-10-CM | POA: Diagnosis not present

## 2021-09-30 DIAGNOSIS — B9620 Unspecified Escherichia coli [E. coli] as the cause of diseases classified elsewhere: Secondary | ICD-10-CM | POA: Diagnosis not present

## 2021-09-30 DIAGNOSIS — K922 Gastrointestinal hemorrhage, unspecified: Secondary | ICD-10-CM | POA: Diagnosis not present

## 2021-09-30 DIAGNOSIS — N3000 Acute cystitis without hematuria: Secondary | ICD-10-CM | POA: Diagnosis not present

## 2021-09-30 DIAGNOSIS — E1142 Type 2 diabetes mellitus with diabetic polyneuropathy: Secondary | ICD-10-CM | POA: Diagnosis not present

## 2021-10-01 DIAGNOSIS — N3000 Acute cystitis without hematuria: Secondary | ICD-10-CM | POA: Diagnosis not present

## 2021-10-01 DIAGNOSIS — D649 Anemia, unspecified: Secondary | ICD-10-CM | POA: Diagnosis not present

## 2021-10-01 DIAGNOSIS — K922 Gastrointestinal hemorrhage, unspecified: Secondary | ICD-10-CM | POA: Diagnosis not present

## 2021-10-01 DIAGNOSIS — E1142 Type 2 diabetes mellitus with diabetic polyneuropathy: Secondary | ICD-10-CM | POA: Diagnosis not present

## 2021-10-01 DIAGNOSIS — I2510 Atherosclerotic heart disease of native coronary artery without angina pectoris: Secondary | ICD-10-CM | POA: Diagnosis not present

## 2021-10-01 DIAGNOSIS — B9620 Unspecified Escherichia coli [E. coli] as the cause of diseases classified elsewhere: Secondary | ICD-10-CM | POA: Diagnosis not present

## 2021-10-01 DIAGNOSIS — A0472 Enterocolitis due to Clostridium difficile, not specified as recurrent: Secondary | ICD-10-CM | POA: Diagnosis not present

## 2021-10-01 DIAGNOSIS — I10 Essential (primary) hypertension: Secondary | ICD-10-CM | POA: Diagnosis not present

## 2021-10-01 DIAGNOSIS — J84112 Idiopathic pulmonary fibrosis: Secondary | ICD-10-CM | POA: Diagnosis not present

## 2021-10-05 DIAGNOSIS — N3000 Acute cystitis without hematuria: Secondary | ICD-10-CM | POA: Diagnosis not present

## 2021-10-05 DIAGNOSIS — D649 Anemia, unspecified: Secondary | ICD-10-CM | POA: Diagnosis not present

## 2021-10-05 DIAGNOSIS — A0472 Enterocolitis due to Clostridium difficile, not specified as recurrent: Secondary | ICD-10-CM | POA: Diagnosis not present

## 2021-10-05 DIAGNOSIS — I10 Essential (primary) hypertension: Secondary | ICD-10-CM | POA: Diagnosis not present

## 2021-10-05 DIAGNOSIS — K922 Gastrointestinal hemorrhage, unspecified: Secondary | ICD-10-CM | POA: Diagnosis not present

## 2021-10-05 DIAGNOSIS — J84112 Idiopathic pulmonary fibrosis: Secondary | ICD-10-CM | POA: Diagnosis not present

## 2021-10-05 DIAGNOSIS — B9620 Unspecified Escherichia coli [E. coli] as the cause of diseases classified elsewhere: Secondary | ICD-10-CM | POA: Diagnosis not present

## 2021-10-05 DIAGNOSIS — E1142 Type 2 diabetes mellitus with diabetic polyneuropathy: Secondary | ICD-10-CM | POA: Diagnosis not present

## 2021-10-05 DIAGNOSIS — R339 Retention of urine, unspecified: Secondary | ICD-10-CM | POA: Diagnosis not present

## 2021-10-05 DIAGNOSIS — I2510 Atherosclerotic heart disease of native coronary artery without angina pectoris: Secondary | ICD-10-CM | POA: Diagnosis not present

## 2021-10-06 ENCOUNTER — Other Ambulatory Visit (INDEPENDENT_AMBULATORY_CARE_PROVIDER_SITE_OTHER): Payer: Medicare HMO

## 2021-10-06 ENCOUNTER — Ambulatory Visit: Payer: Medicare HMO | Admitting: Internal Medicine

## 2021-10-06 ENCOUNTER — Encounter: Payer: Self-pay | Admitting: Internal Medicine

## 2021-10-06 ENCOUNTER — Other Ambulatory Visit: Payer: Medicare HMO

## 2021-10-06 VITALS — BP 102/60 | HR 94 | Ht 66.0 in | Wt 128.0 lb

## 2021-10-06 DIAGNOSIS — J84112 Idiopathic pulmonary fibrosis: Secondary | ICD-10-CM | POA: Diagnosis not present

## 2021-10-06 DIAGNOSIS — E1142 Type 2 diabetes mellitus with diabetic polyneuropathy: Secondary | ICD-10-CM | POA: Diagnosis not present

## 2021-10-06 DIAGNOSIS — D131 Benign neoplasm of stomach: Secondary | ICD-10-CM | POA: Diagnosis not present

## 2021-10-06 DIAGNOSIS — R630 Anorexia: Secondary | ICD-10-CM

## 2021-10-06 DIAGNOSIS — B9620 Unspecified Escherichia coli [E. coli] as the cause of diseases classified elsewhere: Secondary | ICD-10-CM | POA: Diagnosis not present

## 2021-10-06 DIAGNOSIS — A0472 Enterocolitis due to Clostridium difficile, not specified as recurrent: Secondary | ICD-10-CM | POA: Diagnosis not present

## 2021-10-06 DIAGNOSIS — Z8619 Personal history of other infectious and parasitic diseases: Secondary | ICD-10-CM | POA: Diagnosis not present

## 2021-10-06 DIAGNOSIS — K219 Gastro-esophageal reflux disease without esophagitis: Secondary | ICD-10-CM

## 2021-10-06 DIAGNOSIS — D649 Anemia, unspecified: Secondary | ICD-10-CM | POA: Diagnosis not present

## 2021-10-06 DIAGNOSIS — R300 Dysuria: Secondary | ICD-10-CM

## 2021-10-06 DIAGNOSIS — I2510 Atherosclerotic heart disease of native coronary artery without angina pectoris: Secondary | ICD-10-CM | POA: Diagnosis not present

## 2021-10-06 DIAGNOSIS — N3000 Acute cystitis without hematuria: Secondary | ICD-10-CM | POA: Diagnosis not present

## 2021-10-06 DIAGNOSIS — I10 Essential (primary) hypertension: Secondary | ICD-10-CM | POA: Diagnosis not present

## 2021-10-06 DIAGNOSIS — K922 Gastrointestinal hemorrhage, unspecified: Secondary | ICD-10-CM | POA: Diagnosis not present

## 2021-10-06 LAB — COMPREHENSIVE METABOLIC PANEL
ALT: 15 U/L (ref 0–53)
AST: 18 U/L (ref 0–37)
Albumin: 3.6 g/dL (ref 3.5–5.2)
Alkaline Phosphatase: 78 U/L (ref 39–117)
BUN: 22 mg/dL (ref 6–23)
CO2: 30 mEq/L (ref 19–32)
Calcium: 9.4 mg/dL (ref 8.4–10.5)
Chloride: 99 mEq/L (ref 96–112)
Creatinine, Ser: 0.79 mg/dL (ref 0.40–1.50)
GFR: 83.44 mL/min (ref >60.00)
Glucose, Bld: 192 mg/dL — ABNORMAL HIGH (ref 70–99)
Potassium: 4.9 mEq/L (ref 3.5–5.1)
Sodium: 134 mEq/L — ABNORMAL LOW (ref 135–145)
Total Bilirubin: 0.5 mg/dL (ref 0.2–1.2)
Total Protein: 7.1 g/dL (ref 6.0–8.3)

## 2021-10-06 LAB — CBC WITH DIFFERENTIAL/PLATELET
Basophils Absolute: 0 10*3/uL (ref 0.0–0.1)
Basophils Relative: 0.3 % (ref 0.0–3.0)
Eosinophils Absolute: 0.3 10*3/uL (ref 0.0–0.7)
Eosinophils Relative: 3.3 % (ref 0.0–5.0)
HCT: 33.8 % — ABNORMAL LOW (ref 39.0–52.0)
Hemoglobin: 11.1 g/dL — ABNORMAL LOW (ref 13.0–17.0)
Lymphocytes Relative: 25.3 % (ref 12.0–46.0)
Lymphs Abs: 2.4 10*3/uL (ref 0.7–4.0)
MCHC: 32.9 g/dL (ref 30.0–36.0)
MCV: 91.5 fl (ref 78.0–100.0)
Monocytes Absolute: 1 10*3/uL (ref 0.1–1.0)
Monocytes Relative: 10.6 % (ref 3.0–12.0)
Neutro Abs: 5.7 10*3/uL (ref 1.4–7.7)
Neutrophils Relative %: 60.5 % (ref 43.0–77.0)
Platelets: 253 10*3/uL (ref 150.0–400.0)
RBC: 3.69 Mil/uL — ABNORMAL LOW (ref 4.22–5.81)
RDW: 14.8 % (ref 11.5–15.5)
WBC: 9.5 10*3/uL (ref 4.0–10.5)

## 2021-10-06 LAB — URINALYSIS, ROUTINE W REFLEX MICROSCOPIC
Bilirubin Urine: NEGATIVE
Ketones, ur: NEGATIVE
Nitrite: POSITIVE — AB
Specific Gravity, Urine: 1.01 (ref 1.000–1.030)
Total Protein, Urine: NEGATIVE
Urine Glucose: NEGATIVE
Urobilinogen, UA: 0.2 (ref 0.0–1.0)
pH: 6 (ref 5.0–8.0)

## 2021-10-06 LAB — FERRITIN: Ferritin: 172.9 ng/mL (ref 22.0–322.0)

## 2021-10-06 MED ORDER — OMEPRAZOLE 20 MG PO CPDR: 20.0000 mg | DELAYED_RELEASE_CAPSULE | Freq: Two times a day (BID) | ORAL | 1 refills | Status: DC

## 2021-10-06 NOTE — Patient Instructions (Signed)
Your provider has requested that you go to the basement level for lab work before leaving today. Press "B" on the elevator. The lab is located at the first door on the left as you exit the elevator.  Continue omeprazole 20 mg daily  Restart Mirtazapine 15 mg daily.  If you are age 81 or older, your body mass index should be between 23-30. Your Body mass index is 20.66 kg/m. If this is out of the aforementioned range listed, please consider follow up with your Primary Care Provider.  If you are age 76 or younger, your body mass index should be between 19-25. Your Body mass index is 20.66 kg/m. If this is out of the aformentioned range listed, please consider follow up with your Primary Care Provider.   ________________________________________________________  The [facility] providers would like to encourage you to use [facility] to communicate with providers for non-urgent requests or questions.  Due to long hold times on the telephone, sending your provider a message by [facility] may be a faster and more efficient way to get a response.  Please allow 48 business hours for a response.  Please remember that this is for non-urgent requests.  _______________________________________________________  Due to recent changes in healthcare laws, you may see the results of your imaging and laboratory studies on MyChart before your provider has had a chance to review them.  We understand that in some cases there may be results that are confusing or concerning to you. Not all laboratory results come back in the same time frame and the provider may be waiting for multiple results in order to interpret others.  Please give [name] 48 hours in order for your provider to thoroughly review all the results before contacting the office for clarification of your results.

## 2021-10-06 NOTE — Progress Notes (Signed)
Subjective:    Patient ID: Brian Bass, male    DOB: September 28, 1941, 80 y.o.   MRN: [id number]  HPI Brian Bass is an 80 year old male with a history of GERD with Schatzki's ring, history of multiple adenomatous colon polyps, colonic diverticulosis, IPF, diabetes, gout who is seen for follow-up.  He was also recently hospitalized with dark stools, 2 g drop in hemoglobin, minor rectal bleeding, and C. difficile colitis.  He is here today with his daughter.  He reports that he had dark stools and diarrhea and was admitted to a [facility] hospital.  During this time he was treated for C. difficile colitis.  He also underwent upper endoscopy and colonoscopy.  See below for details.  He reports he remains quite weak.  He has been off of his Ofev medication for his IPF because it may have been associated with GI bleeding.  He is waiting to see Dr. Patsey Berthold later this month with pulmonary.  He has had no further trouble with diarrhea.  No abdominal pain.  Appetite has been poor.  No nausea or vomiting.  No further blood in stool or melena.  He has had some dysuria.  EGD/colon -- done at [facility] in Sept 2022 during hospital stay Date of procedure: 09/03/2021 Physician: Reather Laurence, MD  Procedure: Colonoscopy   Preprocedure Indication: GI bleeding, C difficile  Postprocedure Impression: hemorrhoids, diverticulosis  Recommendations: 1. Trend hemoglobin, transfuse as needed 2. Preparation H as needed 3. Fiber/water 4. Resume prior diet and medications 5. Continue treatment for C difficile 6. Supportive care  Findings:  There were multiple mild diverticula in the descending colon and sigmoid colon. No active bleeding was seen and there was no blood noted in the colon  Internal hemorrhoids. There was a small ulceration with stigmata of recent bleeding over one of these hemorrhoids.  The terminal ileum, cecum, ascending colon and transverse colon appeared normal.  Date of procedure: 09/03/2021  Physician: Reather Laurence, MD  Procedure: EGD  Preprocedure Indication: GI bleeding, dysphagia  Postprocedure Impression: gastritis, gastric polyp, esophageal stricture  Recommendations: 1. Follow up pathology 2. PPI 3. Proceed to colonoscopy  Findings:  There was a stricture noted at the level of the GE junction. The stricture was moderate, and the scope was able to pass without dilation. The stricture was dilated with [facility] dilator to 46 Fr ending size. Post dilation inspection revealed improvement in the stricture  There was erythematous mucosa in the antrum; performed cold forceps biopsy  Small hiatal hernia  A 37m polyp was noted in the body of the stomach; performed cold forceps biopsy  The duodenum appeared normal.   Final Diagnosis    1.  Stomach, biopsy:  - Antral and oxyntic-type gastric mucosa with minimal chronic inflammation. - An immunohistochemical stain for Helicobacter pylori is negative.   2.  Stomach, biopsy:  - Fragment of adenoma; negative for high-grade dysplasia. - No morphologic evidence of H. pylori seen.       Review of Systems     Objective:   Physical Exam BP 102/60   Pulse 94   Ht _0  (1.676 m)   Wt 128 lb (58.1 kg)   BMI 20.66 kg/m  Gen: awake, alert, NAD HEENT: anicteric, op clear CV: RRR, no mrg Pulm: CTA b/l Abd: soft, NT/ND, +BS throughout Ext: no c/c/e Neuro: nonfocal . INDICATION: abdominal pain, hematochezia, eval for GI bleed findings  COMPARISON:  05/13/2021   TECHNIQUE:  CT ANGIO ABDOMEN PELVIS - Contrast: 80 mL  IOPAMIDOL 76 % IV SOLN. Dose reduction was utilized (automated exposure control, mA or kV adjustment based on patient size, or iterative image reconstruction). Coronal and sagittal reformatted images were obtained. Image postprocessing was performed on independent workstation creating multi planar 2-D and 3-D MIPS shaded rendered surface or 3-D volume rendered images for review and comparison.   FINDINGS:  #   Lower Thorax: Fibrosis with honeycombing in visualized lung bases. Cardiomegaly.  #  Liver: Unremarkable.  #  Gallbladder: Normal gallbladder.  #  Pancreas: Normal.  #  Spleen: Normal.  #  Adrenal Glands: Normal.  #  Kidneys: No renal stones or hydronephrosis bilaterally.  #  Stomach:Gastric wall thickening. Small hiatal hernia. .  #  Bowel: Nonobstructive bowel gas pattern. No bowel wall inflammation. Diverticulosis coli. Small focus of IV contrast blush is identified in distal rectum/anus  #  Appendix: Normal appendix.  #  Peritoneal Cavity: No pneumoperitoneum.  No lymphadenopathy.  #  Urinary Bladder: Unremarkable.  #  Pelvis: No free pelvic fluid.  #  Bones: No acute fractures or dislocations. No osseous destructive lesions. Multilevel spondylosis. Generalized osteopenia.  #    IMPRESSION:  1. Small focus of active GI bleed within the distal rectum/anus.  2.  Diverticulosis without diverticulitis.  3.  Gastric wall thickening may represent gastritis or other etiologies. Follow-up recommended.  4.  Lung fibrosis. Cardiomegaly.    Electronically Signed by: Leslie Dales on 09/01/2021 12:26 AM      Assessment & Plan:  80 year old male with a history of GERD with Schatzki's ring, history of multiple adenomatous colon polyps, colonic diverticulosis, IPF, diabetes, gout who is seen for follow-up.   Recent C. difficile colitis --treated with vancomycin.  Diarrhea has resolved.  He has been off of cholestyramine.  Monitor for reoccurrence and be judicious with antibiotics in the future. --Remain off cholestyramine  2.  Recent concern of GI bleed/gastritis/gastric adenoma/diverticulosis --EGD and colonoscopy performed in the hospital recently.  He did have gastritis and he remains on omeprazole 20 mg daily.  Upper endoscopy revealed a small adenoma in the stomach.  Colonoscopy with internal hemorrhoids and diverticulosis.  No further bleeding. --Monitor hemoglobin --We discussed that I  would like to repeat upper endoscopy given his gastric adenoma to ensure that this was resected entirely; however given his severe IPF and oxygen requirement as well as debility I do not think that this is indicated currently.  Both he and his daughter understand.  I am not certain if surveillance will be necessary given his other more significant issues particularly with his lung disease --Continue omeprazole 20 mg daily --Ondansetron 4 mg 3 times daily as needed can be used for nausea  3.  Abnormal liver imaging --CT scan previously suggested possible nodularity of the hepatic contour, this was not seen on a recent CT angiography performed in September.  No clinical evidence of decompensated liver disease.  No further evaluation needed for this issue currently.  4.  Dysuria --UA and culture today.  Need to be specific and targeted with antibiotic should these be needed given recent C. Difficile  5.  Poor appetite --he has been off of mirtazapine since discharge, I recommended he resume at previous dose 15 mg a day to try to improve appetite.

## 2021-10-07 ENCOUNTER — Telehealth: Payer: Self-pay | Admitting: Internal Medicine

## 2021-10-07 DIAGNOSIS — E1142 Type 2 diabetes mellitus with diabetic polyneuropathy: Secondary | ICD-10-CM | POA: Diagnosis not present

## 2021-10-07 DIAGNOSIS — K922 Gastrointestinal hemorrhage, unspecified: Secondary | ICD-10-CM | POA: Diagnosis not present

## 2021-10-07 DIAGNOSIS — D649 Anemia, unspecified: Secondary | ICD-10-CM | POA: Diagnosis not present

## 2021-10-07 DIAGNOSIS — I10 Essential (primary) hypertension: Secondary | ICD-10-CM | POA: Diagnosis not present

## 2021-10-07 DIAGNOSIS — A0472 Enterocolitis due to Clostridium difficile, not specified as recurrent: Secondary | ICD-10-CM | POA: Diagnosis not present

## 2021-10-07 DIAGNOSIS — B9620 Unspecified Escherichia coli [E. coli] as the cause of diseases classified elsewhere: Secondary | ICD-10-CM | POA: Diagnosis not present

## 2021-10-07 DIAGNOSIS — I2510 Atherosclerotic heart disease of native coronary artery without angina pectoris: Secondary | ICD-10-CM | POA: Diagnosis not present

## 2021-10-07 DIAGNOSIS — N3000 Acute cystitis without hematuria: Secondary | ICD-10-CM | POA: Diagnosis not present

## 2021-10-07 DIAGNOSIS — J84112 Idiopathic pulmonary fibrosis: Secondary | ICD-10-CM | POA: Diagnosis not present

## 2021-10-07 NOTE — Telephone Encounter (Signed)
Patient saw Dr. Hilarie Fredrickson yesterday, and doctor wanted him to continue Mirtazapine, 15 mg, one by mouth at bedtime.  Patient is requesting refill, sent to [facility] in [place].  Please call patient's wife to let her know if this can be filled.  Thank you.

## 2021-10-07 NOTE — Telephone Encounter (Signed)
I advised [name] (patient's wife) that it appears Dr Diona Browner sent #30 w/11 refills of mirtazapine 15 mg on 09/29/21. I told her just to give [facility] a call and ask that they fill the order if not already completed. She verbalizes understanding.

## 2021-10-08 ENCOUNTER — Other Ambulatory Visit: Payer: Self-pay | Admitting: Family Medicine

## 2021-10-08 ENCOUNTER — Telehealth: Payer: Self-pay | Admitting: Internal Medicine

## 2021-10-08 LAB — URINE CULTURE
MICRO NUMBER:: 12583758
SPECIMEN QUALITY:: ADEQUATE

## 2021-10-08 MED ORDER — FOSFOMYCIN TROMETHAMINE 3 G PO PACK: 3.0000 g | PACK | Freq: Once | ORAL | 0 refills | Status: AC

## 2021-10-08 MED ORDER — SACCHAROMYCES BOULARDII 250 MG PO CAPS: 250.0000 mg | ORAL_CAPSULE | Freq: Two times a day (BID) | ORAL | 0 refills | Status: DC

## 2021-10-08 NOTE — Telephone Encounter (Signed)
I have spoken to patient's wife, [name] to advise that per Dr Hilarie Fredrickson, Dr Diona Browner indicated she would contact patient/family with plan.

## 2021-10-08 NOTE — Telephone Encounter (Signed)
Patients daughter called regarding results indicating he has a UTI and she is wondering if he will be given any medication.

## 2021-10-11 DIAGNOSIS — D649 Anemia, unspecified: Secondary | ICD-10-CM | POA: Diagnosis not present

## 2021-10-11 DIAGNOSIS — I2510 Atherosclerotic heart disease of native coronary artery without angina pectoris: Secondary | ICD-10-CM | POA: Diagnosis not present

## 2021-10-11 DIAGNOSIS — K922 Gastrointestinal hemorrhage, unspecified: Secondary | ICD-10-CM | POA: Diagnosis not present

## 2021-10-11 DIAGNOSIS — I10 Essential (primary) hypertension: Secondary | ICD-10-CM | POA: Diagnosis not present

## 2021-10-11 DIAGNOSIS — B9620 Unspecified Escherichia coli [E. coli] as the cause of diseases classified elsewhere: Secondary | ICD-10-CM | POA: Diagnosis not present

## 2021-10-11 DIAGNOSIS — A0472 Enterocolitis due to Clostridium difficile, not specified as recurrent: Secondary | ICD-10-CM | POA: Diagnosis not present

## 2021-10-11 DIAGNOSIS — J84112 Idiopathic pulmonary fibrosis: Secondary | ICD-10-CM | POA: Diagnosis not present

## 2021-10-11 DIAGNOSIS — N3000 Acute cystitis without hematuria: Secondary | ICD-10-CM | POA: Diagnosis not present

## 2021-10-11 DIAGNOSIS — E1142 Type 2 diabetes mellitus with diabetic polyneuropathy: Secondary | ICD-10-CM | POA: Diagnosis not present

## 2021-10-13 ENCOUNTER — Other Ambulatory Visit: Payer: Self-pay | Admitting: Pulmonary Disease

## 2021-10-14 DIAGNOSIS — J84112 Idiopathic pulmonary fibrosis: Secondary | ICD-10-CM | POA: Diagnosis not present

## 2021-10-14 DIAGNOSIS — I2510 Atherosclerotic heart disease of native coronary artery without angina pectoris: Secondary | ICD-10-CM | POA: Diagnosis not present

## 2021-10-14 DIAGNOSIS — A0472 Enterocolitis due to Clostridium difficile, not specified as recurrent: Secondary | ICD-10-CM | POA: Diagnosis not present

## 2021-10-14 DIAGNOSIS — K922 Gastrointestinal hemorrhage, unspecified: Secondary | ICD-10-CM | POA: Diagnosis not present

## 2021-10-14 DIAGNOSIS — B9620 Unspecified Escherichia coli [E. coli] as the cause of diseases classified elsewhere: Secondary | ICD-10-CM | POA: Diagnosis not present

## 2021-10-14 DIAGNOSIS — E1142 Type 2 diabetes mellitus with diabetic polyneuropathy: Secondary | ICD-10-CM | POA: Diagnosis not present

## 2021-10-14 DIAGNOSIS — D649 Anemia, unspecified: Secondary | ICD-10-CM | POA: Diagnosis not present

## 2021-10-14 DIAGNOSIS — I10 Essential (primary) hypertension: Secondary | ICD-10-CM | POA: Diagnosis not present

## 2021-10-14 DIAGNOSIS — N3000 Acute cystitis without hematuria: Secondary | ICD-10-CM | POA: Diagnosis not present

## 2021-10-15 DIAGNOSIS — J84112 Idiopathic pulmonary fibrosis: Secondary | ICD-10-CM | POA: Diagnosis not present

## 2021-10-15 DIAGNOSIS — D649 Anemia, unspecified: Secondary | ICD-10-CM | POA: Diagnosis not present

## 2021-10-15 DIAGNOSIS — B9620 Unspecified Escherichia coli [E. coli] as the cause of diseases classified elsewhere: Secondary | ICD-10-CM | POA: Diagnosis not present

## 2021-10-15 DIAGNOSIS — N3000 Acute cystitis without hematuria: Secondary | ICD-10-CM | POA: Diagnosis not present

## 2021-10-15 DIAGNOSIS — I2510 Atherosclerotic heart disease of native coronary artery without angina pectoris: Secondary | ICD-10-CM | POA: Diagnosis not present

## 2021-10-15 DIAGNOSIS — E1142 Type 2 diabetes mellitus with diabetic polyneuropathy: Secondary | ICD-10-CM | POA: Diagnosis not present

## 2021-10-15 DIAGNOSIS — K922 Gastrointestinal hemorrhage, unspecified: Secondary | ICD-10-CM | POA: Diagnosis not present

## 2021-10-15 DIAGNOSIS — I10 Essential (primary) hypertension: Secondary | ICD-10-CM | POA: Diagnosis not present

## 2021-10-15 DIAGNOSIS — A0472 Enterocolitis due to Clostridium difficile, not specified as recurrent: Secondary | ICD-10-CM | POA: Diagnosis not present

## 2021-10-19 ENCOUNTER — Telehealth: Payer: Self-pay | Admitting: Family Medicine

## 2021-10-19 DIAGNOSIS — J84112 Idiopathic pulmonary fibrosis: Secondary | ICD-10-CM | POA: Diagnosis not present

## 2021-10-19 DIAGNOSIS — K922 Gastrointestinal hemorrhage, unspecified: Secondary | ICD-10-CM | POA: Diagnosis not present

## 2021-10-19 DIAGNOSIS — E1142 Type 2 diabetes mellitus with diabetic polyneuropathy: Secondary | ICD-10-CM | POA: Diagnosis not present

## 2021-10-19 DIAGNOSIS — D649 Anemia, unspecified: Secondary | ICD-10-CM | POA: Diagnosis not present

## 2021-10-19 DIAGNOSIS — N3000 Acute cystitis without hematuria: Secondary | ICD-10-CM | POA: Diagnosis not present

## 2021-10-19 DIAGNOSIS — I2510 Atherosclerotic heart disease of native coronary artery without angina pectoris: Secondary | ICD-10-CM | POA: Diagnosis not present

## 2021-10-19 DIAGNOSIS — B9620 Unspecified Escherichia coli [E. coli] as the cause of diseases classified elsewhere: Secondary | ICD-10-CM | POA: Diagnosis not present

## 2021-10-19 DIAGNOSIS — I10 Essential (primary) hypertension: Secondary | ICD-10-CM | POA: Diagnosis not present

## 2021-10-19 DIAGNOSIS — A0472 Enterocolitis due to Clostridium difficile, not specified as recurrent: Secondary | ICD-10-CM | POA: Diagnosis not present

## 2021-10-19 NOTE — Telephone Encounter (Signed)
Left message with [facility] to have Maggie return my call in regards to verbal orders for Mr. Harada.

## 2021-10-19 NOTE — Telephone Encounter (Signed)
Home Health verbal orders [place] Name:[place] Agency Name: [place] number: [phone]  Requesting OT/PT/Skilled nursing/Social Work/Speech:  Reason:Extended Home Health with Home Aid  Frequency:1 wk for 2 wks  Please forward to [facility] pool or providers CMA

## 2021-10-19 NOTE — Telephone Encounter (Signed)
Verbal orders given to [facility] via telephone to extend home health aide for 1 x week for 2 weeks.

## 2021-10-20 DIAGNOSIS — D649 Anemia, unspecified: Secondary | ICD-10-CM | POA: Diagnosis not present

## 2021-10-20 DIAGNOSIS — E1142 Type 2 diabetes mellitus with diabetic polyneuropathy: Secondary | ICD-10-CM | POA: Diagnosis not present

## 2021-10-20 DIAGNOSIS — K922 Gastrointestinal hemorrhage, unspecified: Secondary | ICD-10-CM | POA: Diagnosis not present

## 2021-10-20 DIAGNOSIS — J84112 Idiopathic pulmonary fibrosis: Secondary | ICD-10-CM | POA: Diagnosis not present

## 2021-10-20 DIAGNOSIS — I2510 Atherosclerotic heart disease of native coronary artery without angina pectoris: Secondary | ICD-10-CM | POA: Diagnosis not present

## 2021-10-20 DIAGNOSIS — I10 Essential (primary) hypertension: Secondary | ICD-10-CM | POA: Diagnosis not present

## 2021-10-20 DIAGNOSIS — A0472 Enterocolitis due to Clostridium difficile, not specified as recurrent: Secondary | ICD-10-CM | POA: Diagnosis not present

## 2021-10-20 DIAGNOSIS — N3000 Acute cystitis without hematuria: Secondary | ICD-10-CM | POA: Diagnosis not present

## 2021-10-20 DIAGNOSIS — B9620 Unspecified Escherichia coli [E. coli] as the cause of diseases classified elsewhere: Secondary | ICD-10-CM | POA: Diagnosis not present

## 2021-10-21 ENCOUNTER — Encounter: Payer: Self-pay | Admitting: Pulmonary Disease

## 2021-10-21 ENCOUNTER — Other Ambulatory Visit: Payer: Self-pay

## 2021-10-21 ENCOUNTER — Ambulatory Visit: Payer: Medicare HMO | Admitting: Pulmonary Disease

## 2021-10-21 VITALS — BP 110/62 | HR 85 | Temp 97.0°F | Ht 66.0 in | Wt 132.0 lb

## 2021-10-21 DIAGNOSIS — Z7189 Other specified counseling: Secondary | ICD-10-CM | POA: Diagnosis not present

## 2021-10-21 DIAGNOSIS — N3000 Acute cystitis without hematuria: Secondary | ICD-10-CM | POA: Diagnosis not present

## 2021-10-21 DIAGNOSIS — R0609 Other forms of dyspnea: Secondary | ICD-10-CM

## 2021-10-21 DIAGNOSIS — I272 Pulmonary hypertension, unspecified: Secondary | ICD-10-CM | POA: Diagnosis not present

## 2021-10-21 DIAGNOSIS — I2510 Atherosclerotic heart disease of native coronary artery without angina pectoris: Secondary | ICD-10-CM | POA: Diagnosis not present

## 2021-10-21 DIAGNOSIS — F32A Depression, unspecified: Secondary | ICD-10-CM | POA: Diagnosis not present

## 2021-10-21 DIAGNOSIS — E1142 Type 2 diabetes mellitus with diabetic polyneuropathy: Secondary | ICD-10-CM | POA: Diagnosis not present

## 2021-10-21 DIAGNOSIS — B9620 Unspecified Escherichia coli [E. coli] as the cause of diseases classified elsewhere: Secondary | ICD-10-CM | POA: Diagnosis not present

## 2021-10-21 DIAGNOSIS — D649 Anemia, unspecified: Secondary | ICD-10-CM | POA: Diagnosis not present

## 2021-10-21 DIAGNOSIS — I10 Essential (primary) hypertension: Secondary | ICD-10-CM | POA: Diagnosis not present

## 2021-10-21 DIAGNOSIS — J84112 Idiopathic pulmonary fibrosis: Secondary | ICD-10-CM | POA: Diagnosis not present

## 2021-10-21 DIAGNOSIS — A0472 Enterocolitis due to Clostridium difficile, not specified as recurrent: Secondary | ICD-10-CM | POA: Diagnosis not present

## 2021-10-21 DIAGNOSIS — K922 Gastrointestinal hemorrhage, unspecified: Secondary | ICD-10-CM | POA: Diagnosis not present

## 2021-10-21 NOTE — Patient Instructions (Signed)
We will see you in follow-up in 6 to 8 weeks.  Use your oxygen during exertion and during sleep without fail.

## 2021-10-21 NOTE — Progress Notes (Signed)
Subjective:    Patient ID: Brian Bass, male    DOB: Apr 26, 1941, 80 y.o.   MRN: [id number]  Chief Complaint  Patient presents with   Follow-up    ILD    HPI Patient is an 80 year old lifelong never smoker with IPF who follows here for the same.  This is scheduled visit.  His last visit here was on 24 August 2021.  At that time he complained of issues with restless legs.  We ordered iron studies and CBC.  He had continue taking Ofev despite advised to stop it to see if he did better with regards to weight loss and anorexia.  He was unable to take the recommended dose of Ofev due to GI side effects.   I recommended to at least stop 2 weeks to see if there would be any change in his appetite but he never followed through with this.  After that visit the patient was admitted at [facility] due to GI bleeding.  He was noted to have C. difficile as well.  Colonoscopy performed on 30 September showed diverticulosis and hemorrhoids, EGD showed gastritis, gastric polyp and esophageal stricture.  Ofev was discontinued at that time as it is associated with potential GI bleeding.  Since stopping the Ofev his appetite has improved dramatically he is gaining weight.  Cough is well controlled with Breztri.  Main issue now is that he has persistent issues with depression.  He has started mirtazapine for that.  He continues to be able to participate in activities of daily living in a limited fashion due to dyspnea.  He has not had any fevers, chills or sweats.  No nausea or vomiting. No abdominal pain.   DATA: Serology 02/27/18>> ACE, rheumatoid factor, ANA negative. PFTs 02/27/18:FVC is 63% predicted, FEV1 of 70% predicted, ratio 86% predicted.  There is no significant change with bronchodilator therapy.  Flow volume loop is consistent with restriction.DLCO is 54% predicted, DLCO is reduced at 56%. CT high-resolution 02/27/18>> the bibasilar fibrotic changes have advanced significantly in the subpleural areas of  traction bronchiectasis changes have worsened.  Right paratracheal lymphadenopathy minimally changed. Chest x-ray 02/08/18; diffuse groundglass and interstitial changes throughout both lungs, right diaphragm is elevated.  Interstitial changes has progressed significantly since previous chest x-ray on 09/06/13.  CT chest 10/21/13, bilateral emphysematous changes, bilateral subpleural reticular changes consistent with pulmonary fibrosis.  Chemistry 01/23/2019>> AST, ALT normal. Overnight oximetry 10/08/2018>> performed on room air; time less than or equal to 88% was 0.7 minutes.  Oxygen was not required. ONOX 11/13/2019 no significant desaturations though cyclical desats to 52% were noted. CT chest high resolution 02/05/2020 persistent severe fibrotic changes unchanged from 3/19 however significantly progressed from 2014. 2D echo 02/05/2020 LVEF 60 to 65%, moderate pulmonary hypertension CT chest high-resolution 03/16/2021: ILD compatible with UIP with very minimal to no progression from prior. PFTs 04/13/2021: Restrictive physiology of moderate degree, FEV1 1.38 L or 58% predicted, FVC 1.82 L or 53% predicted.  FEV1/FVC 76%, no bronchodilator response.  Diffusion capacity moderately impaired. 2D echo 08/12/2021: LVEF 55 to 60%, mild LVH, grade 1 DD, moderate to severe mitral valve regurgitation, severe pulmonary hypertension.  Since prior 2D echo pulmonary hypertension has worsened.  Review of Systems A 10 point review of systems was performed and it is as noted above otherwise negative.  Past Medical History:  Diagnosis Date   Abdominal aortic aneurysm (AAA)    Adenomatous colon polyp    Allergy    Arthritis  C. difficile diarrhea    Cataract    Complication of anesthesia    Per pt, hard to wake up past last colon in 2011!   COPD (chronic obstructive pulmonary disease) (HCC)    Diabetes mellitus    Diverticulitis    Diverticulosis    Enlarged prostate    Esophageal stricture    GERD  (gastroesophageal reflux disease)    Gout    Hiatal hernia    History of colon polyps 922/2011   Hyperlipidemia    IBS (irritable bowel syndrome)    Internal hemorrhoids    Pulmonary fibrosis (HCC)    Schatzki's ring    Social History   Tobacco Use   Smoking status: Never   Smokeless tobacco: Never  Substance Use Topics   Alcohol use: No   Allergies  Allergen Reactions   Ciprofloxacin Swelling     Severe joint pain and swelling   Current Meds  Medication Sig   ACCU-CHEK FASTCLIX LANCETS MISC 1 Device by Other route 3 (three) times daily. Check blood sugars 2 to 3 times daily.  Dx: 250.02   ACCU-CHEK SMARTVIEW test strip TEST BLOOD SUGAR 2 TO 3 TIMES DAILY   Blood Glucose Monitoring Suppl (ACCU-CHEK NANO SMARTVIEW) W/DEVICE KIT 1 kit by Other route 3 (three) times daily. Use to check blood sugars 2 to 3 times daily.  Dx: 250.02   Budeson-Glycopyrrol-Formoterol (BREZTRI AEROSPHERE) 160-9-4.8 MCG/ACT AERO Inhale 2 puffs into the lungs in the morning and at bedtime.   cholestyramine (QUESTRAN) 4 g packet Take 1 packet (4 g total) by mouth daily.   cholestyramine light (PREVALITE) 4 g packet Take 1 packet (4 g total) by mouth 2 (two) times daily as needed (Diarrhea).   ciprofloxacin (CIPRO) 500 MG tablet Take 500 mg by mouth 2 (two) times daily.   ezetimibe (ZETIA) 10 MG tablet TAKE 1 TABLET BY MOUTH DAILY   fenofibrate 160 MG tablet Take 1 tablet (160 mg total) by mouth daily.   gabapentin (NEURONTIN) 300 MG capsule Take by mouth.   hydrOXYzine (VISTARIL) 25 MG capsule Take 1 capsule (25 mg total) by mouth 3 (three) times daily as needed for anxiety or nausea.   Insulin Syringe-Needle U-100 (DROPLET INSULIN SYRINGE) 31G X 5/16" 0.3 ML MISC USE TO INJECT LANTUS INSULIN 2 TIMES DAILY   LANTUS 100 UNIT/ML injection INJECT 55 UNITS SQ TWICE A DAY   meloxicam (MOBIC) 7.5 MG tablet Take 1 tablet (7.5 mg total) by mouth daily.   metFORMIN (GLUCOPHAGE) 500 MG tablet Take by mouth.    mirtazapine (REMERON) 15 MG tablet TAKE 1 TABLET BY MOUTH AT BEDTIME   mirtazapine (REMERON) 15 MG tablet Take by mouth.   MITIGARE 0.6 MG CAPS FOR FLARE TAKE 2 CAPSULES ONCE THEN TAKE1 CAPSULE IN 2 HOURS IF PAIN CONTINUES, THEN TAKE 1 CAPSULE DAILY   Multiple Vitamin (MULTIVITAMIN) tablet Take 1 tablet by mouth daily.   Nintedanib (OFEV) 150 MG CAPS Take 150 mg by mouth 2 (two) times daily.   omeprazole (PRILOSEC) 20 MG capsule Take 1 capsule (20 mg total) by mouth 2 (two) times daily before a meal.   ondansetron (ZOFRAN) 4 MG tablet Take 1 tablet (4 mg total) by mouth 3 (three) times daily as needed for nausea or vomiting. TAKE ONE TABLET BY MOUTH EVERY DAY AS NEEDED FOR NAUSEA   saccharomyces boulardii (FLORASTOR) 250 MG capsule Take 1 capsule (250 mg total) by mouth 2 (two) times daily.   simvastatin (ZOCOR) 40 MG tablet Take  1 tablet (40 mg total) by mouth at bedtime.   tamsulosin (FLOMAX) 0.4 MG CAPS capsule TAKE 1 CAPSULE EVERY DAY IF NOT IMPROVING INCREASE TO TAKE 2 CAPSULES EVERY DAY   *Nintedanib has been on hold due to episodes of GI bleeding     Objective:   Physical Exam BP 110/62 (BP Location: Left Arm, Patient Position: Sitting, Cuff Size: Normal)   Pulse 85   Temp (!) 97 F (36.1 C)   Ht _0  (1.676 m)   Wt 132 lb (59.9 kg)   SpO2 96%   BMI 21.31 kg/m     GENERAL:  Thin, chronically ill-appearing, awake, no respiratory distress.  No conversational dyspnea. Fully ambulatory today.  No eye contact, appears withdrawn. HEAD: Normocephalic, atraumatic.  EYES: Pupils equal, round, reactive to light.  No scleral icterus.  MOUTH: Nose/mouth/throat not examined due to masking requirements for COVID 19. NECK: Supple. No thyromegaly. Trachea midline. No JVD.  No adenopathy. PULMONARY: Good air entry bilaterally.   Mild Velcro crackles at bases otherwise, no adventitious sounds. CARDIOVASCULAR: S1 and S2. Regular rate and rhythm.  Grade 2/6 systolic ejection murmur at the left  sternal border, no gallop.  No rubs. ABDOMEN:.  Benign. MUSCULOSKELETAL: No joint deformity, no clubbing, no edema.  NEUROLOGIC: No overt focal deficits.  Gait not tested.  Speech is fluent. SKIN: Intact,warm,dry.  On limited exam no rashes or lesions. PSYCH: Mood depressed, withdrawn  Assessment & Plan:     ICD-10-CM   1. IPF (idiopathic pulmonary fibrosis) ([place])  J84.112    Ofev discontinued due to undesirable side effects Appetite and weight improving off of Ofev No further episodes of GI bleeding    2. DOE (dyspnea on exertion)  R06.09    At baseline, no worsening Advised to use oxygen when exerting himself    3. Severe pulmonary hypertension (HCC) - Group 3H  I27.20    Multifactorial Significant mitral disease Significant lung disease    4. Counseling regarding end of life decision making  Z21.89    Initiated conversation Patient and wife would not engage in discussion    5. Depression, unspecified depression type  F32.A    Per primary MD On mirtazapine     We will see the patient in follow-up in 6 to 8 weeks time he is to contact us prior to that time should any new difficulties arise.  Renold Don, MD Advanced Bronchoscopy PCCM [facility]    *This note was dictated using voice recognition software/Dragon.  Despite best efforts to proofread, errors can occur which can change the meaning.  Any change was purely unintentional.

## 2021-10-25 ENCOUNTER — Telehealth: Payer: Self-pay | Admitting: Family Medicine

## 2021-10-25 DIAGNOSIS — J84112 Idiopathic pulmonary fibrosis: Secondary | ICD-10-CM | POA: Diagnosis not present

## 2021-10-25 DIAGNOSIS — Z794 Long term (current) use of insulin: Secondary | ICD-10-CM

## 2021-10-25 DIAGNOSIS — E1142 Type 2 diabetes mellitus with diabetic polyneuropathy: Secondary | ICD-10-CM | POA: Diagnosis not present

## 2021-10-25 DIAGNOSIS — A0472 Enterocolitis due to Clostridium difficile, not specified as recurrent: Secondary | ICD-10-CM | POA: Diagnosis not present

## 2021-10-25 DIAGNOSIS — D649 Anemia, unspecified: Secondary | ICD-10-CM | POA: Diagnosis not present

## 2021-10-25 DIAGNOSIS — N3000 Acute cystitis without hematuria: Secondary | ICD-10-CM | POA: Diagnosis not present

## 2021-10-25 DIAGNOSIS — K922 Gastrointestinal hemorrhage, unspecified: Secondary | ICD-10-CM | POA: Diagnosis not present

## 2021-10-25 DIAGNOSIS — I10 Essential (primary) hypertension: Secondary | ICD-10-CM | POA: Diagnosis not present

## 2021-10-25 DIAGNOSIS — E118 Type 2 diabetes mellitus with unspecified complications: Secondary | ICD-10-CM

## 2021-10-25 DIAGNOSIS — B9620 Unspecified Escherichia coli [E. coli] as the cause of diseases classified elsewhere: Secondary | ICD-10-CM | POA: Diagnosis not present

## 2021-10-25 DIAGNOSIS — I2510 Atherosclerotic heart disease of native coronary artery without angina pectoris: Secondary | ICD-10-CM | POA: Diagnosis not present

## 2021-10-25 NOTE — Telephone Encounter (Signed)
-----  Message from Ellamae Sia sent at 10/12/2021  2:55 PM EST ----- Regarding: Lab orders for Tuesday, 11.22.22 Lab orders for f/u

## 2021-10-26 ENCOUNTER — Other Ambulatory Visit: Payer: Self-pay

## 2021-10-26 ENCOUNTER — Other Ambulatory Visit (INDEPENDENT_AMBULATORY_CARE_PROVIDER_SITE_OTHER): Payer: Medicare HMO

## 2021-10-26 ENCOUNTER — Other Ambulatory Visit: Payer: Medicare HMO

## 2021-10-26 DIAGNOSIS — N3000 Acute cystitis without hematuria: Secondary | ICD-10-CM | POA: Diagnosis not present

## 2021-10-26 DIAGNOSIS — Z794 Long term (current) use of insulin: Secondary | ICD-10-CM | POA: Diagnosis not present

## 2021-10-26 DIAGNOSIS — J84112 Idiopathic pulmonary fibrosis: Secondary | ICD-10-CM | POA: Diagnosis not present

## 2021-10-26 DIAGNOSIS — I10 Essential (primary) hypertension: Secondary | ICD-10-CM | POA: Diagnosis not present

## 2021-10-26 DIAGNOSIS — A0472 Enterocolitis due to Clostridium difficile, not specified as recurrent: Secondary | ICD-10-CM | POA: Diagnosis not present

## 2021-10-26 DIAGNOSIS — E118 Type 2 diabetes mellitus with unspecified complications: Secondary | ICD-10-CM

## 2021-10-26 DIAGNOSIS — I2510 Atherosclerotic heart disease of native coronary artery without angina pectoris: Secondary | ICD-10-CM | POA: Diagnosis not present

## 2021-10-26 DIAGNOSIS — D649 Anemia, unspecified: Secondary | ICD-10-CM | POA: Diagnosis not present

## 2021-10-26 DIAGNOSIS — E1142 Type 2 diabetes mellitus with diabetic polyneuropathy: Secondary | ICD-10-CM | POA: Diagnosis not present

## 2021-10-26 DIAGNOSIS — B9620 Unspecified Escherichia coli [E. coli] as the cause of diseases classified elsewhere: Secondary | ICD-10-CM | POA: Diagnosis not present

## 2021-10-26 DIAGNOSIS — K922 Gastrointestinal hemorrhage, unspecified: Secondary | ICD-10-CM | POA: Diagnosis not present

## 2021-10-26 LAB — LIPID PANEL
Cholesterol: 136 mg/dL (ref 0–200)
HDL: 40 mg/dL (ref >39.00)
NonHDL: 95.77
Total CHOL/HDL Ratio: 3
Triglycerides: 212 mg/dL — ABNORMAL HIGH (ref 0.0–149.0)
VLDL: 42.4 mg/dL — ABNORMAL HIGH (ref 0.0–40.0)

## 2021-10-26 LAB — COMPREHENSIVE METABOLIC PANEL
ALT: 16 U/L (ref 0–53)
AST: 18 U/L (ref 0–37)
Albumin: 3.9 g/dL (ref 3.5–5.2)
Alkaline Phosphatase: 91 U/L (ref 39–117)
BUN: 20 mg/dL (ref 6–23)
CO2: 26 mEq/L (ref 19–32)
Calcium: 9.4 mg/dL (ref 8.4–10.5)
Chloride: 98 mEq/L (ref 96–112)
Creatinine, Ser: 0.84 mg/dL (ref 0.40–1.50)
GFR: 81.88 mL/min (ref >60.00)
Glucose, Bld: 235 mg/dL — ABNORMAL HIGH (ref 70–99)
Potassium: 4.4 mEq/L (ref 3.5–5.1)
Sodium: 129 mEq/L — ABNORMAL LOW (ref 135–145)
Total Bilirubin: 0.4 mg/dL (ref 0.2–1.2)
Total Protein: 7.4 g/dL (ref 6.0–8.3)

## 2021-10-26 LAB — LDL CHOLESTEROL, DIRECT: Direct LDL: 81 mg/dL

## 2021-10-26 LAB — HEMOGLOBIN A1C: Hgb A1c MFr Bld: 7.1 % — ABNORMAL HIGH (ref 4.6–6.5)

## 2021-10-27 DIAGNOSIS — E1142 Type 2 diabetes mellitus with diabetic polyneuropathy: Secondary | ICD-10-CM | POA: Diagnosis not present

## 2021-10-27 DIAGNOSIS — A0472 Enterocolitis due to Clostridium difficile, not specified as recurrent: Secondary | ICD-10-CM | POA: Diagnosis not present

## 2021-10-27 DIAGNOSIS — B9620 Unspecified Escherichia coli [E. coli] as the cause of diseases classified elsewhere: Secondary | ICD-10-CM | POA: Diagnosis not present

## 2021-10-27 DIAGNOSIS — N3000 Acute cystitis without hematuria: Secondary | ICD-10-CM | POA: Diagnosis not present

## 2021-10-27 DIAGNOSIS — J84112 Idiopathic pulmonary fibrosis: Secondary | ICD-10-CM | POA: Diagnosis not present

## 2021-10-27 DIAGNOSIS — I10 Essential (primary) hypertension: Secondary | ICD-10-CM | POA: Diagnosis not present

## 2021-10-27 DIAGNOSIS — D649 Anemia, unspecified: Secondary | ICD-10-CM | POA: Diagnosis not present

## 2021-10-27 DIAGNOSIS — K922 Gastrointestinal hemorrhage, unspecified: Secondary | ICD-10-CM | POA: Diagnosis not present

## 2021-10-27 DIAGNOSIS — I2510 Atherosclerotic heart disease of native coronary artery without angina pectoris: Secondary | ICD-10-CM | POA: Diagnosis not present

## 2021-11-01 DIAGNOSIS — J84112 Idiopathic pulmonary fibrosis: Secondary | ICD-10-CM | POA: Diagnosis not present

## 2021-11-01 DIAGNOSIS — N3000 Acute cystitis without hematuria: Secondary | ICD-10-CM | POA: Diagnosis not present

## 2021-11-01 DIAGNOSIS — I10 Essential (primary) hypertension: Secondary | ICD-10-CM | POA: Diagnosis not present

## 2021-11-01 DIAGNOSIS — B9620 Unspecified Escherichia coli [E. coli] as the cause of diseases classified elsewhere: Secondary | ICD-10-CM | POA: Diagnosis not present

## 2021-11-01 DIAGNOSIS — D649 Anemia, unspecified: Secondary | ICD-10-CM | POA: Diagnosis not present

## 2021-11-01 DIAGNOSIS — A0472 Enterocolitis due to Clostridium difficile, not specified as recurrent: Secondary | ICD-10-CM | POA: Diagnosis not present

## 2021-11-01 DIAGNOSIS — I2510 Atherosclerotic heart disease of native coronary artery without angina pectoris: Secondary | ICD-10-CM | POA: Diagnosis not present

## 2021-11-01 DIAGNOSIS — E1142 Type 2 diabetes mellitus with diabetic polyneuropathy: Secondary | ICD-10-CM | POA: Diagnosis not present

## 2021-11-01 DIAGNOSIS — K922 Gastrointestinal hemorrhage, unspecified: Secondary | ICD-10-CM | POA: Diagnosis not present

## 2021-11-02 ENCOUNTER — Ambulatory Visit (INDEPENDENT_AMBULATORY_CARE_PROVIDER_SITE_OTHER): Payer: Medicare HMO | Admitting: Family Medicine

## 2021-11-02 ENCOUNTER — Encounter: Payer: Self-pay | Admitting: Family Medicine

## 2021-11-02 ENCOUNTER — Other Ambulatory Visit: Payer: Self-pay

## 2021-11-02 VITALS — BP 100/64 | HR 60 | Temp 98.1°F | Ht 66.0 in | Wt 133.1 lb

## 2021-11-02 DIAGNOSIS — B9689 Other specified bacterial agents as the cause of diseases classified elsewhere: Secondary | ICD-10-CM | POA: Insufficient documentation

## 2021-11-02 DIAGNOSIS — J849 Interstitial pulmonary disease, unspecified: Secondary | ICD-10-CM | POA: Diagnosis not present

## 2021-11-02 DIAGNOSIS — E1140 Type 2 diabetes mellitus with diabetic neuropathy, unspecified: Secondary | ICD-10-CM

## 2021-11-02 DIAGNOSIS — Z1612 Extended spectrum beta lactamase (ESBL) resistance: Secondary | ICD-10-CM | POA: Diagnosis not present

## 2021-11-02 DIAGNOSIS — Z794 Long term (current) use of insulin: Secondary | ICD-10-CM

## 2021-11-02 DIAGNOSIS — Z23 Encounter for immunization: Secondary | ICD-10-CM

## 2021-11-02 DIAGNOSIS — B9629 Other Escherichia coli [E. coli] as the cause of diseases classified elsewhere: Secondary | ICD-10-CM | POA: Diagnosis not present

## 2021-11-02 DIAGNOSIS — F321 Major depressive disorder, single episode, moderate: Secondary | ICD-10-CM

## 2021-11-02 DIAGNOSIS — G2581 Restless legs syndrome: Secondary | ICD-10-CM | POA: Diagnosis not present

## 2021-11-02 DIAGNOSIS — N390 Urinary tract infection, site not specified: Secondary | ICD-10-CM | POA: Insufficient documentation

## 2021-11-02 DIAGNOSIS — E1169 Type 2 diabetes mellitus with other specified complication: Secondary | ICD-10-CM

## 2021-11-02 DIAGNOSIS — R339 Retention of urine, unspecified: Secondary | ICD-10-CM | POA: Diagnosis not present

## 2021-11-02 DIAGNOSIS — E785 Hyperlipidemia, unspecified: Secondary | ICD-10-CM

## 2021-11-02 LAB — POC URINALSYSI DIPSTICK (AUTOMATED)
Bilirubin, UA: NEGATIVE
Glucose, UA: NEGATIVE
Ketones, UA: NEGATIVE
Nitrite, UA: POSITIVE
Protein, UA: NEGATIVE
Spec Grav, UA: 1.015 (ref 1.010–1.025)
Urobilinogen, UA: 0.2 E.U./dL
pH, UA: 6 (ref 5.0–8.0)

## 2021-11-02 MED ORDER — GLIPIZIDE ER 10 MG PO TB24
10.0000 mg | ORAL_TABLET | Freq: Every day | ORAL | 11 refills | Status: DC
Start: 2021-11-02 — End: 2021-12-23

## 2021-11-02 MED ORDER — ROPINIROLE HCL 0.25 MG PO TABS
ORAL_TABLET | ORAL | 11 refills | Status: AC
Start: 2021-11-02 — End: ?

## 2021-11-02 NOTE — Progress Notes (Signed)
Patient ID: Brian Bass, male    DOB: 1941-08-20, 80 y.o.   MRN: [id number]  This visit was conducted in person.  BP 100/64   Pulse 60   Temp 98.1 F (36.7 C) (Temporal)   Ht _0  (1.676 m)   Wt 133 lb 2 oz (60.4 kg)   SpO2 98%   BMI 21.49 kg/m    CC: Chief Complaint  Patient presents with   Diabetes    Subjective:   HPI: Brian Bass is a 80 y.o. male  with severe ILD on oxygen presenting on 11/02/2021 for Diabetes    ILD: Ofev discontinued given undesired SE.  No further GI bleeding.  Medicare Wellness Visit with nurse, followed by  86 minute CPE with Dr. Daiva Nakayama is helping with urine flow  He has not had to use the catheters lately  No dysuria.   Urine  culture  ESBL EColi... treated with fosfomycin.   Good BM daily.   Wt Readings from Last 3 Encounters:  11/02/21 133 lb 2 oz (60.4 kg)  10/21/21 132 lb (59.9 kg)  10/06/21 128 lb (58.1 kg)    Diabetes:   On lantus 55 Units SQ BID  Stopped metformin. Glipizide Lab Results  Component Value Date   HGBA1C 7.1 (H) 10/26/2021  Using medications without difficulties: Hypoglycemic episodes: Hyperglycemic episodes: Feet problems: Blood Sugars averaging: eye exam within last year:   He has been having issues with restless leg syndrome... he has sore in bottom feet.  Feels like leg jumping, creepy crawly. Feels better if he gets up and walks around.    Depression: moderate control on Remeron.. helping with appetite   Daughter asked about possible prophylactic antibiotics.  Relevant past medical, surgical, family and social history reviewed and updated as indicated. Interim medical history since our last visit reviewed. Allergies and medications reviewed and updated. Outpatient Medications Prior to Visit  Medication Sig Dispense Refill   ACCU-CHEK FASTCLIX LANCETS MISC 1 Device by Other route 3 (three) times daily. Check blood sugars 2 to 3 times daily.  Dx: 250.02 102 each 5    ACCU-CHEK SMARTVIEW test strip TEST BLOOD SUGAR 2 TO 3 TIMES DAILY 300 strip 3   Blood Glucose Monitoring Suppl (ACCU-CHEK NANO SMARTVIEW) W/DEVICE KIT 1 kit by Other route 3 (three) times daily. Use to check blood sugars 2 to 3 times daily.  Dx: 250.02 1 kit 0   Budeson-Glycopyrrol-Formoterol (BREZTRI AEROSPHERE) 160-9-4.8 MCG/ACT AERO Inhale 2 puffs into the lungs in the morning and at bedtime. 10.7 g 11   ezetimibe (ZETIA) 10 MG tablet TAKE 1 TABLET BY MOUTH DAILY 90 tablet 1   Insulin Syringe-Needle U-100 (DROPLET INSULIN SYRINGE) 31G X 5/16" 0.3 ML MISC USE TO INJECT LANTUS INSULIN 2 TIMES DAILY 200 each 3   LANTUS 100 UNIT/ML injection INJECT 55 UNITS SQ TWICE A DAY 100 mL 0   mirtazapine (REMERON) 15 MG tablet TAKE 1 TABLET BY MOUTH AT BEDTIME 30 tablet 11   MITIGARE 0.6 MG CAPS FOR FLARE TAKE 2 CAPSULES ONCE THEN TAKE1 CAPSULE IN 2 HOURS IF PAIN CONTINUES, THEN TAKE 1 CAPSULE DAILY 60 capsule 0   Multiple Vitamin (MULTIVITAMIN) tablet Take 1 tablet by mouth daily.     omeprazole (PRILOSEC) 20 MG capsule Take 1 capsule (20 mg total) by mouth 2 (two) times daily before a meal. 180 capsule 1   ondansetron (ZOFRAN) 4 MG tablet Take 1 tablet (4 mg total) by mouth  3 (three) times daily as needed for nausea or vomiting. TAKE ONE TABLET BY MOUTH EVERY DAY AS NEEDED FOR NAUSEA 60 tablet 2   saccharomyces boulardii (FLORASTOR) 250 MG capsule Take 1 capsule (250 mg total) by mouth 2 (two) times daily. 60 capsule 0   tamsulosin (FLOMAX) 0.4 MG CAPS capsule TAKE 1 CAPSULE EVERY DAY IF NOT IMPROVING INCREASE TO TAKE 2 CAPSULES EVERY DAY (Patient taking differently: TAKE 2 CAPSULES EVERY DAY) 60 capsule 5   fenofibrate 160 MG tablet Take 1 tablet (160 mg total) by mouth daily. 90 tablet 1   fosfomycin (MONUROL) 3 g PACK SMARTSIG:3 Gram(s) By Mouth Once (Patient not taking: Reported on 10/21/2021)     gabapentin (NEURONTIN) 300 MG capsule TAKE ONE CAPSULE AT BEDTIME (Patient not taking: Reported on  10/21/2021) 30 capsule 0   meloxicam (MOBIC) 7.5 MG tablet Take 1 tablet (7.5 mg total) by mouth daily. (Patient not taking: Reported on 11/02/2021) 30 tablet 0   metFORMIN (GLUCOPHAGE) 500 MG tablet Take by mouth. (Patient not taking: Reported on 11/02/2021)     benzonatate (TESSALON) 100 MG capsule TAKE 2 CAPSULES BY MOUTH 3 TIMES DAILY (Patient not taking: Reported on 10/21/2021) 180 capsule 3   cholestyramine (QUESTRAN) 4 g packet Take 1 packet (4 g total) by mouth daily. 30 each 2   cholestyramine light (PREVALITE) 4 g packet Take 1 packet (4 g total) by mouth 2 (two) times daily as needed (Diarrhea). 60 each 3   ciprofloxacin (CIPRO) 500 MG tablet Take 500 mg by mouth 2 (two) times daily.     gabapentin (NEURONTIN) 300 MG capsule Take by mouth.     hydrOXYzine (VISTARIL) 25 MG capsule Take 1 capsule (25 mg total) by mouth 3 (three) times daily as needed for anxiety or nausea. 30 capsule 0   mirtazapine (REMERON) 15 MG tablet Take by mouth.     Nintedanib (OFEV) 150 MG CAPS Take 150 mg by mouth 2 (two) times daily.     simvastatin (ZOCOR) 40 MG tablet Take 1 tablet (40 mg total) by mouth at bedtime. (Patient not taking: Reported on 11/02/2021) 90 tablet 2   No facility-administered medications prior to visit.     Per HPI unless specifically indicated in ROS section below Review of Systems  Constitutional:  Positive for fatigue. Negative for fever.  HENT:  Negative for ear pain.   Eyes:  Negative for pain.  Respiratory:  Positive for shortness of breath. Negative for cough.   Cardiovascular:  Negative for chest pain, palpitations and leg swelling.  Gastrointestinal:  Negative for abdominal pain.  Genitourinary:  Negative for dysuria.  Musculoskeletal:  Negative for arthralgias.  Neurological:  Negative for syncope, light-headedness and headaches.  Psychiatric/Behavioral:  Negative for dysphoric mood.   Objective:  BP 100/64   Pulse 60   Temp 98.1 F (36.7 C) (Temporal)   Ht _0   (1.676 m)   Wt 133 lb 2 oz (60.4 kg)   SpO2 98%   BMI 21.49 kg/m   Wt Readings from Last 3 Encounters:  11/02/21 133 lb 2 oz (60.4 kg)  10/21/21 132 lb (59.9 kg)  10/06/21 128 lb (58.1 kg)      Physical Exam Constitutional:      Appearance: He is well-developed. He is ill-appearing.  HENT:     Head: Normocephalic.     Right Ear: Hearing normal.     Left Ear: Hearing normal.     Nose: Nose normal.  Neck:  Thyroid: No thyroid mass or thyromegaly.     Vascular: No carotid bruit.     Trachea: Trachea normal.  Cardiovascular:     Rate and Rhythm: Normal rate and regular rhythm.     Pulses: Normal pulses.     Heart sounds: Heart sounds not distant. No murmur heard.   No friction rub. No gallop.     Comments: No peripheral edema Pulmonary:     Effort: Pulmonary effort is normal. No respiratory distress.     Breath sounds: Examination of the right-lower field reveals rales. Examination of the left-lower field reveals rales. Rales present.  Skin:    General: Skin is warm and dry.     Findings: No rash.  Neurological:     Mental Status: He is oriented to person, place, and time.     Gait: Gait abnormal.  Psychiatric:        Speech: Speech normal.        Behavior: Behavior normal.        Thought Content: Thought content normal.      Results for orders placed or performed in visit on 10/26/21  Comprehensive metabolic panel  Result Value Ref Range   Sodium 129 (L) 135 - 145 mEq/L   Potassium 4.4 3.5 - 5.1 mEq/L   Chloride 98 96 - 112 mEq/L   CO2 26 19 - 32 mEq/L   Glucose, Bld 235 (H) 70 - 99 mg/dL   BUN 20 6 - 23 mg/dL   Creatinine, Ser 0.84 0.40 - 1.50 mg/dL   Total Bilirubin 0.4 0.2 - 1.2 mg/dL   Alkaline Phosphatase 91 39 - 117 U/L   AST 18 0 - 37 U/L   ALT 16 0 - 53 U/L   Total Protein 7.4 6.0 - 8.3 g/dL   Albumin 3.9 3.5 - 5.2 g/dL   GFR 81.88 >60.00 mL/min   Calcium 9.4 8.4 - 10.5 mg/dL  Lipid panel  Result Value Ref Range   Cholesterol 136 0 - 200 mg/dL    Triglycerides 212.0 (H) 0.0 - 149.0 mg/dL   HDL 40.00 >39.00 mg/dL   VLDL 42.4 (H) 0.0 - 40.0 mg/dL   Total CHOL/HDL Ratio 3    NonHDL 95.77   Hemoglobin A1c  Result Value Ref Range   Hgb A1c MFr Bld 7.1 (H) 4.6 - 6.5 %  LDL cholesterol, direct  Result Value Ref Range   Direct LDL 81.0 mg/dL    This visit occurred during the SARS-CoV-2 public health emergency.  Safety protocols were in place, including screening questions prior to the visit, additional usage of staff PPE, and extensive cleaning of exam room while observing appropriate contact time as indicated for disinfecting solutions.   COVID 19 screen:  No recent travel or known exposure to COVID19 The patient denies respiratory symptoms of COVID 19 at this time. The importance of social distancing was discussed today.   Assessment and Plan Problem List Items Addressed This Visit     Hyperlipidemia associated with type 2 diabetes mellitus (HCC) (Chronic)   Relevant Medications   glipiZIDE (GLIPIZIDE XL) 10 MG 24 hr tablet   ILD (interstitial lung disease) (HCC) (Chronic)    Ofev discontinued given undesired SE.  No further GI bleeding.      Type 2 diabetes mellitus with diabetic neuropathy, with long-term current use of insulin (HCC) - Primary (Chronic)    Stable, chronic.  Continue current medication.   On lantus 55 Units SQ BID  Stopped metformin. Glipizide 10 mg  daily      Relevant Medications   glipiZIDE (GLIPIZIDE XL) 10 MG 24 hr tablet   Current moderate episode of major depressive disorder without prior episode (HCC)    Moderate control on Remeron.. helping with appetite       Restless leg syndrome    Trial of ropinirole for restless leg: Start 1 tablet at bedtime if tolerated but not effective after 3-4 days, increase to 2 tabs then if tolerated but not effective after 3-4  more days, then  increase to 3 tabs  at bedtime, if still not effective call.       Urinary retention with incomplete bladder  emptying    Flomax is helping with urine flow  He has not had to use the catheters lately  No dysuria.      UTI due to extended-spectrum beta lactamase (ESBL) producing Escherichia coli    Eval for resolution s/p antibiotic.      Relevant Orders   POCT Urinalysis Dipstick (Automated) (Completed)   Urine Culture (Completed)   Other Visit Diagnoses     Need for influenza vaccination       Relevant Orders   Flu Vaccine QUAD High Dose(Fluad) (Completed)        Orders Placed This Encounter  Procedures   Urine Culture   Flu Vaccine QUAD High Dose(Fluad)   POCT Urinalysis Dipstick (Automated)       [name], MD

## 2021-11-02 NOTE — Patient Instructions (Signed)
Trial of ropinirole for restless leg: Start 1 tablet at bedtime if tolerated but not effective after 3-4 days, increase to 2 tabs then if tolerated but not effective after 3-4  more days, then  increase to 3 tabs  at bedtime, if still not effective call.

## 2021-11-04 DIAGNOSIS — K922 Gastrointestinal hemorrhage, unspecified: Secondary | ICD-10-CM | POA: Diagnosis not present

## 2021-11-04 DIAGNOSIS — I2510 Atherosclerotic heart disease of native coronary artery without angina pectoris: Secondary | ICD-10-CM | POA: Diagnosis not present

## 2021-11-04 DIAGNOSIS — B9620 Unspecified Escherichia coli [E. coli] as the cause of diseases classified elsewhere: Secondary | ICD-10-CM | POA: Diagnosis not present

## 2021-11-04 DIAGNOSIS — E1142 Type 2 diabetes mellitus with diabetic polyneuropathy: Secondary | ICD-10-CM | POA: Diagnosis not present

## 2021-11-04 DIAGNOSIS — I10 Essential (primary) hypertension: Secondary | ICD-10-CM | POA: Diagnosis not present

## 2021-11-04 DIAGNOSIS — J84112 Idiopathic pulmonary fibrosis: Secondary | ICD-10-CM | POA: Diagnosis not present

## 2021-11-04 DIAGNOSIS — A0472 Enterocolitis due to Clostridium difficile, not specified as recurrent: Secondary | ICD-10-CM | POA: Diagnosis not present

## 2021-11-04 DIAGNOSIS — N3000 Acute cystitis without hematuria: Secondary | ICD-10-CM | POA: Diagnosis not present

## 2021-11-04 DIAGNOSIS — D649 Anemia, unspecified: Secondary | ICD-10-CM | POA: Diagnosis not present

## 2021-11-05 LAB — URINE CULTURE
MICRO NUMBER:: 12690087
SPECIMEN QUALITY:: ADEQUATE

## 2021-11-08 DIAGNOSIS — K922 Gastrointestinal hemorrhage, unspecified: Secondary | ICD-10-CM | POA: Diagnosis not present

## 2021-11-08 DIAGNOSIS — E1142 Type 2 diabetes mellitus with diabetic polyneuropathy: Secondary | ICD-10-CM | POA: Diagnosis not present

## 2021-11-08 DIAGNOSIS — J84112 Idiopathic pulmonary fibrosis: Secondary | ICD-10-CM | POA: Diagnosis not present

## 2021-11-08 DIAGNOSIS — I2510 Atherosclerotic heart disease of native coronary artery without angina pectoris: Secondary | ICD-10-CM | POA: Diagnosis not present

## 2021-11-08 DIAGNOSIS — D649 Anemia, unspecified: Secondary | ICD-10-CM | POA: Diagnosis not present

## 2021-11-08 DIAGNOSIS — A0472 Enterocolitis due to Clostridium difficile, not specified as recurrent: Secondary | ICD-10-CM | POA: Diagnosis not present

## 2021-11-08 DIAGNOSIS — I10 Essential (primary) hypertension: Secondary | ICD-10-CM | POA: Diagnosis not present

## 2021-11-08 DIAGNOSIS — B9620 Unspecified Escherichia coli [E. coli] as the cause of diseases classified elsewhere: Secondary | ICD-10-CM | POA: Diagnosis not present

## 2021-11-08 DIAGNOSIS — N3000 Acute cystitis without hematuria: Secondary | ICD-10-CM | POA: Diagnosis not present

## 2021-11-09 DIAGNOSIS — B9620 Unspecified Escherichia coli [E. coli] as the cause of diseases classified elsewhere: Secondary | ICD-10-CM | POA: Diagnosis not present

## 2021-11-09 DIAGNOSIS — A0472 Enterocolitis due to Clostridium difficile, not specified as recurrent: Secondary | ICD-10-CM | POA: Diagnosis not present

## 2021-11-09 DIAGNOSIS — I10 Essential (primary) hypertension: Secondary | ICD-10-CM | POA: Diagnosis not present

## 2021-11-09 DIAGNOSIS — N3000 Acute cystitis without hematuria: Secondary | ICD-10-CM | POA: Diagnosis not present

## 2021-11-09 DIAGNOSIS — D649 Anemia, unspecified: Secondary | ICD-10-CM | POA: Diagnosis not present

## 2021-11-09 DIAGNOSIS — I2510 Atherosclerotic heart disease of native coronary artery without angina pectoris: Secondary | ICD-10-CM | POA: Diagnosis not present

## 2021-11-09 DIAGNOSIS — E1142 Type 2 diabetes mellitus with diabetic polyneuropathy: Secondary | ICD-10-CM | POA: Diagnosis not present

## 2021-11-09 DIAGNOSIS — J84112 Idiopathic pulmonary fibrosis: Secondary | ICD-10-CM | POA: Diagnosis not present

## 2021-11-09 DIAGNOSIS — K922 Gastrointestinal hemorrhage, unspecified: Secondary | ICD-10-CM | POA: Diagnosis not present

## 2021-11-10 ENCOUNTER — Telehealth: Payer: Self-pay | Admitting: Family Medicine

## 2021-11-10 DIAGNOSIS — A0472 Enterocolitis due to Clostridium difficile, not specified as recurrent: Secondary | ICD-10-CM | POA: Diagnosis not present

## 2021-11-10 DIAGNOSIS — N3000 Acute cystitis without hematuria: Secondary | ICD-10-CM | POA: Diagnosis not present

## 2021-11-10 DIAGNOSIS — K922 Gastrointestinal hemorrhage, unspecified: Secondary | ICD-10-CM | POA: Diagnosis not present

## 2021-11-10 DIAGNOSIS — D649 Anemia, unspecified: Secondary | ICD-10-CM | POA: Diagnosis not present

## 2021-11-10 DIAGNOSIS — B9620 Unspecified Escherichia coli [E. coli] as the cause of diseases classified elsewhere: Secondary | ICD-10-CM | POA: Diagnosis not present

## 2021-11-10 DIAGNOSIS — J84112 Idiopathic pulmonary fibrosis: Secondary | ICD-10-CM | POA: Diagnosis not present

## 2021-11-10 DIAGNOSIS — E1142 Type 2 diabetes mellitus with diabetic polyneuropathy: Secondary | ICD-10-CM | POA: Diagnosis not present

## 2021-11-10 DIAGNOSIS — I10 Essential (primary) hypertension: Secondary | ICD-10-CM | POA: Diagnosis not present

## 2021-11-10 DIAGNOSIS — I2510 Atherosclerotic heart disease of native coronary artery without angina pectoris: Secondary | ICD-10-CM | POA: Diagnosis not present

## 2021-11-10 NOTE — Telephone Encounter (Signed)
Verbal orders given to [facility] to extend PT 1 x week for 5 more weeks.

## 2021-11-10 NOTE — Telephone Encounter (Signed)
Home Health verbal orders [place] Agency Name: [facility] number: [phone]  Requesting OT/PT/Skilled nursing/Social Work/Speech:  Reason:Extended PT for 5 more weeks  Frequency:1 wk for 5 wks  Please forward to [facility] pool or providers CMA

## 2021-11-11 DIAGNOSIS — N3000 Acute cystitis without hematuria: Secondary | ICD-10-CM | POA: Diagnosis not present

## 2021-11-11 DIAGNOSIS — I2510 Atherosclerotic heart disease of native coronary artery without angina pectoris: Secondary | ICD-10-CM | POA: Diagnosis not present

## 2021-11-11 DIAGNOSIS — I10 Essential (primary) hypertension: Secondary | ICD-10-CM | POA: Diagnosis not present

## 2021-11-11 DIAGNOSIS — A0472 Enterocolitis due to Clostridium difficile, not specified as recurrent: Secondary | ICD-10-CM | POA: Diagnosis not present

## 2021-11-11 DIAGNOSIS — K922 Gastrointestinal hemorrhage, unspecified: Secondary | ICD-10-CM | POA: Diagnosis not present

## 2021-11-11 DIAGNOSIS — B9620 Unspecified Escherichia coli [E. coli] as the cause of diseases classified elsewhere: Secondary | ICD-10-CM | POA: Diagnosis not present

## 2021-11-11 DIAGNOSIS — D649 Anemia, unspecified: Secondary | ICD-10-CM | POA: Diagnosis not present

## 2021-11-11 DIAGNOSIS — J84112 Idiopathic pulmonary fibrosis: Secondary | ICD-10-CM | POA: Diagnosis not present

## 2021-11-11 DIAGNOSIS — E1142 Type 2 diabetes mellitus with diabetic polyneuropathy: Secondary | ICD-10-CM | POA: Diagnosis not present

## 2021-11-13 DIAGNOSIS — I10 Essential (primary) hypertension: Secondary | ICD-10-CM | POA: Diagnosis not present

## 2021-11-13 DIAGNOSIS — E1142 Type 2 diabetes mellitus with diabetic polyneuropathy: Secondary | ICD-10-CM | POA: Diagnosis not present

## 2021-11-13 DIAGNOSIS — A0472 Enterocolitis due to Clostridium difficile, not specified as recurrent: Secondary | ICD-10-CM | POA: Diagnosis not present

## 2021-11-13 DIAGNOSIS — N3000 Acute cystitis without hematuria: Secondary | ICD-10-CM | POA: Diagnosis not present

## 2021-11-13 DIAGNOSIS — D649 Anemia, unspecified: Secondary | ICD-10-CM | POA: Diagnosis not present

## 2021-11-13 DIAGNOSIS — B9620 Unspecified Escherichia coli [E. coli] as the cause of diseases classified elsewhere: Secondary | ICD-10-CM | POA: Diagnosis not present

## 2021-11-13 DIAGNOSIS — I2510 Atherosclerotic heart disease of native coronary artery without angina pectoris: Secondary | ICD-10-CM | POA: Diagnosis not present

## 2021-11-13 DIAGNOSIS — K922 Gastrointestinal hemorrhage, unspecified: Secondary | ICD-10-CM | POA: Diagnosis not present

## 2021-11-13 DIAGNOSIS — J84112 Idiopathic pulmonary fibrosis: Secondary | ICD-10-CM | POA: Diagnosis not present

## 2021-11-15 DIAGNOSIS — N3000 Acute cystitis without hematuria: Secondary | ICD-10-CM | POA: Diagnosis not present

## 2021-11-15 DIAGNOSIS — D649 Anemia, unspecified: Secondary | ICD-10-CM | POA: Diagnosis not present

## 2021-11-15 DIAGNOSIS — B9620 Unspecified Escherichia coli [E. coli] as the cause of diseases classified elsewhere: Secondary | ICD-10-CM | POA: Diagnosis not present

## 2021-11-15 DIAGNOSIS — A0472 Enterocolitis due to Clostridium difficile, not specified as recurrent: Secondary | ICD-10-CM | POA: Diagnosis not present

## 2021-11-15 DIAGNOSIS — J84112 Idiopathic pulmonary fibrosis: Secondary | ICD-10-CM | POA: Diagnosis not present

## 2021-11-15 DIAGNOSIS — I10 Essential (primary) hypertension: Secondary | ICD-10-CM | POA: Diagnosis not present

## 2021-11-15 DIAGNOSIS — I2510 Atherosclerotic heart disease of native coronary artery without angina pectoris: Secondary | ICD-10-CM | POA: Diagnosis not present

## 2021-11-15 DIAGNOSIS — K922 Gastrointestinal hemorrhage, unspecified: Secondary | ICD-10-CM | POA: Diagnosis not present

## 2021-11-15 DIAGNOSIS — E1142 Type 2 diabetes mellitus with diabetic polyneuropathy: Secondary | ICD-10-CM | POA: Diagnosis not present

## 2021-11-16 DIAGNOSIS — I10 Essential (primary) hypertension: Secondary | ICD-10-CM | POA: Diagnosis not present

## 2021-11-16 DIAGNOSIS — I2510 Atherosclerotic heart disease of native coronary artery without angina pectoris: Secondary | ICD-10-CM | POA: Diagnosis not present

## 2021-11-16 DIAGNOSIS — D649 Anemia, unspecified: Secondary | ICD-10-CM | POA: Diagnosis not present

## 2021-11-16 DIAGNOSIS — K922 Gastrointestinal hemorrhage, unspecified: Secondary | ICD-10-CM | POA: Diagnosis not present

## 2021-11-16 DIAGNOSIS — B9620 Unspecified Escherichia coli [E. coli] as the cause of diseases classified elsewhere: Secondary | ICD-10-CM | POA: Diagnosis not present

## 2021-11-16 DIAGNOSIS — N3000 Acute cystitis without hematuria: Secondary | ICD-10-CM | POA: Diagnosis not present

## 2021-11-16 DIAGNOSIS — J84112 Idiopathic pulmonary fibrosis: Secondary | ICD-10-CM | POA: Diagnosis not present

## 2021-11-16 DIAGNOSIS — E1142 Type 2 diabetes mellitus with diabetic polyneuropathy: Secondary | ICD-10-CM | POA: Diagnosis not present

## 2021-11-16 DIAGNOSIS — A0472 Enterocolitis due to Clostridium difficile, not specified as recurrent: Secondary | ICD-10-CM | POA: Diagnosis not present

## 2021-11-18 ENCOUNTER — Other Ambulatory Visit: Payer: Self-pay | Admitting: Family Medicine

## 2021-11-18 DIAGNOSIS — D649 Anemia, unspecified: Secondary | ICD-10-CM | POA: Diagnosis not present

## 2021-11-18 DIAGNOSIS — I10 Essential (primary) hypertension: Secondary | ICD-10-CM | POA: Diagnosis not present

## 2021-11-18 DIAGNOSIS — J84112 Idiopathic pulmonary fibrosis: Secondary | ICD-10-CM | POA: Diagnosis not present

## 2021-11-18 DIAGNOSIS — N3000 Acute cystitis without hematuria: Secondary | ICD-10-CM | POA: Diagnosis not present

## 2021-11-18 DIAGNOSIS — E1142 Type 2 diabetes mellitus with diabetic polyneuropathy: Secondary | ICD-10-CM | POA: Diagnosis not present

## 2021-11-18 DIAGNOSIS — K922 Gastrointestinal hemorrhage, unspecified: Secondary | ICD-10-CM | POA: Diagnosis not present

## 2021-11-18 DIAGNOSIS — I2510 Atherosclerotic heart disease of native coronary artery without angina pectoris: Secondary | ICD-10-CM | POA: Diagnosis not present

## 2021-11-18 DIAGNOSIS — B9620 Unspecified Escherichia coli [E. coli] as the cause of diseases classified elsewhere: Secondary | ICD-10-CM | POA: Diagnosis not present

## 2021-11-18 DIAGNOSIS — A0472 Enterocolitis due to Clostridium difficile, not specified as recurrent: Secondary | ICD-10-CM | POA: Diagnosis not present

## 2021-11-22 DIAGNOSIS — I10 Essential (primary) hypertension: Secondary | ICD-10-CM | POA: Diagnosis not present

## 2021-11-22 DIAGNOSIS — K922 Gastrointestinal hemorrhage, unspecified: Secondary | ICD-10-CM | POA: Diagnosis not present

## 2021-11-22 DIAGNOSIS — E1142 Type 2 diabetes mellitus with diabetic polyneuropathy: Secondary | ICD-10-CM | POA: Diagnosis not present

## 2021-11-22 DIAGNOSIS — I2510 Atherosclerotic heart disease of native coronary artery without angina pectoris: Secondary | ICD-10-CM | POA: Diagnosis not present

## 2021-11-22 DIAGNOSIS — B9620 Unspecified Escherichia coli [E. coli] as the cause of diseases classified elsewhere: Secondary | ICD-10-CM | POA: Diagnosis not present

## 2021-11-22 DIAGNOSIS — A0472 Enterocolitis due to Clostridium difficile, not specified as recurrent: Secondary | ICD-10-CM | POA: Diagnosis not present

## 2021-11-22 DIAGNOSIS — D649 Anemia, unspecified: Secondary | ICD-10-CM | POA: Diagnosis not present

## 2021-11-22 DIAGNOSIS — J84112 Idiopathic pulmonary fibrosis: Secondary | ICD-10-CM | POA: Diagnosis not present

## 2021-11-22 DIAGNOSIS — N3000 Acute cystitis without hematuria: Secondary | ICD-10-CM | POA: Diagnosis not present

## 2021-11-23 DIAGNOSIS — B9620 Unspecified Escherichia coli [E. coli] as the cause of diseases classified elsewhere: Secondary | ICD-10-CM | POA: Diagnosis not present

## 2021-11-23 DIAGNOSIS — D649 Anemia, unspecified: Secondary | ICD-10-CM | POA: Diagnosis not present

## 2021-11-23 DIAGNOSIS — I2510 Atherosclerotic heart disease of native coronary artery without angina pectoris: Secondary | ICD-10-CM | POA: Diagnosis not present

## 2021-11-23 DIAGNOSIS — A0472 Enterocolitis due to Clostridium difficile, not specified as recurrent: Secondary | ICD-10-CM | POA: Diagnosis not present

## 2021-11-23 DIAGNOSIS — I10 Essential (primary) hypertension: Secondary | ICD-10-CM | POA: Diagnosis not present

## 2021-11-23 DIAGNOSIS — K922 Gastrointestinal hemorrhage, unspecified: Secondary | ICD-10-CM | POA: Diagnosis not present

## 2021-11-23 DIAGNOSIS — N3000 Acute cystitis without hematuria: Secondary | ICD-10-CM | POA: Diagnosis not present

## 2021-11-23 DIAGNOSIS — J84112 Idiopathic pulmonary fibrosis: Secondary | ICD-10-CM | POA: Diagnosis not present

## 2021-11-23 DIAGNOSIS — E1142 Type 2 diabetes mellitus with diabetic polyneuropathy: Secondary | ICD-10-CM | POA: Diagnosis not present

## 2021-11-25 DIAGNOSIS — I2510 Atherosclerotic heart disease of native coronary artery without angina pectoris: Secondary | ICD-10-CM | POA: Diagnosis not present

## 2021-11-25 DIAGNOSIS — K922 Gastrointestinal hemorrhage, unspecified: Secondary | ICD-10-CM | POA: Diagnosis not present

## 2021-11-25 DIAGNOSIS — N3000 Acute cystitis without hematuria: Secondary | ICD-10-CM | POA: Diagnosis not present

## 2021-11-25 DIAGNOSIS — J84112 Idiopathic pulmonary fibrosis: Secondary | ICD-10-CM | POA: Diagnosis not present

## 2021-11-25 DIAGNOSIS — B9620 Unspecified Escherichia coli [E. coli] as the cause of diseases classified elsewhere: Secondary | ICD-10-CM | POA: Diagnosis not present

## 2021-11-25 DIAGNOSIS — I10 Essential (primary) hypertension: Secondary | ICD-10-CM | POA: Diagnosis not present

## 2021-11-25 DIAGNOSIS — E1142 Type 2 diabetes mellitus with diabetic polyneuropathy: Secondary | ICD-10-CM | POA: Diagnosis not present

## 2021-11-25 DIAGNOSIS — A0472 Enterocolitis due to Clostridium difficile, not specified as recurrent: Secondary | ICD-10-CM | POA: Diagnosis not present

## 2021-11-25 DIAGNOSIS — D649 Anemia, unspecified: Secondary | ICD-10-CM | POA: Diagnosis not present

## 2021-11-30 DIAGNOSIS — I2510 Atherosclerotic heart disease of native coronary artery without angina pectoris: Secondary | ICD-10-CM | POA: Diagnosis not present

## 2021-11-30 DIAGNOSIS — E1142 Type 2 diabetes mellitus with diabetic polyneuropathy: Secondary | ICD-10-CM | POA: Diagnosis not present

## 2021-11-30 DIAGNOSIS — I10 Essential (primary) hypertension: Secondary | ICD-10-CM | POA: Diagnosis not present

## 2021-11-30 DIAGNOSIS — D649 Anemia, unspecified: Secondary | ICD-10-CM | POA: Diagnosis not present

## 2021-11-30 DIAGNOSIS — J84112 Idiopathic pulmonary fibrosis: Secondary | ICD-10-CM | POA: Diagnosis not present

## 2021-11-30 DIAGNOSIS — A0472 Enterocolitis due to Clostridium difficile, not specified as recurrent: Secondary | ICD-10-CM | POA: Diagnosis not present

## 2021-11-30 DIAGNOSIS — N3000 Acute cystitis without hematuria: Secondary | ICD-10-CM | POA: Diagnosis not present

## 2021-11-30 DIAGNOSIS — K922 Gastrointestinal hemorrhage, unspecified: Secondary | ICD-10-CM | POA: Diagnosis not present

## 2021-11-30 DIAGNOSIS — B9620 Unspecified Escherichia coli [E. coli] as the cause of diseases classified elsewhere: Secondary | ICD-10-CM | POA: Diagnosis not present

## 2021-12-02 DIAGNOSIS — A0472 Enterocolitis due to Clostridium difficile, not specified as recurrent: Secondary | ICD-10-CM | POA: Diagnosis not present

## 2021-12-02 DIAGNOSIS — I10 Essential (primary) hypertension: Secondary | ICD-10-CM | POA: Diagnosis not present

## 2021-12-02 DIAGNOSIS — J84112 Idiopathic pulmonary fibrosis: Secondary | ICD-10-CM | POA: Diagnosis not present

## 2021-12-02 DIAGNOSIS — E1142 Type 2 diabetes mellitus with diabetic polyneuropathy: Secondary | ICD-10-CM | POA: Diagnosis not present

## 2021-12-02 DIAGNOSIS — K922 Gastrointestinal hemorrhage, unspecified: Secondary | ICD-10-CM | POA: Diagnosis not present

## 2021-12-02 DIAGNOSIS — I2510 Atherosclerotic heart disease of native coronary artery without angina pectoris: Secondary | ICD-10-CM | POA: Diagnosis not present

## 2021-12-02 DIAGNOSIS — D649 Anemia, unspecified: Secondary | ICD-10-CM | POA: Diagnosis not present

## 2021-12-02 DIAGNOSIS — N3000 Acute cystitis without hematuria: Secondary | ICD-10-CM | POA: Diagnosis not present

## 2021-12-02 DIAGNOSIS — B9620 Unspecified Escherichia coli [E. coli] as the cause of diseases classified elsewhere: Secondary | ICD-10-CM | POA: Diagnosis not present

## 2021-12-07 ENCOUNTER — Other Ambulatory Visit: Payer: Self-pay

## 2021-12-07 ENCOUNTER — Encounter: Payer: Self-pay | Admitting: Pulmonary Disease

## 2021-12-07 ENCOUNTER — Telehealth: Payer: Self-pay | Admitting: Pulmonary Disease

## 2021-12-07 ENCOUNTER — Other Ambulatory Visit: Payer: Self-pay | Admitting: Family Medicine

## 2021-12-07 ENCOUNTER — Ambulatory Visit (INDEPENDENT_AMBULATORY_CARE_PROVIDER_SITE_OTHER): Payer: Medicare HMO | Admitting: Pulmonary Disease

## 2021-12-07 ENCOUNTER — Other Ambulatory Visit: Payer: Self-pay | Admitting: Pulmonary Disease

## 2021-12-07 VITALS — BP 116/80 | HR 75 | Temp 97.1°F | Ht 66.0 in | Wt 134.8 lb

## 2021-12-07 DIAGNOSIS — J84112 Idiopathic pulmonary fibrosis: Secondary | ICD-10-CM

## 2021-12-07 DIAGNOSIS — R053 Chronic cough: Secondary | ICD-10-CM

## 2021-12-07 DIAGNOSIS — J9611 Chronic respiratory failure with hypoxia: Secondary | ICD-10-CM

## 2021-12-07 DIAGNOSIS — I10 Essential (primary) hypertension: Secondary | ICD-10-CM | POA: Diagnosis not present

## 2021-12-07 DIAGNOSIS — I272 Pulmonary hypertension, unspecified: Secondary | ICD-10-CM

## 2021-12-07 DIAGNOSIS — N3000 Acute cystitis without hematuria: Secondary | ICD-10-CM | POA: Diagnosis not present

## 2021-12-07 DIAGNOSIS — E1142 Type 2 diabetes mellitus with diabetic polyneuropathy: Secondary | ICD-10-CM | POA: Diagnosis not present

## 2021-12-07 DIAGNOSIS — B9620 Unspecified Escherichia coli [E. coli] as the cause of diseases classified elsewhere: Secondary | ICD-10-CM | POA: Diagnosis not present

## 2021-12-07 DIAGNOSIS — I2510 Atherosclerotic heart disease of native coronary artery without angina pectoris: Secondary | ICD-10-CM | POA: Diagnosis not present

## 2021-12-07 DIAGNOSIS — J449 Chronic obstructive pulmonary disease, unspecified: Secondary | ICD-10-CM | POA: Diagnosis not present

## 2021-12-07 DIAGNOSIS — Z7189 Other specified counseling: Secondary | ICD-10-CM | POA: Diagnosis not present

## 2021-12-07 DIAGNOSIS — D649 Anemia, unspecified: Secondary | ICD-10-CM | POA: Diagnosis not present

## 2021-12-07 DIAGNOSIS — I340 Nonrheumatic mitral (valve) insufficiency: Secondary | ICD-10-CM | POA: Diagnosis not present

## 2021-12-07 DIAGNOSIS — K922 Gastrointestinal hemorrhage, unspecified: Secondary | ICD-10-CM | POA: Diagnosis not present

## 2021-12-07 DIAGNOSIS — A0472 Enterocolitis due to Clostridium difficile, not specified as recurrent: Secondary | ICD-10-CM | POA: Diagnosis not present

## 2021-12-07 DIAGNOSIS — R0609 Other forms of dyspnea: Secondary | ICD-10-CM

## 2021-12-07 MED ORDER — BENZONATATE 200 MG PO CAPS: 200.0000 mg | ORAL_CAPSULE | Freq: Three times a day (TID) | ORAL | 1 refills | Status: DC | PRN

## 2021-12-07 NOTE — Telephone Encounter (Signed)
Spoke to patient's spouse, [name](DPR). Brian Bass is requesting a call from Dr. Patsey Berthold directly to discuss ofev. She feels that ofev was effective, as his cough was much better. She stated that it was determined during his admission that ofev was not the cause of the internal bleeding. She does not feel that patient's liver function has ever been abnormal. She also stated that he was prescribed a medication to help with his appetite and that seems to be helping. She is concerned about his cough and would like something to help with it. Patient has tried OTC cough meds without relief.  Dr. Patsey Berthold, please advise. Thanks

## 2021-12-07 NOTE — Progress Notes (Signed)
Subjective:    Patient ID: Brian Bass, male    DOB: 1940-12-13, 81 y.o.   MRN: [id number] Chief Complaint  Patient presents with   Follow-up    IPF    HPI Patient is a 81 year old lifelong never smoker with IPF and severe pulmonary hypertension who follows here for the same.  This is a scheduled visit.  He was last seen on 21 October 2021.  He has continued to become more and more debilitated.  Dyspnea classification WHO III-IV.  He presents today with his daughter.  He had Ofev discontinued due to continued issues with side effects from the medication to include LFT abnormalities, weight loss and anorexia and bleeding.  He had actually had to decrease the dose of Ofev to subtherapeutic doses due to intolerance of the medication.  However his wife insisted that he needed to keep taking the medication.  He really has not had any significant impact from Ofev.  At this point I believe that resuming Ofev would cause more difficulties with side effects than provide any therapeutic benefit.  He has had issues with chronic cough which were controlled previously with gabapentin and Breztri.  His cough has now gotten worse and he feels that maybe it was because he discontinued Ofev.  However in the interim he has also discontinued gabapentin presumably because it was making him sleepy.  His participation in activities of daily living is becoming more more compromised.  He is more compliant with oxygen supplementation which he has been advised to continue due to his severe pulmonary hypertension which has worsened.  At his prior visit we did discuss consideration for palliative services.  It was difficult conversation and the patient and his wife who was present with him at that time would not engage in the discussion.  Today the patient, as noted, presents with his daughter, she is more open to her father having palliative care.  He is apparently getting home health.  Apparently the home situation is  very difficult with the patient and his wife not getting along this has been a longstanding issue but has become worse as he has become more debilitated.  DATA: Serology 02/27/18>> ACE, rheumatoid factor, ANA negative. PFTs 02/27/18:FVC is 63% predicted, FEV1 of 70% predicted, ratio 86% predicted.  There is no significant change with bronchodilator therapy.  Flow volume loop is consistent with restriction.DLCO is 54% predicted, DLCO is reduced at 56%. CT high-resolution 02/27/18>> the bibasilar fibrotic changes have advanced significantly in the subpleural areas of traction bronchiectasis changes have worsened.  Right paratracheal lymphadenopathy minimally changed. Chest x-ray 02/08/18; diffuse groundglass and interstitial changes throughout both lungs, right diaphragm is elevated.  Interstitial changes has progressed significantly since previous chest x-ray on 09/06/13.  CT chest 10/21/13, bilateral emphysematous changes, bilateral subpleural reticular changes consistent with pulmonary fibrosis.  Chemistry 01/23/2019>> AST, ALT normal. Overnight oximetry 10/08/2018>> performed on room air; time less than or equal to 88% was 0.7 minutes.  Oxygen was not required. ONOX 11/13/2019 no significant desaturations though cyclical desats to 20% were noted. CT chest high resolution 02/05/2020 persistent severe fibrotic changes unchanged from 3/19 however significantly progressed from 2014. 2D echo 02/05/2020 LVEF 60 to 65%, moderate pulmonary hypertension, trivial MR CT chest high-resolution 03/16/2021: ILD compatible with UIP with very minimal to no progression from prior. PFTs 04/13/2021: Restrictive physiology of moderate degree, FEV1 1.38 L or 58% predicted, FVC 1.82 L or 53% predicted.  FEV1/FVC 76%, no bronchodilator response.  Diffusion capacity moderately impaired.  2D echo 08/12/2021: LVEF 55 to 60%, mild LVH, grade 1 DD, moderate to severe mitral valve regurgitation, severe pulmonary hypertension.  Since prior 2D  echo pulmonary hypertension has worsened and mitral regurgitation has worsened.    Review of Systems A 10 point review of systems was performed and it is as noted above otherwise negative.  Patient Active Problem List   Diagnosis Date Noted   Mitral regurgitation 12/09/2021   Restless leg syndrome 11/02/2021   UTI due to extended-spectrum beta lactamase (ESBL) producing Escherichia coli 11/02/2021   Chronic diarrhea 06/14/2021   Abnormal CT of liver 04/02/2021   Urinary retention 04/02/2021   Urinary retention with incomplete bladder emptying 01/19/2021   Joint pain 09/09/2020   Atherosclerosis of native coronary artery of native heart with stable angina pectoris ([place]) 03/23/2019   Elevated PSA 01/29/2019   Diabetic polyneuropathy associated with type 2 diabetes mellitus ([place]) 01/29/2019   Positive cardiac stress test 03/06/2018   DOE (dyspnea on exertion) 02/08/2018   Benign prostatic hyperplasia with nocturia 02/08/2018   Counseling regarding end of life decision making 07/14/2015   GERD (gastroesophageal reflux disease) 12/13/2013   ILD (interstitial lung disease) ([place]) 11/08/2013   CKD (chronic kidney disease) stage 2, GFR 60-89 ml/min 09/06/2013   Idiopathic gout of multiple sites 12/03/2009   CAD (coronary artery disease) 11/02/2009   Type 2 diabetes mellitus with diabetic neuropathy, with long-term current use of insulin ([place]) 08/26/2009   Hyperlipidemia associated with type 2 diabetes mellitus ([place]) 08/26/2009   Hypertension associated with diabetes ([place]) 08/26/2009   OSTEOARTHRITIS, MULTIPLE JOINTS 08/26/2009   ESOPHAGEAL STRICTURE 10/14/2008   IRRITABLE BOWEL SYNDROME 09/02/2008   PERSONAL HX COLONIC POLYPS 09/02/2008   Social History   Tobacco Use   Smoking status: Never   Smokeless tobacco: Never  Substance Use Topics   Alcohol use: No   Current Meds  Medication Sig   ACCU-CHEK FASTCLIX LANCETS MISC 1 Device by Other route 3 (three) times daily. Check blood  sugars 2 to 3 times daily.  Dx: 250.02   ACCU-CHEK SMARTVIEW test strip TEST BLOOD SUGAR 2 TO 3 TIMES DAILY   Blood Glucose Monitoring Suppl (ACCU-CHEK NANO SMARTVIEW) W/DEVICE KIT 1 kit by Other route 3 (three) times daily. Use to check blood sugars 2 to 3 times daily.  Dx: 250.02   Budeson-Glycopyrrol-Formoterol (BREZTRI AEROSPHERE) 160-9-4.8 MCG/ACT AERO Inhale 2 puffs into the lungs in the morning and at bedtime.   ezetimibe (ZETIA) 10 MG tablet TAKE 1 TABLET BY MOUTH DAILY   fosfomycin (MONUROL) 3 g PACK    gabapentin (NEURONTIN) 300 MG capsule TAKE ONE CAPSULE AT BEDTIME   glipiZIDE (GLIPIZIDE XL) 10 MG 24 hr tablet Take 1 tablet (10 mg total) by mouth daily with breakfast.   Insulin Syringe-Needle U-100 (DROPLET INSULIN SYRINGE) 31G X 5/16" 0.3 ML MISC USE TO INJECT LANTUS INSULIN 2 TIMES DAILY   LANTUS 100 UNIT/ML injection INJECT 55 UNITS SQ TWICE A DAY   meloxicam (MOBIC) 7.5 MG tablet Take 1 tablet (7.5 mg total) by mouth daily.   mirtazapine (REMERON) 15 MG tablet TAKE 1 TABLET BY MOUTH AT BEDTIME   MITIGARE 0.6 MG CAPS FOR FLARE TAKE 2 CAPSULES ONCE THEN TAKE1 CAPSULE IN 2 HOURS IF PAIN CONTINUES, THEN TAKE 1 CAPSULE DAILY   Multiple Vitamin (MULTIVITAMIN) tablet Take 1 tablet by mouth daily.   omeprazole (PRILOSEC) 20 MG capsule Take 1 capsule (20 mg total) by mouth 2 (two) times daily before a meal.   ondansetron (  ZOFRAN) 4 MG tablet Take 1 tablet (4 mg total) by mouth 3 (three) times daily as needed for nausea or vomiting. TAKE ONE TABLET BY MOUTH EVERY DAY AS NEEDED FOR NAUSEA   rOPINIRole (REQUIP) 0.25 MG tablet 1 tablet at bedtime if tolerated but not effective after 3-4 days, increase to 2 tabs then if tolerated but not effective after 3-4  more days, then  increase to 3 tabs  at bedtime, if still not effective call.   saccharomyces boulardii (FLORASTOR) 250 MG capsule Take 1 capsule (250 mg total) by mouth 2 (two) times daily.   tamsulosin (FLOMAX) 0.4 MG CAPS capsule TAKE 1  CAPSULE EVERY DAY IF NOT IMPROVING INCREASE TO TAKE 2 CAPSULES EVERY DAY (Patient taking differently: TAKE 2 CAPSULES EVERY DAY)   Immunization History  Administered Date(s) Administered   Fluad Quad(high Dose 65+) 08/08/2019, 09/30/2020, 11/02/2021   Influenza Split 10/05/2011, 10/11/2012   Influenza Whole 11/12/2010   Influenza,inj,Quad PF,6+ Mos 09/06/2013, 09/24/2014, 10/02/2015, 09/13/2016, 09/29/2017, 08/24/2018   PFIZER(Purple Top)SARS-COV-2 Vaccination 01/22/2020, 02/12/2020   Pneumococcal Conjugate-13 01/12/2018   Pneumococcal Polysaccharide-23 12/06/2007   Td 08/10/2010, 04/10/2020       Objective:   Physical Exam BP 116/80 (BP Location: Left Arm, Patient Position: Sitting, Cuff Size: Normal)    Pulse 75    Temp (!) 97.1 F (36.2 C) (Oral)    Ht _0  (1.676 m)    Wt 134 lb 12.8 oz (61.1 kg)    SpO2 100%    BMI 21.76 kg/m  GENERAL:  Thin, very debilitated and chronically ill-appearing, awake, there is conversational dyspnea.  Presented in electric scooter due to severe limiting dyspnea.  Tearful during examination and interview. HEAD: Normocephalic, atraumatic.  EYES: Pupils equal, round, reactive to light.  No scleral icterus.  MOUTH: Nose/mouth/throat not examined due to masking requirements for COVID 19. NECK: Supple. No thyromegaly. Trachea midline. No JVD.  No adenopathy. PULMONARY: Good air entry bilaterally.   Mild Velcro crackles at bases otherwise, no adventitious sounds. CARDIOVASCULAR: S1 and S2. Regular rate and rhythm.  Grade 8-7/5 systolic ejection murmur at the left sternal border, no gallop.  No rubs. ABDOMEN:.  Benign. MUSCULOSKELETAL: No joint deformity, no clubbing, no edema.  NEUROLOGIC: No overt focal deficits.  Gait not tested.  Speech is fluent. SKIN: Intact,warm,dry.  On limited exam no rashes or lesions. PSYCH: Mood depressed, tearful.      Assessment & Plan:     ICD-10-CM   1. IPF (idiopathic pulmonary fibrosis) ([place])  I43.329    Patient is  end-stage in this regard Commend palliative/hospice services    2. Chronic respiratory failure with hypoxia (HCC)  J96.11    Continue oxygen at 2 L/min    3. Severe pulmonary hypertension (HCC) - Group 3H  I27.20    WHO Class III- IV Multifactorial: IPF, mitral disease, diastolic dysfunction    4. Nonrheumatic mitral valve regurgitation  I34.0    Appears to have worsened Patient not surgical candidate    5. Chronic cough  R05.3    Likely due to IPF This will be hard to control Resume gabapentin Tessalon as needed    6. Counseling regarding end of life decision making  Z71.89    Discussed end-of-life planning Patient willing to consider palliative/hospice care      Meds ordered this encounter  Medications   benzonatate (TESSALON) 200 MG capsule    Sig: Take 1 capsule (200 mg total) by mouth 3 (three) times daily as needed for cough.  Dispense:  40 capsule    Refill:  1   We will see the patient in 4 to 6 weeks time he is to contact us prior to that time should any new difficulties arise.  Renold Don, MD Advanced Bronchoscopy PCCM [facility]    *This note was dictated using voice recognition software/Dragon.  Despite best efforts to proofread, errors can occur which can change the meaning. Any transcriptional errors that result from this process are unintentional and may not be fully corrected at the time of dictation.

## 2021-12-07 NOTE — Telephone Encounter (Signed)
Noted by triage.  Will close encounter.

## 2021-12-07 NOTE — Patient Instructions (Signed)
At this point I think the Ofev was causing more side effects than benefits.  It did cause issues with your liver, with your appetite and potentially with your bleeding issues.  Your shortness of breath now is more related to the pressure going from the artery that leads to blood from the heart to the lungs.  This artery is under quite a bit of pressure.  The best thing for this right now is to keep wearing her oxygen.  We will see you in follow-up in 4 to 6 weeks time.  I will be happy to provide palliative care orders to the [facility] team if they have a palliative care program.  If not we can find a palliative care program for you.

## 2021-12-07 NOTE — Telephone Encounter (Signed)
I spoke with patient's wife and explained the reasoning behind withholding Ofev.  Patient is now in need of palliative/hospice care.  This is being investigated currently.  Will send Rx for Tessalon as needed for cough.

## 2021-12-09 ENCOUNTER — Telehealth: Payer: Self-pay | Admitting: Pulmonary Disease

## 2021-12-09 ENCOUNTER — Encounter: Payer: Self-pay | Admitting: Pulmonary Disease

## 2021-12-09 DIAGNOSIS — J84112 Idiopathic pulmonary fibrosis: Secondary | ICD-10-CM | POA: Diagnosis not present

## 2021-12-09 DIAGNOSIS — I2510 Atherosclerotic heart disease of native coronary artery without angina pectoris: Secondary | ICD-10-CM | POA: Diagnosis not present

## 2021-12-09 DIAGNOSIS — B9620 Unspecified Escherichia coli [E. coli] as the cause of diseases classified elsewhere: Secondary | ICD-10-CM | POA: Diagnosis not present

## 2021-12-09 DIAGNOSIS — I340 Nonrheumatic mitral (valve) insufficiency: Secondary | ICD-10-CM | POA: Insufficient documentation

## 2021-12-09 DIAGNOSIS — A0472 Enterocolitis due to Clostridium difficile, not specified as recurrent: Secondary | ICD-10-CM | POA: Diagnosis not present

## 2021-12-09 DIAGNOSIS — I10 Essential (primary) hypertension: Secondary | ICD-10-CM | POA: Diagnosis not present

## 2021-12-09 DIAGNOSIS — K922 Gastrointestinal hemorrhage, unspecified: Secondary | ICD-10-CM | POA: Diagnosis not present

## 2021-12-09 DIAGNOSIS — E1142 Type 2 diabetes mellitus with diabetic polyneuropathy: Secondary | ICD-10-CM | POA: Diagnosis not present

## 2021-12-09 DIAGNOSIS — N3000 Acute cystitis without hematuria: Secondary | ICD-10-CM | POA: Diagnosis not present

## 2021-12-09 DIAGNOSIS — D649 Anemia, unspecified: Secondary | ICD-10-CM | POA: Diagnosis not present

## 2021-12-09 NOTE — Telephone Encounter (Signed)
Received verbal from patient to speak to daughter, Curt Bears. Curt Bears stated that patient would like to proceed with palliative care thru [facility].  Referral placed. Nothing further needed.

## 2021-12-10 DIAGNOSIS — I10 Essential (primary) hypertension: Secondary | ICD-10-CM | POA: Diagnosis not present

## 2021-12-10 DIAGNOSIS — K922 Gastrointestinal hemorrhage, unspecified: Secondary | ICD-10-CM | POA: Diagnosis not present

## 2021-12-10 DIAGNOSIS — N3000 Acute cystitis without hematuria: Secondary | ICD-10-CM | POA: Diagnosis not present

## 2021-12-10 DIAGNOSIS — A0472 Enterocolitis due to Clostridium difficile, not specified as recurrent: Secondary | ICD-10-CM | POA: Diagnosis not present

## 2021-12-10 DIAGNOSIS — F325 Major depressive disorder, single episode, in full remission: Secondary | ICD-10-CM | POA: Insufficient documentation

## 2021-12-10 DIAGNOSIS — B9620 Unspecified Escherichia coli [E. coli] as the cause of diseases classified elsewhere: Secondary | ICD-10-CM | POA: Diagnosis not present

## 2021-12-10 DIAGNOSIS — J84112 Idiopathic pulmonary fibrosis: Secondary | ICD-10-CM | POA: Diagnosis not present

## 2021-12-10 DIAGNOSIS — D649 Anemia, unspecified: Secondary | ICD-10-CM | POA: Diagnosis not present

## 2021-12-10 DIAGNOSIS — I2510 Atherosclerotic heart disease of native coronary artery without angina pectoris: Secondary | ICD-10-CM | POA: Diagnosis not present

## 2021-12-10 DIAGNOSIS — F321 Major depressive disorder, single episode, moderate: Secondary | ICD-10-CM | POA: Insufficient documentation

## 2021-12-10 DIAGNOSIS — E1142 Type 2 diabetes mellitus with diabetic polyneuropathy: Secondary | ICD-10-CM | POA: Diagnosis not present

## 2021-12-10 NOTE — Assessment & Plan Note (Signed)
Stable, chronic.  Continue current medication.   On lantus 55 Units SQ BID  Stopped metformin. Glipizide 10 mg daily

## 2021-12-10 NOTE — Assessment & Plan Note (Signed)
Flomax is helping with urine flow  He has not had to use the catheters lately  No dysuria.

## 2021-12-10 NOTE — Assessment & Plan Note (Signed)
Ofev discontinued given undesired SE.  No further GI bleeding.

## 2021-12-10 NOTE — Assessment & Plan Note (Signed)
Moderate control on Remeron.. helping with appetite

## 2021-12-10 NOTE — Assessment & Plan Note (Signed)
Eval for resolution s/p antibiotic.

## 2021-12-10 NOTE — Assessment & Plan Note (Signed)
Trial of ropinirole for restless leg: Start 1 tablet at bedtime if tolerated but not effective after 3-4 days, increase to 2 tabs then if tolerated but not effective after 3-4  more days, then  increase to 3 tabs  at bedtime, if still not effective call.

## 2021-12-13 DIAGNOSIS — N3000 Acute cystitis without hematuria: Secondary | ICD-10-CM | POA: Diagnosis not present

## 2021-12-13 DIAGNOSIS — I2510 Atherosclerotic heart disease of native coronary artery without angina pectoris: Secondary | ICD-10-CM | POA: Diagnosis not present

## 2021-12-13 DIAGNOSIS — J84112 Idiopathic pulmonary fibrosis: Secondary | ICD-10-CM | POA: Diagnosis not present

## 2021-12-13 DIAGNOSIS — B9620 Unspecified Escherichia coli [E. coli] as the cause of diseases classified elsewhere: Secondary | ICD-10-CM | POA: Diagnosis not present

## 2021-12-13 DIAGNOSIS — E1142 Type 2 diabetes mellitus with diabetic polyneuropathy: Secondary | ICD-10-CM | POA: Diagnosis not present

## 2021-12-13 DIAGNOSIS — I10 Essential (primary) hypertension: Secondary | ICD-10-CM | POA: Diagnosis not present

## 2021-12-13 DIAGNOSIS — K922 Gastrointestinal hemorrhage, unspecified: Secondary | ICD-10-CM | POA: Diagnosis not present

## 2021-12-13 DIAGNOSIS — A0472 Enterocolitis due to Clostridium difficile, not specified as recurrent: Secondary | ICD-10-CM | POA: Diagnosis not present

## 2021-12-13 DIAGNOSIS — D649 Anemia, unspecified: Secondary | ICD-10-CM | POA: Diagnosis not present

## 2021-12-14 DIAGNOSIS — D649 Anemia, unspecified: Secondary | ICD-10-CM | POA: Diagnosis not present

## 2021-12-14 DIAGNOSIS — A0472 Enterocolitis due to Clostridium difficile, not specified as recurrent: Secondary | ICD-10-CM | POA: Diagnosis not present

## 2021-12-14 DIAGNOSIS — B9620 Unspecified Escherichia coli [E. coli] as the cause of diseases classified elsewhere: Secondary | ICD-10-CM | POA: Diagnosis not present

## 2021-12-14 DIAGNOSIS — I10 Essential (primary) hypertension: Secondary | ICD-10-CM | POA: Diagnosis not present

## 2021-12-14 DIAGNOSIS — K922 Gastrointestinal hemorrhage, unspecified: Secondary | ICD-10-CM | POA: Diagnosis not present

## 2021-12-14 DIAGNOSIS — I2510 Atherosclerotic heart disease of native coronary artery without angina pectoris: Secondary | ICD-10-CM | POA: Diagnosis not present

## 2021-12-14 DIAGNOSIS — E1142 Type 2 diabetes mellitus with diabetic polyneuropathy: Secondary | ICD-10-CM | POA: Diagnosis not present

## 2021-12-14 DIAGNOSIS — N3000 Acute cystitis without hematuria: Secondary | ICD-10-CM | POA: Diagnosis not present

## 2021-12-14 DIAGNOSIS — J84112 Idiopathic pulmonary fibrosis: Secondary | ICD-10-CM | POA: Diagnosis not present

## 2021-12-16 ENCOUNTER — Telehealth: Payer: Self-pay | Admitting: Nurse Practitioner

## 2021-12-16 ENCOUNTER — Telehealth: Payer: Self-pay | Admitting: Pulmonary Disease

## 2021-12-16 DIAGNOSIS — D649 Anemia, unspecified: Secondary | ICD-10-CM | POA: Diagnosis not present

## 2021-12-16 DIAGNOSIS — I2510 Atherosclerotic heart disease of native coronary artery without angina pectoris: Secondary | ICD-10-CM | POA: Diagnosis not present

## 2021-12-16 DIAGNOSIS — J84112 Idiopathic pulmonary fibrosis: Secondary | ICD-10-CM | POA: Diagnosis not present

## 2021-12-16 DIAGNOSIS — A0472 Enterocolitis due to Clostridium difficile, not specified as recurrent: Secondary | ICD-10-CM | POA: Diagnosis not present

## 2021-12-16 DIAGNOSIS — E1142 Type 2 diabetes mellitus with diabetic polyneuropathy: Secondary | ICD-10-CM | POA: Diagnosis not present

## 2021-12-16 DIAGNOSIS — N3000 Acute cystitis without hematuria: Secondary | ICD-10-CM | POA: Diagnosis not present

## 2021-12-16 DIAGNOSIS — B9620 Unspecified Escherichia coli [E. coli] as the cause of diseases classified elsewhere: Secondary | ICD-10-CM | POA: Diagnosis not present

## 2021-12-16 DIAGNOSIS — I10 Essential (primary) hypertension: Secondary | ICD-10-CM | POA: Diagnosis not present

## 2021-12-16 DIAGNOSIS — K922 Gastrointestinal hemorrhage, unspecified: Secondary | ICD-10-CM | POA: Diagnosis not present

## 2021-12-16 NOTE — Telephone Encounter (Signed)
Spoke with patient's wife [name], regarding the Palliative referral/services and she was in agreement with scheduling visit.  I have scheduled an In-home Consult for 01/04/22 @ 9 AM

## 2021-12-16 NOTE — Telephone Encounter (Signed)
Spoke to patient's spouse, [name](DPR). [name] is requesting a update on palliative care. Order was placed 12/10/2020 but it looks like it is still pending.   Rodena Piety, can you help with this?

## 2021-12-17 ENCOUNTER — Telehealth: Payer: Self-pay | Admitting: Family Medicine

## 2021-12-17 NOTE — Telephone Encounter (Signed)
Called. Left message. Told her she can send me a note on MyChart or I will call back on Tuesday.

## 2021-12-17 NOTE — Telephone Encounter (Signed)
Pt daughter called wanting to discuss the well being of her father. His wife is very unstable at this moment, she stated that they did remove a gun from the house last night for everyone's well being. They only wanted to discuss this with Dr. Diona Browner to know what the next steps needed to be    Please call [phone] Curt Bears is the HPOA

## 2021-12-17 NOTE — Telephone Encounter (Signed)
Called.. left voice mail that I will try again in 30 min.

## 2021-12-17 NOTE — Telephone Encounter (Signed)
I have spoke with Wendie Simmer with [facility] and she confirmed that they did get this referral and they have spoke with the wife and consult is scheduled for 01/04/2022

## 2021-12-21 DIAGNOSIS — N3000 Acute cystitis without hematuria: Secondary | ICD-10-CM | POA: Diagnosis not present

## 2021-12-21 DIAGNOSIS — K922 Gastrointestinal hemorrhage, unspecified: Secondary | ICD-10-CM | POA: Diagnosis not present

## 2021-12-21 DIAGNOSIS — J84112 Idiopathic pulmonary fibrosis: Secondary | ICD-10-CM | POA: Diagnosis not present

## 2021-12-21 DIAGNOSIS — I2510 Atherosclerotic heart disease of native coronary artery without angina pectoris: Secondary | ICD-10-CM | POA: Diagnosis not present

## 2021-12-21 DIAGNOSIS — A0472 Enterocolitis due to Clostridium difficile, not specified as recurrent: Secondary | ICD-10-CM | POA: Diagnosis not present

## 2021-12-21 DIAGNOSIS — E1142 Type 2 diabetes mellitus with diabetic polyneuropathy: Secondary | ICD-10-CM | POA: Diagnosis not present

## 2021-12-21 DIAGNOSIS — D649 Anemia, unspecified: Secondary | ICD-10-CM | POA: Diagnosis not present

## 2021-12-21 DIAGNOSIS — I10 Essential (primary) hypertension: Secondary | ICD-10-CM | POA: Diagnosis not present

## 2021-12-21 DIAGNOSIS — B9620 Unspecified Escherichia coli [E. coli] as the cause of diseases classified elsewhere: Secondary | ICD-10-CM | POA: Diagnosis not present

## 2021-12-21 NOTE — Telephone Encounter (Signed)
Called and spoke with Curt Bears.  She has made an appointment for her father this Thursday to discuss the situation at the household and she would like to review his medication. She is also concerned he may have a UTI again and she would like to review  a urine sample at that time.  She reports she feel her father is safe at this time and there is a friend staying in her father's basement that can check on him since she is not allowed to return to his house while his wife is there. The wife and Mr Mcfadyen are planning on separating.

## 2021-12-21 NOTE — Telephone Encounter (Signed)
Pt daughter [name] called back returning a call

## 2021-12-22 DIAGNOSIS — A0472 Enterocolitis due to Clostridium difficile, not specified as recurrent: Secondary | ICD-10-CM | POA: Diagnosis not present

## 2021-12-22 DIAGNOSIS — K922 Gastrointestinal hemorrhage, unspecified: Secondary | ICD-10-CM | POA: Diagnosis not present

## 2021-12-22 DIAGNOSIS — B9620 Unspecified Escherichia coli [E. coli] as the cause of diseases classified elsewhere: Secondary | ICD-10-CM | POA: Diagnosis not present

## 2021-12-22 DIAGNOSIS — E1142 Type 2 diabetes mellitus with diabetic polyneuropathy: Secondary | ICD-10-CM | POA: Diagnosis not present

## 2021-12-22 DIAGNOSIS — D649 Anemia, unspecified: Secondary | ICD-10-CM | POA: Diagnosis not present

## 2021-12-22 DIAGNOSIS — I10 Essential (primary) hypertension: Secondary | ICD-10-CM | POA: Diagnosis not present

## 2021-12-22 DIAGNOSIS — I2510 Atherosclerotic heart disease of native coronary artery without angina pectoris: Secondary | ICD-10-CM | POA: Diagnosis not present

## 2021-12-22 DIAGNOSIS — J84112 Idiopathic pulmonary fibrosis: Secondary | ICD-10-CM | POA: Diagnosis not present

## 2021-12-22 DIAGNOSIS — N3000 Acute cystitis without hematuria: Secondary | ICD-10-CM | POA: Diagnosis not present

## 2021-12-23 ENCOUNTER — Encounter: Payer: Self-pay | Admitting: Family Medicine

## 2021-12-23 ENCOUNTER — Other Ambulatory Visit: Payer: Self-pay

## 2021-12-23 ENCOUNTER — Ambulatory Visit (INDEPENDENT_AMBULATORY_CARE_PROVIDER_SITE_OTHER): Payer: Medicare HMO | Admitting: Family Medicine

## 2021-12-23 VITALS — HR 80 | Temp 98.0°F | Ht 66.0 in | Wt 135.2 lb

## 2021-12-23 DIAGNOSIS — K2100 Gastro-esophageal reflux disease with esophagitis, without bleeding: Secondary | ICD-10-CM | POA: Diagnosis not present

## 2021-12-23 DIAGNOSIS — Z7189 Other specified counseling: Secondary | ICD-10-CM | POA: Diagnosis not present

## 2021-12-23 DIAGNOSIS — G2581 Restless legs syndrome: Secondary | ICD-10-CM

## 2021-12-23 DIAGNOSIS — E1159 Type 2 diabetes mellitus with other circulatory complications: Secondary | ICD-10-CM | POA: Diagnosis not present

## 2021-12-23 DIAGNOSIS — R630 Anorexia: Secondary | ICD-10-CM

## 2021-12-23 DIAGNOSIS — R339 Retention of urine, unspecified: Secondary | ICD-10-CM | POA: Diagnosis not present

## 2021-12-23 DIAGNOSIS — I152 Hypertension secondary to endocrine disorders: Secondary | ICD-10-CM

## 2021-12-23 DIAGNOSIS — J849 Interstitial pulmonary disease, unspecified: Secondary | ICD-10-CM | POA: Diagnosis not present

## 2021-12-23 DIAGNOSIS — E1169 Type 2 diabetes mellitus with other specified complication: Secondary | ICD-10-CM

## 2021-12-23 DIAGNOSIS — E1140 Type 2 diabetes mellitus with diabetic neuropathy, unspecified: Secondary | ICD-10-CM

## 2021-12-23 DIAGNOSIS — Z794 Long term (current) use of insulin: Secondary | ICD-10-CM

## 2021-12-23 DIAGNOSIS — M1009 Idiopathic gout, multiple sites: Secondary | ICD-10-CM | POA: Diagnosis not present

## 2021-12-23 DIAGNOSIS — E785 Hyperlipidemia, unspecified: Secondary | ICD-10-CM

## 2021-12-23 DIAGNOSIS — F325 Major depressive disorder, single episode, in full remission: Secondary | ICD-10-CM

## 2021-12-23 MED ORDER — MIRTAZAPINE 30 MG PO TABS: 30.0000 mg | ORAL_TABLET | Freq: Every day | ORAL | 11 refills | Status: AC

## 2021-12-23 MED ORDER — TAMSULOSIN HCL 0.4 MG PO CAPS: ORAL_CAPSULE | ORAL | 11 refills | Status: AC

## 2021-12-23 MED ORDER — MITIGARE 0.6 MG PO CAPS: ORAL_CAPSULE | ORAL | 0 refills | Status: AC

## 2021-12-23 MED ORDER — GLIPIZIDE ER 10 MG PO TB24: 10.0000 mg | ORAL_TABLET | Freq: Every day | ORAL | 11 refills | Status: AC

## 2021-12-23 MED ORDER — ONDANSETRON HCL 4 MG PO TABS: 4.0000 mg | ORAL_TABLET | Freq: Three times a day (TID) | ORAL | 2 refills | Status: AC | PRN

## 2021-12-23 MED ORDER — BREZTRI AEROSPHERE 160-9-4.8 MCG/ACT IN AERO: 2.0000 | INHALATION_SPRAY | Freq: Two times a day (BID) | RESPIRATORY_TRACT | 11 refills | Status: AC

## 2021-12-23 MED ORDER — GABAPENTIN 300 MG PO CAPS: 300.0000 mg | ORAL_CAPSULE | Freq: Every evening | ORAL | 0 refills | Status: AC | PRN

## 2021-12-23 MED ORDER — BENZONATATE 200 MG PO CAPS: 200.0000 mg | ORAL_CAPSULE | Freq: Three times a day (TID) | ORAL | 1 refills | Status: AC | PRN

## 2021-12-23 MED ORDER — EZETIMIBE 10 MG PO TABS: 10.0000 mg | ORAL_TABLET | Freq: Every day | ORAL | 3 refills | Status: AC

## 2021-12-23 MED ORDER — SACCHAROMYCES BOULARDII 250 MG PO CAPS: 250.0000 mg | ORAL_CAPSULE | Freq: Two times a day (BID) | ORAL | 11 refills | Status: AC

## 2021-12-23 NOTE — Assessment & Plan Note (Signed)
Patient denies curren depression or GAD... is is on remeron.

## 2021-12-23 NOTE — Assessment & Plan Note (Signed)
Not taking PPI as no symptoms.

## 2021-12-23 NOTE — Assessment & Plan Note (Signed)
Patient using insulin and glipizide on more a prn basis but given A1C at goal and given pt's desire to take it this way... we will continue.  Recheck A1C in 3 months.

## 2021-12-23 NOTE — Assessment & Plan Note (Signed)
Discussed benifit of medication vs SE... pt is tolerating well and LDL almost at goal < 70 with CAD... he wishes to continue at this time.

## 2021-12-23 NOTE — Assessment & Plan Note (Signed)
Pt denies current mood issues despite unrest at house. Will increase remeron to 30 mg daily to help with increasing appetite and to help with insomnia.

## 2021-12-23 NOTE — Assessment & Plan Note (Signed)
Stable, chronic.  Continue current medication.   Requip

## 2021-12-23 NOTE — Assessment & Plan Note (Signed)
End stage.  Palliative care appt scheduled 01/04/2022  Pt currently has  Home health care aide, RN and PT.

## 2021-12-23 NOTE — Assessment & Plan Note (Signed)
Using colchicine p[rn.

## 2021-12-23 NOTE — Patient Instructions (Signed)
Return urine sample if increased urinary frequency, urgency, burning, blood in urine, incontinence. Keep appt with palliative care and Urologist as planned. Increase mirtazapine to 30 mg daily for  sleep and appetite. Use Breztri twice daily.  Use gabapentin and benzonatate for cough.

## 2021-12-23 NOTE — Assessment & Plan Note (Signed)
No issues since weight loss.

## 2021-12-23 NOTE — Progress Notes (Signed)
Patient ID: Brian Bass, male    DOB: 1941/02/08, 81 y.o.   MRN: [id number]  This visit was conducted in person.  Pulse 80    Temp 98 F (36.7 C) (Temporal)    Ht 5' 6" (1.676 m)    Wt 135 lb 4 oz (61.3 kg)    SpO2 98% Comment: 3 L O2   BMI 21.83 kg/m    CC:  Chief Complaint  Patient presents with   DIscuss Medication    Subjective:   HPI: Brian Bass is a 81 y.o. male with end stage IPF and pulmonary hypertension on 3L continuous oxygen presenting on 12/23/2021 for DIscuss Medication He is present with his daughter Brian Bass today who is his HCPA. Per recent Pulmonary notes.. pt has been taken off of Ofev given significant SE. Including LFT change, weight loss, anorexia and GI bleeding.  1/3 Per Dr. Duwayne Bass they have discussed palliative care/hospice.  Chronic cough due to IPF: gabapentin, tessalon    He and his wife are having marital issues, fighting and home environment is turbulent.  He and wife are separating.. daughter is not allowed at house until wife is gone per report.   He has history of ESBL UTI as well as BPH.. he is current having some trouble self cath'ing.  He has appt with urology in 4 weeks. Continue flomax. If urinary symptoms occ, daughter will bring in urine sample. Cup given.   MDD, anorexia  Wt Readings from Last 3 Encounters:  12/23/21 135 lb 4 oz (61.3 kg)  12/07/21 134 lb 12.8 oz (61.1 kg)  11/02/21 133 lb 2 oz (60.4 kg)    DM:  If  < 270 takes the  glipizide.  If CBGs > 270.. takes insulin Lab Results  Component Value Date   HGBA1C 7.1 (H) 10/26/2021     HTN  BP Readings from Last 3 Encounters:  12/07/21 116/80  11/02/21 100/64  10/21/21 110/62          Relevant past medical, surgical, family and social history reviewed and updated as indicated. Interim medical history since our last visit reviewed. Allergies and medications reviewed and updated. Outpatient Medications Prior to Visit  Medication Sig Dispense Refill    ezetimibe (ZETIA) 10 MG tablet TAKE 1 TABLET BY MOUTH DAILY 90 tablet 1   LANTUS 100 UNIT/ML injection INJECT 55 UNITS SQ TWICE A DAY (Patient taking differently: 55 Units daily.) 100 mL 0   mirtazapine (REMERON) 15 MG tablet TAKE 1 TABLET BY MOUTH AT BEDTIME 30 tablet 11   saccharomyces boulardii (FLORASTOR) 250 MG capsule Take 1 capsule (250 mg total) by mouth 2 (two) times daily. 60 capsule 0   tamsulosin (FLOMAX) 0.4 MG CAPS capsule TAKE 2 CAPSULES EVERY DAY 60 capsule 5   ACCU-CHEK FASTCLIX LANCETS MISC 1 Device by Other route 3 (three) times daily. Check blood sugars 2 to 3 times daily.  Dx: 250.02 102 each 5   ACCU-CHEK SMARTVIEW test strip TEST BLOOD SUGAR 2 TO 3 TIMES DAILY 300 strip 3   benzonatate (TESSALON) 200 MG capsule Take 1 capsule (200 mg total) by mouth 3 (three) times daily as needed for cough. 40 capsule 1   Blood Glucose Monitoring Suppl (ACCU-CHEK NANO SMARTVIEW) W/DEVICE KIT 1 kit by Other route 3 (three) times daily. Use to check blood sugars 2 to 3 times daily.  Dx: 250.02 1 kit 0   Budeson-Glycopyrrol-Formoterol (BREZTRI AEROSPHERE) 160-9-4.8 MCG/ACT AERO Inhale 2 puffs into the lungs in  the morning and at bedtime. 10.7 g 11   fosfomycin (MONUROL) 3 g PACK      gabapentin (NEURONTIN) 300 MG capsule TAKE ONE CAPSULE AT BEDTIME 30 capsule 0   glipiZIDE (GLIPIZIDE XL) 10 MG 24 hr tablet Take 1 tablet (10 mg total) by mouth daily with breakfast. 30 tablet 11   Insulin Syringe-Needle U-100 (DROPLET INSULIN SYRINGE) 31G X 5/16" 0.3 ML MISC USE TO INJECT LANTUS INSULIN 2 TIMES DAILY 200 each 3   meloxicam (MOBIC) 7.5 MG tablet Take 1 tablet (7.5 mg total) by mouth daily. 30 tablet 0   MITIGARE 0.6 MG CAPS FOR FLARE TAKE 2 CAPSULES ONCE THEN TAKE1 CAPSULE IN 2 HOURS IF PAIN CONTINUES, THEN TAKE 1 CAPSULE DAILY 60 capsule 0   Multiple Vitamin (MULTIVITAMIN) tablet Take 1 tablet by mouth daily.     omeprazole (PRILOSEC) 20 MG capsule Take 1 capsule (20 mg total) by mouth 2 (two)  times daily before a meal. 180 capsule 1   ondansetron (ZOFRAN) 4 MG tablet Take 1 tablet (4 mg total) by mouth 3 (three) times daily as needed for nausea or vomiting. TAKE ONE TABLET BY MOUTH EVERY DAY AS NEEDED FOR NAUSEA 60 tablet 2   rOPINIRole (REQUIP) 0.25 MG tablet 1 tablet at bedtime if tolerated but not effective after 3-4 days, increase to 2 tabs then if tolerated but not effective after 3-4  more days, then  increase to 3 tabs  at bedtime, if still not effective call. 30 tablet 11   No facility-administered medications prior to visit.     Per HPI unless specifically indicated in ROS section below Review of Systems  Constitutional:  Positive for fatigue. Negative for fever.  HENT:  Negative for ear pain.   Eyes:  Negative for pain.  Respiratory:  Negative for cough and shortness of breath.   Cardiovascular:  Negative for chest pain, palpitations and leg swelling.  Gastrointestinal:  Negative for abdominal pain.  Genitourinary:  Negative for dysuria.  Musculoskeletal:  Negative for arthralgias.  Neurological:  Negative for syncope, light-headedness and headaches.  Psychiatric/Behavioral:  Negative for dysphoric mood.   Objective:  Pulse 80    Temp 98 F (36.7 C) (Temporal)    Ht 5' 6" (1.676 m)    Wt 135 lb 4 oz (61.3 kg)    SpO2 98% Comment: 3 L O2   BMI 21.83 kg/m   Wt Readings from Last 3 Encounters:  12/23/21 135 lb 4 oz (61.3 kg)  12/07/21 134 lb 12.8 oz (61.1 kg)  11/02/21 133 lb 2 oz (60.4 kg)      Physical Exam Constitutional:      Appearance: He is well-developed. He is cachectic.  HENT:     Head: Normocephalic.     Right Ear: Hearing normal.     Left Ear: Hearing normal.     Nose: Nose normal.  Neck:     Thyroid: No thyroid mass or thyromegaly.     Vascular: No carotid bruit.     Trachea: Trachea normal.  Cardiovascular:     Rate and Rhythm: Normal rate and regular rhythm.     Pulses: Normal pulses.     Heart sounds: Heart sounds not distant. No murmur  heard.   No friction rub. No gallop.     Comments: No peripheral edema Pulmonary:     Effort: No respiratory distress.     Breath sounds: Rales present.     Comments: On 3 L oxygen Skin:  General: Skin is warm and dry.     Findings: No rash.  Psychiatric:        Speech: Speech normal.        Behavior: Behavior normal.        Thought Content: Thought content normal.      Results for orders placed or performed in visit on 11/02/21  Urine Culture   Specimen: Urine  Result Value Ref Range   MICRO NUMBER: [id number]    SPECIMEN QUALITY: Adequate    Sample Source URINE    STATUS: FINAL    ISOLATE 1: Klebsiella oxytoca (A)       Susceptibility   Klebsiella oxytoca - URINE CULTURE, REFLEX    AMOX/CLAVULANIC 4 Sensitive     AMPICILLIN >=32 Resistant     AMPICILLIN/SULBACTAM 4 Intermediate     CEFAZOLIN* 16 Resistant      * For uncomplicated UTI caused by E. coli, K. pneumoniae or P. mirabilis: Cefazolin is susceptible if MIC <32 mcg/mL and predicts susceptible to the oral agents cefaclor, cefdinir, cefpodoxime, cefprozil, cefuroxime, cephalexin and loracarbef.     CEFTAZIDIME <=1 Sensitive     CEFEPIME <=1 Sensitive     CEFTRIAXONE <=1 Sensitive     CIPROFLOXACIN <=0.25 Sensitive     LEVOFLOXACIN 1 Intermediate     GENTAMICIN <=1 Sensitive     IMIPENEM <=0.25 Sensitive     NITROFURANTOIN 32 Sensitive     PIP/TAZO <=4 Sensitive     TOBRAMYCIN <=1 Sensitive     TRIMETH/SULFA* <=20 Sensitive      * For uncomplicated UTI caused by E. coli, K. pneumoniae or P. mirabilis: Cefazolin is susceptible if MIC <32 mcg/mL and predicts susceptible to the oral agents cefaclor, cefdinir, cefpodoxime, cefprozil, cefuroxime, cephalexin and loracarbef. Legend: S = Susceptible  I = Intermediate R = Resistant  NS = Not susceptible * = Not tested  NR = Not reported **NN = See antimicrobic comments   POCT Urinalysis Dipstick (Automated)  Result Value Ref Range   Color, UA Yellow     Clarity, UA Hazy    Glucose, UA Negative Negative   Bilirubin, UA Negative    Ketones, UA Negative    Spec Grav, UA 1.015 1.010 - 1.025   Blood, UA Trace    pH, UA 6.0 5.0 - 8.0   Protein, UA Negative Negative   Urobilinogen, UA 0.2 0.2 or 1.0 E.U./dL   Nitrite, UA Positive    Leukocytes, UA Large (3+) (A) Negative    This visit occurred during the SARS-CoV-2 public health emergency.  Safety protocols were in place, including screening questions prior to the visit, additional usage of staff PPE, and extensive cleaning of exam room while observing appropriate contact time as indicated for disinfecting solutions.   COVID 19 screen:  No recent travel or known exposure to COVID19 The patient denies respiratory symptoms of COVID 19 at this time. The importance of social distancing was discussed today.   Assessment and Plan Problem List Items Addressed This Visit     Hyperlipidemia associated with type 2 diabetes mellitus ([place]) (Chronic)    Discussed benifit of medication vs SE... pt is tolerating well and LDL almost at goal < 70 with CAD... he wishes to continue at this time.      Relevant Medications   ezetimibe (ZETIA) 10 MG tablet   glipiZIDE (GLIPIZIDE XL) 10 MG 24 hr tablet   Hypertension associated with diabetes (HCC) (Chronic)    No issues since weight loss.  Relevant Medications   ezetimibe (ZETIA) 10 MG tablet   glipiZIDE (GLIPIZIDE XL) 10 MG 24 hr tablet   Idiopathic gout of multiple sites (Chronic)    Using colchicine p[rn.      ILD (interstitial lung disease) (HCC) (Chronic)    End stage.  Palliative care appt scheduled 01/04/2022  Pt currently has  Home health care aide, RN and PT.      Type 2 diabetes mellitus with diabetic neuropathy, with long-term current use of insulin (HCC) (Chronic)    Patient using insulin and glipizide on more a prn basis but given A1C at goal and given pt's desire to take it this way... we will continue.  Recheck A1C in 3  months.       Relevant Medications   glipiZIDE (GLIPIZIDE XL) 10 MG 24 hr tablet   Counseling regarding end of life decision making - Primary   Decreased appetite    Pt denies current mood issues despite unrest at house. Will increase remeron to 30 mg daily to help with increasing appetite and to help with insomnia.       GERD (gastroesophageal reflux disease)    Not taking PPI as no symptoms.      Relevant Medications   ondansetron (ZOFRAN) 4 MG tablet   saccharomyces boulardii (FLORASTOR) 250 MG capsule   Major depressive disorder, single episode, in remission [facility])    Patient denies curren depression or GAD... is is on remeron.      Relevant Medications   mirtazapine (REMERON) 30 MG tablet   Restless leg syndrome    Stable, chronic.  Continue current medication.   Requip      Urinary retention with incomplete bladder emptying    Follow up with urology.. if worsening consider if mirtazapine could be exacerbating.      Meds ordered this encounter  Medications   benzonatate (TESSALON) 200 MG capsule    Sig: Take 1 capsule (200 mg total) by mouth 3 (three) times daily as needed for cough.    Dispense:  40 capsule    Refill:  1   ezetimibe (ZETIA) 10 MG tablet    Sig: Take 1 tablet (10 mg total) by mouth daily.    Dispense:  90 tablet    Refill:  3    PT OUT NEED REFILLS   Budeson-Glycopyrrol-Formoterol (BREZTRI AEROSPHERE) 160-9-4.8 MCG/ACT AERO    Sig: Inhale 2 puffs into the lungs in the morning and at bedtime.    Dispense:  10.7 g    Refill:  11   gabapentin (NEURONTIN) 300 MG capsule    Sig: Take 1 capsule (300 mg total) by mouth at bedtime as needed (cough).    Dispense:  30 capsule    Refill:  0   glipiZIDE (GLIPIZIDE XL) 10 MG 24 hr tablet    Sig: Take 1 tablet (10 mg total) by mouth daily with breakfast.    Dispense:  30 tablet    Refill:  11   mirtazapine (REMERON) 30 MG tablet    Sig: Take 1 tablet (30 mg total) by mouth at bedtime.    Dispense:   30 tablet    Refill:  11   MITIGARE 0.6 MG CAPS    Sig: 1 tab po daily prn gout flare    Dispense:  60 capsule    Refill:  0   ondansetron (ZOFRAN) 4 MG tablet    Sig: Take 1 tablet (4 mg total) by mouth 3 (three) times daily as needed  for nausea or vomiting. TAKE ONE TABLET BY MOUTH EVERY DAY AS NEEDED FOR NAUSEA    Dispense:  60 tablet    Refill:  2    For future refills.  Patient does not need these currently   saccharomyces boulardii (FLORASTOR) 250 MG capsule    Sig: Take 1 capsule (250 mg total) by mouth 2 (two) times daily.    Dispense:  60 capsule    Refill:  11   tamsulosin (FLOMAX) 0.4 MG CAPS capsule    Sig: TAKE 2 CAPSULES EVERY DAY    Dispense:  60 capsule    Refill:  11      [name], MD

## 2021-12-23 NOTE — Progress Notes (Signed)
Spoke with Brian Bass.  She states she does not know how Brian Bass takes the Requip.  She doesn't thinks he takes it regularly but will check with him on how he is taking it and let us know.  She is going to e mail her HCPOA paperwork to me.  I emailed her a DPR form for Brian Bass to complete so we have permission to speak with her.  She does not feel Brian Bass needs to be on his DPR.

## 2021-12-23 NOTE — Addendum Note (Signed)
Addended by: [name] E on: 12/23/2021 02:37 PM   Modules accepted: Orders

## 2021-12-23 NOTE — Assessment & Plan Note (Signed)
Follow up with urology.. if worsening consider if mirtazapine could be exacerbating.

## 2021-12-28 DIAGNOSIS — E1142 Type 2 diabetes mellitus with diabetic polyneuropathy: Secondary | ICD-10-CM | POA: Diagnosis not present

## 2021-12-28 DIAGNOSIS — D649 Anemia, unspecified: Secondary | ICD-10-CM | POA: Diagnosis not present

## 2021-12-28 DIAGNOSIS — N3000 Acute cystitis without hematuria: Secondary | ICD-10-CM | POA: Diagnosis not present

## 2021-12-28 DIAGNOSIS — A0472 Enterocolitis due to Clostridium difficile, not specified as recurrent: Secondary | ICD-10-CM | POA: Diagnosis not present

## 2021-12-28 DIAGNOSIS — B9620 Unspecified Escherichia coli [E. coli] as the cause of diseases classified elsewhere: Secondary | ICD-10-CM | POA: Diagnosis not present

## 2021-12-28 DIAGNOSIS — I2510 Atherosclerotic heart disease of native coronary artery without angina pectoris: Secondary | ICD-10-CM | POA: Diagnosis not present

## 2021-12-28 DIAGNOSIS — K922 Gastrointestinal hemorrhage, unspecified: Secondary | ICD-10-CM | POA: Diagnosis not present

## 2021-12-28 DIAGNOSIS — I10 Essential (primary) hypertension: Secondary | ICD-10-CM | POA: Diagnosis not present

## 2021-12-28 DIAGNOSIS — J84112 Idiopathic pulmonary fibrosis: Secondary | ICD-10-CM | POA: Diagnosis not present

## 2022-01-03 DIAGNOSIS — J84112 Idiopathic pulmonary fibrosis: Secondary | ICD-10-CM | POA: Diagnosis not present

## 2022-01-03 DIAGNOSIS — R5383 Other fatigue: Secondary | ICD-10-CM | POA: Diagnosis not present

## 2022-01-03 DIAGNOSIS — K5730 Diverticulosis of large intestine without perforation or abscess without bleeding: Secondary | ICD-10-CM | POA: Diagnosis not present

## 2022-01-03 DIAGNOSIS — N390 Urinary tract infection, site not specified: Secondary | ICD-10-CM | POA: Diagnosis not present

## 2022-01-03 DIAGNOSIS — K449 Diaphragmatic hernia without obstruction or gangrene: Secondary | ICD-10-CM | POA: Diagnosis not present

## 2022-01-03 DIAGNOSIS — E119 Type 2 diabetes mellitus without complications: Secondary | ICD-10-CM | POA: Diagnosis not present

## 2022-01-03 DIAGNOSIS — I7143 Infrarenal abdominal aortic aneurysm, without rupture: Secondary | ICD-10-CM | POA: Diagnosis not present

## 2022-01-03 DIAGNOSIS — R110 Nausea: Secondary | ICD-10-CM | POA: Diagnosis not present

## 2022-01-03 DIAGNOSIS — K6289 Other specified diseases of anus and rectum: Secondary | ICD-10-CM | POA: Diagnosis not present

## 2022-01-03 DIAGNOSIS — R638 Other symptoms and signs concerning food and fluid intake: Secondary | ICD-10-CM | POA: Diagnosis not present

## 2022-01-03 DIAGNOSIS — E871 Hypo-osmolality and hyponatremia: Secondary | ICD-10-CM | POA: Diagnosis not present

## 2022-01-03 DIAGNOSIS — R1030 Lower abdominal pain, unspecified: Secondary | ICD-10-CM | POA: Diagnosis not present

## 2022-01-04 ENCOUNTER — Other Ambulatory Visit: Payer: Self-pay

## 2022-01-04 ENCOUNTER — Other Ambulatory Visit: Payer: Self-pay | Admitting: Nurse Practitioner

## 2022-01-04 ENCOUNTER — Telehealth: Payer: Self-pay | Admitting: Family Medicine

## 2022-01-04 DIAGNOSIS — K5730 Diverticulosis of large intestine without perforation or abscess without bleeding: Secondary | ICD-10-CM | POA: Diagnosis not present

## 2022-01-04 DIAGNOSIS — I7143 Infrarenal abdominal aortic aneurysm, without rupture: Secondary | ICD-10-CM | POA: Diagnosis not present

## 2022-01-04 DIAGNOSIS — K6289 Other specified diseases of anus and rectum: Secondary | ICD-10-CM | POA: Diagnosis not present

## 2022-01-04 NOTE — Telephone Encounter (Signed)
Call  Daughter, Curt Bears. Keflex  is not specifically linked to Cdiff.. but any antibiotic can cause. Have him take probiotics while he is on it No culture was done but UA looked positive per ER note.   No need for appt on Friday unless not improving

## 2022-01-04 NOTE — Telephone Encounter (Signed)
[facility] Day - Client TELEPHONE ADVICE RECORD [facility]urse Patient Name: LORENA CLEARMAN Gender: Male DOB: 1941/08/30 Age: 81 Y 42 M 11 D Return Phone Number: [phone] (Primary) Address: City/ State/ Zip: [place] [address] Client [facility] - Client Client Site [facility] - Day Provider [name] - MD Contact Type Call Who Is Calling Patient / Member / Family / Caregiver Call Type Triage / Clinical Caller Name Eyan Hagood Relationship To Patient Spouse Return Phone Number [phone] (Primary) Chief Complaint Medication Question (non symptomatic) Reason for Call Medication Question / Request Initial Comment Caller states she need to discuss medications with nurse. Caller states her husband was DX with a UTI last night. Translation No Nurse Assessment Nurse: Leilani Merl, RN, Heather Date/Time (Eastern Time): 01/04/2022 12:48:43 PM Confirm and document reason for call. If symptomatic, describe symptoms. ---caller states that her husband was diagnosed with a UTI last night and he was given keflex, but she is concerned about c-diff and she wants to speak with Dr. Diona Browner directly to see if that medication is ok for him. He has had c-diff 2 times with antibiotics before. Does the patient have any new or worsening symptoms? ---No Please document clinical information provided and list any resource used. ---Please call caller back and let her know if Dr. Diona Browner is ok with that medication. Disp. Time Eilene Ghazi Time) Disposition Final User 01/04/2022 12:54:29 PM Clinical Call Yes [place], RN, Caleen Essex

## 2022-01-04 NOTE — Telephone Encounter (Signed)
Pt daughter  Curt Bears called in stated pt was seen in the ED for a UTI and was prescribed RX Cephalexin  today . Wants to know if it would cause him to get C- Diff it happened in the pass. Please Advise # [phone]

## 2022-01-04 NOTE — Telephone Encounter (Signed)
Curt Bears notified as instructed by telephone.  She states patient is currently living at home but [name] is suppose to be moving out.  She states [name] told her that home health has stopped coming.  Curt Bears also was asking about the Palliative Care referral.  Per referral made by Dr. Patsey Berthold, Palliative Care was scheduled to come out today for consult.  She thought [name] cancelled  that appointment because Mr. Tisdale was in the hospital  Phone number provided so Curt Bears could call [facility] and reschedule Palliative consult.  We will keep appointment as scheduled for Friday but if Mr. People is doing good she will call and cancel that appointment.

## 2022-01-04 NOTE — Telephone Encounter (Signed)
See note below access; pts daughter also left phone note. Per appt notes pt already has FU ED appt on 01/07/22. Sending note to Dr Diona Browner and [name] CMA.

## 2022-01-05 ENCOUNTER — Telehealth: Payer: Self-pay | Admitting: Nurse Practitioner

## 2022-01-05 DIAGNOSIS — B9620 Unspecified Escherichia coli [E. coli] as the cause of diseases classified elsewhere: Secondary | ICD-10-CM | POA: Diagnosis not present

## 2022-01-05 DIAGNOSIS — I2510 Atherosclerotic heart disease of native coronary artery without angina pectoris: Secondary | ICD-10-CM | POA: Diagnosis not present

## 2022-01-05 DIAGNOSIS — J84112 Idiopathic pulmonary fibrosis: Secondary | ICD-10-CM | POA: Diagnosis not present

## 2022-01-05 DIAGNOSIS — I10 Essential (primary) hypertension: Secondary | ICD-10-CM | POA: Diagnosis not present

## 2022-01-05 DIAGNOSIS — K922 Gastrointestinal hemorrhage, unspecified: Secondary | ICD-10-CM | POA: Diagnosis not present

## 2022-01-05 DIAGNOSIS — N3000 Acute cystitis without hematuria: Secondary | ICD-10-CM | POA: Diagnosis not present

## 2022-01-05 DIAGNOSIS — E1142 Type 2 diabetes mellitus with diabetic polyneuropathy: Secondary | ICD-10-CM | POA: Diagnosis not present

## 2022-01-05 DIAGNOSIS — A0472 Enterocolitis due to Clostridium difficile, not specified as recurrent: Secondary | ICD-10-CM | POA: Diagnosis not present

## 2022-01-05 DIAGNOSIS — D649 Anemia, unspecified: Secondary | ICD-10-CM | POA: Diagnosis not present

## 2022-01-05 NOTE — Telephone Encounter (Signed)
Pt wife called to follow up on Antibiotic

## 2022-01-05 NOTE — Telephone Encounter (Signed)
Spoke with Curt Bears yesterday (daughter).  Should I call [name] as well?  Per last office visit, not sure if patient wanted [name] giving medical information to her?  Please advise.

## 2022-01-06 ENCOUNTER — Telehealth: Payer: Self-pay | Admitting: Family Medicine

## 2022-01-06 DIAGNOSIS — I2510 Atherosclerotic heart disease of native coronary artery without angina pectoris: Secondary | ICD-10-CM | POA: Diagnosis not present

## 2022-01-06 DIAGNOSIS — E1142 Type 2 diabetes mellitus with diabetic polyneuropathy: Secondary | ICD-10-CM | POA: Diagnosis not present

## 2022-01-06 DIAGNOSIS — B9620 Unspecified Escherichia coli [E. coli] as the cause of diseases classified elsewhere: Secondary | ICD-10-CM | POA: Diagnosis not present

## 2022-01-06 DIAGNOSIS — K922 Gastrointestinal hemorrhage, unspecified: Secondary | ICD-10-CM | POA: Diagnosis not present

## 2022-01-06 DIAGNOSIS — D649 Anemia, unspecified: Secondary | ICD-10-CM | POA: Diagnosis not present

## 2022-01-06 DIAGNOSIS — I10 Essential (primary) hypertension: Secondary | ICD-10-CM | POA: Diagnosis not present

## 2022-01-06 DIAGNOSIS — A0472 Enterocolitis due to Clostridium difficile, not specified as recurrent: Secondary | ICD-10-CM | POA: Diagnosis not present

## 2022-01-06 DIAGNOSIS — N3000 Acute cystitis without hematuria: Secondary | ICD-10-CM | POA: Diagnosis not present

## 2022-01-06 DIAGNOSIS — J84112 Idiopathic pulmonary fibrosis: Secondary | ICD-10-CM | POA: Diagnosis not present

## 2022-01-06 NOTE — Telephone Encounter (Signed)
Noted. No further call needed. Daughter ( HCPOA and primary caregiver) have been notified.

## 2022-01-06 NOTE — Telephone Encounter (Signed)
Spoke with patient's daughter Curt Bears and have rescheduled the Palliative Consult scheduled on 01/04/22 to 01/11/22 @ 2:30 PM.  (Patient was in the ED at the time of the initial appointment)

## 2022-01-06 NOTE — Telephone Encounter (Signed)
Called phone number listed in chart and voicemail states [employer].  Please advise.

## 2022-01-06 NOTE — Telephone Encounter (Signed)
Wife is not to be  given medical information,  but please call pt and make sure he has spoken with daughter about the antibiotics.  Wife now in hospital.

## 2022-01-07 ENCOUNTER — Telehealth (INDEPENDENT_AMBULATORY_CARE_PROVIDER_SITE_OTHER): Payer: Medicare HMO | Admitting: Family Medicine

## 2022-01-07 ENCOUNTER — Telehealth: Payer: Self-pay | Admitting: Family Medicine

## 2022-01-07 ENCOUNTER — Ambulatory Visit: Payer: Medicare HMO | Admitting: Family Medicine

## 2022-01-07 DIAGNOSIS — A0472 Enterocolitis due to Clostridium difficile, not specified as recurrent: Secondary | ICD-10-CM | POA: Diagnosis not present

## 2022-01-07 DIAGNOSIS — J849 Interstitial pulmonary disease, unspecified: Secondary | ICD-10-CM

## 2022-01-07 DIAGNOSIS — K5900 Constipation, unspecified: Secondary | ICD-10-CM

## 2022-01-07 DIAGNOSIS — D649 Anemia, unspecified: Secondary | ICD-10-CM | POA: Diagnosis not present

## 2022-01-07 DIAGNOSIS — N3000 Acute cystitis without hematuria: Secondary | ICD-10-CM | POA: Diagnosis not present

## 2022-01-07 DIAGNOSIS — R339 Retention of urine, unspecified: Secondary | ICD-10-CM

## 2022-01-07 DIAGNOSIS — B9620 Unspecified Escherichia coli [E. coli] as the cause of diseases classified elsewhere: Secondary | ICD-10-CM | POA: Diagnosis not present

## 2022-01-07 DIAGNOSIS — R630 Anorexia: Secondary | ICD-10-CM | POA: Diagnosis not present

## 2022-01-07 DIAGNOSIS — I10 Essential (primary) hypertension: Secondary | ICD-10-CM | POA: Diagnosis not present

## 2022-01-07 DIAGNOSIS — N390 Urinary tract infection, site not specified: Secondary | ICD-10-CM

## 2022-01-07 DIAGNOSIS — R531 Weakness: Secondary | ICD-10-CM

## 2022-01-07 DIAGNOSIS — I2510 Atherosclerotic heart disease of native coronary artery without angina pectoris: Secondary | ICD-10-CM | POA: Diagnosis not present

## 2022-01-07 DIAGNOSIS — B9689 Other specified bacterial agents as the cause of diseases classified elsewhere: Secondary | ICD-10-CM

## 2022-01-07 DIAGNOSIS — K922 Gastrointestinal hemorrhage, unspecified: Secondary | ICD-10-CM | POA: Diagnosis not present

## 2022-01-07 DIAGNOSIS — J84112 Idiopathic pulmonary fibrosis: Secondary | ICD-10-CM | POA: Diagnosis not present

## 2022-01-07 DIAGNOSIS — E1142 Type 2 diabetes mellitus with diabetic polyneuropathy: Secondary | ICD-10-CM | POA: Diagnosis not present

## 2022-01-07 NOTE — Progress Notes (Signed)
TELEPHONE VISIT  Due to national recommendations of social distancing due to [place] 19, Audio telehealth visit is felt to be most appropriate for this patient at this time.   I connected with Brian Bass on 01/07/22 at 12:00 PM EST by telephone and verified that I am speaking with the correct person using two identifiers.   I discussed the limitations, risks, security and privacy concerns of performing an evaluation and management service by telephone and the availability of in person appointments. I also discussed with the patient that there may be a patient responsible charge related to this service. The patient expressed understanding and agreed to proceed.  Patient location: Home Provider Location: [place] Participants: Christoffer Currier and Brian Bass   History of Present Illness: 81 year old male patient with diabetes, interstitial lung disease on continuous oxygen, CAD, enlarged prostate with history of retention and CKD presents after recent ER visit . Reviewed ER note in detail from 01/03/22 Dx with UTI, proctitis, constipation and hyponatremia.  CT abd pelvis showed:  1. No acute process identified. 2. Prominent rectal stool ball with surrounding induration suggesting proctitis. 3. Colonic diverticulosis. 4. Stable 3.3 cm infrarenal abdominal aortic aneurysm.  Treated with miralax 17 gm daily and cephalexin 500 mg BID x 7 days Hg was 12.7 Na 125... treated with IVF  K 4.5  Daughter Curt Bears is on the line with him as well as a caregiver at his house named Gus.  On review today urine culture returned showing Klebsiella resistant to amp   Last bowel movement daily.. soft. No further rectal pain since enema in ER.  Normal UOP. No dysuria.  Wt Readings from Last 3 Encounters:  01/11/22 136 lb 3.2 oz (61.8 kg)  12/23/21 135 lb 4 oz (61.3 kg)  12/07/21 134 lb 12.8 oz (61.1 kg)     Feeling weak.. no fever.    COVID 19 screen No recent travel or known  exposure to COVID19 The patient denies respiratory symptoms of COVID 19 at this time.  The importance of social distancing was discussed today.   Review of Systems  Constitutional:  Positive for malaise/fatigue. Negative for chills and fever.  HENT:  Negative for congestion and ear pain.   Eyes:  Negative for pain and redness.  Respiratory:  Negative for cough and shortness of breath.   Cardiovascular:  Negative for chest pain, palpitations and leg swelling.  Gastrointestinal:  Negative for abdominal pain, blood in stool, constipation, diarrhea, nausea and vomiting.  Genitourinary:  Negative for dysuria.  Musculoskeletal:  Negative for falls and myalgias.  Skin:  Negative for rash.  Neurological:  Negative for dizziness.  Psychiatric/Behavioral:  Negative for depression. The patient is not nervous/anxious.      Past Medical History:  Diagnosis Date   Abdominal aortic aneurysm (AAA)    Adenomatous colon polyp    Allergy    Arthritis    C. difficile diarrhea    Cataract    Complication of anesthesia    Per pt, hard to wake up past last colon in 2011!   COPD (chronic obstructive pulmonary disease) (HCC)    Diabetes mellitus    Diverticulitis    Diverticulosis    Enlarged prostate    Esophageal stricture    GERD (gastroesophageal reflux disease)    Gout    Hiatal hernia    History of colon polyps 922/2011   Hyperlipidemia    IBS (irritable bowel syndrome)    Internal hemorrhoids  Mitral regurgitation    Pulmonary fibrosis (HCC)    Schatzki's ring     reports that he has never smoked. He has never used smokeless tobacco. He reports that he does not drink alcohol and does not use drugs.   Current Outpatient Medications:    ACCU-CHEK FASTCLIX LANCETS MISC, 1 Device by Other route 3 (three) times daily. Check blood sugars 2 to 3 times daily.  Dx: 250.02, Disp: 102 each, Rfl: 5   ACCU-CHEK SMARTVIEW test strip, TEST BLOOD SUGAR 2 TO 3 TIMES DAILY, Disp: 300 strip, Rfl: 3    benzonatate (TESSALON) 200 MG capsule, Take 1 capsule (200 mg total) by mouth 3 (three) times daily as needed for cough., Disp: 40 capsule, Rfl: 1   Blood Glucose Monitoring Suppl (ACCU-CHEK NANO SMARTVIEW) W/DEVICE KIT, 1 kit by Other route 3 (three) times daily. Use to check blood sugars 2 to 3 times daily.  Dx: 250.02, Disp: 1 kit, Rfl: 0   Budeson-Glycopyrrol-Formoterol (BREZTRI AEROSPHERE) 160-9-4.8 MCG/ACT AERO, Inhale 2 puffs into the lungs in the morning and at bedtime., Disp: 10.7 g, Rfl: 11   cephALEXin (KEFLEX) 250 MG capsule, Take 500 mg by mouth 2 (two) times daily., Disp: , Rfl:    ezetimibe (ZETIA) 10 MG tablet, Take 1 tablet (10 mg total) by mouth daily., Disp: 90 tablet, Rfl: 3   gabapentin (NEURONTIN) 300 MG capsule, Take 1 capsule (300 mg total) by mouth at bedtime as needed (cough)., Disp: 30 capsule, Rfl: 0   glipiZIDE (GLIPIZIDE XL) 10 MG 24 hr tablet, Take 1 tablet (10 mg total) by mouth daily with breakfast., Disp: 30 tablet, Rfl: 11   Insulin Syringe-Needle U-100 (DROPLET INSULIN SYRINGE) 31G X 5/16" 0.3 ML MISC, USE TO INJECT LANTUS INSULIN 2 TIMES DAILY, Disp: 200 each, Rfl: 3   LANTUS 100 UNIT/ML injection, INJECT 55 UNITS SQ TWICE A DAY (Patient taking differently: 55 Units daily.), Disp: 100 mL, Rfl: 0   mirtazapine (REMERON) 30 MG tablet, Take 1 tablet (30 mg total) by mouth at bedtime., Disp: 30 tablet, Rfl: 11   MITIGARE 0.6 MG CAPS, 1 tab po daily prn gout flare, Disp: 60 capsule, Rfl: 0   Multiple Vitamin (MULTIVITAMIN) tablet, Take 1 tablet by mouth daily., Disp: , Rfl:    ondansetron (ZOFRAN) 4 MG tablet, Take 1 tablet (4 mg total) by mouth 3 (three) times daily as needed for nausea or vomiting. TAKE ONE TABLET BY MOUTH EVERY DAY AS NEEDED FOR NAUSEA, Disp: 60 tablet, Rfl: 2   rOPINIRole (REQUIP) 0.25 MG tablet, 1 tablet at bedtime if tolerated but not effective after 3-4 days, increase to 2 tabs then if tolerated but not effective after 3-4  more days, then   increase to 3 tabs  at bedtime, if still not effective call., Disp: 30 tablet, Rfl: 11   saccharomyces boulardii (FLORASTOR) 250 MG capsule, Take 1 capsule (250 mg total) by mouth 2 (two) times daily., Disp: 60 capsule, Rfl: 11   tamsulosin (FLOMAX) 0.4 MG CAPS capsule, TAKE 2 CAPSULES EVERY DAY, Disp: 60 capsule, Rfl: 11   Observations/Objective: There were no vitals taken for this visit.  Physical Exam  Physical Exam Constitutional:      General: The patient is not in acute distress. Pulmonary:     Effort: Pulmonary effort is normal. No respiratory distress.  Neurological:     Mental Status: The patient is alert and oriented to person, place, and time.  Psychiatric:        Mood  and Affect: Mood normal.        Behavior: Behavior normal.   Assessment and Plan    Problem List Items Addressed This Visit     ILD (interstitial lung disease) ([place]) (Chronic)    Palliative care visit rescheduled.[name] expect if family agreeable hospice would be best next step. Life expectancy likely < 6 months.      Constipation    Now resolved s/p disimpaction and on daily miralax 17 gm.      Decreased appetite    He has not yet increased remeron.. he will start 30 mg daily.      Urinary retention with incomplete bladder emptying    Good emptying per pt now. Has follow up with urology. Has tools to I/O cath if needed.      UTI due to Klebsiella species - Primary    Acute, improved on antibiotics. Complete series.      Relevant Medications   cephALEXin (KEFLEX) 250 MG capsule   Weakness generalized    Likely due to poor po intake.          I discussed the assessment and treatment plan with the patient. The patient was provided an opportunity to ask questions and all were answered. The patient agreed with the plan and demonstrated an understanding of the instructions.   The patient was advised to call back or seek an in-person evaluation if the symptoms worsen or if the condition fails to  improve as anticipated.  I provided 17 minutes of non-face-to-face time during this encounter.   [name], MD

## 2022-01-07 NOTE — Telephone Encounter (Signed)
Signed hospice orders and gave to [facility] waiting in office.  Let me know if something else needs to be done.

## 2022-01-07 NOTE — Telephone Encounter (Signed)
[facility] hosp[facility] called to wanted to get verbal orders to start hospice care  Please call [phone]

## 2022-01-07 NOTE — Telephone Encounter (Signed)
Home Health verbal orders Caller Name: ahnegla Agency Name: community hospice  Callback number: [phone]  [place] started, and to continue being the PCP  Reason: chronic respiratory issue, and pulmonary fibrous   Frequency:   Please forward to [facility] pool or providers CMA

## 2022-01-08 DIAGNOSIS — I340 Nonrheumatic mitral (valve) insufficiency: Secondary | ICD-10-CM | POA: Diagnosis not present

## 2022-01-08 DIAGNOSIS — J84112 Idiopathic pulmonary fibrosis: Secondary | ICD-10-CM | POA: Diagnosis not present

## 2022-01-08 DIAGNOSIS — J849 Interstitial pulmonary disease, unspecified: Secondary | ICD-10-CM | POA: Diagnosis not present

## 2022-01-08 DIAGNOSIS — R0609 Other forms of dyspnea: Secondary | ICD-10-CM | POA: Diagnosis not present

## 2022-01-08 DIAGNOSIS — I272 Pulmonary hypertension, unspecified: Secondary | ICD-10-CM | POA: Diagnosis not present

## 2022-01-08 DIAGNOSIS — I2510 Atherosclerotic heart disease of native coronary artery without angina pectoris: Secondary | ICD-10-CM | POA: Diagnosis not present

## 2022-01-08 DIAGNOSIS — E1159 Type 2 diabetes mellitus with other circulatory complications: Secondary | ICD-10-CM

## 2022-01-08 DIAGNOSIS — J9611 Chronic respiratory failure with hypoxia: Secondary | ICD-10-CM | POA: Diagnosis not present

## 2022-01-08 DIAGNOSIS — R053 Chronic cough: Secondary | ICD-10-CM | POA: Diagnosis not present

## 2022-01-08 DIAGNOSIS — K222 Esophageal obstruction: Secondary | ICD-10-CM | POA: Diagnosis not present

## 2022-01-11 ENCOUNTER — Ambulatory Visit (INDEPENDENT_AMBULATORY_CARE_PROVIDER_SITE_OTHER): Admitting: Pulmonary Disease

## 2022-01-11 ENCOUNTER — Other Ambulatory Visit: Payer: Self-pay

## 2022-01-11 ENCOUNTER — Encounter: Payer: Self-pay | Admitting: Pulmonary Disease

## 2022-01-11 ENCOUNTER — Other Ambulatory Visit: Payer: Self-pay | Admitting: Nurse Practitioner

## 2022-01-11 VITALS — BP 100/80 | HR 91 | Temp 97.0°F | Ht 64.5 in | Wt 136.2 lb

## 2022-01-11 DIAGNOSIS — J9611 Chronic respiratory failure with hypoxia: Secondary | ICD-10-CM

## 2022-01-11 DIAGNOSIS — I272 Pulmonary hypertension, unspecified: Secondary | ICD-10-CM | POA: Diagnosis not present

## 2022-01-11 DIAGNOSIS — R053 Chronic cough: Secondary | ICD-10-CM

## 2022-01-11 DIAGNOSIS — J84112 Idiopathic pulmonary fibrosis: Secondary | ICD-10-CM

## 2022-01-11 DIAGNOSIS — K5900 Constipation, unspecified: Secondary | ICD-10-CM | POA: Insufficient documentation

## 2022-01-11 NOTE — Assessment & Plan Note (Signed)
Likely due to poor po intake.

## 2022-01-11 NOTE — Assessment & Plan Note (Signed)
Good emptying per pt now. Has follow up with urology. Has tools to I/O cath if needed.

## 2022-01-11 NOTE — Assessment & Plan Note (Signed)
He has not yet increased remeron.. he will start 30 mg daily.

## 2022-01-11 NOTE — Progress Notes (Incomplete)
Subjective:    Patient ID: Brian Bass, male    DOB: [birth year], 81 y.o.   MRN: [id number] Chief Complaint  Patient presents with   Follow-up    HPI    Review of Systems A 10 point review of systems was performed and it is as noted above otherwise negative.  Patient Active Problem List   Diagnosis Date Noted   Decreased appetite 12/23/2021   Major depressive disorder, single episode, in remission ([place]) 12/10/2021   Mitral regurgitation 12/09/2021   Restless leg syndrome 11/02/2021   UTI due to extended-spectrum beta lactamase (ESBL) producing Escherichia coli 11/02/2021   Abnormal CT of liver 04/02/2021   Urinary retention with incomplete bladder emptying 01/19/2021   Atherosclerosis of native coronary artery of native heart with stable angina pectoris ([place]) 03/23/2019   Elevated PSA 01/29/2019   Diabetic polyneuropathy associated with type 2 diabetes mellitus ([place]) 01/29/2019   DOE (dyspnea on exertion) 02/08/2018   Benign prostatic hyperplasia with nocturia 02/08/2018   Counseling regarding end of life decision making 07/14/2015   GERD (gastroesophageal reflux disease) 12/13/2013   ILD (interstitial lung disease) ([place]) 11/08/2013   CKD (chronic kidney disease) stage 2, GFR 60-89 ml/min 09/06/2013   Idiopathic gout of multiple sites 12/03/2009   CAD (coronary artery disease) 11/02/2009   Type 2 diabetes mellitus with diabetic neuropathy, with long-term current use of insulin ([place]) 08/26/2009   Hyperlipidemia associated with type 2 diabetes mellitus ([place]) 08/26/2009   Hypertension associated with diabetes ([place]) 08/26/2009   OSTEOARTHRITIS, MULTIPLE JOINTS 08/26/2009   ESOPHAGEAL STRICTURE 10/14/2008   IRRITABLE BOWEL SYNDROME 09/02/2008   PERSONAL HX COLONIC POLYPS 09/02/2008   Social History   Tobacco Use   Smoking status: Never   Smokeless tobacco: Never  Substance Use Topics   Alcohol use: No   Allergies  Allergen Reactions    Ciprofloxacin Swelling     Severe joint pain and swelling   Current Meds  Medication Sig   ACCU-CHEK FASTCLIX LANCETS MISC 1 Device by Other route 3 (three) times daily. Check blood sugars 2 to 3 times daily.  Dx: 250.02   ACCU-CHEK SMARTVIEW test strip TEST BLOOD SUGAR 2 TO 3 TIMES DAILY   benzonatate (TESSALON) 200 MG capsule Take 1 capsule (200 mg total) by mouth 3 (three) times daily as needed for cough.   Blood Glucose Monitoring Suppl (ACCU-CHEK NANO SMARTVIEW) W/DEVICE KIT 1 kit by Other route 3 (three) times daily. Use to check blood sugars 2 to 3 times daily.  Dx: 250.02   Budeson-Glycopyrrol-Formoterol (BREZTRI AEROSPHERE) 160-9-4.8 MCG/ACT AERO Inhale 2 puffs into the lungs in the morning and at bedtime.   cephALEXin (KEFLEX) 250 MG capsule Take 500 mg by mouth 2 (two) times daily.   ezetimibe (ZETIA) 10 MG tablet Take 1 tablet (10 mg total) by mouth daily.   gabapentin (NEURONTIN) 300 MG capsule Take 1 capsule (300 mg total) by mouth at bedtime as needed (cough).   glipiZIDE (GLIPIZIDE XL) 10 MG 24 hr tablet Take 1 tablet (10 mg total) by mouth daily with breakfast.   Insulin Syringe-Needle U-100 (DROPLET INSULIN SYRINGE) 31G X 5/16" 0.3 ML MISC USE TO INJECT LANTUS INSULIN 2 TIMES DAILY   LANTUS 100 UNIT/ML injection INJECT 55 UNITS SQ TWICE A DAY (Patient taking differently: 55 Units daily.)   mirtazapine (REMERON) 30 MG tablet Take 1 tablet (30 mg total) by mouth at bedtime.   MITIGARE 0.6 MG CAPS 1 tab po daily prn gout flare   Multiple  Vitamin (MULTIVITAMIN) tablet Take 1 tablet by mouth daily.   ondansetron (ZOFRAN) 4 MG tablet Take 1 tablet (4 mg total) by mouth 3 (three) times daily as needed for nausea or vomiting. TAKE ONE TABLET BY MOUTH EVERY DAY AS NEEDED FOR NAUSEA   rOPINIRole (REQUIP) 0.25 MG tablet 1 tablet at bedtime if tolerated but not effective after 3-4 days, increase to 2 tabs then if tolerated but not effective after 3-4  more days, then   increase to 3 tabs  at bedtime, if still not effective call.   saccharomyces boulardii (FLORASTOR) 250 MG capsule Take 1 capsule (250 mg total) by mouth 2 (two) times daily.   tamsulosin (FLOMAX) 0.4 MG CAPS capsule TAKE 2 CAPSULES EVERY DAY   Immunization History  Administered Date(s) Administered   Fluad Quad(high Dose 65+) 08/08/2019, 09/30/2020, 11/02/2021   Influenza Split 10/05/2011, 10/11/2012   Influenza Whole 11/12/2010   Influenza,inj,Quad PF,6+ Mos 09/06/2013, 09/24/2014, 10/02/2015, 09/13/2016, 09/29/2017, 08/24/2018   PFIZER(Purple Top)SARS-COV-2 Vaccination 01/22/2020, 02/12/2020   Pneumococcal Conjugate-13 01/12/2018   Pneumococcal Polysaccharide-23 12/06/2007   Td 08/10/2010, 04/10/2020        Objective:   Physical Exam BP 100/80 (BP Location: Left Arm, Patient Position: Sitting, Cuff Size: Normal)    Pulse 91    Temp (!) 97 F (36.1 C) (Oral)    Ht 5' 4.5" (1.638 m)    Wt 136 lb 3.2 oz (61.8 kg)    SpO2 94%    BMI 23.02 kg/m         Assessment & Plan:

## 2022-01-11 NOTE — Patient Instructions (Signed)
We are going to try to get a different set up for oxygen for you.  Continue your medications as you are doing.   We will see you in follow-up in 6 to 8 weeks time this visit can be a virtual/phone visit.

## 2022-01-11 NOTE — Assessment & Plan Note (Signed)
Palliative care visit rescheduled.[name] expect if family agreeable hospice would be best next step. Life expectancy likely < 6 months.

## 2022-01-11 NOTE — Assessment & Plan Note (Signed)
Acute, improved on antibiotics. Complete series.

## 2022-01-11 NOTE — Assessment & Plan Note (Signed)
Now resolved s/p disimpaction and on daily miralax 17 gm.

## 2022-02-10 ENCOUNTER — Telehealth: Payer: Self-pay

## 2022-02-10 NOTE — Telephone Encounter (Signed)
Patient daughter called need to have clearance to have tooth pulled. He is under hospice care do we need to do in person visit.  ?

## 2022-02-11 NOTE — Telephone Encounter (Signed)
He is cleared to have tooth pulled. On no anticoagulant. No appt needed.  ?BP Readings from Last 3 Encounters:  ?01/11/22 100/80  ?12/07/21 116/80  ?11/02/21 100/64  ? ? ?

## 2022-02-14 NOTE — Telephone Encounter (Signed)
Left message on  patient's home phone to call the office back.  ?Left message on patient's daughter home number to call the office back. (Not on DPR) ?

## 2022-02-14 NOTE — Telephone Encounter (Signed)
Spoke with Brian Bass.  She states the dentist went ahead and pulled his tooth.  Nothing further is needed. ?

## 2022-02-28 ENCOUNTER — Other Ambulatory Visit: Payer: Self-pay

## 2022-02-28 ENCOUNTER — Telehealth (INDEPENDENT_AMBULATORY_CARE_PROVIDER_SITE_OTHER): Payer: Medicare HMO | Admitting: Pulmonary Disease

## 2022-02-28 ENCOUNTER — Encounter: Payer: Self-pay | Admitting: Pulmonary Disease

## 2022-02-28 DIAGNOSIS — J849 Interstitial pulmonary disease, unspecified: Secondary | ICD-10-CM

## 2022-02-28 NOTE — Progress Notes (Signed)
? ?  Subjective:  ? ? Patient ID: Brian Bass, male    DOB: 12-17-1940, 81 y.o.   MRN: [id number] ? ?HPI ? ? ? ?Review of Systems ? ?   ?Objective:  ? Physical Exam ? ? ? ? ?   ?Assessment & Plan:  ? ?Attempted to connect via video, though it appeared that there was a video connection and it did not appear to be the patient's home.  Nobody would identify themselves or come to the camera.  Attempted to call the patient via telephone x2, left voicemail,, no response.  Patient should be considered as NO SHOW for this visit ?

## 2022-02-28 NOTE — Progress Notes (Deleted)
? ?  Subjective:  ? ? Patient ID: Brian Bass, male    DOB: 1941/07/23, 82 y.o.   MRN: [id number] ? ?HPI ? ? ? ?Review of Systems ? ?   ?Objective:  ? Physical Exam ? ? ? ? ?   ?Assessment & Plan:  ? ? ?

## 2022-04-22 ENCOUNTER — Telehealth: Payer: Self-pay | Admitting: Family Medicine

## 2022-04-22 NOTE — Telephone Encounter (Signed)
N/A unable to leave a message for patient to call back to schedule Medicare Annual Wellness Visit   Last AWV  04/27/21   Any questions, please call me at [phone]

## 2022-04-26 ENCOUNTER — Telehealth: Payer: Self-pay | Admitting: Cardiovascular Disease

## 2022-04-26 NOTE — Telephone Encounter (Signed)
3 attempts to schedule fu appt from recall list.   Deleting recall.

## 2022-06-17 NOTE — Telephone Encounter (Signed)
error 

## 2022-09-11 ENCOUNTER — Encounter: Payer: Self-pay | Admitting: Emergency Medicine

## 2022-09-11 ENCOUNTER — Emergency Department
Admission: EM | Admit: 2022-09-11 | Discharge: 2022-09-12 | Disposition: A | Discharge status: Home / Self Care | Attending: Emergency Medicine | Admitting: Emergency Medicine

## 2022-09-11 ENCOUNTER — Other Ambulatory Visit: Payer: Self-pay

## 2022-09-11 DIAGNOSIS — R1084 Generalized abdominal pain: Secondary | ICD-10-CM | POA: Insufficient documentation

## 2022-09-11 DIAGNOSIS — Z20822 Contact with and (suspected) exposure to covid-19: Secondary | ICD-10-CM | POA: Diagnosis not present

## 2022-09-11 DIAGNOSIS — J849 Interstitial pulmonary disease, unspecified: Secondary | ICD-10-CM

## 2022-09-11 DIAGNOSIS — R0789 Other chest pain: Secondary | ICD-10-CM | POA: Diagnosis not present

## 2022-09-11 DIAGNOSIS — R109 Unspecified abdominal pain: Secondary | ICD-10-CM | POA: Diagnosis not present

## 2022-09-11 DIAGNOSIS — R079 Chest pain, unspecified: Secondary | ICD-10-CM | POA: Diagnosis present

## 2022-09-11 DIAGNOSIS — J84115 Respiratory bronchiolitis interstitial lung disease: Secondary | ICD-10-CM | POA: Diagnosis not present

## 2022-09-11 LAB — RESP PANEL BY RT-PCR (FLU A&B, COVID) ARPGX2
Influenza A by PCR: NEGATIVE
Influenza B by PCR: NEGATIVE
SARS Coronavirus 2 by RT PCR: NEGATIVE

## 2022-09-11 LAB — CBC WITH DIFFERENTIAL/PLATELET
Abs Immature Granulocytes: 0.1 10*3/uL — ABNORMAL HIGH (ref 0.00–0.07)
Basophils Absolute: 0 10*3/uL (ref 0.0–0.1)
Basophils Relative: 0 %
Eosinophils Absolute: 0.1 10*3/uL (ref 0.0–0.5)
Eosinophils Relative: 1 %
HCT: 40.4 % (ref 39.0–52.0)
Hemoglobin: 13.3 g/dL (ref 13.0–17.0)
Immature Granulocytes: 1 %
Lymphocytes Relative: 28 %
Lymphs Abs: 2.4 10*3/uL (ref 0.7–4.0)
MCH: 29.6 pg (ref 26.0–34.0)
MCHC: 32.9 g/dL (ref 30.0–36.0)
MCV: 90 fL (ref 80.0–100.0)
Monocytes Absolute: 0.9 10*3/uL (ref 0.1–1.0)
Monocytes Relative: 11 %
Neutro Abs: 5 10*3/uL (ref 1.7–7.7)
Neutrophils Relative %: 59 %
Platelets: 192 10*3/uL (ref 150–400)
RBC: 4.49 MIL/uL (ref 4.22–5.81)
RDW: 13.3 % (ref 11.5–15.5)
WBC: 8.5 10*3/uL (ref 4.0–10.5)
nRBC: 0 % (ref 0.0–0.2)

## 2022-09-11 LAB — BASIC METABOLIC PANEL
Anion gap: 8 (ref 5–15)
BUN: 29 mg/dL — ABNORMAL HIGH (ref 8–23)
CO2: 27 mmol/L (ref 22–32)
Calcium: 9.7 mg/dL (ref 8.9–10.3)
Chloride: 92 mmol/L — ABNORMAL LOW (ref 98–111)
Creatinine, Ser: 0.95 mg/dL (ref 0.61–1.24)
GFR, Estimated: >60 mL/min (ref >60)
Glucose, Bld: 261 mg/dL — ABNORMAL HIGH (ref 70–99)
Potassium: 4.7 mmol/L (ref 3.5–5.1)
Sodium: 127 mmol/L — ABNORMAL LOW (ref 135–145)

## 2022-09-11 LAB — TROPONIN I (HIGH SENSITIVITY)
Troponin I (High Sensitivity): 12 ng/L (ref <18)
Troponin I (High Sensitivity): 12 ng/L (ref <18)

## 2022-09-11 LAB — URINALYSIS, ROUTINE W REFLEX MICROSCOPIC
Bacteria, UA: NONE SEEN
Bilirubin Urine: NEGATIVE
Glucose, UA: >=500 mg/dL — AB
Ketones, ur: NEGATIVE mg/dL
Leukocytes,Ua: NEGATIVE
Nitrite: NEGATIVE
Protein, ur: NEGATIVE mg/dL
Specific Gravity, Urine: 1.017 (ref 1.005–1.030)
pH: 5 (ref 5.0–8.0)

## 2022-09-11 LAB — CK: Total CK: 40 U/L — ABNORMAL LOW (ref 49–397)

## 2022-09-11 MED ORDER — MORPHINE SULFATE (PF) 4 MG/ML IV SOLN
4.0000 mg | Freq: Once | INTRAVENOUS | Status: AC
  Administered 2022-09-12: 4 mg via INTRAVENOUS
  Filled 2022-09-11: qty 1

## 2022-09-11 MED ORDER — SODIUM CHLORIDE 0.9 % IV BOLUS
500.0000 mL | Freq: Once | INTRAVENOUS | Status: AC
  Administered 2022-09-12: 500 mL via INTRAVENOUS

## 2022-09-11 NOTE — ED Notes (Signed)
Oxygen tank changed at this time.

## 2022-09-11 NOTE — ED Triage Notes (Signed)
Pt to ED from home c/o bilateral rib pain for a couple weeks without known injury.  States pain is worse with movement.  Hx of pulmonary fibrosis, chronic 4L [place] use.  Denies n/v/d, fever, urinary changes, denies new/worse SOB.  States not getting up out of bed as much but appetite has stayed the same, still eating and drinking.  Pt is on current hospice care.  PA in triage room for MSE.

## 2022-09-11 NOTE — ED Provider Notes (Signed)
[facility] Provider Note    None    (approximate)   History   Chest Pain   HPI {Remember to add pertinent medical, surgical, social, and/or OB history to HPI:1} Brian Bass is a 81 y.o. male  ***       Physical Exam   Triage Vital Signs: ED Triage Vitals  Enc Vitals Group     BP 09/11/22 1647 112/74     Pulse Rate 09/11/22 1647 90     Resp 09/11/22 1647 18     Temp 09/11/22 1647 97.8 F (36.6 C)     Temp Source 09/11/22 1647 Oral     SpO2 09/11/22 1647 100 %     Weight 09/11/22 1645 59 kg (130 lb)     Height 09/11/22 1645 1.651 m (_0 )     Head Circumference --      Peak Flow --      Pain Score 09/11/22 1645 5     Pain Loc --      Pain Edu? --      Excl. in [place]? --     Most recent vital signs: Vitals:   09/11/22 1647 09/11/22 2050  BP: 112/74 (!) 122/104  Pulse: 90 83  Resp: 18 17  Temp: 97.8 F (36.6 C)   SpO2: 100% 100%    {Only need to document appropriate and relevant physical exam:1} General: Awake, no distress. *** CV:  Good peripheral perfusion. *** Resp:  Normal effort. *** Abd:  No distention. *** Other:  ***   ED Results / Procedures / Treatments   Labs (all labs ordered are listed, but only abnormal results are displayed) Labs Reviewed  BASIC METABOLIC PANEL - Abnormal; Notable for the following components:      Result Value   Sodium 127 (*)    Chloride 92 (*)    Glucose, Bld 261 (*)    BUN 29 (*)    All other components within normal limits  CBC WITH DIFFERENTIAL/PLATELET - Abnormal; Notable for the following components:   Abs Immature Granulocytes 0.10 (*)    All other components within normal limits  URINALYSIS, ROUTINE W REFLEX MICROSCOPIC - Abnormal; Notable for the following components:   Color, Urine YELLOW (*)    APPearance CLEAR (*)    Glucose, UA >=500 (*)    Hgb urine dipstick SMALL (*)    All other components within normal limits  CK - Abnormal; Notable for the following  components:   Total CK 40 (*)    All other components within normal limits  RESP PANEL BY RT-PCR (FLU A&B, COVID) ARPGX2  TROPONIN I (HIGH SENSITIVITY)  TROPONIN I (HIGH SENSITIVITY)     EKG  ***   RADIOLOGY *** {USE THE WORD "INTERPRETED"!! You MUST document your own interpretation of imaging, as well as the fact that you reviewed the radiologist's report!:1}   PROCEDURES:  Critical Care performed: {CriticalCareYesNo:19197::"Yes, see critical care procedure note(s)","No"}  Procedures   MEDICATIONS ORDERED IN ED: Medications - No data to display   IMPRESSION / MDM / [place] / ED COURSE  I reviewed the triage vital signs and the nursing notes.                              Differential diagnosis includes, but is not limited to, ***  Patient's presentation is most consistent with {EM COPA:27473}  {If the patient is on the  monitor, remove the brackets and asterisks on the sentence below and remember to document it as a Procedure as well. Otherwise delete the sentence below:1} {**The patient is on the cardiac monitor to evaluate for evidence of arrhythmia and/or significant heart rate changes.**} {Remember to include, when applicable, any/all of the following data: independent review of imaging independent review of labs (comment specifically on pertinent positives and negatives) review of specific prior hospitalizations, PCP/specialist notes, etc. discuss meds given and prescribed document any discussion with consultants (including hospitalists) any clinical decision tools you used and why (PECARN, NEXUS, etc.) did you consider admitting the patient? document social determinants of health affecting patient's care (homelessness, inability to follow up in a timely fashion, etc) document any pre-existing conditions increasing risk on current visit (e.g. diabetes and HTN increasing danger of high-risk chest pain/ACS) describes what meds you gave (especially  parenteral) and why any other interventions?:1}     FINAL CLINICAL IMPRESSION(S) / ED DIAGNOSES   Final diagnoses:  None     Rx / DC Orders   ED Discharge Orders     None        Note:  This document was prepared using Dragon voice recognition software and may include unintentional dictation errors.

## 2022-09-11 NOTE — ED Notes (Signed)
Oxygen tank changed at this time

## 2022-09-11 NOTE — ED Provider Triage Note (Signed)
Emergency Medicine Provider Triage Evaluation Note  [name] , a 81 y.o. male  was evaluated in triage.  Pt complains of bilateral rib pain for a couple of months. Worse with movement. Has pulmonary fibrosis on 4L O2, has cough. No n/v/d. No fevers. Hasnt wanted to get out of bed for 4 days. No change in appetite or urination. Took morphine 1 hour ago   Review of Systems  Positive: Rib pain Negative: Abd pain, n/v/d, fever/chills  Physical Exam  There were no vitals taken for this visit. Gen:   Awake, no distress   Resp:  Normal effort on O2 [place] MSK:   Moves extremities without difficulty  Other:    Medical Decision Making  Medically screening exam initiated at 4:41 PM.  Appropriate orders placed.  Leeroy L Bonnin was informed that the remainder of the evaluation will be completed by another provider, this initial triage assessment does not replace that evaluation, and the importance of remaining in the ED until their evaluation is complete.     Marquette Old, PA-C 09/11/22 1645

## 2022-09-12 ENCOUNTER — Emergency Department

## 2022-09-12 DIAGNOSIS — R109 Unspecified abdominal pain: Secondary | ICD-10-CM | POA: Diagnosis not present

## 2022-09-12 LAB — HEPATIC FUNCTION PANEL
ALT: 27 U/L (ref 0–44)
AST: 26 U/L (ref 15–41)
Albumin: 4.4 g/dL (ref 3.5–5.0)
Alkaline Phosphatase: 88 U/L (ref 38–126)
Bilirubin, Direct: 0.3 mg/dL — ABNORMAL HIGH (ref 0.0–0.2)
Indirect Bilirubin: 0.8 mg/dL (ref 0.3–0.9)
Total Bilirubin: 1.1 mg/dL (ref 0.3–1.2)
Total Protein: 7.9 g/dL (ref 6.5–8.1)

## 2022-09-12 LAB — LIPASE, BLOOD: Lipase: 36 U/L (ref 11–51)

## 2022-09-12 MED ORDER — IOHEXOL 300 MG/ML  SOLN
80.0000 mL | Freq: Once | INTRAMUSCULAR | Status: AC | PRN
  Administered 2022-09-12: 80 mL via INTRAVENOUS

## 2022-09-12 NOTE — Discharge Instructions (Addendum)
As we discussed, your evaluation was reassuring tonight with no evidence of any new medical issue.  Please take your regular medications including your pain and anxiety medication as written by your hospice provider.  Follow-up with your regular doctor at the next available opportunity.  Return to the emergency department if you develop new or worsening symptoms that concern you.

## 2022-09-12 NOTE — ED Notes (Signed)
To CT

## 2022-09-19 ENCOUNTER — Telehealth: Payer: Self-pay | Admitting: Family Medicine

## 2022-09-19 NOTE — Telephone Encounter (Signed)
Noted.

## 2022-09-19 NOTE — Telephone Encounter (Signed)
Kim from [place] called to leat dR bedsole know that patients daughter has requested for transfer to [facility]. The transfer should happen on tomorrow 09/20/22.

## 2022-09-20 ENCOUNTER — Telehealth: Payer: Self-pay

## 2022-09-20 NOTE — Chronic Care Management (AMB) (Addendum)
Chronic Care Management Pharmacy Assistant   Name: Brian Bass  MRN: [id number] DOB: 11-19-1941  Reason for Encounter: Hospital Follow up  Medications: Outpatient Encounter Medications as of 09/20/2022  Medication Sig   ACCU-CHEK FASTCLIX LANCETS MISC 1 Device by Other route 3 (three) times daily. Check blood sugars 2 to 3 times daily.  Dx: 250.02   ACCU-CHEK SMARTVIEW test strip TEST BLOOD SUGAR 2 TO 3 TIMES DAILY   benzonatate (TESSALON) 200 MG capsule Take 1 capsule (200 mg total) by mouth 3 (three) times daily as needed for cough.   Blood Glucose Monitoring Suppl (ACCU-CHEK NANO SMARTVIEW) W/DEVICE KIT 1 kit by Other route 3 (three) times daily. Use to check blood sugars 2 to 3 times daily.  Dx: 250.02   Budeson-Glycopyrrol-Formoterol (BREZTRI AEROSPHERE) 160-9-4.8 MCG/ACT AERO Inhale 2 puffs into the lungs in the morning and at bedtime.   cephALEXin (KEFLEX) 250 MG capsule Take 500 mg by mouth 2 (two) times daily.   ezetimibe (ZETIA) 10 MG tablet Take 1 tablet (10 mg total) by mouth daily.   gabapentin (NEURONTIN) 300 MG capsule Take 1 capsule (300 mg total) by mouth at bedtime as needed (cough).   glipiZIDE (GLIPIZIDE XL) 10 MG 24 hr tablet Take 1 tablet (10 mg total) by mouth daily with breakfast.   Insulin Syringe-Needle U-100 (DROPLET INSULIN SYRINGE) 31G X 5/16" 0.3 ML MISC USE TO INJECT LANTUS INSULIN 2 TIMES DAILY   LANTUS 100 UNIT/ML injection INJECT 55 UNITS SQ TWICE A DAY (Patient taking differently: 55 Units daily.)   mirtazapine (REMERON) 30 MG tablet Take 1 tablet (30 mg total) by mouth at bedtime.   MITIGARE 0.6 MG CAPS 1 tab po daily prn gout flare   Multiple Vitamin (MULTIVITAMIN) tablet Take 1 tablet by mouth daily.   ondansetron (ZOFRAN) 4 MG tablet Take 1 tablet (4 mg total) by mouth 3 (three) times daily as needed for nausea or vomiting. TAKE ONE TABLET BY MOUTH EVERY DAY AS NEEDED FOR NAUSEA   rOPINIRole (REQUIP) 0.25 MG tablet 1 tablet at bedtime if  tolerated but not effective after 3-4 days, increase to 2 tabs then if tolerated but not effective after 3-4  more days, then  increase to 3 tabs  at bedtime, if still not effective call.   saccharomyces boulardii (FLORASTOR) 250 MG capsule Take 1 capsule (250 mg total) by mouth 2 (two) times daily.   tamsulosin (FLOMAX) 0.4 MG CAPS capsule TAKE 2 CAPSULES EVERY DAY   No facility-administered encounter medications on file as of 09/20/2022.    Patient under hospice care   Reviewed hospital notes for details of recent visit. Has patient been contacted by Transitions of Care team? No Has patient seen PCP/specialist for hospital follow up (summarize OV if yes): Yes 09/19/22  telephone call for orders to be signed from PCP  Admitted to the ED on 09/11/22. Discharge date was 09/12/22.  Discharged from [facility] ED .   Discharge diagnosis (Principal Problem): Interstitial lung disease  Patient was discharged to Home  Brief summary of hospital course: His vital signs are actually reassuring.  Labs/studies ordered: EKG, CBC with differential, basic metabolic panel, CK, respiratory viral panel, hepatic function panel, lipase, high-sensitivity troponin x2, urinalysis.  After talking with him and discovering that it is not actually chest pain that he is reporting but rather generalized abdominal pain, I ordered a CT scan of the abdomen and pelvis to try to provide reassurance that he does not have a new abnormality  that has been going on for couple weeks. I reviewed his lab work and it is all relatively reassuring.  He is persistently hyponatremic but this seems to be chronic for him.  I have verified his DNR status with his daughter and the patient also confirmed his wishes.  He has no evidence of acute infection, high-sensitivity troponin is normal x2, and he has no leukocytosis.   If the CT scan is reassuring, anticipate the patient will be able to be discharged home.  I looked in the [place] controlled  substance database and verified that he was recently prescribed 120 MMEs of morphine and I encouraged his daughter to have him take it as prescribed to try to control his chronic pain.  Tonight I ordered morphine 4 mg IV to help with the acute pain.  Patient and daughter agree with the current plan.  Spite of his long-term respiratory issues, as long as he is on his supplemental oxygen he is in no respiratory distress at this time.  Medications that remain the same after Hospital Discharge:??  -All other medications will remain the same.    Next CCM appt: none  Other upcoming appts: No appointments scheduled within the next 30 days.  Charlene Brooke, PharmD notified and will determine if action is needed.   Avel Sensor, [place]  [phone]   Pharmacist addendum: Patient under [facility]. No action needed.  Charlene Brooke, PharmD, BCACP 09/22/22 12:38 PM

## 2022-09-20 NOTE — Telephone Encounter (Signed)
[facility]. Called wants to know if Dr . Diona Browner will be pt attending provider would like a call back # [phone]

## 2022-09-20 NOTE — Telephone Encounter (Signed)
Spoke with Amy at [facility] and confirmed that Dr. Diona Browner would be the attending physician but will defer to hospice doctors for comfort care orders.

## 2022-09-20 NOTE — Telephone Encounter (Signed)
Agreed.

## 2022-09-28 ENCOUNTER — Telehealth: Payer: Self-pay | Admitting: Family Medicine

## 2022-09-28 NOTE — Telephone Encounter (Signed)
Spoke to patient to schedule Medicare Annual Wellness Visit (AWV) either virtually or phoneL  Patient didn't want phone appt   Last AWV  04/27/21    45 min for awv-i and in office appointments 30 min for awv-s  phone/virtual appointments

## 2022-10-19 DIAGNOSIS — W0110XA Fall on same level from slipping, tripping and stumbling with subsequent striking against unspecified object, initial encounter: Secondary | ICD-10-CM | POA: Diagnosis not present

## 2022-10-19 DIAGNOSIS — S12100A Unspecified displaced fracture of second cervical vertebra, initial encounter for closed fracture: Secondary | ICD-10-CM | POA: Diagnosis not present

## 2022-10-19 DIAGNOSIS — S12110A Anterior displaced Type II dens fracture, initial encounter for closed fracture: Secondary | ICD-10-CM | POA: Diagnosis not present

## 2022-10-19 DIAGNOSIS — S12001A Unspecified nondisplaced fracture of first cervical vertebra, initial encounter for closed fracture: Secondary | ICD-10-CM | POA: Diagnosis not present

## 2022-10-19 DIAGNOSIS — S0990XA Unspecified injury of head, initial encounter: Secondary | ICD-10-CM | POA: Diagnosis not present

## 2022-10-19 DIAGNOSIS — Z79899 Other long term (current) drug therapy: Secondary | ICD-10-CM | POA: Diagnosis not present

## 2022-10-19 DIAGNOSIS — S12030A Displaced posterior arch fracture of first cervical vertebra, initial encounter for closed fracture: Secondary | ICD-10-CM | POA: Diagnosis not present

## 2022-10-19 DIAGNOSIS — K219 Gastro-esophageal reflux disease without esophagitis: Secondary | ICD-10-CM | POA: Diagnosis not present

## 2022-10-19 DIAGNOSIS — Z7984 Long term (current) use of oral hypoglycemic drugs: Secondary | ICD-10-CM | POA: Diagnosis not present

## 2022-10-19 DIAGNOSIS — E119 Type 2 diabetes mellitus without complications: Secondary | ICD-10-CM | POA: Diagnosis not present

## 2022-10-19 DIAGNOSIS — Z888 Allergy status to other drugs, medicaments and biological substances status: Secondary | ICD-10-CM | POA: Diagnosis not present

## 2022-10-26 DIAGNOSIS — S12100A Unspecified displaced fracture of second cervical vertebra, initial encounter for closed fracture: Secondary | ICD-10-CM | POA: Diagnosis not present

## 2022-11-16 DIAGNOSIS — E119 Type 2 diabetes mellitus without complications: Secondary | ICD-10-CM | POA: Diagnosis not present

## 2022-11-18 ENCOUNTER — Telehealth: Payer: Self-pay | Admitting: Family Medicine

## 2022-11-18 NOTE — Telephone Encounter (Signed)
Noted.

## 2022-11-18 NOTE — Telephone Encounter (Signed)
Vivien Rota from [facility] called in and stated that patient is transferring over to [facility] effective today. Thank you!

## 2022-12-27 ENCOUNTER — Ambulatory Visit: Payer: Medicare HMO

## 2023-01-03 ENCOUNTER — Telehealth: Payer: Self-pay | Admitting: Family Medicine

## 2023-01-03 NOTE — Telephone Encounter (Signed)
LVM for pt to rtn my call to re-schedule AWV on 12/27/22.

## 2023-02-21 ENCOUNTER — Telehealth: Payer: Self-pay

## 2023-02-21 NOTE — Patient Outreach (Signed)
  Care Coordination   02/21/2023 Name: Brian Bass MRN: [id number] DOB: December 17, 1940   Care Coordination Outreach Attempts:  Contact established with patients daughter/ HPOA.  Daughter states patient is in Hospice care.   Follow Up Plan:  No further outreach attempts will be made at this time. We have been unable to contact the patient to offer or enroll patient in care coordination services  Encounter Outcome:  Pt. Visit Completed   Care Coordination Interventions:  No, not indicated    Quinn Plowman [facility] [phone] direct line

## 2023-04-12 ENCOUNTER — Emergency Department
Admission: EM | Admit: 2023-04-12 | Discharge: 2023-04-13 | Disposition: A | Discharge status: Home / Self Care | Payer: Medicare Other | Attending: Emergency Medicine | Admitting: Emergency Medicine

## 2023-04-12 ENCOUNTER — Other Ambulatory Visit: Payer: Self-pay

## 2023-04-12 DIAGNOSIS — R1110 Vomiting, unspecified: Secondary | ICD-10-CM | POA: Diagnosis not present

## 2023-04-12 DIAGNOSIS — I499 Cardiac arrhythmia, unspecified: Secondary | ICD-10-CM | POA: Diagnosis not present

## 2023-04-12 DIAGNOSIS — R1084 Generalized abdominal pain: Secondary | ICD-10-CM | POA: Diagnosis not present

## 2023-04-12 DIAGNOSIS — R112 Nausea with vomiting, unspecified: Secondary | ICD-10-CM | POA: Diagnosis not present

## 2023-04-12 DIAGNOSIS — R0689 Other abnormalities of breathing: Secondary | ICD-10-CM | POA: Diagnosis not present

## 2023-04-12 DIAGNOSIS — I491 Atrial premature depolarization: Secondary | ICD-10-CM | POA: Diagnosis not present

## 2023-04-12 DIAGNOSIS — K209 Esophagitis, unspecified without bleeding: Secondary | ICD-10-CM | POA: Insufficient documentation

## 2023-04-12 DIAGNOSIS — R9431 Abnormal electrocardiogram [ECG] [EKG]: Secondary | ICD-10-CM | POA: Diagnosis not present

## 2023-04-12 DIAGNOSIS — E860 Dehydration: Secondary | ICD-10-CM | POA: Diagnosis not present

## 2023-04-12 DIAGNOSIS — R109 Unspecified abdominal pain: Secondary | ICD-10-CM | POA: Diagnosis not present

## 2023-04-12 DIAGNOSIS — R739 Hyperglycemia, unspecified: Secondary | ICD-10-CM | POA: Insufficient documentation

## 2023-04-12 DIAGNOSIS — Z515 Encounter for palliative care: Secondary | ICD-10-CM | POA: Diagnosis not present

## 2023-04-12 LAB — URINALYSIS, ROUTINE W REFLEX MICROSCOPIC
Bacteria, UA: NONE SEEN
Bilirubin Urine: NEGATIVE
Glucose, UA: >=500 mg/dL — AB
Ketones, ur: 20 mg/dL — AB
Leukocytes,Ua: NEGATIVE
Nitrite: NEGATIVE
Protein, ur: 30 mg/dL — AB
Specific Gravity, Urine: 1.022 (ref 1.005–1.030)
pH: 5 (ref 5.0–8.0)

## 2023-04-12 LAB — CBC
HCT: 40.2 % (ref 39.0–52.0)
Hemoglobin: 13 g/dL (ref 13.0–17.0)
MCH: 28.4 pg (ref 26.0–34.0)
MCHC: 32.3 g/dL (ref 30.0–36.0)
MCV: 88 fL (ref 80.0–100.0)
Platelets: 187 10*3/uL (ref 150–400)
RBC: 4.57 MIL/uL (ref 4.22–5.81)
RDW: 13.1 % (ref 11.5–15.5)
WBC: 7.9 10*3/uL (ref 4.0–10.5)
nRBC: 0 % (ref 0.0–0.2)

## 2023-04-12 LAB — BASIC METABOLIC PANEL
Anion gap: 12 (ref 5–15)
BUN: 26 mg/dL — ABNORMAL HIGH (ref 8–23)
CO2: 28 mmol/L (ref 22–32)
Calcium: 9.3 mg/dL (ref 8.9–10.3)
Chloride: 93 mmol/L — ABNORMAL LOW (ref 98–111)
Creatinine, Ser: 0.94 mg/dL (ref 0.61–1.24)
GFR, Estimated: >60 mL/min (ref >60)
Glucose, Bld: 229 mg/dL — ABNORMAL HIGH (ref 70–99)
Potassium: 4.7 mmol/L (ref 3.5–5.1)
Sodium: 133 mmol/L — ABNORMAL LOW (ref 135–145)

## 2023-04-12 LAB — LIPASE, BLOOD: Lipase: 20 U/L (ref 11–51)

## 2023-04-12 LAB — CBG MONITORING, ED: Glucose-Capillary: 239 mg/dL — ABNORMAL HIGH (ref 70–99)

## 2023-04-12 MED ORDER — LACTATED RINGERS IV BOLUS
2000.0000 mL | Freq: Once | INTRAVENOUS | Status: AC
  Administered 2023-04-12: 2000 mL via INTRAVENOUS

## 2023-04-12 MED ORDER — ACETAMINOPHEN 500 MG PO TABS
1000.0000 mg | ORAL_TABLET | Freq: Once | ORAL | Status: AC
  Administered 2023-04-12: 1000 mg via ORAL
  Filled 2023-04-12: qty 2

## 2023-04-12 MED ORDER — MORPHINE SULFATE (PF) 2 MG/ML IV SOLN
2.0000 mg | Freq: Once | INTRAVENOUS | Status: AC
  Administered 2023-04-12: 2 mg via INTRAVENOUS
  Filled 2023-04-12: qty 1

## 2023-04-12 MED ORDER — ONDANSETRON HCL 4 MG/2ML IJ SOLN
4.0000 mg | Freq: Once | INTRAMUSCULAR | Status: AC
  Administered 2023-04-12: 4 mg via INTRAVENOUS
  Filled 2023-04-12: qty 2

## 2023-04-12 NOTE — ED Provider Notes (Signed)
[facility] Provider Note    Event Date/Time   First MD Initiated Contact with Patient 04/12/23 2314     (approximate)   History   Weakness and Abdominal Pain   HPI  Brian Bass is a 82 y.o. male who presents to the ED for evaluation of Weakness and Abdominal Pain   Daughter weeks patient to the ED for evaluation of nausea, vomiting and abdominal discomfort.  Patient is on home hospice, daughter lives separately and patient has caregivers at home.   Patient reports he has been sick for the past 1 day with recurrent emesis, reports some mild abdominal discomfort but largely he is requesting fluids for his emesis.  Physical Exam   Triage Vital Signs: ED Triage Vitals  Enc Vitals Group     BP 04/12/23 1957 (!) 163/89     Pulse Rate 04/12/23 1957 86     Resp 04/12/23 1957 (!) 22     Temp 04/12/23 1957 (!) 97.3 F (36.3 C)     Temp Source 04/12/23 1957 Axillary     SpO2 04/12/23 1957 100 %     Weight 04/12/23 1954 130 lb (59 kg)     Height 04/12/23 1954 5\' 5"  (1.651 m)     Head Circumference --      Peak Flow --      Pain Score 04/12/23 1954 4     Pain Loc --      Pain Edu? --      Excl. in GC? --     Most recent vital signs: Vitals:   04/13/23 0030 04/13/23 0339  BP: (!) 162/87 (!) 150/93  Pulse: 85 97  Resp: 14 16  Temp:  97.9 F (36.6 C)  SpO2: 100% 100%    General: Awake, no distress.  Frail and chronically ill-appearing, dry mucous membranes CV:  Good peripheral perfusion.  Resp:  Normal effort.  Abd:  No distention. Soft, no peritoneal features. Mild diffuse TTP MSK:  No deformity noted.  Neuro:  No focal deficits appreciated. Other:     ED Results / Procedures / Treatments   Labs (all labs ordered are listed, but only abnormal results are displayed) Labs Reviewed  BASIC METABOLIC PANEL - Abnormal; Notable for the following components:      Result Value   Sodium 133 (*)    Chloride 93 (*)    Glucose, Bld 229 (*)     BUN 26 (*)    All other components within normal limits  URINALYSIS, ROUTINE W REFLEX MICROSCOPIC - Abnormal; Notable for the following components:   Color, Urine YELLOW (*)    APPearance CLEAR (*)    Glucose, UA >=500 (*)    Hgb urine dipstick SMALL (*)    Ketones, ur 20 (*)    Protein, ur 30 (*)    All other components within normal limits  CBG MONITORING, ED - Abnormal; Notable for the following components:   Glucose-Capillary 239 (*)    All other components within normal limits  CBC  LIPASE, BLOOD  CBG MONITORING, ED    EKG Sinus rhythm with a rate of 86 bpm.  Normal axis and apparently normal intervals.  Poor quality EKG.  No clear STEMI.  RADIOLOGY CT abdomen/pelvis interpreted by me without evidence of SBO.  Official radiology report(s): CT ABDOMEN PELVIS W CONTRAST  Result Date: 04/13/2023 CLINICAL DATA:  Abdominal pain with nausea and vomiting, initial encounter EXAM: CT ABDOMEN AND PELVIS WITH CONTRAST TECHNIQUE: Multidetector  CT imaging of the abdomen and pelvis was performed using the standard protocol following bolus administration of intravenous contrast. RADIATION DOSE REDUCTION: This exam was performed according to the departmental dose-optimization program which includes automated exposure control, adjustment of the mA and/or kV according to patient size and/or use of iterative reconstruction technique. CONTRAST:  OMNIPAQUE IOHEXOL 300 MG/ML  SOLN COMPARISON:  09/12/2022 FINDINGS: Lower chest: Chronic emphysematous changes are noted. No focal infiltrate or effusion is seen. Distal esophagus demonstrates some wall thickening likely related to reflux. Hepatobiliary: No focal liver abnormality is seen. No gallstones, gallbladder wall thickening, or biliary dilatation. Pancreas: Unremarkable. No pancreatic ductal dilatation or surrounding inflammatory changes. Spleen: Normal in size without focal abnormality. Adrenals/Urinary Tract: Adrenal glands are within normal  limits. Kidneys demonstrate a normal enhancement pattern bilaterally. The bladder is well distended. Stomach/Bowel: Fecal material is noted scattered throughout the colon. Diverticular changes noted without evidence of diverticulitis. The appendix is not discretely visualized. No inflammatory changes to suggest appendicitis are noted. Stomach is distended with fluid. No small bowel obstructive changes are seen. Vascular/Lymphatic: Atherosclerotic calcifications of the abdominal aorta are noted. Focal dilatation to 3.2 cm is noted with mural thrombus noted. No dissection is seen. No significant lymphadenopathy is noted. Reproductive: Prostate is unremarkable. Other: No abdominal wall hernia or abnormality. No abdominopelvic ascites. Musculoskeletal: No acute or significant osseous findings. Multiple old rib fractures are noted with nonunion. IMPRESSION: Diverticulosis without diverticulitis. Abdominal aortic aneurysm measuring 3.2 cm. Recommend follow-up every 3 years. Reference: J Am Coll Radiol 2013;10:789-794. Thickening in the distal esophagus likely related to reflux. Electronically Signed   By: Alcide Clever M.D.   On: 04/13/2023 02:46    PROCEDURES and INTERVENTIONS:  .1-3 Lead EKG Interpretation  Performed by: Delton Prairie, MD Authorized by: Delton Prairie, MD     Interpretation: normal     ECG rate:  90   ECG rate assessment: normal     Rhythm: sinus rhythm     Ectopy: none     Conduction: normal     Medications  acetaminophen (TYLENOL) tablet 1,000 mg (1,000 mg Oral Given 04/12/23 2344)  morphine (PF) 2 MG/ML injection 2 mg (2 mg Intravenous Given 04/12/23 2351)  ondansetron (ZOFRAN) injection 4 mg (4 mg Intravenous Given 04/12/23 2351)  lactated ringers bolus 2,000 mL (0 mLs Intravenous Stopped 04/13/23 0136)  iohexol (OMNIPAQUE) 300 MG/ML solution 100 mL (100 mLs Intravenous Contrast Given 04/13/23 0201)  famotidine (PEPCID) IVPB 20 mg premix (0 mg Intravenous Stopped 04/13/23 0338)      IMPRESSION / MDM / ASSESSMENT AND PLAN / ED COURSE  I reviewed the triage vital signs and the nursing notes.  Differential diagnosis includes, but is not limited to, dehydration, pancreatitis, SBO, UTI  {Patient presents with symptoms of an acute illness or injury that is potentially life-threatening.  Home hospice patient presents with recurrent emesis and signs of dehydration.  Diffuse and mild abdominal tenderness without peritoneal features.  Normal CBC.  Metabolic panel with hyperglycemia without acidosis to suggest DKA.  Normal lipase.  Urine without infectious features but with ketones suggestive of dehydration.  We will initiate symptomatic control, antiemetics and IV fluids. I considered cross-sectional imaging of the abdomen but when discussing with daughter and patient, we decided not to considering his home hospice care.  As below, we did end up doing CT imaging which shows some stigmata of esophagitis but no other acute issues.  He is feeling better after fluid resuscitation, antiemetics and H2 blocker.  Tolerating p.o. intake and suitable for outpatient management.  Discharged with Zofran.  Clinical Course as of 04/13/23 0431  Thu Apr 13, 2023  0012 Reassessed.  Started to feel better [DS]  0143 Reassessed and again discussed plan of care.  Radiology for monitoring patient is requesting a CT scan. [DS]  0258 Reassessed.  Patient with feeling better.  We discussed CT results and signs of esophagitis but otherwise reassuring CT imaging.  We discussed likely going home with home hospice and they are agreeable.  He is asking for something to drink [DS]    Clinical Course User Index [DS] Delton Prairie, MD     FINAL CLINICAL IMPRESSION(S) / ED DIAGNOSES   Final diagnoses:  Nausea and vomiting, unspecified vomiting type  Hospice care patient  Dehydration  Esophagitis     Rx / DC Orders   ED Discharge Orders          Ordered    ondansetron (ZOFRAN-ODT) 4 MG  disintegrating tablet  Every 8 hours PRN        04/13/23 0317             Note:  This document was prepared using Dragon voice recognition software and may include unintentional dictation errors.   Delton Prairie, MD 04/13/23 [phone]

## 2023-04-12 NOTE — ED Notes (Signed)
ED providers messaged at pt request, requesting pain medication.

## 2023-04-12 NOTE — ED Notes (Signed)
5 warm blankets applied to pt

## 2023-04-12 NOTE — ED Triage Notes (Signed)
EMS called out by hospice nurse d/t pt "not feeling well." Pt from home with DNR form. On EMS arrival they found that pt was slightly hyperglycemic. Pt alert and oriented on arrival. 6L [place] in use that pt uses at baseline. Pt reports abd pain. Pt breathing with symmetric chest rise and fall.   BGL 264 500 or LR given and BGL decreased to 209 96.6 axillary  RR 23 HR 97-104 130/79  CBG 239 in triage

## 2023-04-13 ENCOUNTER — Emergency Department: Payer: Medicare Other

## 2023-04-13 DIAGNOSIS — Z7401 Bed confinement status: Secondary | ICD-10-CM | POA: Diagnosis not present

## 2023-04-13 DIAGNOSIS — R531 Weakness: Secondary | ICD-10-CM | POA: Diagnosis not present

## 2023-04-13 DIAGNOSIS — R109 Unspecified abdominal pain: Secondary | ICD-10-CM | POA: Diagnosis not present

## 2023-04-13 DIAGNOSIS — R1110 Vomiting, unspecified: Secondary | ICD-10-CM | POA: Diagnosis not present

## 2023-04-13 MED ORDER — IOHEXOL 300 MG/ML  SOLN
100.0000 mL | Freq: Once | INTRAMUSCULAR | Status: AC | PRN
  Administered 2023-04-13: 100 mL via INTRAVENOUS

## 2023-04-13 MED ORDER — FAMOTIDINE IN NACL 20-0.9 MG/50ML-% IV SOLN
20.0000 mg | Freq: Once | INTRAVENOUS | Status: AC
  Administered 2023-04-13: 20 mg via INTRAVENOUS
  Filled 2023-04-13: qty 50

## 2023-04-13 MED ORDER — ONDANSETRON 4 MG PO TBDP: 4.0000 mg | ORAL_TABLET | Freq: Three times a day (TID) | ORAL | 0 refills | Status: AC | PRN

## 2023-04-13 NOTE — Discharge Instructions (Signed)
I sent a prescription for Zofran nausea medicine to the pharmacy that you can use as needed at home for any further nausea and vomiting.

## 2023-04-13 NOTE — ED Notes (Signed)
called to [facility] for pt transport home/rep:madelyn.

## 2023-04-20 ENCOUNTER — Telehealth: Payer: Self-pay | Admitting: *Deleted

## 2023-04-20 NOTE — Telephone Encounter (Signed)
Transition Care Management Unsuccessful Follow-up Telephone Call  Date of discharge and from where:  [facility] 04/13/2023  Attempts:  1st Attempt  Reason for unsuccessful TCM follow-up call:  Patient did not want to talk to me said no thank you

## 2023-05-18 ENCOUNTER — Ambulatory Visit (INDEPENDENT_AMBULATORY_CARE_PROVIDER_SITE_OTHER): Payer: Medicare HMO | Admitting: Family Medicine

## 2023-05-18 ENCOUNTER — Encounter: Payer: Self-pay | Admitting: Family Medicine

## 2023-05-18 VITALS — BP 108/70 | HR 94 | Temp 97.3°F | Ht 66.0 in

## 2023-05-18 DIAGNOSIS — J849 Interstitial pulmonary disease, unspecified: Secondary | ICD-10-CM

## 2023-05-18 DIAGNOSIS — R519 Headache, unspecified: Secondary | ICD-10-CM | POA: Diagnosis not present

## 2023-05-18 DIAGNOSIS — K6289 Other specified diseases of anus and rectum: Secondary | ICD-10-CM

## 2023-05-18 MED ORDER — ACETAMINOPHEN 500 MG PO TABS
500.0000 mg | ORAL_TABLET | Freq: Once | ORAL | Status: AC
Start: 2023-05-18 — End: 2023-05-18
  Administered 2023-05-18: 500 mg via ORAL

## 2023-05-18 MED ORDER — HYDROCORT-PRAMOXINE (PERIANAL) 2.5-1 % EX CREA: TOPICAL_CREAM | Freq: Three times a day (TID) | CUTANEOUS | 0 refills | Status: AC

## 2023-05-18 MED ORDER — HYDROCORTISONE ACETATE 25 MG RE SUPP: 25.0000 mg | Freq: Every day | RECTAL | 0 refills | Status: AC

## 2023-05-18 NOTE — Assessment & Plan Note (Signed)
Acute, no thrombosed external hemorrhoids seen on exam. Internal hemorrhoid not visualized, rectal exam nontender.   Given stool blocking complete exam of rectal passage, internal hemorrhoid may be present. Will start with rectal steroid suppositories but given pain will also try prescription of pramoxine numbing medication. If symptoms or not improving with this we can consider endoscopy or referral to GI.  Consider lidocaine patches for possible coccygeal pain as possible cause of symptoms. Patient denies constipation.

## 2023-05-18 NOTE — Assessment & Plan Note (Signed)
Chronic, unclear etiology but no red flags for new injury. Tylenol 500 mg p.o. x 1 given while in office and seem to help symptoms before he left the office.

## 2023-05-18 NOTE — Assessment & Plan Note (Signed)
Chronic, progressive, on continuous oxygen

## 2023-05-18 NOTE — Progress Notes (Signed)
Patient ID: Brian Bass, male    DOB: 1941-01-08, 82 y.o.   MRN: [id number]  This visit was conducted in person.  BP 108/70   Pulse 94   Temp (!) 97.3 F (36.3 C) (Temporal)   Ht 5\' 6"  (1.676 m)   SpO2 100% Comment: 6 L  BMI 20.98 kg/m    CC:  Chief Complaint  Patient presents with   Hemorrhoids    C/o painful hemorrhoids and HA.     Subjective:   HPI: Brian Bass is a 82 y.o. male presenting on 05/18/2023 for Hemorrhoids (C/o painful hemorrhoids and HA. )  On hospice for interstitial lung disease with oxygen requirement.   Chronic headache and other pain treated with Morphine per hospice. Patient did note headache in office today and was given Tylenol which seem to improve symptoms before he left the office.  No recent falls.    In the last month he has had sharp shooting intermittent pains in rectal area.  Per patient and son-in-law with him at the appointment today this was observed to be a thrombosed hemorrhoid by the hospice nurse.  He has been applying Preparation H without any relief.  He does have a history of decubitus ulcer on buttocks   Relevant past medical, surgical, family and social history reviewed and updated as indicated. Interim medical history since our last visit reviewed. Allergies and medications reviewed and updated. Outpatient Medications Prior to Visit  Medication Sig Dispense Refill   ACCU-CHEK FASTCLIX LANCETS MISC 1 Device by Other route 3 (three) times daily. Check blood sugars 2 to 3 times daily.  Dx: 250.02 102 each 5   ACCU-CHEK SMARTVIEW test strip TEST BLOOD SUGAR 2 TO 3 TIMES DAILY 300 strip 3   benzonatate (TESSALON) 200 MG capsule Take 1 capsule (200 mg total) by mouth 3 (three) times daily as needed for cough. 40 capsule 1   Blood Glucose Monitoring Suppl (ACCU-CHEK NANO SMARTVIEW) W/DEVICE KIT 1 kit by Other route 3 (three) times daily. Use to check blood sugars 2 to 3 times daily.  Dx: 250.02 1 kit 0    Budeson-Glycopyrrol-Formoterol (BREZTRI AEROSPHERE) 160-9-4.8 MCG/ACT AERO Inhale 2 puffs into the lungs in the morning and at bedtime. 10.7 g 11   cephALEXin (KEFLEX) 250 MG capsule Take 500 mg by mouth 2 (two) times daily.     ezetimibe (ZETIA) 10 MG tablet Take 1 tablet (10 mg total) by mouth daily. 90 tablet 3   gabapentin (NEURONTIN) 300 MG capsule Take 1 capsule (300 mg total) by mouth at bedtime as needed (cough). 30 capsule 0   glipiZIDE (GLIPIZIDE XL) 10 MG 24 hr tablet Take 1 tablet (10 mg total) by mouth daily with breakfast. 30 tablet 11   Insulin Syringe-Needle U-100 (DROPLET INSULIN SYRINGE) 31G X 5/16" 0.3 ML MISC USE TO INJECT LANTUS INSULIN 2 TIMES DAILY 200 each 3   LANTUS 100 UNIT/ML injection INJECT 55 UNITS SQ TWICE A DAY (Patient taking differently: 55 Units daily.) 100 mL 0   mirtazapine (REMERON) 30 MG tablet Take 1 tablet (30 mg total) by mouth at bedtime. 30 tablet 11   MITIGARE 0.6 MG CAPS 1 tab po daily prn gout flare 60 capsule 0   Multiple Vitamin (MULTIVITAMIN) tablet Take 1 tablet by mouth daily.     ondansetron (ZOFRAN) 4 MG tablet Take 1 tablet (4 mg total) by mouth 3 (three) times daily as needed for nausea or vomiting. TAKE ONE TABLET BY MOUTH EVERY  DAY AS NEEDED FOR NAUSEA 60 tablet 2   ondansetron (ZOFRAN-ODT) 4 MG disintegrating tablet Take 1 tablet (4 mg total) by mouth every 8 (eight) hours as needed. 20 tablet 0   rOPINIRole (REQUIP) 0.25 MG tablet 1 tablet at bedtime if tolerated but not effective after 3-4 days, increase to 2 tabs then if tolerated but not effective after 3-4  more days, then  increase to 3 tabs  at bedtime, if still not effective call. 30 tablet 11   saccharomyces boulardii (FLORASTOR) 250 MG capsule Take 1 capsule (250 mg total) by mouth 2 (two) times daily. 60 capsule 11   tamsulosin (FLOMAX) 0.4 MG CAPS capsule TAKE 2 CAPSULES EVERY DAY 60 capsule 11   No facility-administered medications prior to visit.     Per HPI unless  specifically indicated in ROS section below Review of Systems  Constitutional:  Negative for fatigue and fever.  HENT:  Negative for ear pain.   Eyes:  Negative for pain.  Respiratory:  Positive for shortness of breath. Negative for cough.   Cardiovascular:  Negative for chest pain, palpitations and leg swelling.  Gastrointestinal:  Negative for abdominal pain.  Genitourinary:  Negative for dysuria.  Musculoskeletal:  Negative for arthralgias.  Neurological:  Negative for syncope, light-headedness and headaches.  Psychiatric/Behavioral:  Negative for dysphoric mood.    Objective:  BP 108/70   Pulse 94   Temp (!) 97.3 F (36.3 C) (Temporal)   Ht 5\' 6"  (1.676 m)   SpO2 100% Comment: 6 L  BMI 20.98 kg/m   Wt Readings from Last 3 Encounters:  04/12/23 130 lb (59 kg)  09/11/22 130 lb (59 kg)  01/11/22 136 lb 3.2 oz (61.8 kg)      Physical Exam Constitutional:      Appearance: He is cachectic. He is ill-appearing.  Abdominal:     General: Abdomen is flat. Bowel sounds are normal.     Palpations: Abdomen is soft.     Tenderness: There is no abdominal tenderness.  Genitourinary:    Rectum: Guaiac result negative. No mass, tenderness, anal fissure, external hemorrhoid or internal hemorrhoid. Abnormal anal tone.     Comments: Grade 1 decubitus ulcer under bandage on left upper buttock, nontender      Results for orders placed or performed during the hospital encounter of 04/12/23  Basic metabolic panel  Result Value Ref Range   Sodium 133 (L) 135 - 145 mmol/L   Potassium 4.7 3.5 - 5.1 mmol/L   Chloride 93 (L) 98 - 111 mmol/L   CO2 28 22 - 32 mmol/L   Glucose, Bld 229 (H) 70 - 99 mg/dL   BUN 26 (H) 8 - 23 mg/dL   Creatinine, Ser 1.66 0.61 - 1.24 mg/dL   Calcium 9.3 8.9 - 06.3 mg/dL   GFR, Estimated >01 >60 mL/min   Anion gap 12 5 - 15  CBC  Result Value Ref Range   WBC 7.9 4.0 - 10.5 K/uL   RBC 4.57 4.22 - 5.81 MIL/uL   Hemoglobin 13.0 13.0 - 17.0 g/dL   HCT 10.9 32.3  - 55.7 %   MCV 88.0 80.0 - 100.0 fL   MCH 28.4 26.0 - 34.0 pg   MCHC 32.3 30.0 - 36.0 g/dL   RDW 32.2 02.5 - 42.7 %   Platelets 187 150 - 400 K/uL   nRBC 0.0 0.0 - 0.2 %  Urinalysis, Routine w reflex microscopic -Urine, Clean Catch  Result Value Ref Range   Color,  Urine YELLOW (A) YELLOW   APPearance CLEAR (A) CLEAR   Specific Gravity, Urine 1.022 1.005 - 1.030   pH 5.0 5.0 - 8.0   Glucose, UA >=500 (A) NEGATIVE mg/dL   Hgb urine dipstick SMALL (A) NEGATIVE   Bilirubin Urine NEGATIVE NEGATIVE   Ketones, ur 20 (A) NEGATIVE mg/dL   Protein, ur 30 (A) NEGATIVE mg/dL   Nitrite NEGATIVE NEGATIVE   Leukocytes,Ua NEGATIVE NEGATIVE   RBC / HPF 0-5 0 - 5 RBC/hpf   WBC, UA 0-5 0 - 5 WBC/hpf   Bacteria, UA NONE SEEN NONE SEEN   Squamous Epithelial / HPF 0-5 0 - 5 /HPF   Mucus PRESENT   Lipase, blood  Result Value Ref Range   Lipase 20 11 - 51 U/L  CBG monitoring, ED  Result Value Ref Range   Glucose-Capillary 239 (H) 70 - 99 mg/dL    Assessment and Plan  Nonintractable headache, unspecified chronicity pattern, unspecified headache type Assessment & Plan: Chronic, unclear etiology but no red flags for new injury. Tylenol 500 mg p.o. x 1 given while in office and seem to help symptoms before he left the office.  Orders: -     Acetaminophen  ILD (interstitial lung disease) (HCC) Assessment & Plan: Chronic, progressive, on continuous oxygen   Rectal pain Assessment & Plan: Acute, no thrombosed external hemorrhoids seen on exam. Internal hemorrhoid not visualized, rectal exam nontender.   Given stool blocking complete exam of rectal passage, internal hemorrhoid may be present. Will start with rectal steroid suppositories but given pain will also try prescription of pramoxine numbing medication. If symptoms or not improving with this we can consider endoscopy or referral to GI.  Consider lidocaine patches for possible coccygeal pain as possible cause of symptoms. Patient denies  constipation.    Other orders -     Hydrocortisone Acetate; Place 1 suppository (25 mg total) rectally daily.  Dispense: 6 suppository; Refill: 0 -     Hydrocort-Pramoxine (Perianal); Place rectally 3 (three) times daily.  Dispense: 30 g; Refill: 0    No follow-ups on file.   Kerby Nora, MD

## 2024-09-10 NOTE — Progress Notes (Signed)
 Assessment & Plan:  1. IPF (idiopathic pulmonary fibrosis) (HCC) (Primary) Comments: Not tolerating Ofev well Decrease Ofev to once a day - Comp Met (CMET); Future - CBC w/Diff; Future  2. Cough Comments: Trial of gabapentin  100 mg twice a day Continue as needed Tessalon  Continue Stiolto  3. Transaminitis Comments: Decrease Ofev to once daily Repeat c-Met in 2 weeks Zofran  as needed for nausea   Patient Instructions  What we discussed today:  We will need blood work drawn in 2 weeks this has been sent to the lab Decrease Ofev to once daily We have refilled Zofran  for a amount, if you need this medication any further contact your primary physician You will start Neurontin  1 capsule twice a day if this is not helping your cough you may increase to 3 times a day but do let us  know how this is working for you it may take several days to a week for it to start showing effect Continue using your Stiolto We will see you in follow-up in 4 to 6 weeks time call sooner should any new problems arise  Please note: late entry documentation due to logistical difficulties during COVID-19 pandemic. This note is filed for information purposes only, and is not intended to be used for billing, nor does it represent the full scope/nature of the visit in question. Please see any associated scanned media linked to date of encounter for additional pertinent information.  Subjective:    HPI: Brian Bass is a 83 y.o. male presenting to the pulmonology clinic on 05/28/2020 with report of: Follow-up (pt reports of non prod cough and sob with exertion)     Outpatient Encounter Medications as of 05/28/2020  Medication Sig Note   ACCU-CHEK FASTCLIX LANCETS MISC 1 Device by Other route 3 (three) times daily. Check blood sugars 2 to 3 times daily.  Dx: 250.02    ACCU-CHEK SMARTVIEW test strip TEST BLOOD SUGAR 2 TO 3 TIMES DAILY    Blood Glucose Monitoring Suppl (ACCU-CHEK NANO SMARTVIEW)  W/DEVICE KIT 1 kit by Other route 3 (three) times daily. Use to check blood sugars 2 to 3 times daily.  Dx: 250.02    Multiple Vitamin (MULTIVITAMIN) tablet Take 1 tablet by mouth daily.    [DISCONTINUED] amoxicillin -clavulanate (AUGMENTIN ) 875-125 MG tablet Take 1 tablet by mouth 2 (two) times daily.    [DISCONTINUED] aspirin 81 MG tablet Take 81 mg by mouth daily. (Patient not taking: Reported on 12/10/2020)    [DISCONTINUED] BD INSULIN  SYRINGE U/F 31G X 5/16 1 ML MISC USE TO INJECT LANTUS  INSULIN  2 TIMES DAILY    [DISCONTINUED] benzonatate  (TESSALON ) 100 MG capsule TAKE 2 CAPSULES 3 TIMES DAILY    [DISCONTINUED] cholestyramine  light (PREVALITE ) 4 g packet Take 1 packet (4 g total) by mouth 2 (two) times daily as needed (Diarrhea).    [DISCONTINUED] ezetimibe  (ZETIA ) 10 MG tablet TAKE 1 TABLET BY MOUTH DAILY (Patient not taking: Reported on 08/24/2021)    [DISCONTINUED] fenofibrate  160 MG tablet Take 1 tablet (160 mg total) by mouth daily.    [DISCONTINUED] glipiZIDE  (GLUCOTROL ) 10 MG tablet TAKE ONE TABLET BY MOUTH TWICE A DAY BEFORE A MEAL    [DISCONTINUED] insulin  glargine (LANTUS ) 100 UNIT/ML injection Inject 70 Units into the skin daily. 08/13/2019: Reports usually takes 70- 100 Units q HS; now taking mainly 70 Units q HS   [DISCONTINUED] MITIGARE  0.6 MG CAPS FOR FLARE TAKE 2 TABLETS ONCE THEN REPEAT 1 TABLET IN 2 HOURS IF PAINCONTINUES, THEN  1 TABLET DAILY    [DISCONTINUED] Nintedanib (OFEV) 150 MG CAPS Take 150 mg by mouth 2 (two) times daily.    [DISCONTINUED] omeprazole  (PRILOSEC) 20 MG capsule TAKE 1 CAPSULE EVERY DAY    [DISCONTINUED] ondansetron  (ZOFRAN -ODT) 4 MG disintegrating tablet TAKE ONE TABLET EVERY EIGHT HOURS AS NEEDED FOR NAUSEA / VOMITING    [DISCONTINUED] simvastatin  (ZOCOR ) 40 MG tablet TAKE 1 TABLET AT BEDTIME    [DISCONTINUED] tamsulosin  (FLOMAX ) 0.4 MG CAPS capsule TAKE 1 CAPSULE BY MOUTH DAILY. IF NOT IMPROVING, INCREASE TO 2 TABLETS DAILY    [DISCONTINUED] Tiotropium  Bromide-Olodaterol (STIOLTO RESPIMAT ) 2.5-2.5 MCG/ACT AERS Inhale 2 puffs into the lungs daily.    [DISCONTINUED] gabapentin  (NEURONTIN ) 100 MG capsule Take 1 capsule (100 mg total) by mouth 2 (two) times daily.    [DISCONTINUED] ondansetron  (ZOFRAN ) 4 MG tablet Take 1 tablet (4 mg total) by mouth daily as needed for nausea or vomiting.    No facility-administered encounter medications on file as of 05/28/2020.      Objective:   Vitals:   05/28/20 1409  BP: 128/72  Pulse: 85  Temp: 97.8 F (36.6 C)  Height: 5' 1 (1.549 m)  Weight: 158 lb (71.7 kg)  SpO2: 92%  TempSrc: Temporal  BMI (Calculated): 29.87     Physical exam documentation is limited by delayed entry of information.
